# Patient Record
Sex: Female | Born: 1960 | Race: White | Hispanic: No | Marital: Married | Smoking: Never smoker
Health system: Southern US, Community
[De-identification: ages and names within clinical notes are randomized; demographics above are authoritative.]

## PROBLEM LIST (undated history)

## (undated) DIAGNOSIS — G43909 Migraine, unspecified, not intractable, without status migrainosus: Secondary | ICD-10-CM

## (undated) DIAGNOSIS — H269 Unspecified cataract: Secondary | ICD-10-CM

## (undated) DIAGNOSIS — R112 Nausea with vomiting, unspecified: Secondary | ICD-10-CM

## (undated) DIAGNOSIS — Z9889 Other specified postprocedural states: Secondary | ICD-10-CM

## (undated) DIAGNOSIS — M1990 Unspecified osteoarthritis, unspecified site: Secondary | ICD-10-CM

## (undated) DIAGNOSIS — J45909 Unspecified asthma, uncomplicated: Secondary | ICD-10-CM

## (undated) DIAGNOSIS — F32A Depression, unspecified: Secondary | ICD-10-CM

## (undated) DIAGNOSIS — Z5189 Encounter for other specified aftercare: Secondary | ICD-10-CM

## (undated) DIAGNOSIS — R4586 Emotional lability: Secondary | ICD-10-CM

## (undated) DIAGNOSIS — T7840XA Allergy, unspecified, initial encounter: Secondary | ICD-10-CM

## (undated) HISTORY — PX: APPENDECTOMY: SHX54

## (undated) HISTORY — PX: ABDOMINAL HYSTERECTOMY: SHX81

## (undated) HISTORY — DX: Unspecified asthma, uncomplicated: J45.909

## (undated) HISTORY — DX: Encounter for other specified aftercare: Z51.89

## (undated) HISTORY — DX: Unspecified cataract: H26.9

## (undated) HISTORY — PX: TOTAL ABDOMINAL HYSTERECTOMY: SHX209

## (undated) HISTORY — DX: Migraine, unspecified, not intractable, without status migrainosus: G43.909

## (undated) HISTORY — PX: TONSILLECTOMY: SUR1361

## (undated) HISTORY — PX: REPAIR ANKLE LIGAMENT: SUR1187

## (undated) HISTORY — DX: Emotional lability: R45.86

## (undated) HISTORY — DX: Allergy, unspecified, initial encounter: T78.40XA

## (undated) HISTORY — PX: ROTATOR CUFF REPAIR: SHX139

## (undated) HISTORY — PX: PARTIAL HYSTERECTOMY: SHX80

## (undated) HISTORY — DX: Depression, unspecified: F32.A

---

## 2010-11-29 HISTORY — PX: COLONOSCOPY: SHX174

## 2015-09-26 ENCOUNTER — Encounter: Payer: Self-pay | Admitting: Family Medicine

## 2015-09-26 ENCOUNTER — Ambulatory Visit (INDEPENDENT_AMBULATORY_CARE_PROVIDER_SITE_OTHER): Payer: 59 | Admitting: Family Medicine

## 2015-09-26 VITALS — BP 120/72 | HR 80 | Ht 64.0 in | Wt 185.0 lb

## 2015-09-26 DIAGNOSIS — G43909 Migraine, unspecified, not intractable, without status migrainosus: Secondary | ICD-10-CM | POA: Insufficient documentation

## 2015-09-26 DIAGNOSIS — M159 Polyosteoarthritis, unspecified: Secondary | ICD-10-CM

## 2015-09-26 DIAGNOSIS — E669 Obesity, unspecified: Secondary | ICD-10-CM | POA: Diagnosis not present

## 2015-09-26 DIAGNOSIS — G43009 Migraine without aura, not intractable, without status migrainosus: Secondary | ICD-10-CM | POA: Diagnosis not present

## 2015-09-26 DIAGNOSIS — M150 Primary generalized (osteo)arthritis: Secondary | ICD-10-CM

## 2015-09-26 DIAGNOSIS — Z803 Family history of malignant neoplasm of breast: Secondary | ICD-10-CM | POA: Diagnosis not present

## 2015-09-26 DIAGNOSIS — N951 Menopausal and female climacteric states: Secondary | ICD-10-CM

## 2015-09-26 DIAGNOSIS — J309 Allergic rhinitis, unspecified: Secondary | ICD-10-CM

## 2015-09-26 DIAGNOSIS — J4520 Mild intermittent asthma, uncomplicated: Secondary | ICD-10-CM

## 2015-09-26 DIAGNOSIS — E559 Vitamin D deficiency, unspecified: Secondary | ICD-10-CM | POA: Diagnosis not present

## 2015-09-26 DIAGNOSIS — K635 Polyp of colon: Secondary | ICD-10-CM | POA: Diagnosis not present

## 2015-09-26 DIAGNOSIS — Z1239 Encounter for other screening for malignant neoplasm of breast: Secondary | ICD-10-CM | POA: Diagnosis not present

## 2015-09-26 DIAGNOSIS — IMO0001 Reserved for inherently not codable concepts without codable children: Secondary | ICD-10-CM

## 2015-09-26 DIAGNOSIS — J3089 Other allergic rhinitis: Secondary | ICD-10-CM | POA: Insufficient documentation

## 2015-09-26 DIAGNOSIS — Z833 Family history of diabetes mellitus: Secondary | ICD-10-CM | POA: Diagnosis not present

## 2015-09-26 DIAGNOSIS — M7122 Synovial cyst of popliteal space [Baker], left knee: Secondary | ICD-10-CM | POA: Diagnosis not present

## 2015-09-26 DIAGNOSIS — Z8249 Family history of ischemic heart disease and other diseases of the circulatory system: Secondary | ICD-10-CM

## 2015-09-26 MED ORDER — MONTELUKAST SODIUM 10 MG PO TABS: 10.0000 mg | ORAL_TABLET | Freq: Every day | ORAL | Status: DC

## 2015-09-26 MED ORDER — PIRBUTEROL ACETATE 200 MCG/INH IN AERB: 2.0000 | INHALATION_SPRAY | Freq: Four times a day (QID) | RESPIRATORY_TRACT | Status: DC | PRN

## 2015-09-27 LAB — COMPREHENSIVE METABOLIC PANEL
A/G RATIO: 1.8 (ref 1.1–2.5)
ALK PHOS: 86 IU/L (ref 39–117)
ALT: 13 IU/L (ref 0–32)
AST: 12 IU/L (ref 0–40)
Albumin: 4.4 g/dL (ref 3.5–5.5)
BUN/Creatinine Ratio: 16 (ref 9–23)
BUN: 14 mg/dL (ref 6–24)
CHLORIDE: 103 mmol/L (ref 97–106)
CO2: 26 mmol/L (ref 18–29)
Calcium: 9.4 mg/dL (ref 8.7–10.2)
Creatinine, Ser: 0.85 mg/dL (ref 0.57–1.00)
GFR calc non Af Amer: 77 mL/min/{1.73_m2} (ref >59)
GFR, EST AFRICAN AMERICAN: 89 mL/min/{1.73_m2} (ref >59)
Globulin, Total: 2.4 g/dL (ref 1.5–4.5)
Glucose: 93 mg/dL (ref 65–99)
POTASSIUM: 3.9 mmol/L (ref 3.5–5.2)
Sodium: 142 mmol/L (ref 136–144)
TOTAL PROTEIN: 6.8 g/dL (ref 6.0–8.5)

## 2015-09-27 LAB — CBC
Hematocrit: 40.3 % (ref 34.0–46.6)
Hemoglobin: 13.5 g/dL (ref 11.1–15.9)
MCH: 29.3 pg (ref 26.6–33.0)
MCHC: 33.5 g/dL (ref 31.5–35.7)
MCV: 87 fL (ref 79–97)
PLATELETS: 325 10*3/uL (ref 150–379)
RBC: 4.61 x10E6/uL (ref 3.77–5.28)
RDW: 15.3 % (ref 12.3–15.4)
WBC: 8 10*3/uL (ref 3.4–10.8)

## 2015-09-27 LAB — LIPID PANEL
CHOLESTEROL TOTAL: 241 mg/dL — AB (ref 100–199)
Chol/HDL Ratio: 4.6 ratio units — ABNORMAL HIGH (ref 0.0–4.4)
HDL: 52 mg/dL (ref >39)
LDL Calculated: 130 mg/dL — ABNORMAL HIGH (ref 0–99)
Triglycerides: 296 mg/dL — ABNORMAL HIGH (ref 0–149)
VLDL Cholesterol Cal: 59 mg/dL — ABNORMAL HIGH (ref 5–40)

## 2015-09-27 LAB — VITAMIN D 25 HYDROXY (VIT D DEFICIENCY, FRACTURES): VIT D 25 HYDROXY: 26.7 ng/mL — AB (ref 30.0–100.0)

## 2015-09-27 LAB — HEMOGLOBIN A1C
ESTIMATED AVERAGE GLUCOSE: 123 mg/dL
Hgb A1c MFr Bld: 5.9 % — ABNORMAL HIGH (ref 4.8–5.6)

## 2015-09-27 LAB — TSH: TSH: 0.066 u[IU]/mL — AB (ref 0.450–4.500)

## 2015-09-29 ENCOUNTER — Telehealth: Payer: Self-pay

## 2015-09-29 DIAGNOSIS — N951 Menopausal and female climacteric states: Secondary | ICD-10-CM | POA: Insufficient documentation

## 2015-09-29 DIAGNOSIS — Z833 Family history of diabetes mellitus: Secondary | ICD-10-CM | POA: Insufficient documentation

## 2015-09-29 DIAGNOSIS — Z803 Family history of malignant neoplasm of breast: Secondary | ICD-10-CM | POA: Insufficient documentation

## 2015-09-29 DIAGNOSIS — M1990 Unspecified osteoarthritis, unspecified site: Secondary | ICD-10-CM | POA: Insufficient documentation

## 2015-09-29 MED ORDER — ALBUTEROL SULFATE HFA 108 (90 BASE) MCG/ACT IN AERS: 2.0000 | INHALATION_SPRAY | Freq: Four times a day (QID) | RESPIRATORY_TRACT | Status: DC | PRN

## 2015-09-29 NOTE — Telephone Encounter (Signed)
Albuterol MDI rx sent.

## 2015-09-29 NOTE — Addendum Note (Signed)
Addended by: Schuyler AmorPLONK, Emmamae Mcnamara on: 09/29/2015 03:32 PM   Modules accepted: Orders, Medications

## 2015-09-29 NOTE — Progress Notes (Signed)
Date:  09/26/2015   Name:  [name]   DOB:  June 18, 1961   MRN:  [id number]  PCP:  Schuyler Amor, MD    Chief Complaint: Establish Care; Asthma; and Knee Pain   History of Present Illness:  This is a 54 y.o. female with intermittent asthma worse during winter on Qvar 6 months/yr, Singulair, and prn Maxair which she uses rarely. Hx AR generally well controlled on Claritin. Hx migraines one every 3-6 months well controlled on Topamax and prn Maxalt, needs neurology referral for monitoring. On Celexa for menopausal sxs, working well. On vit D supplement for deficiency. Dx'd with flat polyp on colonoscopy 5 yrs ago, needs GI referral for repeat study per GI recommendation. Has cyst L knee x 4 months after injury that swells intermittently but causes minimal pain. Last tetanus imm 5-6 yrs ago, pnuemococcal imm last year, to get flu imm next week at work. Takes Tylenol bid for OA. Father died early 65's s/p CABG on dialysis. Uncle with DM, PGM with breast cancer, needs screening mammogram. Weight stable, exercises with dogs.  Review of Systems:  Review of Systems  Constitutional: Negative for fever and chills.  HENT: Negative for ear pain, sore throat and trouble swallowing.   Eyes: Negative for pain.  Respiratory: Negative for cough and shortness of breath.   Cardiovascular: Negative for chest pain and leg swelling.  Gastrointestinal: Negative for abdominal pain, diarrhea and constipation.  Endocrine: Negative for polyuria.  Genitourinary: Negative for difficulty urinating.  Neurological: Negative for syncope and light-headedness.    Patient Active Problem List   Diagnosis Date Noted  . Osteoarthritis 09/29/2015  . FH: breast cancer 09/29/2015  . FH: diabetes mellitus 09/29/2015  . Menopausal syndrome (hot flashes) 09/29/2015  . Obesity, Class I, BMI 30-34.9 09/26/2015  . FH: heart disease 09/26/2015  . Vitamin D deficiency 09/26/2015  . Migraines 09/26/2015  . Moderate  intermittent asthma without complication 09/26/2015  . Colon polyp 09/26/2015  . Synovial cyst of left knee 09/26/2015  . Allergic rhinitis 09/26/2015    Prior to Admission medications   Medication Sig Start Date End Date Taking? Authorizing Provider  acetaminophen (TYLENOL) 650 MG CR tablet Take 650 mg by mouth 2 (two) times daily.   Yes Historical Provider, MD  beclomethasone (QVAR) 40 MCG/ACT inhaler Inhale 1 puff into the lungs 2 (two) times daily.   Yes Historical Provider, MD  citalopram (CELEXA) 20 MG tablet Take 20 mg by mouth daily.   Yes Historical Provider, MD  loratadine (CLARITIN) 10 MG tablet Take 10 mg by mouth daily.   Yes Historical Provider, MD  montelukast (SINGULAIR) 10 MG tablet Take 1 tablet (10 mg total) by mouth at bedtime. 09/26/15  Yes Schuyler Amor, MD  Multiple Vitamins-Minerals (MULTIVITAMIN ADULTS 50+ PO) Take 1 capsule by mouth daily.   Yes Historical Provider, MD  pirbuterol (MAXAIR) 200 MCG/INH inhaler Inhale 2 puffs into the lungs 4 (four) times daily as needed for wheezing. 09/26/15  Yes Schuyler Amor, MD  rizatriptan (MAXALT-MLT) 10 MG disintegrating tablet Take 10 mg by mouth as needed for migraine. May repeat in 2 hours if needed   Yes Historical Provider, MD  topiramate (TOPAMAX) 50 MG tablet Take 50 mg by mouth 2 (two) times daily.   Yes Historical Provider, MD  Vitamin D, Cholecalciferol, 1000 UNITS CAPS Take 1 capsule by mouth daily.   Yes Historical Provider, MD    No Known Allergies  Past Surgical History  Procedure Laterality Date  . Partial  hysterectomy      one ovary left  . Appendectomy    . Tonsillectomy    . Cesarean section      x 1  . Colonoscopy  2012    due in 2017 for suspicious lesion  . Rotator cuff repair Left     shoulder  . Repair ankle ligament      from fall    Social History  Substance Use Topics  . Smoking status: Never Smoker   . Smokeless tobacco: None  . Alcohol Use: 0.0 oz/week    0 Standard drinks or  equivalent per week    History reviewed. No pertinent family history.  Medication list has been reviewed and updated.  Physical Examination: BP 120/72 mmHg  Pulse 80  Ht 5\' 4"  (1.626 m)  Wt 185 lb (83.915 kg)  BMI 31.74 kg/m2  Physical Exam  Constitutional: She appears well-developed and well-nourished.  HENT:  Head: Normocephalic and atraumatic.  Right Ear: External ear normal.  Left Ear: External ear normal.  Nose: Nose normal.  Mouth/Throat: Oropharynx is clear and moist.  Eyes: Conjunctivae and EOM are normal. Pupils are equal, round, and reactive to light.  Neck: Neck supple. No thyromegaly present.  Cardiovascular: Normal rate, regular rhythm and normal heart sounds.   Pulmonary/Chest: Effort normal and breath sounds normal.  Abdominal: Soft. She exhibits no distension and no mass. There is no tenderness.  Musculoskeletal: She exhibits no edema.  Small cystic lesion L knee lateral to patella, nontender  Lymphadenopathy:    She has no cervical adenopathy.  Neurological: She is alert. Coordination normal.  Skin: Skin is warm and dry.  Psychiatric: She has a normal mood and affect. Her behavior is normal.  Nursing note and vitals reviewed.   Assessment and Plan:  1. Moderate intermittent asthma without complication Well controlled, continue current regimen, consider pulm referral if control worsens - montelukast (SINGULAIR) 10 MG tablet; Take 1 tablet (10 mg total) by mouth at bedtime.  Dispense: 90 tablet; Refill: 3 - pirbuterol (MAXAIR) 200 MCG/INH inhaler; Inhale 2 puffs into the lungs 4 (four) times daily as needed for wheezing.  Dispense: 1 Inhaler; Refill: 2  2. Synovial cyst of left knee Reassurance, consider ortho referral if develops sign pain or disability  3. Migraine without aura and without status migrainosus, not intractable Well controlled on current regimen, requests neurology f/u - Ambulatory referral to Neurology  4. Vitamin D  deficiency Unclear control, continue supplementation - Vitamin D (25 hydroxy)  5. Allergic rhinitis, unspecified allergic rhinitis type Well controlled, continue Claritin  6. Primary osteoarthritis involving multiple joints Well controlled, continue Tylenol bid  7. Obesity, Class I, BMI 30-34.9 Discussed exercise/weight loss - Comprehensive metabolic panel - CBC - Lipid Profile - TSH - HgB A1c  8. Colon polyp On colonoscopy 5 yrs ago - Ambulatory referral to Gastroenterology  9. Menopausal syndrome (hot flashes) Well controlled, continue Celexa  10. FH: heart disease  11. FH: breast cancer  12. FH: diabetes mellitus  13. Breast cancer screening - MM Digital Screening; Future  Return in about 4 weeks (around 10/24/2015).  Dionne AnoWilliam M. Kingsley SpittlePlonk, Jr. MD [facility]  09/29/2015

## 2015-09-30 ENCOUNTER — Encounter: Payer: Self-pay | Admitting: Family Medicine

## 2015-09-30 DIAGNOSIS — R7989 Other specified abnormal findings of blood chemistry: Secondary | ICD-10-CM | POA: Insufficient documentation

## 2015-09-30 LAB — T3: T3, Total: 112 ng/dL (ref 71–180)

## 2015-09-30 LAB — SPECIMEN STATUS REPORT

## 2015-09-30 LAB — T4, FREE: Free T4: 0.95 ng/dL (ref 0.82–1.77)

## 2015-10-01 ENCOUNTER — Other Ambulatory Visit: Payer: Self-pay

## 2015-10-13 ENCOUNTER — Telehealth: Payer: Self-pay

## 2015-10-13 MED ORDER — CITALOPRAM HYDROBROMIDE 20 MG PO TABS: 20.0000 mg | ORAL_TABLET | Freq: Every day | ORAL | Status: DC

## 2015-10-13 NOTE — Telephone Encounter (Signed)
Sent message to Plonk 

## 2015-10-13 NOTE — Addendum Note (Signed)
Addended by: Schuyler AmorPLONK, Emily Massar on: 10/13/2015 04:28 PM   Modules accepted: Orders

## 2015-10-16 ENCOUNTER — Telehealth: Payer: Self-pay | Admitting: Gastroenterology

## 2015-10-16 NOTE — Telephone Encounter (Signed)
Patient left voice mail that she isn't due for a colonoscopy until August 2017. She will call back to schedule.

## 2015-11-07 ENCOUNTER — Encounter: Payer: Self-pay | Admitting: Family Medicine

## 2015-11-07 ENCOUNTER — Ambulatory Visit (INDEPENDENT_AMBULATORY_CARE_PROVIDER_SITE_OTHER): Payer: 59 | Admitting: Family Medicine

## 2015-11-07 VITALS — BP 152/70 | HR 84 | Ht 64.0 in | Wt 182.7 lb

## 2015-11-07 DIAGNOSIS — E559 Vitamin D deficiency, unspecified: Secondary | ICD-10-CM | POA: Diagnosis not present

## 2015-11-07 DIAGNOSIS — E782 Mixed hyperlipidemia: Secondary | ICD-10-CM | POA: Insufficient documentation

## 2015-11-07 DIAGNOSIS — R7303 Prediabetes: Secondary | ICD-10-CM | POA: Insufficient documentation

## 2015-11-07 DIAGNOSIS — E669 Obesity, unspecified: Secondary | ICD-10-CM | POA: Diagnosis not present

## 2015-11-07 DIAGNOSIS — J069 Acute upper respiratory infection, unspecified: Secondary | ICD-10-CM | POA: Diagnosis not present

## 2015-11-07 DIAGNOSIS — E785 Hyperlipidemia, unspecified: Secondary | ICD-10-CM | POA: Diagnosis not present

## 2015-11-07 DIAGNOSIS — B9789 Other viral agents as the cause of diseases classified elsewhere: Principal | ICD-10-CM

## 2015-11-07 NOTE — Progress Notes (Signed)
Date:  11/07/2015   Name:  [name]   DOB:  08/30/1961   MRN:  [id number]  PCP:  Schuyler AmorWilliam Stefani Baik, MD    Chief Complaint: No chief complaint on file.   History of Present Illness:  This is a 54 y.o. female for f/u prediabetes, vit D deg, and obesity. Has lost 3# past month, eating better, exercising more. Has had 8d of sinus congestion and NP cough, L ear a little painful after flying. Increased vit D to 2000 units daily.  Review of Systems:  Review of Systems  Constitutional: Negative for fever and chills.  HENT: Negative for sore throat and trouble swallowing.   Respiratory: Negative for shortness of breath and wheezing.     Patient Active Problem List   Diagnosis Date Noted  . Prediabetes 11/07/2015  . Low serum thyroid stimulating hormone (TSH) 09/30/2015  . Osteoarthritis 09/29/2015  . FH: breast cancer 09/29/2015  . FH: diabetes mellitus 09/29/2015  . Menopausal syndrome (hot flashes) 09/29/2015  . Obesity, Class I, BMI 30-34.9 09/26/2015  . FH: heart disease 09/26/2015  . Vitamin D deficiency 09/26/2015  . Migraines 09/26/2015  . Moderate intermittent asthma without complication 09/26/2015  . Colon polyp 09/26/2015  . Synovial cyst of left knee 09/26/2015  . Allergic rhinitis 09/26/2015    Prior to Admission medications   Medication Sig Start Date End Date Taking? Authorizing Provider  acetaminophen (TYLENOL) 650 MG CR tablet Take 650 mg by mouth 2 (two) times daily.   Yes Historical Provider, MD  albuterol (PROVENTIL HFA;VENTOLIN HFA) 108 (90 BASE) MCG/ACT inhaler Inhale 2 puffs into the lungs every 6 (six) hours as needed for wheezing or shortness of breath. 09/29/15  Yes Schuyler AmorWilliam Rosmary Dionisio, MD  beclomethasone (QVAR) 40 MCG/ACT inhaler Inhale 1 puff into the lungs 2 (two) times daily.   Yes Historical Provider, MD  citalopram (CELEXA) 20 MG tablet Take 1 tablet (20 mg total) by mouth daily. 10/13/15  Yes Schuyler AmorWilliam Vesta Wheeland, MD  loratadine (CLARITIN) 10 MG tablet Take 10  mg by mouth daily.   Yes Historical Provider, MD  montelukast (SINGULAIR) 10 MG tablet Take 1 tablet (10 mg total) by mouth at bedtime. 09/26/15  Yes Schuyler AmorWilliam Ashlen Kiger, MD  Multiple Vitamins-Minerals (MULTIVITAMIN ADULTS 50+ PO) Take 1 capsule by mouth daily.   Yes Historical Provider, MD  rizatriptan (MAXALT-MLT) 10 MG disintegrating tablet Take 10 mg by mouth as needed for migraine. May repeat in 2 hours if needed   Yes Historical Provider, MD  topiramate (TOPAMAX) 50 MG tablet Take 50 mg by mouth 2 (two) times daily.   Yes Historical Provider, MD  Vitamin D, Cholecalciferol, 1000 UNITS CAPS Take 1 capsule by mouth daily.   Yes Historical Provider, MD    No Known Allergies  Past Surgical History  Procedure Laterality Date  . Partial hysterectomy      one ovary left  . Appendectomy    . Tonsillectomy    . Cesarean section      x 1  . Colonoscopy  2012    due in 2017 for suspicious lesion  . Rotator cuff repair Left     shoulder  . Repair ankle ligament      from fall    Social History  Substance Use Topics  . Smoking status: Never Smoker   . Smokeless tobacco: None  . Alcohol Use: 0.0 oz/week    0 Standard drinks or equivalent per week    No family history on file.  Medication list has  been reviewed and updated.  Physical Examination: BP 152/70 mmHg  Pulse 84  Ht  (1.626 m)  Wt 182 lb 11.2 oz (82.872 kg)  BMI 31.34 kg/m2  Physical Exam  Constitutional: She appears well-developed and well-nourished.  HENT:  Right Ear: External ear normal.  Left Ear: External ear normal.  Mouth/Throat: Oropharynx is clear and moist.  L TM sl retracted Mild B max sinus tenderness  Pulmonary/Chest: Effort normal and breath sounds normal.  Musculoskeletal: She exhibits no edema.  Neurological: She is alert.  Skin: Skin is warm and dry.  Psychiatric: She has a normal mood and affect. Her behavior is normal.  Nursing note and vitals reviewed.   Assessment and Plan:  1. Viral  URI with cough Sx rx discussed, call if sxs worsen/persist  2. Vitamin D deficiency On supplement, recheck level next visit  3. Prediabetes Continue NCS diet, exercise, recheck a1c next visit  4. Obesity, Class I, BMI 30-34.9 Cont weight loss   No Follow-up on file.  Dionne Ano. Kingsley Spittle MD [facility]  11/07/2015

## 2015-11-10 ENCOUNTER — Ambulatory Visit: Payer: 59 | Admitting: Family Medicine

## 2016-01-23 ENCOUNTER — Ambulatory Visit: Payer: 59 | Admitting: Family Medicine

## 2016-01-26 ENCOUNTER — Other Ambulatory Visit: Payer: Self-pay | Admitting: Family Medicine

## 2016-01-26 DIAGNOSIS — IMO0001 Reserved for inherently not codable concepts without codable children: Secondary | ICD-10-CM

## 2016-01-26 DIAGNOSIS — J4520 Mild intermittent asthma, uncomplicated: Secondary | ICD-10-CM

## 2016-01-26 MED ORDER — MONTELUKAST SODIUM 10 MG PO TABS: 10.0000 mg | ORAL_TABLET | Freq: Every day | ORAL | Status: DC

## 2016-06-17 ENCOUNTER — Other Ambulatory Visit: Payer: Self-pay | Admitting: Family Medicine

## 2016-07-13 ENCOUNTER — Ambulatory Visit (INDEPENDENT_AMBULATORY_CARE_PROVIDER_SITE_OTHER): Payer: 59 | Admitting: Internal Medicine

## 2016-07-13 ENCOUNTER — Other Ambulatory Visit: Payer: Self-pay

## 2016-07-13 ENCOUNTER — Encounter: Payer: Self-pay | Admitting: Internal Medicine

## 2016-07-13 ENCOUNTER — Telehealth: Payer: Self-pay

## 2016-07-13 VITALS — BP 124/80 | HR 78 | Resp 16 | Ht 64.0 in | Wt 179.0 lb

## 2016-07-13 DIAGNOSIS — F39 Unspecified mood [affective] disorder: Secondary | ICD-10-CM | POA: Diagnosis not present

## 2016-07-13 DIAGNOSIS — R7303 Prediabetes: Secondary | ICD-10-CM | POA: Diagnosis not present

## 2016-07-13 DIAGNOSIS — Z1239 Encounter for other screening for malignant neoplasm of breast: Secondary | ICD-10-CM

## 2016-07-13 DIAGNOSIS — M79672 Pain in left foot: Secondary | ICD-10-CM | POA: Insufficient documentation

## 2016-07-13 DIAGNOSIS — G43009 Migraine without aura, not intractable, without status migrainosus: Secondary | ICD-10-CM | POA: Diagnosis not present

## 2016-07-13 NOTE — Telephone Encounter (Signed)
Screening Colonoscopy Z12.11 [facility] 08/13/16 Please pre cert  

## 2016-07-13 NOTE — Patient Instructions (Signed)
Dr. Gwyneth RevelsJustin Fowler - podiatry.  [facility])

## 2016-07-13 NOTE — Progress Notes (Signed)
Date:  07/13/2016   Name:  [name]   DOB:  11/14/1961   MRN:  [id number]   Chief Complaint: Diabetes (A1c) Diabetes  She presents for her follow-up diabetic visit. Her disease course has been stable. Pertinent negatives for hypoglycemia include no headaches or tremors. Pertinent negatives for diabetes include no chest pain, no fatigue, no polydipsia and no polyuria. Current diabetic treatment includes diet.  Migraine   This is a recurrent (sees Dr. Malvin JohnsPotter) problem. The current episode started more than 1 year ago. The problem occurs intermittently. The problem has been unchanged. Pertinent negatives include no abdominal pain, coughing, fever, numbness or tinnitus. She has tried triptans (and topamax for prevention) for the symptoms. The treatment provided significant relief.   Mood disorder - started at menopause.  Taking Celexa and doing very well.    HM - [place] and also due for colonoscopy.  Referral done to Dr. Servando SnareWohl - patient just needs to call them to schedule.  Foot pain - nodule on side of left foot.  Painful to walk and to touch.  Lab Results  Component Value Date   HGBA1C 5.9 (H) 09/26/2015    Review of Systems  Constitutional: Negative for appetite change, fatigue, fever and unexpected weight change.  HENT: Negative for tinnitus and trouble swallowing.   Eyes: Negative for visual disturbance.  Respiratory: Negative for cough, chest tightness and shortness of breath.   Cardiovascular: Negative for chest pain, palpitations and leg swelling.  Gastrointestinal: Negative for abdominal pain.  Endocrine: Negative for polydipsia and polyuria.  Genitourinary: Negative for dysuria and hematuria.  Musculoskeletal: Positive for arthralgias (left foot pain).  Neurological: Negative for tremors, numbness and headaches.  Psychiatric/Behavioral: Negative for dysphoric mood.    Patient Active Problem List   Diagnosis Date Noted  . Prediabetes  11/07/2015  . Hyperlipidemia 11/07/2015  . Low serum thyroid stimulating hormone (TSH) 09/30/2015  . Osteoarthritis 09/29/2015  . Menopausal syndrome (hot flashes) 09/29/2015  . Obesity, Class I, BMI 30-34.9 09/26/2015  . Vitamin D deficiency 09/26/2015  . Headache, migraine 09/26/2015  . Moderate intermittent asthma without complication 09/26/2015  . Colon polyp 09/26/2015  . Synovial cyst of left knee 09/26/2015  . Allergic rhinitis 09/26/2015    Prior to Admission medications   Medication Sig Start Date End Date Taking? Authorizing Provider  rizatriptan (MAXALT-MLT) 10 MG disintegrating tablet Take 1 tablet by mouth at headache onset may repeat once in 2 hours if needed.  No more than 2 tabs in 24 hours 02/19/16  Yes Historical Provider, MD  topiramate (TOPAMAX) 50 MG tablet Take by mouth. 02/19/16  Yes Historical Provider, MD  acetaminophen (TYLENOL) 650 MG CR tablet Take 650 mg by mouth 2 (two) times daily.    Historical Provider, MD  albuterol (PROVENTIL HFA;VENTOLIN HFA) 108 (90 BASE) MCG/ACT inhaler Inhale 2 puffs into the lungs every 6 (six) hours as needed for wheezing or shortness of breath. 09/29/15   Schuyler AmorWilliam Plonk, MD  beclomethasone (QVAR) 40 MCG/ACT inhaler Inhale 1 puff into the lungs 2 (two) times daily.    Historical Provider, MD  citalopram (CELEXA) 20 MG tablet TAKE ONE TABLET BY MOUTH ONCE DAILY 06/17/16   Reubin MilanLaura H Faria Casella, MD  loratadine (CLARITIN) 10 MG tablet Take 10 mg by mouth daily.    Historical Provider, MD  montelukast (SINGULAIR) 10 MG tablet Take 1 tablet (10 mg total) by mouth at bedtime. 01/26/16   Schuyler AmorWilliam Plonk, MD  Multiple Vitamins-Minerals (  MULTIVITAMIN ADULTS 50+ PO) Take 1 capsule by mouth daily.    Historical Provider, MD  Vitamin D, Cholecalciferol, 1000 UNITS CAPS Take 2 capsules by mouth daily.    Historical Provider, MD    No Known Allergies  Past Surgical History:  Procedure Laterality Date  . APPENDECTOMY    . CESAREAN SECTION     x 1  .  COLONOSCOPY  2012   due in 2017 for suspicious lesion  . PARTIAL HYSTERECTOMY     one ovary left  . REPAIR ANKLE LIGAMENT     from fall  . ROTATOR CUFF REPAIR Left    shoulder  . TONSILLECTOMY      Social History  Substance Use Topics  . Smoking status: Never Smoker  . Smokeless tobacco: Never Used  . Alcohol use 0.0 oz/week     Medication list has been reviewed and updated.   Physical Exam  Constitutional: She is oriented to person, place, and time. She appears well-developed. No distress.  HENT:  Head: Normocephalic and atraumatic.  Neck: Normal range of motion. Neck supple.  Cardiovascular: Normal rate, regular rhythm and normal heart sounds.   Pulmonary/Chest: Effort normal and breath sounds normal. No respiratory distress. She has no wheezes. She has no rales.  Musculoskeletal: Normal range of motion. She exhibits no edema.       Feet:  Neurological: She is alert and oriented to person, place, and time. She has normal reflexes.  Skin: Skin is warm and dry. No rash noted.  Psychiatric: She has a normal mood and affect. Her behavior is normal. Thought content normal.  Nursing note and vitals reviewed.   BP 124/80 (BP Location: Right Arm, Patient Position: Sitting, Cuff Size: Normal)   Pulse 78   Resp 16   Ht 5\' 4"  (1.626 m)   Wt 179 lb (81.2 kg)   SpO2 98%   BMI 30.73 kg/m   Assessment and Plan: 1. Prediabetes Continue diet and weight loss - Hemoglobin A1c  2. Nonintractable migraine, unspecified migraine type Followed by Neurology  3. Mood disorder (HCC) Doing well on Celexa  4. [facility] MM DIGITAL SCREENING BILATERAL; Future  5. Colon Cancer Screening Referral to Dr. Servando SnareWohl has already been done - she can call to schedule  6. Foot pain,left Recommend Podiatry  Bari EdwardLaura Henrry Feil, MD [facility]  07/13/2016

## 2016-07-13 NOTE — Telephone Encounter (Signed)
Gastroenterology Pre-Procedure Review  Request Date: 08/13/2016 Requesting Physician: Dr. Hollace HaywardPlonk  PATIENT REVIEW QUESTIONS: The patient responded to the following health history questions as indicated:    1. Are you having any GI issues? no 2. Do you have a personal history of Polyps? yes (pre cancerous adhesion ) 3. Do you have a family history of Colon Cancer or Polyps? no 4. Diabetes Mellitus? no 5. Joint replacements in the past 12 months?no 6. Major health problems in the past 3 months?no 7. Any artificial heart valves, MVP, or defibrillator?no    MEDICATIONS & ALLERGIES:    Patient reports the following regarding taking any anticoagulation/antiplatelet therapy:   Plavix, Coumadin, Eliquis, Xarelto, Lovenox, Pradaxa, Brilinta, or Effient? no Aspirin? no  Patient confirms/reports the following medications:  Current Outpatient Prescriptions  Medication Sig Dispense Refill  . acetaminophen (TYLENOL) 650 MG CR tablet Take 650 mg by mouth 2 (two) times daily.    Marland Kitchen. albuterol (PROVENTIL HFA;VENTOLIN HFA) 108 (90 BASE) MCG/ACT inhaler Inhale 2 puffs into the lungs every 6 (six) hours as needed for wheezing or shortness of breath. 1 Inhaler 0  . beclomethasone (QVAR) 40 MCG/ACT inhaler Inhale 1 puff into the lungs 2 (two) times daily.    . citalopram (CELEXA) 20 MG tablet TAKE ONE TABLET BY MOUTH ONCE DAILY 90 tablet 0  . loratadine (CLARITIN) 10 MG tablet Take 10 mg by mouth daily.    . montelukast (SINGULAIR) 10 MG tablet Take 1 tablet (10 mg total) by mouth at bedtime. 90 tablet 3  . Multiple Vitamins-Minerals (MULTIVITAMIN ADULTS 50+ PO) Take 1 capsule by mouth daily.    . rizatriptan (MAXALT-MLT) 10 MG disintegrating tablet Take 1 tablet by mouth at headache onset may repeat once in 2 hours if needed.  No more than 2 tabs in 24 hours    . topiramate (TOPAMAX) 50 MG tablet Take by mouth.    . Vitamin D, Cholecalciferol, 1000 UNITS CAPS Take 2 capsules by mouth daily.     No current  facility-administered medications for this visit.     Patient confirms/reports the following allergies:  No Known Allergies  No orders of the defined types were placed in this encounter.   AUTHORIZATION INFORMATION Primary Insurance: 1D#: Group #:  Secondary Insurance: 1D#: Group #:  SCHEDULE INFORMATION: Date: 08/13/2016 Time: Location: [facility]

## 2016-07-14 LAB — HEMOGLOBIN A1C
Est. average glucose Bld gHb Est-mCnc: 120 mg/dL
HEMOGLOBIN A1C: 5.8 % — AB (ref 4.8–5.6)

## 2016-07-18 ENCOUNTER — Encounter: Payer: Self-pay | Admitting: Internal Medicine

## 2016-07-19 ENCOUNTER — Other Ambulatory Visit: Payer: Self-pay | Admitting: *Deleted

## 2016-07-19 ENCOUNTER — Inpatient Hospital Stay
Admission: RE | Admit: 2016-07-19 | Discharge: 2016-07-19 | Disposition: A | Discharge status: Home / Self Care | Payer: Self-pay | Source: Ambulatory Visit | Attending: *Deleted | Admitting: *Deleted

## 2016-07-19 DIAGNOSIS — Z9289 Personal history of other medical treatment: Secondary | ICD-10-CM

## 2016-08-06 NOTE — Discharge Instructions (Signed)

## 2016-08-16 ENCOUNTER — Encounter: Payer: Self-pay | Admitting: *Deleted

## 2016-08-18 ENCOUNTER — Other Ambulatory Visit: Payer: Self-pay | Admitting: Internal Medicine

## 2016-08-18 ENCOUNTER — Encounter: Payer: Self-pay | Admitting: Internal Medicine

## 2016-08-18 DIAGNOSIS — J4520 Mild intermittent asthma, uncomplicated: Secondary | ICD-10-CM

## 2016-08-18 DIAGNOSIS — IMO0001 Reserved for inherently not codable concepts without codable children: Secondary | ICD-10-CM

## 2016-08-18 MED ORDER — MONTELUKAST SODIUM 10 MG PO TABS: 10.0000 mg | ORAL_TABLET | Freq: Every day | ORAL | 3 refills | Status: DC

## 2016-08-20 ENCOUNTER — Encounter: Payer: Self-pay | Admitting: Anesthesiology

## 2016-08-20 ENCOUNTER — Encounter
Admission: RE | Disposition: A | Discharge status: Home / Self Care | Payer: Self-pay | Source: Ambulatory Visit | Attending: Gastroenterology

## 2016-08-20 ENCOUNTER — Ambulatory Visit: Payer: 59 | Admitting: Anesthesiology

## 2016-08-20 ENCOUNTER — Ambulatory Visit
Admission: RE | Admit: 2016-08-20 | Discharge: 2016-08-20 | Disposition: A | Discharge status: Home / Self Care | Payer: 59 | Source: Ambulatory Visit | Attending: Gastroenterology | Admitting: Gastroenterology

## 2016-08-20 DIAGNOSIS — Z79899 Other long term (current) drug therapy: Secondary | ICD-10-CM | POA: Diagnosis not present

## 2016-08-20 DIAGNOSIS — Z860100 Personal history of colon polyps, unspecified: Secondary | ICD-10-CM

## 2016-08-20 DIAGNOSIS — Z1211 Encounter for screening for malignant neoplasm of colon: Secondary | ICD-10-CM | POA: Diagnosis not present

## 2016-08-20 DIAGNOSIS — J45909 Unspecified asthma, uncomplicated: Secondary | ICD-10-CM | POA: Diagnosis not present

## 2016-08-20 DIAGNOSIS — K641 Second degree hemorrhoids: Secondary | ICD-10-CM | POA: Insufficient documentation

## 2016-08-20 DIAGNOSIS — Z7951 Long term (current) use of inhaled steroids: Secondary | ICD-10-CM | POA: Diagnosis not present

## 2016-08-20 DIAGNOSIS — G43909 Migraine, unspecified, not intractable, without status migrainosus: Secondary | ICD-10-CM | POA: Insufficient documentation

## 2016-08-20 HISTORY — DX: Other specified postprocedural states: R11.2

## 2016-08-20 HISTORY — PX: COLONOSCOPY WITH PROPOFOL: SHX5780

## 2016-08-20 HISTORY — DX: Unspecified osteoarthritis, unspecified site: M19.90

## 2016-08-20 HISTORY — DX: Other specified postprocedural states: Z98.890

## 2016-08-20 SURGERY — COLONOSCOPY WITH PROPOFOL
Anesthesia: Monitor Anesthesia Care | Wound class: Contaminated

## 2016-08-20 MED ORDER — LIDOCAINE HCL (CARDIAC) 20 MG/ML IV SOLN
INTRAVENOUS | Status: DC | PRN
  Administered 2016-08-20: 30 mg via INTRAVENOUS

## 2016-08-20 MED ORDER — LACTATED RINGERS IV SOLN
INTRAVENOUS | Status: DC
  Administered 2016-08-20: 10:00:00 via INTRAVENOUS

## 2016-08-20 MED ORDER — PROPOFOL 10 MG/ML IV BOLUS
INTRAVENOUS | Status: DC | PRN
  Administered 2016-08-20 (×4): 50 mg via INTRAVENOUS

## 2016-08-20 MED ORDER — STERILE WATER FOR IRRIGATION IR SOLN
Status: DC | PRN
  Administered 2016-08-20: 11:00:00

## 2016-08-20 SURGICAL SUPPLY — 23 items

## 2016-08-20 NOTE — Anesthesia Postprocedure Evaluation (Signed)
Anesthesia Post Note  Patient: [name]  Procedure(s) Performed: Procedure(s) (LRB): COLONOSCOPY WITH PROPOFOL (N/A)  Patient location during evaluation: PACU Anesthesia Type: MAC Level of consciousness: awake and alert Pain management: pain level controlled Vital Signs Assessment: post-procedure vital signs reviewed and stable Respiratory status: spontaneous breathing, nonlabored ventilation, respiratory function stable and patient connected to nasal cannula oxygen Cardiovascular status: stable and blood pressure returned to baseline Anesthetic complications: no    [name]

## 2016-08-20 NOTE — Transfer of Care (Signed)
Immediate Anesthesia Transfer of Care Note  Patient: [name]  Procedure(s) Performed: Procedure(s): COLONOSCOPY WITH PROPOFOL (N/A)  Patient Location: PACU  Anesthesia Type: MAC  Level of Consciousness: awake, alert  and patient cooperative  Airway and Oxygen Therapy: Patient Spontanous Breathing and Patient connected to supplemental oxygen  Post-op Assessment: Post-op Vital signs reviewed, Patient's Cardiovascular Status Stable, Respiratory Function Stable, Patent Airway and No signs of Nausea or vomiting  Post-op Vital Signs: Reviewed and stable  Complications: No apparent anesthesia complications

## 2016-08-20 NOTE — H&P (Signed)
Midge Minium, MD [facility] [address] [place], [place] [address] Phone: [phone] Fax : [phone]  Primary Care Physician:  Schuyler Amor, MD Primary Gastroenterologist:  Dr. Servando Snare  Pre-Procedure History & Physical: HPI:  [name] is a 55 y.o. female is here for an colonoscopy.   Past Medical History:  Diagnosis Date  . Arthritis    hands, feet, knees  . Asthma   . Migraine   . Mood change (HCC)    due to menopause/ hot flashes  . PONV (postoperative nausea and vomiting)     Past Surgical History:  Procedure Laterality Date  . APPENDECTOMY    . CESAREAN SECTION     x 1  . COLONOSCOPY  2012   due in 2017 for suspicious lesion  . PARTIAL HYSTERECTOMY     one ovary left  . REPAIR ANKLE LIGAMENT     from fall  . ROTATOR CUFF REPAIR Left    shoulder  . TONSILLECTOMY      Prior to Admission medications   Medication Sig Start Date End Date Taking? Authorizing Provider  acetaminophen (TYLENOL) 650 MG CR tablet Take 650 mg by mouth 2 (two) times daily.   Yes Historical Provider, MD  albuterol (PROVENTIL HFA;VENTOLIN HFA) 108 (90 BASE) MCG/ACT inhaler Inhale 2 puffs into the lungs every 6 (six) hours as needed for wheezing or shortness of breath. 09/29/15  Yes Schuyler Amor, MD  beclomethasone (QVAR) 40 MCG/ACT inhaler Inhale 1 puff into the lungs 2 (two) times daily.   Yes Historical Provider, MD  citalopram (CELEXA) 20 MG tablet TAKE ONE TABLET BY MOUTH ONCE DAILY 06/17/16  Yes Reubin Milan, MD  loratadine (CLARITIN) 10 MG tablet Take 10 mg by mouth daily.   Yes Historical Provider, MD  Multiple Vitamins-Minerals (MULTIVITAMIN ADULTS 50+ PO) Take 1 capsule by mouth daily.   Yes Historical Provider, MD  rizatriptan (MAXALT-MLT) 10 MG disintegrating tablet Take 1 tablet by mouth at headache onset may repeat once in 2 hours if needed.  No more than 2 tabs in 24 hours 02/19/16  Yes Historical Provider, MD  topiramate (TOPAMAX) 50 MG tablet Take by mouth.  02/19/16  Yes Historical Provider, MD  Vitamin D, Cholecalciferol, 1000 UNITS CAPS Take 2 capsules by mouth daily.   Yes Historical Provider, MD  montelukast (SINGULAIR) 10 MG tablet Take 1 tablet (10 mg total) by mouth at bedtime. Patient not taking: Reported on 08/20/2016 08/18/16   Reubin Milan, MD    Allergies as of 07/13/2016  . (No Known Allergies)    Family History  Problem Relation Age of Onset  . Diabetes Paternal Uncle     Social History   Social History  . Marital status: Married    Spouse name: N/A  . Number of children: N/A  . Years of education: N/A   Occupational History  . Not on file.   Social History Main Topics  . Smoking status: Never Smoker  . Smokeless tobacco: Never Used  . Alcohol use 0.0 oz/week     Comment: 2 drinks - 2x/mo  . Drug use: No  . Sexual activity: Not on file   Other Topics Concern  . Not on file   Social History Narrative  . No narrative on file    Review of Systems: See HPI, otherwise negative ROS  Physical Exam: BP 123/73   Pulse 74   Temp 98.1 F (36.7 C)   Resp 16   Ht 5\' 4"  (1.626 m)  Wt 180 lb (81.6 kg)   SpO2 99%   BMI 30.90 kg/m  General:   Alert,  pleasant and cooperative in NAD Head:  Normocephalic and atraumatic. Neck:  Supple; no masses or thyromegaly. Lungs:  Clear throughout to auscultation.    Heart:  Regular rate and rhythm. Abdomen:  Soft, nontender and nondistended. Normal bowel sounds, without guarding, and without rebound.   Neurologic:  Alert and  oriented x4;  grossly normal neurologically.  Impression/Plan: [name] is here for an colonoscopy to be performed for history of colon polyps  Risks, benefits, limitations, and alternatives regarding  colonoscopy have been reviewed with the patient.  Questions have been answered.  All parties agreeable.   Midge Miniumarren Blimi Godby, MD  08/20/2016, 10:27 AM

## 2016-08-20 NOTE — Anesthesia Preprocedure Evaluation (Signed)
Anesthesia Evaluation  Patient identified by MRN, date of birth, ID band  Reviewed: NPO status   History of Anesthesia Complications (+) PONV and history of anesthetic complications  Airway Mallampati: II  TM Distance: >3 FB Neck ROM: full    Dental no notable dental hx.    Pulmonary asthma ,    Pulmonary exam normal        Cardiovascular Exercise Tolerance: Good negative cardio ROS Normal cardiovascular exam     Neuro/Psych  Headaches, Anxiety Mood d/o   GI/Hepatic negative GI ROS, Neg liver ROS,   Endo/Other  negative endocrine ROS  Renal/GU negative Renal ROS  negative genitourinary   Musculoskeletal  (+) Arthritis ,   Abdominal   Peds  Hematology negative hematology ROS (+)   Anesthesia Other Findings   Reproductive/Obstetrics                             Anesthesia Physical Anesthesia Plan  ASA: II  Anesthesia Plan: MAC   Post-op Pain Management:    Induction:   Airway Management Planned:   Additional Equipment:   Intra-op Plan:   Post-operative Plan:   Informed Consent: I have reviewed the patients History and Physical, chart, labs and discussed the procedure including the risks, benefits and alternatives for the proposed anesthesia with the patient or authorized representative who has indicated his/her understanding and acceptance.     Plan Discussed with: CRNA  Anesthesia Plan Comments:         Anesthesia Quick Evaluation

## 2016-08-20 NOTE — Op Note (Signed)
[facility] Gastroenterology Patient Name: [name] Procedure Date: 08/20/2016 10:51 AM MRN: [id number] Account #: [id number] Date of Birth: [birth year] Admit Type: Outpatient Age: 55 Room: [facility] OR ROOM 01 Gender: Female Note Status: Finalized Procedure:            Colonoscopy Indications:          High risk colon cancer surveillance: Personal history                        of colonic polyps Providers:            Midge Minium MD, MD Referring MD:         Dionne Ano. Plonk, MD (Referring MD) Medicines:            Propofol per Anesthesia Complications:        No immediate complications. Procedure:            Pre-Anesthesia Assessment:                       - Prior to the procedure, a History and Physical was                        performed, and patient medications and allergies were                        reviewed. The patient's tolerance of previous                        anesthesia was also reviewed. The risks and benefits of                        the procedure and the sedation options and risks were                        discussed with the patient. All questions were                        answered, and informed consent was obtained. Prior                        Anticoagulants: The patient has taken no previous                        anticoagulant or antiplatelet agents. ASA Grade                        Assessment: II - A patient with mild systemic disease.                        After reviewing the risks and benefits, the patient was                        deemed in satisfactory condition to undergo the                        procedure.                       After obtaining informed consent, the colonoscope was  passed under direct vision. Throughout the procedure,                        the patient's blood pressure, pulse, and oxygen                        saturations were monitored continuously. The Olympus CF    H180AL colonoscope (S#: [id number]) was introduced through                        the anus and advanced to the the cecum, identified by                        appendiceal orifice and ileocecal valve. The                        colonoscopy was performed without difficulty. The                        patient tolerated the procedure well. The quality of                        the bowel preparation was excellent. Findings:      The perianal and digital rectal examinations were normal.      Non-bleeding internal hemorrhoids were found during retroflexion. The       hemorrhoids were Grade II (internal hemorrhoids that prolapse but reduce       spontaneously). Impression:           - Non-bleeding internal hemorrhoids.                       - No specimens collected. Recommendation:       - Discharge patient to home.                       - Resume previous diet.                       - Continue present medications. Procedure Code(s):    --- Professional ---                       [id number], Colonoscopy, flexible; diagnostic, including                        collection of specimen(s) by brushing or washing, when                        performed (separate procedure) Diagnosis Code(s):    --- Professional ---                       Z86.010, Personal history of colonic polyps CPT copyright 2016 American Medical Association. All rights reserved. The codes documented in this report are preliminary and upon coder review may  be revised to meet current compliance requirements. Midge Miniumarren Katherine Tout MD, MD 08/20/2016 11:13:08 AM This report has been signed electronically. Number of Addenda: 0 Note Initiated On: 08/20/2016 10:51 AM Scope Withdrawal Time: 0 hours 5 minutes 56 seconds  Total Procedure Duration: 0 hours 11 minutes 29 seconds       [facility]

## 2016-08-23 ENCOUNTER — Ambulatory Visit
Admission: RE | Admit: 2016-08-23 | Discharge: 2016-08-23 | Disposition: A | Discharge status: Home / Self Care | Payer: 59 | Source: Ambulatory Visit | Attending: Internal Medicine | Admitting: Internal Medicine

## 2016-08-23 ENCOUNTER — Other Ambulatory Visit: Payer: Self-pay | Admitting: Internal Medicine

## 2016-08-23 DIAGNOSIS — Z1231 Encounter for screening mammogram for malignant neoplasm of breast: Secondary | ICD-10-CM | POA: Diagnosis not present

## 2016-08-23 DIAGNOSIS — Z1239 Encounter for other screening for malignant neoplasm of breast: Secondary | ICD-10-CM

## 2016-08-26 ENCOUNTER — Encounter: Payer: Self-pay | Admitting: Internal Medicine

## 2016-09-06 ENCOUNTER — Encounter: Payer: Self-pay | Admitting: Internal Medicine

## 2016-09-13 ENCOUNTER — Encounter: Payer: Self-pay | Admitting: Emergency Medicine

## 2016-09-13 ENCOUNTER — Ambulatory Visit
Admission: EM | Admit: 2016-09-13 | Discharge: 2016-09-13 | Disposition: A | Discharge status: Home / Self Care | Payer: 59 | Attending: Family Medicine | Admitting: Family Medicine

## 2016-09-13 DIAGNOSIS — J069 Acute upper respiratory infection, unspecified: Secondary | ICD-10-CM | POA: Diagnosis not present

## 2016-09-13 MED ORDER — AZITHROMYCIN 250 MG PO TABS
250.0000 mg | ORAL_TABLET | Freq: Every day | ORAL | 0 refills | Status: DC
Start: 2016-09-13 — End: 2017-01-17

## 2016-09-13 NOTE — ED Provider Notes (Signed)
CSN: [id number]     Arrival date & time 09/13/16  1016 History   First MD Initiated Contact with Patient 09/13/16 1148     Chief Complaint  Patient presents with  . Cough  . Nasal Congestion   (Consider location/radiation/quality/duration/timing/severity/associated sxs/prior Treatment) HPI  [name] who presents with cough and chest congestion and a runny nose for the past week. States it started off with a sore throat but has settled in her sinuses and her chest. She has a productive cough and draining sinuses with yellow-green mucus production. His had low-grade fevers but no high fevers at all. She has a history of asthma is been using albuterol inhalers.       Past Medical History:  Diagnosis Date  . Arthritis    hands, feet, knees  . Asthma   . Migraine   . Mood change (HCC)    due to menopause/ hot flashes  . PONV (postoperative nausea and vomiting)    Past Surgical History:  Procedure Laterality Date  . ABDOMINAL HYSTERECTOMY    . APPENDECTOMY    . CESAREAN SECTION     x 1  . COLONOSCOPY  2012   due in 2017 for suspicious lesion  . COLONOSCOPY WITH PROPOFOL N/A 08/20/2016   Procedure: COLONOSCOPY WITH PROPOFOL;  Surgeon: Midge Miniumarren Wohl, MD;  [facility];  Service: Endoscopy;  Laterality: N/A;  . PARTIAL HYSTERECTOMY     one ovary left  . REPAIR ANKLE LIGAMENT     from fall  . ROTATOR CUFF REPAIR Left    shoulder  . TONSILLECTOMY     Family History  Problem Relation Age of Onset  . Diabetes Paternal Uncle   . Breast cancer Paternal Grandmother [id number]  . CAD Father    Social History  Substance Use Topics  . Smoking status: Never Smoker  . Smokeless tobacco: Never Used  . Alcohol use 0.0 oz/week     Comment: 2 drinks - 2x/mo   OB History    No data available     Review of Systems  Constitutional: Positive for activity change, chills and fatigue. Negative for appetite change and fever.  HENT: Positive for congestion,  postnasal drip, rhinorrhea, sinus pressure and sore throat.   Respiratory: Positive for wheezing. Negative for cough, chest tightness, shortness of breath and stridor.   All other systems reviewed and are negative.   Allergies  Tape  Home Medications   Prior to Admission medications   Medication Sig Start Date End Date Taking? Authorizing Provider  acetaminophen (TYLENOL) 650 MG CR tablet Take 650 mg by mouth 2 (two) times daily.    Historical Provider, MD  albuterol (PROVENTIL HFA;VENTOLIN HFA) 108 (90 BASE) MCG/ACT inhaler Inhale 2 puffs into the lungs every 6 (six) hours as needed for wheezing or shortness of breath. 09/29/15   Schuyler AmorWilliam Plonk, MD  azithromycin (ZITHROMAX) 250 MG tablet Take 1 tablet (250 mg total) by mouth daily. Take first 2 tablets together, then 1 every day until finished. 09/13/16   Lutricia FeilWilliam P Trinia Georgi, PA-C  beclomethasone (QVAR) 40 MCG/ACT inhaler Inhale 1 puff into the lungs 2 (two) times daily.    Historical Provider, MD  citalopram (CELEXA) 20 MG tablet TAKE ONE TABLET BY MOUTH ONCE DAILY 06/17/16   Reubin MilanLaura H Berglund, MD  loratadine (CLARITIN) 10 MG tablet Take 10 mg by mouth daily.    Historical Provider, MD  montelukast (SINGULAIR) 10 MG tablet Take 1 tablet (10 mg total) by mouth at  bedtime. Patient not taking: Reported on 08/20/2016 08/18/16   Reubin Milan, MD  Multiple Vitamins-Minerals (MULTIVITAMIN ADULTS 50+ PO) Take 1 capsule by mouth daily.    Historical Provider, MD  rizatriptan (MAXALT-MLT) 10 MG disintegrating tablet Take 1 tablet by mouth at headache onset may repeat once in 2 hours if needed.  No more than 2 tabs in 24 hours 02/19/16   Historical Provider, MD  topiramate (TOPAMAX) 50 MG tablet Take by mouth. 02/19/16   Historical Provider, MD  Vitamin D, Cholecalciferol, 1000 UNITS CAPS Take 2 capsules by mouth daily.    Historical Provider, MD   Meds Ordered and Administered this Visit  Medications - No data to display  BP 136/79 (BP Location: Left  Arm)   Pulse 79   Temp 98.4 F (36.9 C) (Tympanic)   Resp 16   Ht 5\' 4"  (1.626 m)   Wt 180 lb (81.6 kg)   SpO2 99%   BMI 30.90 kg/m  No data found.   Physical Exam  Constitutional: She is oriented to person, place, and time. She appears well-developed and well-nourished. No distress.  HENT:  Head: Normocephalic and atraumatic.  Right Ear: External ear normal.  Left Ear: External ear normal.  Nose: Nose normal.  Mouth/Throat: Oropharynx is clear and moist.  Eyes: EOM are normal. Pupils are equal, round, and reactive to light. Right eye exhibits no discharge. Left eye exhibits no discharge.  Neck: Normal range of motion. Neck supple.  Pulmonary/Chest: Effort normal and breath sounds normal. No respiratory distress. She has no wheezes. She has no rales.  Musculoskeletal: Normal range of motion.  Lymphadenopathy:    She has no cervical adenopathy.  Neurological: She is alert and oriented to person, place, and time.  Skin: Skin is warm and dry. She is not diaphoretic.  Psychiatric: She has a normal mood and affect. Her behavior is normal. Judgment and thought content normal.  Nursing note and vitals reviewed.   Urgent Care Course   Clinical Course    Procedures (including critical care time)  Labs Review Labs Reviewed - No data to display  Imaging Review No results found.   Visual Acuity Review  Right Eye Distance:   Left Eye Distance:   Bilateral Distance:    Right Eye Near:   Left Eye Near:    Bilateral Near:         MDM   1. Upper respiratory tract infection, unspecified type    Discharge Medication List as of 09/13/2016 12:11 PM    START taking these medications   Details  azithromycin (ZITHROMAX) 250 MG tablet Take 1 tablet (250 mg total) by mouth daily. Take first 2 tablets together, then 1 every day until finished., Starting Mon 09/13/2016, Normal      Plan: 1. Test/x-ray results and diagnosis reviewed with patient 2. rx as per orders;  risks, benefits, potential side effects reviewed with patient 3. Recommend supportive treatment with Use of for nasal drainage. She's had the problem for week and has a history of recurrent sinusitis I will place her on Z-Pak along with a cough syrup. She has a trip planned to [place] next week and hopefully she'll be feeling better and can make the flight at that time. 4. F/u prn if symptoms worsen or don't improve     Lutricia Feil, PA-C 09/13/16 1316

## 2016-09-13 NOTE — ED Triage Notes (Signed)
Patient c/o cough, chest congestion, and runny nose for the past 6-7 days.

## 2017-01-14 ENCOUNTER — Ambulatory Visit: Payer: 59 | Admitting: Internal Medicine

## 2017-01-17 ENCOUNTER — Encounter: Payer: Self-pay | Admitting: Internal Medicine

## 2017-01-17 ENCOUNTER — Ambulatory Visit (INDEPENDENT_AMBULATORY_CARE_PROVIDER_SITE_OTHER): Payer: Self-pay | Admitting: Internal Medicine

## 2017-01-17 ENCOUNTER — Telehealth: Payer: Self-pay

## 2017-01-17 VITALS — BP 128/70 | HR 80 | Ht 64.0 in | Wt 185.0 lb

## 2017-01-17 DIAGNOSIS — M159 Polyosteoarthritis, unspecified: Secondary | ICD-10-CM

## 2017-01-17 DIAGNOSIS — G44229 Chronic tension-type headache, not intractable: Secondary | ICD-10-CM | POA: Insufficient documentation

## 2017-01-17 DIAGNOSIS — R7303 Prediabetes: Secondary | ICD-10-CM

## 2017-01-17 DIAGNOSIS — J4520 Mild intermittent asthma, uncomplicated: Secondary | ICD-10-CM

## 2017-01-17 DIAGNOSIS — K635 Polyp of colon: Secondary | ICD-10-CM

## 2017-01-17 DIAGNOSIS — M150 Primary generalized (osteo)arthritis: Secondary | ICD-10-CM

## 2017-01-17 DIAGNOSIS — E559 Vitamin D deficiency, unspecified: Secondary | ICD-10-CM

## 2017-01-17 DIAGNOSIS — G43909 Migraine, unspecified, not intractable, without status migrainosus: Secondary | ICD-10-CM

## 2017-01-17 DIAGNOSIS — F39 Unspecified mood [affective] disorder: Secondary | ICD-10-CM

## 2017-01-17 DIAGNOSIS — IMO0001 Reserved for inherently not codable concepts without codable children: Secondary | ICD-10-CM

## 2017-01-17 MED ORDER — MONTELUKAST SODIUM 10 MG PO TABS: 10.0000 mg | ORAL_TABLET | Freq: Every day | ORAL | 3 refills | Status: DC

## 2017-01-17 MED ORDER — CITALOPRAM HYDROBROMIDE 20 MG PO TABS: 20.0000 mg | ORAL_TABLET | Freq: Every day | ORAL | 3 refills | Status: DC

## 2017-01-17 NOTE — Telephone Encounter (Signed)
Error

## 2017-01-17 NOTE — Progress Notes (Signed)
Date:  01/17/2017   Name:  [name]   DOB:  [birth year]   MRN:  [id number]   Chief Complaint: Diabetes Diabetes  She presents for her follow-up diabetic visit. Diabetes type: pre-diabetes. Her disease course has been stable. Hypoglycemia symptoms include headaches (two in the last 6 months). Pertinent negatives for hypoglycemia include no dizziness. Pertinent negatives for diabetes include no chest pain. Current diabetic treatment includes diet. She is compliant with treatment most of the time.  Migraine   This is a recurrent problem. The current episode started more than 1 year ago. The problem occurs seasonly. Progression since onset: doing great on topamax prevention. Pertinent negatives include no abdominal pain, coughing, dizziness, fever or numbness.  Asthma  She complains of wheezing. There is no cough or shortness of breath. This is a recurrent problem. The current episode started more than 1 year ago. The problem occurs rarely (has some increased wheezing while working in the cold [place].). The problem has been rapidly improving. Associated symptoms include headaches (two in the last 6 months) and myalgias. Pertinent negatives include no chest pain or fever. Her symptoms are alleviated by beta-agonist and steroid inhaler. Her past medical history is significant for asthma.  Mood Disorder - good sx control on Celexa.  Having some stress with a new job but coping well with it.  May be going back to her old job in April. Arthalgias - has stiffness and nodules in fingers of both hands.  Taking tylenol with minimal benefit.  Concerned about joint deformity developing and would like to consult Rheumatology. She says that she has been tested in the past for RA. Vitamin D def - now on daily supplement.  Lab Results  Component Value Date   HGBA1C 5.8 (H) 07/13/2016     Review of Systems  Constitutional: Negative for chills, diaphoresis, fever and unexpected weight change.  Eyes:  Negative for visual disturbance.  Respiratory: Positive for chest tightness and wheezing. Negative for cough and shortness of breath.   Cardiovascular: Negative for chest pain, palpitations and leg swelling.  Gastrointestinal: Negative for abdominal pain and blood in stool.  Musculoskeletal: Positive for arthralgias and myalgias.  Skin: Negative for rash.  Neurological: Positive for headaches (two in the last 6 months). Negative for dizziness, syncope and numbness.  Hematological: Negative for adenopathy.  Psychiatric/Behavioral: Negative for dysphoric mood and sleep disturbance.    Patient Active Problem List   Diagnosis Date Noted  . Personal history of colonic polyps   . Mood disorder (HCC) 07/13/2016  . Foot pain, left 07/13/2016  . Prediabetes 11/07/2015  . Hyperlipidemia 11/07/2015  . Low serum thyroid stimulating hormone (TSH) 09/30/2015  . Osteoarthritis 09/29/2015  . Menopausal syndrome (hot flashes) 09/29/2015  . Obesity, Class I, BMI 30-34.9 09/26/2015  . Vitamin D deficiency 09/26/2015  . Migraine without status migrainosus, not intractable 09/26/2015  . Moderate intermittent asthma without complication 09/26/2015  . Colon polyp 09/26/2015  . Synovial cyst of left knee 09/26/2015  . Allergic rhinitis 09/26/2015    Prior to Admission medications   Medication Sig Start Date End Date Taking? Authorizing Provider  acetaminophen (TYLENOL) 650 MG CR tablet Take 650 mg by mouth 2 (two) times daily.   Yes Historical Provider, MD  albuterol (PROVENTIL HFA;VENTOLIN HFA) 108 (90 BASE) MCG/ACT inhaler Inhale 2 puffs into the lungs every 6 (six) hours as needed for wheezing or shortness of breath. 09/29/15  Yes Schuyler Amor, MD  beclomethasone (QVAR) 40 MCG/ACT inhaler Inhale 1  puff into the lungs 2 (two) times daily.   Yes Historical Provider, MD  citalopram (CELEXA) 20 MG tablet TAKE ONE TABLET BY MOUTH ONCE DAILY 06/17/16  Yes Reubin MilanLaura H Annely Sliva, MD  Melatonin 10 MG CAPS Take 10  mg by mouth every other day.   Yes Historical Provider, MD  montelukast (SINGULAIR) 10 MG tablet Take 1 tablet (10 mg total) by mouth at bedtime. 08/18/16  Yes Reubin MilanLaura H Mackenzie Lia, MD  Multiple Vitamins-Minerals (MULTIVITAMIN ADULTS 50+ PO) Take 1 capsule by mouth daily.   Yes Historical Provider, MD  rizatriptan (MAXALT-MLT) 10 MG disintegrating tablet Take 1 tablet by mouth at headache onset may repeat once in 2 hours if needed.  No more than 2 tabs in 24 hours 02/19/16  Yes Historical Provider, MD  topiramate (TOPAMAX) 50 MG tablet Take by mouth. 02/19/16  Yes Historical Provider, MD  Vitamin D, Cholecalciferol, 1000 UNITS CAPS Take 2 capsules by mouth daily.   Yes Historical Provider, MD    Allergies  Allergen Reactions  . Tape Rash    Past Surgical History:  Procedure Laterality Date  . ABDOMINAL HYSTERECTOMY    . APPENDECTOMY    . CESAREAN SECTION     x 1  . COLONOSCOPY  2012   due in 2017 for suspicious lesion  . COLONOSCOPY WITH PROPOFOL N/A 08/20/2016   Procedure: COLONOSCOPY WITH PROPOFOL;  Surgeon: Midge Miniumarren Wohl, MD;  [facility];  Service: Endoscopy;  Laterality: N/A;  . PARTIAL HYSTERECTOMY     one ovary left  . REPAIR ANKLE LIGAMENT     from fall  . ROTATOR CUFF REPAIR Left    shoulder  . TONSILLECTOMY      Social History  Substance Use Topics  . Smoking status: Never Smoker  . Smokeless tobacco: Never Used  . Alcohol use 0.0 oz/week     Comment: 2 drinks - 2x/mo     Medication list has been reviewed and updated.   Physical Exam  Constitutional: She is oriented to person, place, and time. She appears well-developed. No distress.  HENT:  Head: Normocephalic and atraumatic.  Neck: Carotid bruit is not present.  Cardiovascular: Normal rate, regular rhythm, S1 normal and normal heart sounds.   No murmur heard. Pulmonary/Chest: Effort normal. No respiratory distress. She has no decreased breath sounds. She has no wheezes.  Musculoskeletal: Normal  range of motion.  OA changes of both hands, nodules noted, no joint swelling or redness  Neurological: She is alert and oriented to person, place, and time.  Skin: Skin is warm and dry. No rash noted.  Psychiatric: She has a normal mood and affect. Her speech is normal and behavior is normal. Thought content normal.  Nursing note and vitals reviewed.   BP 128/70   Pulse 80   Ht 5\' 4"  (1.626 m)   Wt 185 lb (83.9 kg)   SpO2 98%   BMI 31.76 kg/m   Assessment and Plan: 1. Prediabetes Continue diet and weight control - Comprehensive metabolic panel - Hemoglobin A1c  2. Vitamin D deficiency Continue daily supplement - VITAMIN D 25 Hydroxy (Vit-D Deficiency, Fractures)  3. Migraine without status migrainosus, not intractable, unspecified migraine type Controlled - followed by Neurology  4. Moderate intermittent asthma without complication Stable; continue Qvar and albuterol PRN - montelukast (SINGULAIR) 10 MG tablet; Take 1 tablet (10 mg total) by mouth at bedtime.  Dispense: 90 tablet; Refill: 3  5. Mood disorder (HCC) Controlled on medication - citalopram (CELEXA) 20 MG  tablet; Take 1 tablet (20 mg total) by mouth daily.  Dispense: 90 tablet; Refill: 3 - TSH  6. Primary osteoarthritis involving multiple joints Continue tylenol as needed - CBC with Differential/Platelet - Ambulatory referral to Rheumatology   Bari Edward, MD [facility]  01/17/2017

## 2017-01-18 LAB — COMPREHENSIVE METABOLIC PANEL
A/G RATIO: 1.8 (ref 1.2–2.2)
ALBUMIN: 4.4 g/dL (ref 3.5–5.5)
ALT: 14 IU/L (ref 0–32)
AST: 17 IU/L (ref 0–40)
Alkaline Phosphatase: 76 IU/L (ref 39–117)
BUN/Creatinine Ratio: 16 (ref 9–23)
BUN: 15 mg/dL (ref 6–24)
Bilirubin Total: 0.3 mg/dL (ref 0.0–1.2)
CALCIUM: 9.3 mg/dL (ref 8.7–10.2)
CO2: 25 mmol/L (ref 18–29)
Chloride: 103 mmol/L (ref 96–106)
Creatinine, Ser: 0.96 mg/dL (ref 0.57–1.00)
GFR, EST AFRICAN AMERICAN: 76 mL/min/{1.73_m2} (ref >59)
GFR, EST NON AFRICAN AMERICAN: 66 mL/min/{1.73_m2} (ref >59)
GLUCOSE: 99 mg/dL (ref 65–99)
Globulin, Total: 2.4 g/dL (ref 1.5–4.5)
Potassium: 3.6 mmol/L (ref 3.5–5.2)
Sodium: 144 mmol/L (ref 134–144)
TOTAL PROTEIN: 6.8 g/dL (ref 6.0–8.5)

## 2017-01-18 LAB — CBC WITH DIFFERENTIAL/PLATELET
BASOS ABS: 0 10*3/uL (ref 0.0–0.2)
Basos: 0 %
EOS (ABSOLUTE): 0.2 10*3/uL (ref 0.0–0.4)
Eos: 3 %
HEMOGLOBIN: 13.6 g/dL (ref 11.1–15.9)
Hematocrit: 41.3 % (ref 34.0–46.6)
IMMATURE GRANS (ABS): 0 10*3/uL (ref 0.0–0.1)
IMMATURE GRANULOCYTES: 0 %
LYMPHS: 36 %
Lymphocytes Absolute: 2.5 10*3/uL (ref 0.7–3.1)
MCH: 30.4 pg (ref 26.6–33.0)
MCHC: 32.9 g/dL (ref 31.5–35.7)
MCV: 92 fL (ref 79–97)
MONOCYTES: 4 %
Monocytes Absolute: 0.3 10*3/uL (ref 0.1–0.9)
Neutrophils Absolute: 3.9 10*3/uL (ref 1.4–7.0)
Neutrophils: 57 %
Platelets: 308 10*3/uL (ref 150–379)
RBC: 4.47 x10E6/uL (ref 3.77–5.28)
RDW: 14 % (ref 12.3–15.4)
WBC: 6.9 10*3/uL (ref 3.4–10.8)

## 2017-01-18 LAB — HEMOGLOBIN A1C
Est. average glucose Bld gHb Est-mCnc: 120 mg/dL
Hgb A1c MFr Bld: 5.8 % — ABNORMAL HIGH (ref 4.8–5.6)

## 2017-01-18 LAB — VITAMIN D 25 HYDROXY (VIT D DEFICIENCY, FRACTURES): VIT D 25 HYDROXY: 31.3 ng/mL (ref 30.0–100.0)

## 2017-01-18 LAB — TSH: TSH: 1.05 u[IU]/mL (ref 0.450–4.500)

## 2017-01-27 DIAGNOSIS — M159 Polyosteoarthritis, unspecified: Secondary | ICD-10-CM | POA: Insufficient documentation

## 2017-03-30 ENCOUNTER — Institutional Professional Consult (permissible substitution): Payer: 59 | Admitting: Neurology

## 2017-05-13 ENCOUNTER — Ambulatory Visit (INDEPENDENT_AMBULATORY_CARE_PROVIDER_SITE_OTHER): Payer: 59 | Admitting: Internal Medicine

## 2017-05-13 ENCOUNTER — Encounter: Payer: Self-pay | Admitting: Internal Medicine

## 2017-05-13 ENCOUNTER — Other Ambulatory Visit: Payer: Self-pay | Admitting: Internal Medicine

## 2017-05-13 DIAGNOSIS — F39 Unspecified mood [affective] disorder: Secondary | ICD-10-CM

## 2017-05-13 MED ORDER — CITALOPRAM HYDROBROMIDE 20 MG PO TABS: 30.0000 mg | ORAL_TABLET | Freq: Every day | ORAL | 3 refills | Status: DC

## 2017-05-13 NOTE — Progress Notes (Signed)
Date:  05/13/2017   Name:  [name]   DOB:  08-23-1961   MRN:  [id number]   Chief Complaint: Depression (Having lots of stress. A lot going on at home. Not going to be able to maintain at work if this continues. "I want to run away very badly. Scored 14 on PHQ9. Feels like needs a little more help.  ) Depression         This is a chronic problem.  The problem occurs constantly.  The problem has been gradually worsening since onset.  Associated symptoms include fatigue and headaches.  Associated symptoms include no suicidal ideas.  Past treatments include SSRIs - Selective serotonin reuptake inhibitors. Under stress at work and at home.  Husband is having depression and anxiety issues that are affecting her.  She is on Celexa and has done well for a while.  She feels that she needs something more to help her through this rough patch. She did not do well in the past on Wellbutrin, Effexor.    Review of Systems  Constitutional: Positive for fatigue. Negative for chills and fever.  Respiratory: Positive for chest tightness. Negative for cough, shortness of breath and wheezing.   Cardiovascular: Positive for palpitations. Negative for chest pain and leg swelling.  Neurological: Positive for headaches.  Psychiatric/Behavioral: Positive for agitation, depression, dysphoric mood and sleep disturbance. Negative for suicidal ideas. The patient is nervous/anxious.     Patient Active Problem List   Diagnosis Date Noted  . Chronic tension-type headache, not intractable 01/17/2017  . Mood disorder (HCC) 07/13/2016  . Foot pain, left 07/13/2016  . Prediabetes 11/07/2015  . Hyperlipidemia 11/07/2015  . Low serum thyroid stimulating hormone (TSH) 09/30/2015  . Osteoarthritis 09/29/2015  . Menopausal syndrome (hot flashes) 09/29/2015  . Obesity, Class I, BMI 30-34.9 09/26/2015  . Vitamin D deficiency 09/26/2015  . Migraine without status migrainosus, not intractable 09/26/2015  . Moderate  intermittent asthma without complication 09/26/2015  . Colon polyp 09/26/2015  . Synovial cyst of left knee 09/26/2015  . Allergic rhinitis 09/26/2015    Prior to Admission medications   Medication Sig Start Date End Date Taking? Authorizing Provider  acetaminophen (TYLENOL) 650 MG CR tablet Take 650 mg by mouth 2 (two) times daily.   Yes [provider]  albuterol (PROVENTIL HFA;VENTOLIN HFA) 108 (90 BASE) MCG/ACT inhaler Inhale 2 puffs into the lungs every 6 (six) hours as needed for wheezing or shortness of breath. 09/29/15  Yes Plonk, Chrissie Noa, MD  beclomethasone (QVAR) 40 MCG/ACT inhaler Inhale 1 puff into the lungs 2 (two) times daily.   Yes [provider]  cetirizine (ZYRTEC) 10 MG tablet Take 10 mg by mouth daily.   Yes [provider]  citalopram (CELEXA) 20 MG tablet Take 1 tablet (20 mg total) by mouth daily. 01/17/17  Yes Reubin Milan, MD  Melatonin 10 MG CAPS Take 10 mg by mouth every other day.   Yes [provider]  montelukast (SINGULAIR) 10 MG tablet Take 1 tablet (10 mg total) by mouth at bedtime. 01/17/17  Yes Reubin Milan, MD  Multiple Vitamins-Minerals (MULTIVITAMIN ADULTS 50+ PO) Take 1 capsule by mouth daily.   Yes [provider]  rizatriptan (MAXALT-MLT) 10 MG disintegrating tablet Take 1 tablet by mouth at headache onset may repeat once in 2 hours if needed.  No more than 2 tabs in 24 hours 02/19/16  Yes [provider]  topiramate (TOPAMAX) 50 MG tablet Take by mouth. 02/19/16  Yes [provider]  Vitamin D, Cholecalciferol, 1000 UNITS CAPS Take 2 capsules by mouth daily.   Yes [provider]    Allergies  Allergen Reactions  . Tape Rash    Past Surgical History:  Procedure Laterality Date  . ABDOMINAL HYSTERECTOMY    . APPENDECTOMY    . CESAREAN SECTION     x 1  . COLONOSCOPY  2012   due in 2017 for suspicious lesion  . COLONOSCOPY WITH PROPOFOL N/A 08/20/2016   Procedure:  COLONOSCOPY WITH PROPOFOL;  Surgeon: Midge Miniumarren Wohl, MD;  [facility];  Service: Endoscopy;  Laterality: N/A;  . PARTIAL HYSTERECTOMY     one ovary left  . REPAIR ANKLE LIGAMENT     from fall  . ROTATOR CUFF REPAIR Left    shoulder  . TONSILLECTOMY      Social History  Substance Use Topics  . Smoking status: Never Smoker  . Smokeless tobacco: Never Used  . Alcohol use 0.0 oz/week     Comment: 2 drinks - 2x/mo     Medication list has been reviewed and updated.   Physical Exam  Constitutional: She is oriented to person, place, and time. She appears well-developed. No distress.  HENT:  Head: Normocephalic and atraumatic.  Cardiovascular: Normal rate, regular rhythm and normal heart sounds.   Pulmonary/Chest: Effort normal and breath sounds normal. No respiratory distress.  Musculoskeletal: Normal range of motion.  Neurological: She is alert and oriented to person, place, and time.  Skin: Skin is warm and dry. No rash noted.  Psychiatric: Her speech is normal and behavior is normal. Judgment and thought content normal. Her mood appears anxious. Her affect is not inappropriate. She does not exhibit a depressed mood.  Nursing note and vitals reviewed.   BP 124/74   Ht 5\' 4"  (1.626 m)   Wt 188 lb (85.3 kg)   BMI 32.27 kg/m   Assessment and Plan: 1. Mood disorder (HCC) Increase celexa to 30 mg daily - taper down after current issues improve - citalopram (CELEXA) 20 MG tablet; Take 1.5 tablets (30 mg total) by mouth daily.  Dispense: 135 tablet; Refill: 3   Meds ordered this encounter  Medications  . citalopram (CELEXA) 20 MG tablet    Sig: Take 1.5 tablets (30 mg total) by mouth daily.    Dispense:  135 tablet    Refill:  3    Bari EdwardLaura Miran Kautzman, MD [facility]  05/13/2017

## 2017-05-21 ENCOUNTER — Encounter: Payer: Self-pay | Admitting: Internal Medicine

## 2017-05-23 ENCOUNTER — Telehealth: Payer: Self-pay | Admitting: Internal Medicine

## 2017-05-23 NOTE — Telephone Encounter (Signed)
Patient card is showing dental card not health insurance.

## 2017-05-23 NOTE — Telephone Encounter (Signed)
Pt sent insurance card to show proof of insurance.

## 2017-05-28 ENCOUNTER — Ambulatory Visit
Admission: EM | Admit: 2017-05-28 | Discharge: 2017-05-28 | Disposition: A | Discharge status: Home / Self Care | Payer: 59 | Attending: Family Medicine | Admitting: Family Medicine

## 2017-05-28 ENCOUNTER — Encounter: Payer: Self-pay | Admitting: Emergency Medicine

## 2017-05-28 DIAGNOSIS — R05 Cough: Secondary | ICD-10-CM | POA: Diagnosis not present

## 2017-05-28 DIAGNOSIS — J209 Acute bronchitis, unspecified: Secondary | ICD-10-CM

## 2017-05-28 DIAGNOSIS — R062 Wheezing: Secondary | ICD-10-CM

## 2017-05-28 DIAGNOSIS — J4521 Mild intermittent asthma with (acute) exacerbation: Secondary | ICD-10-CM

## 2017-05-28 LAB — RAPID STREP SCREEN (MED CTR MEBANE ONLY): STREPTOCOCCUS, GROUP A SCREEN (DIRECT): NEGATIVE

## 2017-05-28 MED ORDER — IPRATROPIUM-ALBUTEROL 0.5-2.5 (3) MG/3ML IN SOLN
3.0000 mL | Freq: Once | RESPIRATORY_TRACT | Status: AC
  Administered 2017-05-28: 3 mL via RESPIRATORY_TRACT

## 2017-05-28 MED ORDER — GUAIFENESIN-CODEINE 100-10 MG/5ML PO SYRP: 5.0000 mL | ORAL_SOLUTION | Freq: Three times a day (TID) | ORAL | 0 refills | Status: DC | PRN

## 2017-05-28 MED ORDER — AZITHROMYCIN 250 MG PO TABS: 250.0000 mg | ORAL_TABLET | Freq: Every day | ORAL | 0 refills | Status: DC

## 2017-05-28 NOTE — ED Provider Notes (Signed)
[facility]    CSN: [id number] Arrival date & time: 05/28/17  1407     History   Chief Complaint Chief Complaint  Patient presents with  . Cough    HPI [name] is a 56 y.o. female.   The history is provided by the patient.  Cough  Cough characteristics:  Non-productive Severity:  Mild Onset quality:  Gradual Duration:  3 days Timing:  Intermittent Progression:  Worsening Chronicity:  New Smoker: no   Context: exposure to allergens   Relieved by:  Steroid inhaler (pt not on B agonist) Worsened by:  Nothing Associated symptoms: wheezing   Associated symptoms: no chest pain, no chills, no diaphoresis, no ear fullness, no ear pain, no eye discharge, no fever, no headaches, no myalgias, no rash, no rhinorrhea, no shortness of breath and no sore throat     Past Medical History:  Diagnosis Date  . Arthritis    hands, feet, knees  . Asthma   . Migraine   . Mood change (HCC)    due to menopause/ hot flashes  . PONV (postoperative nausea and vomiting)     Patient Active Problem List   Diagnosis Date Noted  . Chronic tension-type headache, not intractable 01/17/2017  . Mood disorder (HCC) 07/13/2016  . Foot pain, left 07/13/2016  . Prediabetes 11/07/2015  . Hyperlipidemia 11/07/2015  . Low serum thyroid stimulating hormone (TSH) 09/30/2015  . Osteoarthritis 09/29/2015  . Menopausal syndrome (hot flashes) 09/29/2015  . Obesity, Class I, BMI 30-34.9 09/26/2015  . Vitamin D deficiency 09/26/2015  . Migraine without status migrainosus, not intractable 09/26/2015  . Moderate intermittent asthma without complication 09/26/2015  . Colon polyp 09/26/2015  . Synovial cyst of left knee 09/26/2015  . Allergic rhinitis 09/26/2015    Past Surgical History:  Procedure Laterality Date  . ABDOMINAL HYSTERECTOMY    . APPENDECTOMY    . CESAREAN SECTION     x 1  . COLONOSCOPY  2012   due in 2017 for suspicious lesion  . COLONOSCOPY WITH PROPOFOL N/A  08/20/2016   Procedure: COLONOSCOPY WITH PROPOFOL;  Surgeon: Midge Miniumarren Wohl, MD;  [facility];  Service: Endoscopy;  Laterality: N/A;  . PARTIAL HYSTERECTOMY     one ovary left  . REPAIR ANKLE LIGAMENT     from fall  . ROTATOR CUFF REPAIR Left    shoulder  . TONSILLECTOMY      OB History    No data available       Home Medications    Prior to Admission medications   Medication Sig Start Date End Date Taking? Authorizing Provider  acetaminophen (TYLENOL) 650 MG CR tablet Take 650 mg by mouth 2 (two) times daily.    [provider]  albuterol (PROVENTIL HFA;VENTOLIN HFA) 108 (90 BASE) MCG/ACT inhaler Inhale 2 puffs into the lungs every 6 (six) hours as needed for wheezing or shortness of breath. 09/29/15   Plonk, Chrissie NoaWilliam, MD  azithromycin (ZITHROMAX) 250 MG tablet Take 1 tablet (250 mg total) by mouth daily. Take first 2 tablets together, then 1 every day until finished. 05/28/17   Duanne LimerickJones, Porshe Fleagle C, MD  beclomethasone (QVAR) 40 MCG/ACT inhaler Inhale 1 puff into the lungs 2 (two) times daily.    [provider]  cetirizine (ZYRTEC) 10 MG tablet Take 10 mg by mouth daily.    [provider]  citalopram (CELEXA) 20 MG tablet Take 1.5 tablets (30 mg total) by mouth daily. 05/13/17   Reubin MilanBerglund, Laura H,  MD  guaiFENesin-codeine (ROBITUSSIN AC) 100-10 MG/5ML syrup Take 5 mLs by mouth 3 (three) times daily as needed for cough. 05/28/17   Duanne Limerick, MD  Melatonin 10 MG CAPS Take 10 mg by mouth every other day.    [provider]  montelukast (SINGULAIR) 10 MG tablet Take 1 tablet (10 mg total) by mouth at bedtime. 01/17/17   Reubin Milan, MD  Multiple Vitamins-Minerals (MULTIVITAMIN ADULTS 50+ PO) Take 1 capsule by mouth daily.    [provider]  rizatriptan (MAXALT-MLT) 10 MG disintegrating tablet Take 1 tablet by mouth at headache onset may repeat once in 2 hours if needed.  No more than 2 tabs in 24 hours 02/19/16   [provider]  topiramate (TOPAMAX) 50 MG tablet Take by mouth. 02/19/16   [provider]  Vitamin D, Cholecalciferol, 1000 UNITS CAPS Take 2 capsules by mouth daily.    [provider]    Family History Family History  Problem Relation Age of Onset  . Diabetes Paternal Uncle   . Breast cancer Paternal Grandmother 13  . CAD Father     Social History Social History  Substance Use Topics  . Smoking status: Never Smoker  . Smokeless tobacco: Never Used  . Alcohol use 0.0 oz/week     Comment: 2 drinks - 2x/mo     Allergies   Tape   Review of Systems Review of Systems  Constitutional: Negative.  Negative for chills, diaphoresis, fatigue, fever and unexpected weight change.  HENT: Negative for congestion, ear discharge, ear pain, rhinorrhea, sinus pressure, sneezing and sore throat.   Eyes: Negative for photophobia, pain, discharge, redness and itching.  Respiratory: Positive for cough and wheezing. Negative for shortness of breath and stridor.   Cardiovascular: Negative for chest pain.  Gastrointestinal: Negative for abdominal pain, blood in stool, constipation, diarrhea, nausea and vomiting.  Endocrine: Negative for cold intolerance, heat intolerance, polydipsia, polyphagia and polyuria.  Genitourinary: Negative for dysuria, flank pain, frequency, hematuria, menstrual problem, pelvic pain, urgency, vaginal bleeding and vaginal discharge.  Musculoskeletal: Negative for arthralgias, back pain and myalgias.  Skin: Negative for rash.  Allergic/Immunologic: Negative for environmental allergies and food allergies.  Neurological: Negative for dizziness, weakness, light-headedness, numbness and headaches.  Hematological: Negative for adenopathy. Does not bruise/bleed easily.  Psychiatric/Behavioral: Negative for dysphoric mood. The patient is not nervous/anxious.      Physical Exam Triage Vital Signs ED Triage Vitals  Enc Vitals Group     BP 05/28/17 1438  130/68     Pulse Rate 05/28/17 1438 96     Resp 05/28/17 1438 14     Temp 05/28/17 1438 100 F (37.8 C)     Temp Source 05/28/17 1438 Oral     SpO2 05/28/17 1438 98 %     Weight 05/28/17 1436 189 lb (85.7 kg)     Height 05/28/17 1436 5\' 4"  (1.626 m)     Head Circumference --      Peak Flow --      Pain Score 05/28/17 1436 6     Pain Loc --      Pain Edu? --      Excl. in GC? --    No data found.   Updated Vital Signs BP 130/68 (BP Location: Left Arm)   Pulse 96   Temp 100 F (37.8 C) (Oral)   Resp 14   Ht 5\' 4"  (1.626 m)   Wt 189 lb (85.7 kg)   SpO2  98%   BMI 32.44 kg/m   Visual Acuity Right Eye Distance:   Left Eye Distance:   Bilateral Distance:    Right Eye Near:   Left Eye Near:    Bilateral Near:     Physical Exam  Constitutional: No distress.  HENT:  Head: Normocephalic and atraumatic.  Right Ear: External ear normal.  Left Ear: External ear normal.  Nose: Nose normal.  Mouth/Throat: Oropharynx is clear and moist.  Eyes: Conjunctivae and EOM are normal. Pupils are equal, round, and reactive to light. Right eye exhibits no discharge. Left eye exhibits no discharge.  Neck: Normal range of motion. Neck supple. No JVD present. No thyromegaly present.  Cardiovascular: Normal rate, regular rhythm, normal heart sounds and intact distal pulses.  Exam reveals no gallop and no friction rub.   No murmur heard. Pulmonary/Chest: Effort normal. She has wheezes. She has no rales.  Increased E/I/improved s/p nebulization  Abdominal: Soft. Bowel sounds are normal. She exhibits no mass. There is no tenderness. There is no guarding.  Musculoskeletal: Normal range of motion. She exhibits no edema.  Lymphadenopathy:    She has no cervical adenopathy.  Neurological: She is alert. She has normal reflexes.  Skin: Skin is warm and dry. She is not diaphoretic.  Nursing note and vitals reviewed.    UC Treatments / Results  Labs (all labs ordered are listed, but only  abnormal results are displayed) Labs Reviewed  RAPID STREP SCREEN (NOT AT [facility])  CULTURE, GROUP A STREP [facility])    EKG  EKG Interpretation None       Radiology No results found.  Procedures Procedures (including critical care time)  Medications Ordered in UC Medications  ipratropium-albuterol (DUONEB) 0.5-2.5 (3) MG/3ML nebulizer solution 3 mL (3 mLs Nebulization Given 05/28/17 1452)     Initial Impression / Assessment and Plan / UC Course  I have reviewed the triage vital signs and the nursing notes.  Pertinent labs & imaging results that were available during my care of the patient were reviewed by me and considered in my medical decision making (see chart for details).       Final Clinical Impressions(s) / UC Diagnoses   Final diagnoses:  Acute bronchitis, unspecified organism  Mild intermittent asthma with exacerbation    New Prescriptions New Prescriptions   AZITHROMYCIN (ZITHROMAX) 250 MG TABLET    Take 1 tablet (250 mg total) by mouth daily. Take first 2 tablets together, then 1 every day until finished.   GUAIFENESIN-CODEINE (ROBITUSSIN AC) 100-10 MG/5ML SYRUP    Take 5 mLs by mouth 3 (three) times daily as needed for cough.     Duanne Limerick, MD 05/28/17 [id number]

## 2017-05-28 NOTE — ED Triage Notes (Signed)
Patient c/o cough and chest congestion since Tuesday.   

## 2017-05-31 ENCOUNTER — Ambulatory Visit (INDEPENDENT_AMBULATORY_CARE_PROVIDER_SITE_OTHER): Payer: 59 | Admitting: Internal Medicine

## 2017-05-31 ENCOUNTER — Encounter: Payer: Self-pay | Admitting: Internal Medicine

## 2017-05-31 VITALS — BP 122/70 | HR 107 | Temp 99.0°F | Ht 64.0 in | Wt 187.0 lb

## 2017-05-31 DIAGNOSIS — J40 Bronchitis, not specified as acute or chronic: Secondary | ICD-10-CM | POA: Diagnosis not present

## 2017-05-31 DIAGNOSIS — J209 Acute bronchitis, unspecified: Secondary | ICD-10-CM

## 2017-05-31 LAB — CULTURE, GROUP A STREP (THRC)

## 2017-05-31 MED ORDER — ALBUTEROL SULFATE (2.5 MG/3ML) 0.083% IN NEBU: 2.5000 mg | INHALATION_SOLUTION | Freq: Once | RESPIRATORY_TRACT | Status: DC

## 2017-05-31 MED ORDER — PREDNISONE 10 MG PO TABS: 40.0000 mg | ORAL_TABLET | Freq: Every day | ORAL | 0 refills | Status: AC

## 2017-05-31 NOTE — Progress Notes (Signed)
Date:  05/31/2017   Name:  [name]   DOB:  November 10, 1961   MRN:  [id number]   Chief Complaint: Bronchitis (Not getting any better- was seen in UC by Dr. Yetta Barre and was given [facility] treatment. Helped tremendously- would like another neb treatment today. )  Cough  This is a chronic problem. The current episode started in the past 7 days. The problem has been waxing and waning. The problem occurs hourly. The cough is productive of sputum. Associated symptoms include a fever and wheezing. Pertinent negatives include no chest pain, headaches or heartburn. She has tried a beta-agonist inhaler and steroid inhaler for the symptoms. The treatment provided mild relief.     Review of Systems  Constitutional: Positive for fever.  Respiratory: Positive for cough and wheezing.   Cardiovascular: Negative for chest pain.  Gastrointestinal: Negative for heartburn.  Neurological: Negative for headaches.    Patient Active Problem List   Diagnosis Date Noted  . Chronic tension-type headache, not intractable 01/17/2017  . Mood disorder (HCC) 07/13/2016  . Foot pain, left 07/13/2016  . Prediabetes 11/07/2015  . Hyperlipidemia 11/07/2015  . Low serum thyroid stimulating hormone (TSH) 09/30/2015  . Osteoarthritis 09/29/2015  . Menopausal syndrome (hot flashes) 09/29/2015  . Obesity, Class I, BMI 30-34.9 09/26/2015  . Vitamin D deficiency 09/26/2015  . Migraine without status migrainosus, not intractable 09/26/2015  . Moderate intermittent asthma without complication 09/26/2015  . Colon polyp 09/26/2015  . Synovial cyst of left knee 09/26/2015  . Allergic rhinitis 09/26/2015    Prior to Admission medications   Medication Sig Start Date End Date Taking? Authorizing Provider  acetaminophen (TYLENOL) 650 MG CR tablet Take 650 mg by mouth 2 (two) times daily.   Yes [provider]  albuterol (PROVENTIL HFA;VENTOLIN HFA) 108 (90 BASE) MCG/ACT inhaler Inhale 2 puffs into the lungs every 6 (six)  hours as needed for wheezing or shortness of breath. 09/29/15  Yes Plonk, Chrissie Noa, MD  azithromycin (ZITHROMAX) 250 MG tablet Take 1 tablet (250 mg total) by mouth daily. Take first 2 tablets together, then 1 every day until finished. 05/28/17  Yes Duanne Limerick, MD  beclomethasone (QVAR) 40 MCG/ACT inhaler Inhale 1 puff into the lungs 2 (two) times daily.   Yes [provider]  cetirizine (ZYRTEC) 10 MG tablet Take 10 mg by mouth daily.   Yes [provider]  citalopram (CELEXA) 20 MG tablet Take 1.5 tablets (30 mg total) by mouth daily. 05/13/17  Yes Reubin Milan, MD  guaiFENesin-codeine [facility]) 100-10 MG/5ML syrup Take 5 mLs by mouth 3 (three) times daily as needed for cough. 05/28/17  Yes Duanne Limerick, MD  Melatonin 10 MG CAPS Take 10 mg by mouth every other day.   Yes [provider]  montelukast (SINGULAIR) 10 MG tablet Take 1 tablet (10 mg total) by mouth at bedtime. 01/17/17  Yes Reubin Milan, MD  Multiple Vitamins-Minerals (MULTIVITAMIN ADULTS 50+ PO) Take 1 capsule by mouth daily.   Yes [provider]  rizatriptan (MAXALT-MLT) 10 MG disintegrating tablet Take 1 tablet by mouth at headache onset may repeat once in 2 hours if needed.  No more than 2 tabs in 24 hours 02/19/16  Yes [provider]  topiramate (TOPAMAX) 50 MG tablet Take by mouth. 02/19/16  Yes [provider]  Vitamin D, Cholecalciferol, 1000 UNITS CAPS Take 2 capsules by mouth daily.   Yes [provider]    Allergies  Allergen Reactions  .  Tape Rash    Past Surgical History:  Procedure Laterality Date  . ABDOMINAL HYSTERECTOMY    . APPENDECTOMY    . CESAREAN SECTION     x 1  . COLONOSCOPY  2012   due in 2017 for suspicious lesion  . COLONOSCOPY WITH PROPOFOL N/A 08/20/2016   Procedure: COLONOSCOPY WITH PROPOFOL;  Surgeon: Midge Miniumarren Wohl, MD;  [facility];  Service: Endoscopy;  Laterality: N/A;  . PARTIAL HYSTERECTOMY       one ovary left  . REPAIR ANKLE LIGAMENT     from fall  . ROTATOR CUFF REPAIR Left    shoulder  . TONSILLECTOMY      Social History  Substance Use Topics  . Smoking status: Never Smoker  . Smokeless tobacco: Never Used  . Alcohol use 0.0 oz/week     Comment: 2 drinks - 2x/mo     Medication list has been reviewed and updated.   Physical Exam  Constitutional: She is oriented to person, place, and time. She appears well-developed. No distress.  HENT:  Head: Normocephalic and atraumatic.  Cardiovascular: Normal rate, regular rhythm and normal heart sounds.   Pulmonary/Chest: Effort normal. No accessory muscle usage. No respiratory distress. She has decreased breath sounds. She has no wheezes.  Musculoskeletal: Normal range of motion.  Neurological: She is alert and oriented to person, place, and time.  Skin: Skin is warm and dry. No rash noted.  Psychiatric: She has a normal mood and affect. Her behavior is normal. Thought content normal.  Nursing note and vitals reviewed. Addendum:  Improved air movement and subjective increase in ease of breathing after nebulizer tx.  BP 122/70   Pulse (!) 107   Temp 99 F (37.2 C)   Ht 5\' 4"  (1.626 m)   Wt 187 lb (84.8 kg)   SpO2 93%   BMI 32.10 kg/m   Assessment and Plan: 1. Bronchitis Finish zpak Continue inhalers  2. Bronchospasm with bronchitis, acute - albuterol (PROVENTIL) (2.5 MG/3ML) 0.083% nebulizer solution 2.5 mg; Take 3 mLs (2.5 mg total) by nebulization once. - predniSONE (DELTASONE) 10 MG tablet; Take 4 tablets (40 mg total) by mouth daily with breakfast.  Dispense: 12 tablet; Refill: 0   Meds ordered this encounter  Medications  . albuterol (PROVENTIL) (2.5 MG/3ML) 0.083% nebulizer solution 2.5 mg  . predniSONE (DELTASONE) 10 MG tablet    Sig: Take 4 tablets (40 mg total) by mouth daily with breakfast.    Dispense:  12 tablet    Refill:  0    Bari EdwardLaura Ryana Montecalvo, MD [facility]  05/31/2017

## 2017-06-01 ENCOUNTER — Other Ambulatory Visit: Payer: Self-pay | Admitting: Family Medicine

## 2017-06-01 DIAGNOSIS — J4520 Mild intermittent asthma, uncomplicated: Secondary | ICD-10-CM

## 2017-06-01 DIAGNOSIS — IMO0001 Reserved for inherently not codable concepts without codable children: Secondary | ICD-10-CM

## 2017-06-02 NOTE — Telephone Encounter (Signed)
Your patient 

## 2017-07-22 ENCOUNTER — Other Ambulatory Visit: Payer: Self-pay | Admitting: Internal Medicine

## 2017-07-22 ENCOUNTER — Ambulatory Visit (INDEPENDENT_AMBULATORY_CARE_PROVIDER_SITE_OTHER): Payer: 59 | Admitting: Internal Medicine

## 2017-07-22 ENCOUNTER — Encounter: Payer: Self-pay | Admitting: Internal Medicine

## 2017-07-22 VITALS — BP 132/82 | HR 86 | Resp 16 | Ht 64.0 in | Wt 190.2 lb

## 2017-07-22 DIAGNOSIS — J4520 Mild intermittent asthma, uncomplicated: Secondary | ICD-10-CM | POA: Diagnosis not present

## 2017-07-22 DIAGNOSIS — Z23 Encounter for immunization: Secondary | ICD-10-CM

## 2017-07-22 DIAGNOSIS — IMO0001 Reserved for inherently not codable concepts without codable children: Secondary | ICD-10-CM

## 2017-07-22 DIAGNOSIS — Z1231 Encounter for screening mammogram for malignant neoplasm of breast: Secondary | ICD-10-CM

## 2017-07-22 DIAGNOSIS — F39 Unspecified mood [affective] disorder: Secondary | ICD-10-CM

## 2017-07-22 MED ORDER — BECLOMETHASONE DIPROP HFA 40 MCG/ACT IN AERB: 1.0000 | INHALATION_SPRAY | Freq: Two times a day (BID) | RESPIRATORY_TRACT | 5 refills | Status: DC

## 2017-07-22 MED ORDER — BECLOMETHASONE DIPROPIONATE 40 MCG/ACT IN AERS: 1.0000 | INHALATION_SPRAY | Freq: Two times a day (BID) | RESPIRATORY_TRACT | 12 refills | Status: DC

## 2017-07-22 NOTE — Progress Notes (Signed)
Date:  07/22/2017   Name:  [name]   DOB:  Mar 05, 1961   MRN:  [id number]   Chief Complaint: Asthma Asthma  She complains of cough and wheezing. There is no shortness of breath. Pertinent negatives include no chest pain or fever. Her past medical history is significant for asthma.  Mood disorder - in 6 weeks ago with significant change in mood. Celexa was increased to 30 mg. She reports doing well mood is even and she's happy and stable. No side effects to the increased dose noted.   Review of Systems  Constitutional: Negative for chills, fatigue and fever.  Respiratory: Positive for cough and wheezing. Negative for chest tightness and shortness of breath.   Cardiovascular: Negative for chest pain and palpitations.  Psychiatric/Behavioral: Negative for dysphoric mood and sleep disturbance.    Patient Active Problem List   Diagnosis Date Noted  . Chronic tension-type headache, not intractable 01/17/2017  . Mood disorder (HCC) 07/13/2016  . Foot pain, left 07/13/2016  . Prediabetes 11/07/2015  . Hyperlipidemia 11/07/2015  . Low serum thyroid stimulating hormone (TSH) 09/30/2015  . Osteoarthritis 09/29/2015  . Menopausal syndrome (hot flashes) 09/29/2015  . Obesity, Class I, BMI 30-34.9 09/26/2015  . Vitamin D deficiency 09/26/2015  . Migraine without status migrainosus, not intractable 09/26/2015  . Moderate intermittent asthma without complication 09/26/2015  . Colon polyp 09/26/2015  . Synovial cyst of left knee 09/26/2015  . Allergic rhinitis 09/26/2015    Prior to Admission medications   Medication Sig Start Date End Date Taking? Authorizing Provider  acetaminophen (TYLENOL) 650 MG CR tablet Take 650 mg by mouth 2 (two) times daily.   Yes [provider]  albuterol (PROVENTIL HFA;VENTOLIN HFA) 108 (90 BASE) MCG/ACT inhaler Inhale 2 puffs into the lungs every 6 (six) hours as needed for wheezing or shortness of breath. 09/29/15  Yes Plonk, Chrissie Noa, MD    beclomethasone (QVAR) 40 MCG/ACT inhaler Inhale 1 puff into the lungs 2 (two) times daily.   Yes [provider]  citalopram (CELEXA) 20 MG tablet Take 1.5 tablets (30 mg total) by mouth daily. 05/13/17  Yes Reubin Milan, MD  Melatonin 10 MG CAPS Take 10 mg by mouth every other day.   Yes [provider]  montelukast (SINGULAIR) 10 MG tablet Take 1 tablet (10 mg total) by mouth at bedtime. 01/17/17  Yes Reubin Milan, MD  rizatriptan (MAXALT-MLT) 10 MG disintegrating tablet Take 1 tablet by mouth at headache onset may repeat once in 2 hours if needed.  No more than 2 tabs in 24 hours 02/19/16  Yes [provider]  topiramate (TOPAMAX) 50 MG tablet Take by mouth. 02/19/16  Yes [provider]  Vitamin D, Cholecalciferol, 1000 UNITS CAPS Take 2 capsules by mouth daily.   Yes [provider]  cetirizine (ZYRTEC) 10 MG tablet Take 10 mg by mouth daily.    [provider]  Multiple Vitamins-Minerals (MULTIVITAMIN ADULTS 50+ PO) Take 1 capsule by mouth daily.    [provider]    Allergies  Allergen Reactions  . Tape Rash    Past Surgical History:  Procedure Laterality Date  . ABDOMINAL HYSTERECTOMY    . APPENDECTOMY    . CESAREAN SECTION     x 1  . COLONOSCOPY  2012   due in 2017 for suspicious lesion  . COLONOSCOPY WITH PROPOFOL N/A 08/20/2016   Procedure: COLONOSCOPY WITH PROPOFOL;  Surgeon: Midge Minium, MD;  Location: [facility];  Service: Endoscopy;  Laterality: N/A;  . PARTIAL HYSTERECTOMY     one ovary left  . REPAIR ANKLE LIGAMENT     from fall  . ROTATOR CUFF REPAIR Left    shoulder  . TONSILLECTOMY      Social History  Substance Use Topics  . Smoking status: Never Smoker  . Smokeless tobacco: Never Used  . Alcohol use 0.0 oz/week     Comment: 2 drinks - 2x/mo     Medication list has been reviewed and updated.   Physical Exam  Constitutional: She is oriented to person, place, and time.  She appears well-developed. No distress.  HENT:  Head: Normocephalic and atraumatic.  Neck: Normal range of motion. Neck supple. No thyromegaly present.  Cardiovascular: Normal rate, regular rhythm and normal heart sounds.   Pulmonary/Chest: Effort normal and breath sounds normal. No respiratory distress. She has no wheezes.  Musculoskeletal: Normal range of motion.  Neurological: She is alert and oriented to person, place, and time.  Skin: Skin is warm and dry. No rash noted.  Psychiatric: She has a normal mood and affect. Her behavior is normal. Thought content normal.  Nursing note and vitals reviewed.   BP 132/82   Pulse 86   Resp 16   Ht 5\' 4"  (1.626 m)   Wt 190 lb 3.2 oz (86.3 kg)   SpO2 98%   BMI 32.65 kg/m   Assessment and Plan: 1. Mood disorder (HCC) Improved with increased dose of Celexa  2. Moderate intermittent asthma without complication Resume Qvar - beclomethasone (QVAR) 40 MCG/ACT inhaler; Inhale 1 puff into the lungs 2 (two) times daily.  Dispense: 1 Inhaler; Refill: 12  3. Need for influenza vaccination - Flu Vaccine QUAD 36+ mos IM   Meds ordered this encounter  Medications  . beclomethasone (QVAR) 40 MCG/ACT inhaler    Sig: Inhale 1 puff into the lungs 2 (two) times daily.    Dispense:  1 Inhaler    Refill:  12    Bari Edward, MD [facility]  07/22/2017

## 2017-09-23 ENCOUNTER — Ambulatory Visit
Admission: RE | Admit: 2017-09-23 | Discharge: 2017-09-23 | Disposition: A | Discharge status: Home / Self Care | Payer: 59 | Source: Ambulatory Visit | Attending: Internal Medicine | Admitting: Internal Medicine

## 2017-09-23 DIAGNOSIS — Z1231 Encounter for screening mammogram for malignant neoplasm of breast: Secondary | ICD-10-CM | POA: Insufficient documentation

## 2017-09-26 ENCOUNTER — Other Ambulatory Visit: Payer: Self-pay | Admitting: Internal Medicine

## 2017-09-26 DIAGNOSIS — N6320 Unspecified lump in the left breast, unspecified quadrant: Secondary | ICD-10-CM

## 2017-09-26 DIAGNOSIS — R928 Other abnormal and inconclusive findings on diagnostic imaging of breast: Secondary | ICD-10-CM

## 2017-10-19 ENCOUNTER — Ambulatory Visit
Admission: RE | Admit: 2017-10-19 | Discharge: 2017-10-19 | Disposition: A | Discharge status: Home / Self Care | Payer: 59 | Source: Ambulatory Visit | Attending: Internal Medicine | Admitting: Internal Medicine

## 2017-10-19 DIAGNOSIS — N6323 Unspecified lump in the left breast, lower outer quadrant: Secondary | ICD-10-CM | POA: Diagnosis not present

## 2017-10-19 DIAGNOSIS — N6320 Unspecified lump in the left breast, unspecified quadrant: Secondary | ICD-10-CM

## 2017-10-19 DIAGNOSIS — R928 Other abnormal and inconclusive findings on diagnostic imaging of breast: Secondary | ICD-10-CM

## 2017-11-12 ENCOUNTER — Other Ambulatory Visit: Payer: Self-pay | Admitting: Internal Medicine

## 2017-11-12 DIAGNOSIS — F39 Unspecified mood [affective] disorder: Secondary | ICD-10-CM

## 2017-12-05 ENCOUNTER — Encounter: Payer: Self-pay | Admitting: Internal Medicine

## 2017-12-05 ENCOUNTER — Ambulatory Visit (INDEPENDENT_AMBULATORY_CARE_PROVIDER_SITE_OTHER): Payer: 59 | Admitting: Internal Medicine

## 2017-12-05 VITALS — BP 122/78 | HR 72 | Temp 98.2°F | Ht 64.0 in | Wt 195.0 lb

## 2017-12-05 DIAGNOSIS — N3000 Acute cystitis without hematuria: Secondary | ICD-10-CM | POA: Diagnosis not present

## 2017-12-05 LAB — POC URINALYSIS WITH MICROSCOPIC (NON AUTO)MANUAL RESULT
BILIRUBIN UA: NEGATIVE
Epithelial cells, urine per micros: 1
Glucose, UA: NEGATIVE
KETONES UA: NEGATIVE
MUCUS UA: 0
NITRITE UA: NEGATIVE
PROTEIN UA: NEGATIVE
RBC: 2 M/uL — AB (ref 4.04–5.48)
SPEC GRAV UA: 1.01 (ref 1.010–1.025)
UROBILINOGEN UA: 0.2 U/dL
WBC CASTS UA: 2
pH, UA: 7.5 (ref 5.0–8.0)

## 2017-12-05 MED ORDER — CIPROFLOXACIN HCL 250 MG PO TABS: 250.0000 mg | ORAL_TABLET | Freq: Two times a day (BID) | ORAL | 0 refills | Status: AC

## 2017-12-05 NOTE — Progress Notes (Signed)
Date:  12/05/2017   Name:  [name]   DOB:  03/09/1961   MRN:  [id number]   Chief Complaint: Nausea (Hot and cold, nausea with no vomit or diarrhea. No energy. Headache. Post nasal drip, )  HPI Pt had onset of fatigue, nausea without vomiting, chills and possible low grade fever (did not measure) since yesterday.  Slept all day yesterday and did not eat or drink.  Today ate some toast and kept it down.  Still has nausea.  No cough or sore throat.  Stools are normal. She has taken nothing for the sx. No exposures to sick people.  No new medications.     Review of Systems  Constitutional: Positive for chills and fatigue. Negative for fever.  HENT: Negative for congestion, sinus pressure and sore throat.   Eyes: Negative for visual disturbance.  Respiratory: Negative for chest tightness, shortness of breath and wheezing.   Cardiovascular: Negative for chest pain.  Gastrointestinal: Positive for nausea. Negative for abdominal pain, constipation and diarrhea.  Genitourinary: Negative for dysuria, frequency and urgency.  Musculoskeletal: Negative for arthralgias.  Hematological: Negative for adenopathy.    Patient Active Problem List   Diagnosis Date Noted  . Chronic tension-type headache, not intractable 01/17/2017  . Mood disorder (HCC) 07/13/2016  . Foot pain, left 07/13/2016  . Prediabetes 11/07/2015  . Hyperlipidemia 11/07/2015  . Low serum thyroid stimulating hormone (TSH) 09/30/2015  . Osteoarthritis 09/29/2015  . Menopausal syndrome (hot flashes) 09/29/2015  . Obesity, Class I, BMI 30-34.9 09/26/2015  . Vitamin D deficiency 09/26/2015  . Migraine without status migrainosus, not intractable 09/26/2015  . Moderate intermittent asthma without complication 09/26/2015  . Colon polyp 09/26/2015  . Synovial cyst of left knee 09/26/2015  . Allergic rhinitis 09/26/2015    Prior to Admission medications   Medication Sig Start Date End Date Taking? Authorizing Provider    acetaminophen (TYLENOL) 650 MG CR tablet Take 650 mg by mouth 2 (two) times daily.   Yes [provider]  albuterol (PROVENTIL HFA;VENTOLIN HFA) 108 (90 BASE) MCG/ACT inhaler Inhale 2 puffs into the lungs every 6 (six) hours as needed for wheezing or shortness of breath. 09/29/15  Yes Plonk, Chrissie NoaWilliam, MD  beclomethasone (QVAR REDIHALER) 40 MCG/ACT inhaler Inhale 1 puff into the lungs 2 (two) times daily. 07/22/17  Yes Reubin MilanBerglund, Niti Leisure H, MD  citalopram (CELEXA) 20 MG tablet TAKE 1 & 1/2 (ONE & ONE-HALF) TABLETS BY MOUTH ONCE DAILY 11/12/17  Yes Reubin MilanBerglund, Laityn Bensen H, MD  loratadine (CLARITIN) 10 MG tablet Take 10 mg by mouth daily.   Yes [provider]  montelukast (SINGULAIR) 10 MG tablet Take 1 tablet (10 mg total) by mouth at bedtime. 01/17/17  Yes Reubin MilanBerglund, Lucendia Leard H, MD  Multiple Vitamins-Minerals (MULTIVITAMIN ADULTS 50+ PO) Take 1 capsule by mouth daily.   Yes [provider]  rizatriptan (MAXALT-MLT) 10 MG disintegrating tablet Take 1 tablet by mouth at headache onset may repeat once in 2 hours if needed.  No more than 2 tabs in 24 hours 02/19/16  Yes [provider]  topiramate (TOPAMAX) 50 MG tablet Take by mouth. 02/19/16  Yes [provider]  Vitamin D, Cholecalciferol, 1000 UNITS CAPS Take 2 capsules by mouth daily.   Yes [provider]  Melatonin 10 MG CAPS Take 10 mg by mouth every other day.    [provider]    Allergies  Allergen Reactions  . Tape Rash    Past Surgical History:  Procedure Laterality  Date  . ABDOMINAL HYSTERECTOMY    . APPENDECTOMY    . CESAREAN SECTION     x 1  . COLONOSCOPY  2012   due in 2017 for suspicious lesion  . COLONOSCOPY WITH PROPOFOL N/A 08/20/2016   Procedure: COLONOSCOPY WITH PROPOFOL;  Surgeon: Midge Minium, MD;  Location: [facility];  Service: Endoscopy;  Laterality: N/A;  . PARTIAL HYSTERECTOMY     one ovary left  . REPAIR ANKLE LIGAMENT     from fall  . ROTATOR CUFF  REPAIR Left    shoulder  . TONSILLECTOMY      Social History   Tobacco Use  . Smoking status: Never Smoker  . Smokeless tobacco: Never Used  Substance Use Topics  . Alcohol use: Yes    Alcohol/week: 0.0 oz    Comment: 2 drinks - 2x/mo  . Drug use: No     Medication list has been reviewed and updated.  PHQ 2/9 Scores 05/13/2017  PHQ - 2 Score 3  PHQ- 9 Score 14    Physical Exam  Constitutional: She is oriented to person, place, and time. She appears well-developed. She has a sickly appearance. No distress.  HENT:  Head: Normocephalic and atraumatic.  Right Ear: Tympanic membrane and ear canal normal.  Left Ear: Tympanic membrane and ear canal normal.  Nose: Right sinus exhibits no maxillary sinus tenderness and no frontal sinus tenderness. Left sinus exhibits no maxillary sinus tenderness and no frontal sinus tenderness.  Mouth/Throat: No posterior oropharyngeal edema or posterior oropharyngeal erythema.  Cardiovascular: Normal rate, regular rhythm, S1 normal and normal heart sounds.  Pulmonary/Chest: Effort normal and breath sounds normal. No respiratory distress. She has no wheezes. She has no rhonchi.  Abdominal: Soft. Normal appearance and bowel sounds are normal. There is tenderness in the suprapubic area. There is no CVA tenderness.  Musculoskeletal: Normal range of motion.  Neurological: She is alert and oriented to person, place, and time.  Skin: Skin is warm and dry. No rash noted.  Psychiatric: She has a normal mood and affect. Her speech is normal and behavior is normal. Thought content normal.  Nursing note and vitals reviewed.   BP 122/78   Pulse 72   Temp 98.2 F (36.8 C) (Oral)   Ht 5\' 4"  (1.626 m)   Wt 195 lb (88.5 kg)   SpO2 97%   BMI 33.47 kg/m   Assessment and Plan: 1. Acute cystitis without hematuria Increase fluids intake Cipro x 5 days - POC urinalysis w microscopic (non auto)   No orders of the defined types were placed in this  encounter.   Partially dictated using Animal nutritionist. Any errors are unintentional.  Bari Edward, MD [facility]  12/05/2017

## 2017-12-30 ENCOUNTER — Ambulatory Visit (INDEPENDENT_AMBULATORY_CARE_PROVIDER_SITE_OTHER): Payer: 59 | Admitting: Internal Medicine

## 2017-12-30 ENCOUNTER — Encounter: Payer: Self-pay | Admitting: Internal Medicine

## 2017-12-30 VITALS — BP 128/88 | HR 86 | Ht 64.0 in | Wt 199.0 lb

## 2017-12-30 DIAGNOSIS — Z6834 Body mass index (BMI) 34.0-34.9, adult: Secondary | ICD-10-CM | POA: Diagnosis not present

## 2017-12-30 DIAGNOSIS — E669 Obesity, unspecified: Secondary | ICD-10-CM

## 2017-12-30 DIAGNOSIS — F39 Unspecified mood [affective] disorder: Secondary | ICD-10-CM | POA: Diagnosis not present

## 2017-12-30 NOTE — Progress Notes (Signed)
Date:  12/30/2017   Name:  [name]   DOB:  12/26/1960   MRN:  [id number]   Chief Complaint: Weight Gain (Wants to discuss how she can lose weight. )  Weight gain - she has been slowing gaining weight and does not know which direction to go.  She has never dieted but generally follows a healthy diet. She exercises on the weekends but not during the week when she is traveling.  Review of Systems  Constitutional: Positive for unexpected weight change. Negative for chills, fatigue and fever.  Respiratory: Negative for chest tightness, shortness of breath and wheezing.   Cardiovascular: Negative for chest pain.    Patient Active Problem List   Diagnosis Date Noted  . Chronic tension-type headache, not intractable 01/17/2017  . Mood disorder (HCC) 07/13/2016  . Foot pain, left 07/13/2016  . Prediabetes 11/07/2015  . Hyperlipidemia 11/07/2015  . Low serum thyroid stimulating hormone (TSH) 09/30/2015  . Osteoarthritis 09/29/2015  . Menopausal syndrome (hot flashes) 09/29/2015  . Obesity, Class I, BMI 30-34.9 09/26/2015  . Vitamin D deficiency 09/26/2015  . Migraine without status migrainosus, not intractable 09/26/2015  . Moderate intermittent asthma without complication 09/26/2015  . Colon polyp 09/26/2015  . Synovial cyst of left knee 09/26/2015  . Allergic rhinitis 09/26/2015    Prior to Admission medications   Medication Sig Start Date End Date Taking? Authorizing Provider  acetaminophen (TYLENOL) 650 MG CR tablet Take 650 mg by mouth 2 (two) times daily.   Yes [provider]  albuterol (PROVENTIL HFA;VENTOLIN HFA) 108 (90 BASE) MCG/ACT inhaler Inhale 2 puffs into the lungs every 6 (six) hours as needed for wheezing or shortness of breath. 09/29/15  Yes Plonk, Chrissie NoaWilliam, MD  beclomethasone (QVAR REDIHALER) 40 MCG/ACT inhaler Inhale 1 puff into the lungs 2 (two) times daily. 07/22/17  Yes Reubin MilanBerglund, Savanah Bayles H, MD  citalopram (CELEXA) 20 MG tablet TAKE 1 & 1/2 (ONE &  ONE-HALF) TABLETS BY MOUTH ONCE DAILY 11/12/17  Yes Reubin MilanBerglund, Jancarlo Biermann H, MD  cyanocobalamin 500 MCG tablet Take 500 mcg by mouth daily.   Yes [provider]  loratadine (CLARITIN) 10 MG tablet Take 10 mg by mouth daily.   Yes [provider]  Melatonin 10 MG CAPS Take 10 mg by mouth every other day.   Yes [provider]  Multiple Vitamins-Minerals (MULTIVITAMIN ADULTS 50+ PO) Take 1 capsule by mouth daily.   Yes [provider]  rizatriptan (MAXALT-MLT) 10 MG disintegrating tablet Take 1 tablet by mouth at headache onset may repeat once in 2 hours if needed.  No more than 2 tabs in 24 hours 02/19/16  Yes [provider]  topiramate (TOPAMAX) 50 MG tablet Take by mouth. 02/19/16  Yes [provider]  Vitamin D, Cholecalciferol, 1000 UNITS CAPS Take 2 capsules by mouth daily.   Yes [provider]  montelukast (SINGULAIR) 10 MG tablet Take 1 tablet (10 mg total) by mouth at bedtime. Patient not taking: Reported on 12/30/2017 01/17/17   Reubin MilanBerglund, Starlett Pehrson H, MD    Allergies  Allergen Reactions  . Clavulanic Acid Diarrhea  . Tape Rash    Past Surgical History:  Procedure Laterality Date  . ABDOMINAL HYSTERECTOMY    . APPENDECTOMY    . CESAREAN SECTION     x 1  . COLONOSCOPY  2012   due in 2017 for suspicious lesion  . COLONOSCOPY WITH PROPOFOL N/A 08/20/2016   Procedure: COLONOSCOPY WITH PROPOFOL;  Surgeon: Midge Miniumarren Wohl, MD;  Location: [facility];  Service: Endoscopy;  Laterality: N/A;  . PARTIAL HYSTERECTOMY     one ovary left  . REPAIR ANKLE LIGAMENT     from fall  . ROTATOR CUFF REPAIR Left    shoulder  . TONSILLECTOMY      Social History   Tobacco Use  . Smoking status: Never Smoker  . Smokeless tobacco: Never Used  Substance Use Topics  . Alcohol use: Yes    Alcohol/week: 0.0 oz    Comment: 2 drinks - 2x/mo  . Drug use: No     Medication list has been reviewed and updated.  PHQ 2/9 Scores 05/13/2017    PHQ - 2 Score 3  PHQ- 9 Score 14    Physical Exam  Constitutional: She is oriented to person, place, and time. She appears well-developed. No distress.  HENT:  Head: Normocephalic and atraumatic.  Neck: Normal range of motion. Neck supple.  Cardiovascular: Normal rate, regular rhythm and normal heart sounds.  Pulmonary/Chest: Effort normal and breath sounds normal. No respiratory distress. She has no wheezes.  Musculoskeletal: Normal range of motion. She exhibits no edema.  Neurological: She is alert and oriented to person, place, and time.  Skin: Skin is warm and dry. No rash noted.  Psychiatric: She has a normal mood and affect. Her behavior is normal. Thought content normal.  Nursing note and vitals reviewed.   BP 128/88   Pulse 86   Ht 5\' 4"  (1.626 m)   Wt 199 lb (90.3 kg)   SpO2 97%   BMI 34.16 kg/m   Assessment and Plan: 1. BMI 34.0-34.9,adult Recommend [employer] and possibly intermittent fasting - Comprehensive metabolic panel - Hemoglobin A1c - Lipid panel  2. Mood disorder (HCC) Doing well on Celexa - TSH   No orders of the defined types were placed in this encounter.   Partially dictated using Animal nutritionist. Any errors are unintentional.  Bari Edward, MD [facility]  12/30/2017

## 2017-12-30 NOTE — Patient Instructions (Signed)
Dr Ollen GrossPaul's  Intermittent fasting  Weight watchers Online

## 2017-12-31 LAB — LIPID PANEL
CHOLESTEROL TOTAL: 230 mg/dL — AB (ref 100–199)
Chol/HDL Ratio: 3.7 ratio (ref 0.0–4.4)
HDL: 62 mg/dL (ref >39)
LDL Calculated: 132 mg/dL — ABNORMAL HIGH (ref 0–99)
Triglycerides: 180 mg/dL — ABNORMAL HIGH (ref 0–149)
VLDL CHOLESTEROL CAL: 36 mg/dL (ref 5–40)

## 2017-12-31 LAB — COMPREHENSIVE METABOLIC PANEL
ALT: 16 IU/L (ref 0–32)
AST: 18 IU/L (ref 0–40)
Albumin/Globulin Ratio: 1.7 (ref 1.2–2.2)
Albumin: 4.3 g/dL (ref 3.5–5.5)
Alkaline Phosphatase: 79 IU/L (ref 39–117)
BUN/Creatinine Ratio: 13 (ref 9–23)
BUN: 11 mg/dL (ref 6–24)
Bilirubin Total: 0.3 mg/dL (ref 0.0–1.2)
CO2: 22 mmol/L (ref 20–29)
CREATININE: 0.83 mg/dL (ref 0.57–1.00)
Calcium: 9.5 mg/dL (ref 8.7–10.2)
Chloride: 106 mmol/L (ref 96–106)
GFR calc Af Amer: 91 mL/min/{1.73_m2} (ref >59)
GFR calc non Af Amer: 79 mL/min/{1.73_m2} (ref >59)
Globulin, Total: 2.5 g/dL (ref 1.5–4.5)
Glucose: 90 mg/dL (ref 65–99)
Potassium: 3.7 mmol/L (ref 3.5–5.2)
Sodium: 142 mmol/L (ref 134–144)
Total Protein: 6.8 g/dL (ref 6.0–8.5)

## 2017-12-31 LAB — TSH: TSH: 1.11 u[IU]/mL (ref 0.450–4.500)

## 2017-12-31 LAB — HEMOGLOBIN A1C
ESTIMATED AVERAGE GLUCOSE: 123 mg/dL
Hgb A1c MFr Bld: 5.9 % — ABNORMAL HIGH (ref 4.8–5.6)

## 2018-02-03 ENCOUNTER — Encounter: Payer: Self-pay | Admitting: Internal Medicine

## 2018-02-03 ENCOUNTER — Ambulatory Visit (INDEPENDENT_AMBULATORY_CARE_PROVIDER_SITE_OTHER): Payer: 59 | Admitting: Internal Medicine

## 2018-02-03 VITALS — BP 142/80 | HR 74 | Ht 64.0 in | Wt 186.0 lb

## 2018-02-03 DIAGNOSIS — F39 Unspecified mood [affective] disorder: Secondary | ICD-10-CM

## 2018-02-03 DIAGNOSIS — J4520 Mild intermittent asthma, uncomplicated: Secondary | ICD-10-CM | POA: Diagnosis not present

## 2018-02-03 DIAGNOSIS — Z0000 Encounter for general adult medical examination without abnormal findings: Secondary | ICD-10-CM

## 2018-02-03 DIAGNOSIS — R7303 Prediabetes: Secondary | ICD-10-CM | POA: Diagnosis not present

## 2018-02-03 DIAGNOSIS — IMO0001 Reserved for inherently not codable concepts without codable children: Secondary | ICD-10-CM

## 2018-02-03 DIAGNOSIS — Z23 Encounter for immunization: Secondary | ICD-10-CM

## 2018-02-03 DIAGNOSIS — Z0001 Encounter for general adult medical examination with abnormal findings: Secondary | ICD-10-CM

## 2018-02-03 DIAGNOSIS — E782 Mixed hyperlipidemia: Secondary | ICD-10-CM | POA: Diagnosis not present

## 2018-02-03 LAB — POCT URINALYSIS DIPSTICK
Bilirubin, UA: NEGATIVE
Blood, UA: NEGATIVE
Glucose, UA: NEGATIVE
KETONES UA: NEGATIVE
LEUKOCYTES UA: NEGATIVE
NITRITE UA: NEGATIVE
PH UA: 7.5 (ref 5.0–8.0)
UROBILINOGEN UA: 0.2 U/dL

## 2018-02-03 NOTE — Progress Notes (Signed)
Date:  02/03/2018   Name:  [name]   DOB:  11/12/1961   MRN:  [id number]   Chief Complaint: Annual Exam (Breast Exam. ) [name] is a 57 y.o. female who presents today for her Complete Annual Exam. She feels well. She reports exercising some of the time. She reports she is sleeping well.   Asthma  She complains of wheezing. There is no cough or shortness of breath. This is a recurrent problem. The problem occurs intermittently. Pertinent negatives include no chest pain, fever, headaches or trouble swallowing. Her past medical history is significant for asthma.  Hyperlipidemia  This is a chronic problem. Pertinent negatives include no chest pain or shortness of breath. Current antihyperlipidemic treatment includes diet change and exercise.  Pre-diabetes. - has been working hard on diet and weight loss and has lost 13 lbs using the [employer] phone app.  Mood disorder - taking Lexapro with good results.  Review of Systems  Constitutional: Negative for chills, fatigue and fever.  HENT: Negative for congestion, hearing loss, tinnitus, trouble swallowing and voice change.   Eyes: Negative for visual disturbance.  Respiratory: Positive for wheezing. Negative for cough, chest tightness and shortness of breath.   Cardiovascular: Negative for chest pain, palpitations and leg swelling.  Gastrointestinal: Negative for abdominal pain, constipation, diarrhea and vomiting.  Endocrine: Negative for polydipsia and polyuria.  Genitourinary: Negative for dysuria, frequency, genital sores, vaginal bleeding and vaginal discharge.  Musculoskeletal: Negative for arthralgias, gait problem and joint swelling.  Skin: Negative for color change and rash.  Neurological: Negative for dizziness, tremors, light-headedness and headaches.  Hematological: Negative for adenopathy. Does not bruise/bleed easily.  Psychiatric/Behavioral: Negative for dysphoric mood and sleep disturbance. The patient is not  nervous/anxious.     Patient Active Problem List   Diagnosis Date Noted  . Chronic tension-type headache, not intractable 01/17/2017  . Mood disorder (HCC) 07/13/2016  . Foot pain, left 07/13/2016  . Prediabetes 11/07/2015  . Hyperlipidemia 11/07/2015  . Low serum thyroid stimulating hormone (TSH) 09/30/2015  . Osteoarthritis 09/29/2015  . Menopausal syndrome (hot flashes) 09/29/2015  . Obesity, Class I, BMI 30-34.9 09/26/2015  . Vitamin D deficiency 09/26/2015  . Migraine without status migrainosus, not intractable 09/26/2015  . Moderate intermittent asthma without complication 09/26/2015  . Colon polyp 09/26/2015  . Synovial cyst of left knee 09/26/2015  . Allergic rhinitis 09/26/2015    Prior to Admission medications   Medication Sig Start Date End Date Taking? Authorizing Provider  acetaminophen (TYLENOL) 650 MG CR tablet Take 650 mg by mouth 2 (two) times daily.   Yes [provider]  albuterol (PROVENTIL HFA;VENTOLIN HFA) 108 (90 BASE) MCG/ACT inhaler Inhale 2 puffs into the lungs every 6 (six) hours as needed for wheezing or shortness of breath. 09/29/15  Yes Plonk, Chrissie NoaWilliam, MD  beclomethasone (QVAR REDIHALER) 40 MCG/ACT inhaler Inhale 1 puff into the lungs 2 (two) times daily. 07/22/17  Yes Reubin MilanBerglund, Sarvesh Meddaugh H, MD  citalopram (CELEXA) 20 MG tablet TAKE 1 & 1/2 (ONE & ONE-HALF) TABLETS BY MOUTH ONCE DAILY 11/12/17  Yes Reubin MilanBerglund, Sonali Wivell H, MD  cyanocobalamin 500 MCG tablet Take 500 mcg by mouth daily.   Yes [provider]  loratadine (CLARITIN) 10 MG tablet Take 10 mg by mouth daily.   Yes [provider]  Melatonin 10 MG CAPS Take 10 mg by mouth every other day.   Yes [provider]  montelukast (SINGULAIR) 10 MG tablet Take 1 tablet (10 mg total) by  mouth at bedtime. 01/17/17  Yes Reubin Milan, MD  Multiple Vitamins-Minerals (MULTIVITAMIN ADULTS 50+ PO) Take 1 capsule by mouth daily.   Yes [provider]  rizatriptan  (MAXALT-MLT) 10 MG disintegrating tablet Take 1 tablet by mouth at headache onset may repeat once in 2 hours if needed.  No more than 2 tabs in 24 hours 02/19/16  Yes [provider]  topiramate (TOPAMAX) 50 MG tablet Take by mouth. 02/19/16  Yes [provider]  Vitamin D, Cholecalciferol, 1000 UNITS CAPS Take 2 capsules by mouth daily.   Yes [provider]    Allergies  Allergen Reactions  . Clavulanic Acid Diarrhea  . Tape Rash    Past Surgical History:  Procedure Laterality Date  . ABDOMINAL HYSTERECTOMY    . APPENDECTOMY    . CESAREAN SECTION     x 1  . COLONOSCOPY  2012   due in 2017 for suspicious lesion  . COLONOSCOPY WITH PROPOFOL N/A 08/20/2016   Procedure: COLONOSCOPY WITH PROPOFOL;  Surgeon: Midge Minium, MD;  [facility];  Service: Endoscopy;  Laterality: N/A;  . PARTIAL HYSTERECTOMY     one ovary left  . REPAIR ANKLE LIGAMENT     from fall  . ROTATOR CUFF REPAIR Left    shoulder  . TONSILLECTOMY      Social History   Tobacco Use  . Smoking status: Never Smoker  . Smokeless tobacco: Never Used  Substance Use Topics  . Alcohol use: Yes    Alcohol/week: 0.0 oz    Comment: 2 drinks - 2x/mo  . Drug use: No     Medication list has been reviewed and updated.  PHQ 2/9 Scores 05/13/2017  PHQ - 2 Score 3  PHQ- 9 Score 14    Physical Exam  Constitutional: She is oriented to person, place, and time. She appears well-developed and well-nourished. No distress.  HENT:  Head: Normocephalic and atraumatic.  Right Ear: Tympanic membrane and ear canal normal.  Left Ear: Tympanic membrane and ear canal normal.  Nose: Right sinus exhibits no maxillary sinus tenderness. Left sinus exhibits no maxillary sinus tenderness.  Mouth/Throat: Uvula is midline and oropharynx is clear and moist.  Eyes: Conjunctivae and EOM are normal. Right eye exhibits no discharge. Left eye exhibits no discharge. No scleral icterus.  Neck: Normal  range of motion. Carotid bruit is not present. No erythema present. No thyromegaly present.  Cardiovascular: Normal rate, regular rhythm, normal heart sounds and normal pulses.  Pulmonary/Chest: Effort normal. No respiratory distress. She has no wheezes. Right breast exhibits no mass, no nipple discharge, no skin change and no tenderness. Left breast exhibits no mass, no nipple discharge, no skin change and no tenderness.  Abdominal: Soft. Bowel sounds are normal. There is no hepatosplenomegaly. There is no tenderness. There is no CVA tenderness.  Musculoskeletal: Normal range of motion. She exhibits no edema.  Lymphadenopathy:    She has no cervical adenopathy.    She has no axillary adenopathy.  Neurological: She is alert and oriented to person, place, and time. She has normal reflexes. No cranial nerve deficit or sensory deficit.  Skin: Skin is warm, dry and intact. No rash noted.  Psychiatric: She has a normal mood and affect. Her speech is normal and behavior is normal. Thought content normal.  Nursing note and vitals reviewed.   BP (!) 142/80   Pulse 74   Ht 5\' 4"  (1.626 m)   Wt 186 lb (84.4 kg)  SpO2 98%   BMI 31.93 kg/m   Assessment and Plan: 1. Annual physical exam Continue diet and exercise - CBC with Differential/Platelet - Comprehensive metabolic panel - TSH - POCT urinalysis dipstick  2. Mood disorder (HCC) Continue Lexapro  3. Prediabetes - Hemoglobin A1c  4. Mixed hyperlipidemia - Lipid panel  5. Moderate intermittent asthma without complication Refilled Singulair and QVar  6. Need for diphtheria-tetanus-pertussis (Tdap) vaccine Vaccine was not given - patient can return at any time for this   No orders of the defined types were placed in this encounter.   Partially dictated using Animal nutritionist. Any errors are unintentional.  Bari Edward, MD [facility]  02/03/2018

## 2018-02-04 LAB — CBC WITH DIFFERENTIAL/PLATELET
BASOS ABS: 0 10*3/uL (ref 0.0–0.2)
Basos: 0 %
EOS (ABSOLUTE): 0.1 10*3/uL (ref 0.0–0.4)
Eos: 2 %
Hematocrit: 42.6 % (ref 34.0–46.6)
Hemoglobin: 14.2 g/dL (ref 11.1–15.9)
Immature Grans (Abs): 0 10*3/uL (ref 0.0–0.1)
Immature Granulocytes: 0 %
LYMPHS ABS: 2.3 10*3/uL (ref 0.7–3.1)
Lymphs: 35 %
MCH: 30 pg (ref 26.6–33.0)
MCHC: 33.3 g/dL (ref 31.5–35.7)
MCV: 90 fL (ref 79–97)
Monocytes Absolute: 0.3 10*3/uL (ref 0.1–0.9)
Monocytes: 5 %
NEUTROS ABS: 3.8 10*3/uL (ref 1.4–7.0)
Neutrophils: 58 %
Platelets: 320 10*3/uL (ref 150–379)
RBC: 4.74 x10E6/uL (ref 3.77–5.28)
RDW: 14.4 % (ref 12.3–15.4)
WBC: 6.6 10*3/uL (ref 3.4–10.8)

## 2018-02-04 LAB — LIPID PANEL
CHOL/HDL RATIO: 3.4 ratio (ref 0.0–4.4)
CHOLESTEROL TOTAL: 211 mg/dL — AB (ref 100–199)
HDL: 62 mg/dL (ref >39)
LDL CALC: 128 mg/dL — AB (ref 0–99)
TRIGLYCERIDES: 103 mg/dL (ref 0–149)
VLDL Cholesterol Cal: 21 mg/dL (ref 5–40)

## 2018-02-04 LAB — COMPREHENSIVE METABOLIC PANEL
ALBUMIN: 4.6 g/dL (ref 3.5–5.5)
ALK PHOS: 69 IU/L (ref 39–117)
ALT: 13 IU/L (ref 0–32)
AST: 12 IU/L (ref 0–40)
Albumin/Globulin Ratio: 2 (ref 1.2–2.2)
BILIRUBIN TOTAL: 0.3 mg/dL (ref 0.0–1.2)
BUN / CREAT RATIO: 13 (ref 9–23)
BUN: 13 mg/dL (ref 6–24)
CHLORIDE: 106 mmol/L (ref 96–106)
CO2: 25 mmol/L (ref 20–29)
CREATININE: 1 mg/dL (ref 0.57–1.00)
Calcium: 9.5 mg/dL (ref 8.7–10.2)
GFR calc Af Amer: 72 mL/min/{1.73_m2} (ref >59)
GFR calc non Af Amer: 62 mL/min/{1.73_m2} (ref >59)
GLUCOSE: 104 mg/dL — AB (ref 65–99)
Globulin, Total: 2.3 g/dL (ref 1.5–4.5)
Potassium: 4.1 mmol/L (ref 3.5–5.2)
SODIUM: 144 mmol/L (ref 134–144)
Total Protein: 6.9 g/dL (ref 6.0–8.5)

## 2018-02-04 LAB — HEMOGLOBIN A1C
Est. average glucose Bld gHb Est-mCnc: 117 mg/dL
Hgb A1c MFr Bld: 5.7 % — ABNORMAL HIGH (ref 4.8–5.6)

## 2018-02-04 LAB — TSH: TSH: 0.776 u[IU]/mL (ref 0.450–4.500)

## 2018-05-23 ENCOUNTER — Other Ambulatory Visit: Payer: Self-pay | Admitting: Internal Medicine

## 2018-05-23 DIAGNOSIS — F39 Unspecified mood [affective] disorder: Secondary | ICD-10-CM

## 2018-05-27 ENCOUNTER — Encounter: Payer: Self-pay | Admitting: Internal Medicine

## 2018-05-30 ENCOUNTER — Other Ambulatory Visit: Payer: Self-pay | Admitting: Internal Medicine

## 2018-05-30 DIAGNOSIS — N6002 Solitary cyst of left breast: Secondary | ICD-10-CM

## 2018-07-31 ENCOUNTER — Other Ambulatory Visit: Payer: Self-pay | Admitting: Internal Medicine

## 2018-07-31 ENCOUNTER — Encounter: Payer: Self-pay | Admitting: Internal Medicine

## 2018-07-31 MED ORDER — RIZATRIPTAN BENZOATE 10 MG PO TBDP: ORAL_TABLET | ORAL | 5 refills | Status: DC

## 2018-08-01 ENCOUNTER — Other Ambulatory Visit: Payer: Self-pay

## 2018-08-01 MED ORDER — RIZATRIPTAN BENZOATE 10 MG PO TBDP: ORAL_TABLET | ORAL | 5 refills | Status: DC

## 2018-11-26 ENCOUNTER — Encounter: Payer: Self-pay | Admitting: Internal Medicine

## 2018-11-26 ENCOUNTER — Other Ambulatory Visit: Payer: Self-pay | Admitting: Internal Medicine

## 2018-11-26 DIAGNOSIS — F39 Unspecified mood [affective] disorder: Secondary | ICD-10-CM

## 2018-12-25 ENCOUNTER — Encounter: Payer: Self-pay | Admitting: Internal Medicine

## 2018-12-25 NOTE — Telephone Encounter (Signed)
Please Advise patient response about nebulizer .Marland KitchenMarland Kitchen

## 2018-12-29 LAB — LIPID PANEL
Cholesterol: 218 — AB (ref 0–200)
HDL: 61 (ref 35–70)
LDL Cholesterol: 136
Triglycerides: 105 (ref 40–160)

## 2018-12-29 LAB — BASIC METABOLIC PANEL: GLUCOSE: 101

## 2019-01-05 ENCOUNTER — Other Ambulatory Visit: Payer: Self-pay

## 2019-01-05 ENCOUNTER — Ambulatory Visit (INDEPENDENT_AMBULATORY_CARE_PROVIDER_SITE_OTHER): Payer: Managed Care, Other (non HMO) | Admitting: Internal Medicine

## 2019-01-05 ENCOUNTER — Encounter: Payer: Self-pay | Admitting: Internal Medicine

## 2019-01-05 VITALS — BP 128/70 | HR 57 | Ht 64.0 in | Wt 174.0 lb

## 2019-01-05 DIAGNOSIS — IMO0001 Reserved for inherently not codable concepts without codable children: Secondary | ICD-10-CM

## 2019-01-05 DIAGNOSIS — J4520 Mild intermittent asthma, uncomplicated: Secondary | ICD-10-CM

## 2019-01-05 MED ORDER — ALBUTEROL SULFATE (2.5 MG/3ML) 0.083% IN NEBU
2.5000 mg | INHALATION_SOLUTION | Freq: Four times a day (QID) | RESPIRATORY_TRACT | 1 refills | Status: DC | PRN
Start: 2019-01-05 — End: 2024-02-14

## 2019-01-05 MED ORDER — BUDESONIDE 0.5 MG/2ML IN SUSP: 0.5000 mg | Freq: Two times a day (BID) | RESPIRATORY_TRACT | 12 refills | Status: DC

## 2019-01-05 NOTE — Progress Notes (Signed)
Date:  01/05/2019   Name:  [name]   DOB:  06/18/1961   MRN:  [id number]   Chief Complaint: Asthma (Patient would like nebulizer machine, and medication for it. )  Asthma  She complains of chest tightness, shortness of breath and wheezing. This is a recurrent problem. Pertinent negatives include no chest pain, fever or headaches. Her symptoms are alleviated by steroid inhaler and beta-agonist. Her past medical history is significant for asthma.    Review of Systems  Constitutional: Negative for chills, fatigue, fever and unexpected weight change.  Respiratory: Positive for shortness of breath and wheezing. Negative for choking.   Cardiovascular: Negative for chest pain and palpitations.  Musculoskeletal: Negative for arthralgias.  Allergic/Immunologic: Positive for environmental allergies.  Neurological: Negative for dizziness, light-headedness and headaches.  Psychiatric/Behavioral: Negative for sleep disturbance.    Patient Active Problem List   Diagnosis Date Noted  . Chronic tension-type headache, not intractable 01/17/2017  . Mood disorder (HCC) 07/13/2016  . Foot pain, left 07/13/2016  . Prediabetes 11/07/2015  . Hyperlipidemia 11/07/2015  . Low serum thyroid stimulating hormone (TSH) 09/30/2015  . Osteoarthritis 09/29/2015  . Menopausal syndrome (hot flashes) 09/29/2015  . Obesity, Class I, BMI 30-34.9 09/26/2015  . Vitamin D deficiency 09/26/2015  . Migraine without status migrainosus, not intractable 09/26/2015  . Moderate intermittent asthma without complication 09/26/2015  . Colon polyp 09/26/2015  . Synovial cyst of left knee 09/26/2015  . Allergic rhinitis 09/26/2015    Allergies  Allergen Reactions  . Clavulanic Acid Diarrhea  . Tape Rash    Past Surgical History:  Procedure Laterality Date  . ABDOMINAL HYSTERECTOMY    . APPENDECTOMY    . CESAREAN SECTION     x 1  . COLONOSCOPY  2012   due in 2017 for suspicious lesion  . COLONOSCOPY WITH  PROPOFOL N/A 08/20/2016   Procedure: COLONOSCOPY WITH PROPOFOL;  Surgeon: Midge Miniumarren Wohl, MD;  [facility];  Service: Endoscopy;  Laterality: N/A;  . PARTIAL HYSTERECTOMY     one ovary left  . REPAIR ANKLE LIGAMENT     from fall  . ROTATOR CUFF REPAIR Left    shoulder  . TONSILLECTOMY      Social History   Tobacco Use  . Smoking status: Never Smoker  . Smokeless tobacco: Never Used  Substance Use Topics  . Alcohol use: Yes    Alcohol/week: 0.0 standard drinks    Comment: 2 drinks - 2x/mo  . Drug use: No     Medication list has been reviewed and updated.  Current Meds  Medication Sig  . acetaminophen (TYLENOL) 650 MG CR tablet Take 650 mg by mouth 2 (two) times daily.  Marland Kitchen. albuterol (PROVENTIL HFA;VENTOLIN HFA) 108 (90 BASE) MCG/ACT inhaler Inhale 2 puffs into the lungs every 6 (six) hours as needed for wheezing or shortness of breath.  . citalopram (CELEXA) 20 MG tablet TAKE 1 & 1/2 (ONE & ONE-HALF) TABLETS BY MOUTH ONCE DAILY  . cyanocobalamin 500 MCG tablet Take 500 mcg by mouth daily.  Marland Kitchen. loratadine (CLARITIN) 10 MG tablet Take 10 mg by mouth daily.  . Melatonin 10 MG CAPS Take 10 mg by mouth every other day.  . montelukast (SINGULAIR) 10 MG tablet Take 1 tablet (10 mg total) by mouth at bedtime.  . Multiple Vitamins-Minerals (MULTIVITAMIN ADULTS 50+ PO) Take 1 capsule by mouth daily.  . rizatriptan (MAXALT-MLT) 10 MG disintegrating tablet Take 1 tablet by mouth at headache onset may repeat once  in 2 hours if needed.  No more than 2 tabs in 24 hours  . topiramate (TOPAMAX) 50 MG tablet Take 50 mg by mouth daily.   . Vitamin D, Cholecalciferol, 1000 UNITS CAPS Take 2 capsules by mouth daily.    PHQ 2/9 Scores 01/05/2019 05/13/2017  PHQ - 2 Score 0 3  PHQ- 9 Score - 14   Wt Readings from Last 3 Encounters:  01/05/19 174 lb (78.9 kg)  02/03/18 186 lb (84.4 kg)  12/30/17 199 lb (90.3 kg)    Physical Exam Vitals signs and nursing note reviewed.    Constitutional:      General: She is not in acute distress.    Appearance: She is well-developed.  HENT:     Head: Normocephalic and atraumatic.  Neck:     Musculoskeletal: Normal range of motion and neck supple.  Cardiovascular:     Rate and Rhythm: Normal rate and regular rhythm.  Pulmonary:     Effort: Pulmonary effort is normal. No respiratory distress.     Breath sounds: Normal breath sounds. No wheezing or rhonchi.  Musculoskeletal: Normal range of motion.  Lymphadenopathy:     Cervical: No cervical adenopathy.  Skin:    General: Skin is warm and dry.     Findings: No rash.  Neurological:     Mental Status: She is alert and oriented to person, place, and time.  Psychiatric:        Behavior: Behavior normal.        Thought Content: Thought content normal.     BP 128/70   Pulse (!) 57   Ht 5\' 4"  (1.626 m)   Wt 174 lb (78.9 kg)   SpO2 98%   BMI 29.87 kg/m   Assessment and Plan: 1. Moderate intermittent asthma without complication Will change to nebulizer meds due to cost Continue singulair - budesonide (PULMICORT) 0.5 MG/2ML nebulizer solution; Take 2 mLs (0.5 mg total) by nebulization 2 (two) times daily.  Dispense: 120 mL; Refill: 12 - albuterol (PROVENTIL) (2.5 MG/3ML) 0.083% nebulizer solution; Take 3 mLs (2.5 mg total) by nebulization every 6 (six) hours as needed for wheezing or shortness of breath.  Dispense: 150 mL; Refill: 1  Pt is also due for mammogram but is working in [place] now and needs to wait until her schedule changes to third shift.  She will call when she is ready to schedule.  Partially dictated using Animal nutritionist. Any errors are unintentional.  Bari Edward, MD [facility]  01/05/2019

## 2019-01-21 ENCOUNTER — Encounter: Payer: Self-pay | Admitting: Internal Medicine

## 2019-01-22 ENCOUNTER — Other Ambulatory Visit: Payer: Self-pay

## 2019-01-22 DIAGNOSIS — J4520 Mild intermittent asthma, uncomplicated: Secondary | ICD-10-CM

## 2019-01-22 DIAGNOSIS — IMO0001 Reserved for inherently not codable concepts without codable children: Secondary | ICD-10-CM

## 2019-01-22 MED ORDER — MONTELUKAST SODIUM 10 MG PO TABS: 10.0000 mg | ORAL_TABLET | Freq: Every day | ORAL | 3 refills | Status: DC

## 2019-02-09 ENCOUNTER — Encounter: Payer: 59 | Admitting: Internal Medicine

## 2019-06-15 ENCOUNTER — Encounter: Payer: Self-pay | Admitting: Internal Medicine

## 2019-06-18 NOTE — Telephone Encounter (Signed)
Please advise 

## 2019-06-19 ENCOUNTER — Other Ambulatory Visit: Payer: Self-pay | Admitting: Internal Medicine

## 2019-06-19 DIAGNOSIS — N6310 Unspecified lump in the right breast, unspecified quadrant: Secondary | ICD-10-CM

## 2019-06-19 DIAGNOSIS — N6320 Unspecified lump in the left breast, unspecified quadrant: Secondary | ICD-10-CM

## 2019-06-19 DIAGNOSIS — Z1231 Encounter for screening mammogram for malignant neoplasm of breast: Secondary | ICD-10-CM

## 2019-06-19 NOTE — Telephone Encounter (Signed)
Sent patient separate response in message .

## 2019-06-20 ENCOUNTER — Other Ambulatory Visit: Payer: Self-pay | Admitting: Internal Medicine

## 2019-06-20 DIAGNOSIS — Z1231 Encounter for screening mammogram for malignant neoplasm of breast: Secondary | ICD-10-CM

## 2019-06-27 ENCOUNTER — Ambulatory Visit
Admission: RE | Admit: 2019-06-27 | Discharge: 2019-06-27 | Disposition: A | Discharge status: Home / Self Care | Payer: Managed Care, Other (non HMO) | Source: Ambulatory Visit | Attending: Internal Medicine | Admitting: Internal Medicine

## 2019-06-27 ENCOUNTER — Other Ambulatory Visit: Payer: Self-pay

## 2019-06-27 DIAGNOSIS — Z1231 Encounter for screening mammogram for malignant neoplasm of breast: Secondary | ICD-10-CM

## 2019-06-27 DIAGNOSIS — N6320 Unspecified lump in the left breast, unspecified quadrant: Secondary | ICD-10-CM | POA: Diagnosis present

## 2019-07-26 ENCOUNTER — Encounter: Payer: Self-pay | Admitting: Internal Medicine

## 2019-07-27 ENCOUNTER — Other Ambulatory Visit: Payer: Self-pay

## 2019-07-27 DIAGNOSIS — F39 Unspecified mood [affective] disorder: Secondary | ICD-10-CM

## 2019-07-27 MED ORDER — TOPIRAMATE 50 MG PO TABS: 50.0000 mg | ORAL_TABLET | Freq: Every day | ORAL | 1 refills | Status: DC

## 2019-07-27 MED ORDER — CITALOPRAM HYDROBROMIDE 20 MG PO TABS: ORAL_TABLET | ORAL | 5 refills | Status: DC

## 2019-07-27 MED ORDER — RIZATRIPTAN BENZOATE 10 MG PO TBDP: ORAL_TABLET | ORAL | 5 refills | Status: DC

## 2019-08-07 ENCOUNTER — Ambulatory Visit: Payer: Managed Care, Other (non HMO) | Admitting: Internal Medicine

## 2019-08-08 ENCOUNTER — Other Ambulatory Visit: Payer: Self-pay

## 2019-08-08 ENCOUNTER — Ambulatory Visit
Admission: RE | Admit: 2019-08-08 | Discharge: 2019-08-08 | Disposition: A | Discharge status: Home / Self Care | Payer: BC Managed Care – PPO | Source: Ambulatory Visit | Attending: Internal Medicine | Admitting: Internal Medicine

## 2019-08-08 ENCOUNTER — Encounter: Payer: Self-pay | Admitting: Internal Medicine

## 2019-08-08 ENCOUNTER — Ambulatory Visit
Admission: RE | Admit: 2019-08-08 | Discharge: 2019-08-08 | Disposition: A | Discharge status: Home / Self Care | Payer: BC Managed Care – PPO | Attending: Internal Medicine | Admitting: Internal Medicine

## 2019-08-08 ENCOUNTER — Ambulatory Visit: Payer: BC Managed Care – PPO | Admitting: Internal Medicine

## 2019-08-08 VITALS — BP 140/78 | HR 70 | Ht 64.0 in | Wt 183.0 lb

## 2019-08-08 DIAGNOSIS — M25562 Pain in left knee: Secondary | ICD-10-CM

## 2019-08-08 DIAGNOSIS — M7062 Trochanteric bursitis, left hip: Secondary | ICD-10-CM | POA: Diagnosis not present

## 2019-08-08 DIAGNOSIS — Z23 Encounter for immunization: Secondary | ICD-10-CM | POA: Diagnosis not present

## 2019-08-08 MED ORDER — NABUMETONE 500 MG PO TABS: 500.0000 mg | ORAL_TABLET | Freq: Every day | ORAL | 1 refills | Status: DC

## 2019-08-08 NOTE — Progress Notes (Signed)
Date:  08/08/2019   Name:  [name]   DOB:  03/30/1961   MRN:  [id number]   Chief Complaint: Knee Pain (Lft knee pain. Difficult to walk on it. Started 2 weeks ago. Limping. Left hip pain too. Fell in October and broke elbow. Unsure of how this happened but when she fell she did fall all on her left side. Stiff pain.)  Knee Pain  The pain is present in the right knee. The quality of the pain is described as aching and stabbing. The pain is moderate. The pain has been worsening since onset.  Hip Pain  There was no injury mechanism. The pain is present in the left hip. The quality of the pain is described as aching and burning. The pain is moderate. The pain has been worsening since onset.    Review of Systems  Constitutional: Negative for chills, fatigue and fever.  Respiratory: Negative for shortness of breath.   Cardiovascular: Negative for chest pain, palpitations and leg swelling.  Gastrointestinal: Negative for abdominal pain.  Musculoskeletal: Positive for arthralgias and gait problem. Negative for back pain, joint swelling and myalgias.  Neurological: Negative for dizziness, light-headedness and headaches.    Patient Active Problem List   Diagnosis Date Noted  . Chronic tension-type headache, not intractable 01/17/2017  . Mood disorder ([place]) 07/13/2016  . Foot pain, left 07/13/2016  . Prediabetes 11/07/2015  . Hyperlipidemia 11/07/2015  . Low serum thyroid stimulating hormone (TSH) 09/30/2015  . Osteoarthritis 09/29/2015  . Menopausal syndrome (hot flashes) 09/29/2015  . Obesity, Class I, BMI 30-34.9 09/26/2015  . Vitamin D deficiency 09/26/2015  . Migraine without status migrainosus, not intractable 09/26/2015  . Moderate intermittent asthma without complication 51/12/5850  . Colon polyp 09/26/2015  . Synovial cyst of left knee 09/26/2015  . Allergic rhinitis 09/26/2015    Allergies  Allergen Reactions  . Clavulanic Acid Diarrhea  . Tape Rash    Past  Surgical History:  Procedure Laterality Date  . ABDOMINAL HYSTERECTOMY    . APPENDECTOMY    . CESAREAN SECTION     x 1  . COLONOSCOPY  2012   due in 2017 for suspicious lesion  . COLONOSCOPY WITH PROPOFOL N/A 08/20/2016   Procedure: COLONOSCOPY WITH PROPOFOL;  Surgeon: Lucilla Lame, MD;  Location: [place];  Service: Endoscopy;  Laterality: N/A;  . PARTIAL HYSTERECTOMY     one ovary left  . REPAIR ANKLE LIGAMENT     from fall  . ROTATOR CUFF REPAIR Left    shoulder  . TONSILLECTOMY      Social History   Tobacco Use  . Smoking status: Never Smoker  . Smokeless tobacco: Never Used  Substance Use Topics  . Alcohol use: Yes    Alcohol/week: 0.0 standard drinks    Comment: 2 drinks - 2x/mo  . Drug use: No     Medication list has been reviewed and updated.  Current Meds  Medication Sig  . acetaminophen (TYLENOL) 650 MG CR tablet Take 650 mg by mouth 2 (two) times daily.  Marland Kitchen albuterol (PROVENTIL HFA;VENTOLIN HFA) 108 (90 BASE) MCG/ACT inhaler Inhale 2 puffs into the lungs every 6 (six) hours as needed for wheezing or shortness of breath.  Marland Kitchen albuterol (PROVENTIL) (2.5 MG/3ML) 0.083% nebulizer solution Take 3 mLs (2.5 mg total) by nebulization every 6 (six) hours as needed for wheezing or shortness of breath.  . citalopram (CELEXA) 20 MG tablet TAKE 1 & 1/2 (ONE & ONE-HALF) TABLETS BY MOUTH ONCE  DAILY (Patient taking differently: Take 20 mg by mouth daily. TAKE 1 & 1/2 (ONE & ONE-HALF) TABLETS BY MOUTH ONCE DAILY)  . cyanocobalamin 500 MCG tablet Take 500 mcg by mouth daily.  Marland Kitchen. loratadine (CLARITIN) 10 MG tablet Take 10 mg by mouth daily.  . Melatonin 10 MG CAPS Take 10 mg by mouth every other day.  . montelukast (SINGULAIR) 10 MG tablet Take 1 tablet (10 mg total) by mouth at bedtime.  . Multiple Vitamins-Minerals (MULTIVITAMIN ADULTS 50+ PO) Take 1 capsule by mouth daily.  . rizatriptan (MAXALT-MLT) 10 MG disintegrating tablet Take 1 tablet by mouth at headache onset may  repeat once in 2 hours if needed.  No more than 2 tabs in 24 hours  . topiramate (TOPAMAX) 50 MG tablet Take 1 tablet (50 mg total) by mouth daily.  . Vitamin D, Cholecalciferol, 1000 UNITS CAPS Take 2 capsules by mouth daily.    PHQ 2/9 Scores 08/08/2019 01/05/2019 05/13/2017  PHQ - 2 Score 1 0 3  PHQ- 9 Score - - 14    BP Readings from Last 3 Encounters:  08/08/19 140/78  01/05/19 128/70  02/03/18 (!) 142/80    Physical Exam Vitals signs and nursing note reviewed.  Constitutional:      General: She is not in acute distress.    Appearance: Normal appearance. She is well-developed.  HENT:     Head: Normocephalic and atraumatic.  Neck:     Musculoskeletal: Normal range of motion.  Cardiovascular:     Rate and Rhythm: Normal rate and regular rhythm.     Pulses: Normal pulses.  Pulmonary:     Effort: Pulmonary effort is normal. No respiratory distress.     Breath sounds: No wheezing or rhonchi.  Musculoskeletal:     Left hip: She exhibits tenderness (lateral trochanter).     Left knee: She exhibits decreased range of motion. She exhibits no swelling, no effusion and no ecchymosis. Tenderness found. Lateral joint line tenderness noted.     Comments: SLR is negative bilaterally  Lymphadenopathy:     Cervical: No cervical adenopathy.  Skin:    General: Skin is warm and dry.     Findings: No rash.  Neurological:     Mental Status: She is alert and oriented to person, place, and time.  Psychiatric:        Behavior: Behavior normal.        Thought Content: Thought content normal.     Wt Readings from Last 3 Encounters:  08/08/19 183 lb (83 kg)  01/05/19 174 lb (78.9 kg)  02/03/18 186 lb (84.4 kg)    BP 140/78   Pulse 70   Ht 5\' 4"  (1.626 m)   Wt 183 lb (83 kg)   SpO2 96%   BMI 31.41 kg/m   Assessment and Plan: 1. Acute pain of left knee Stop Advil and begin Relafen bid - may also augment with 2 tylenol Will xrays to further evaluate - nabumetone (RELAFEN) 500 MG  tablet; Take 1 tablet (500 mg total) by mouth daily.  Dispense: 60 tablet; Refill: 1 - DG Knee Complete 4 Views Left; Future  2. Trochanteric bursitis of left hip Pt declines steroid taper due to intolerance in the past Recommend heat or ice and Relafen as above  3. Need for diphtheria-tetanus-pertussis (Tdap) vaccine - Tdap vaccine greater than or equal to 7yo IM   Partially dictated using Animal nutritionistDragon software. Any errors are unintentional.  Bari EdwardLaura Ailany Koren, MD [facility]  08/08/2019

## 2019-08-15 ENCOUNTER — Encounter: Payer: Managed Care, Other (non HMO) | Admitting: Internal Medicine

## 2019-08-18 ENCOUNTER — Other Ambulatory Visit: Payer: Self-pay | Admitting: Internal Medicine

## 2019-08-18 DIAGNOSIS — F39 Unspecified mood [affective] disorder: Secondary | ICD-10-CM

## 2019-09-05 ENCOUNTER — Encounter: Payer: Self-pay | Admitting: Internal Medicine

## 2019-09-05 NOTE — Telephone Encounter (Signed)
Please advise 

## 2019-09-17 ENCOUNTER — Ambulatory Visit (INDEPENDENT_AMBULATORY_CARE_PROVIDER_SITE_OTHER): Payer: BC Managed Care – PPO | Admitting: Internal Medicine

## 2019-09-17 ENCOUNTER — Other Ambulatory Visit: Payer: Self-pay

## 2019-09-17 ENCOUNTER — Encounter: Payer: Self-pay | Admitting: Internal Medicine

## 2019-09-17 VITALS — BP 120/70 | HR 73 | Ht 64.0 in | Wt 181.0 lb

## 2019-09-17 DIAGNOSIS — F39 Unspecified mood [affective] disorder: Secondary | ICD-10-CM | POA: Diagnosis not present

## 2019-09-17 DIAGNOSIS — Z0000 Encounter for general adult medical examination without abnormal findings: Secondary | ICD-10-CM | POA: Diagnosis not present

## 2019-09-17 DIAGNOSIS — G43909 Migraine, unspecified, not intractable, without status migrainosus: Secondary | ICD-10-CM | POA: Diagnosis not present

## 2019-09-17 DIAGNOSIS — R7303 Prediabetes: Secondary | ICD-10-CM | POA: Diagnosis not present

## 2019-09-17 DIAGNOSIS — J4520 Mild intermittent asthma, uncomplicated: Secondary | ICD-10-CM

## 2019-09-17 DIAGNOSIS — Z1231 Encounter for screening mammogram for malignant neoplasm of breast: Secondary | ICD-10-CM

## 2019-09-17 DIAGNOSIS — IMO0001 Reserved for inherently not codable concepts without codable children: Secondary | ICD-10-CM

## 2019-09-17 DIAGNOSIS — E785 Hyperlipidemia, unspecified: Secondary | ICD-10-CM | POA: Diagnosis not present

## 2019-09-17 LAB — POCT URINALYSIS DIPSTICK
Bilirubin, UA: NEGATIVE
Blood, UA: NEGATIVE
Glucose, UA: NEGATIVE
Ketones, UA: NEGATIVE
Leukocytes, UA: NEGATIVE
Nitrite, UA: NEGATIVE
Protein, UA: NEGATIVE
Spec Grav, UA: 1.01 (ref 1.010–1.025)
Urobilinogen, UA: 0.2 E.U./dL
pH, UA: 6 (ref 5.0–8.0)

## 2019-09-17 MED ORDER — MONTELUKAST SODIUM 10 MG PO TABS: 10.0000 mg | ORAL_TABLET | Freq: Every day | ORAL | 3 refills | Status: DC

## 2019-09-17 NOTE — Progress Notes (Signed)
Date:  09/17/2019   Name:  [name]   DOB:  [birth year]   MRN:  [id number]   Chief Complaint: Annual Exam (Breast Exam.) [name] is a 58 y.o. female who presents today for her Complete Annual Exam. She feels fairly well. She reports exercising regularly. She reports she is sleeping fairly well.   Mammogram  05/2019 Pap smear - discontinued Colonoscopy  07/2016  Asthma There is no cough, shortness of breath or wheezing. This is a recurrent problem. Pertinent negatives include no chest pain, fever, headaches or trouble swallowing. Her symptoms are alleviated by beta-agonist and leukotriene antagonist. Her past medical history is significant for asthma.  Depression        This is a chronic problem.The problem is unchanged.  Associated symptoms include no fatigue and no headaches.     The symptoms are aggravated by social issues and work stress.  Past treatments include SSRIs - Selective serotonin reuptake inhibitors (cut celexa back to 20 mg).  Compliance with treatment is good. Migraine  This is a recurrent problem. The pain quality is similar to prior headaches. Pertinent negatives include no abdominal pain, coughing, dizziness, fever, hearing loss, tinnitus or vomiting. She has tried triptans (Topamax) for the symptoms. The treatment provided significant relief.    Review of Systems  Constitutional: Negative for chills, fatigue and fever.  HENT: Negative for congestion, hearing loss, tinnitus, trouble swallowing and voice change.   Eyes: Negative for visual disturbance.  Respiratory: Negative for cough, chest tightness, shortness of breath and wheezing.   Cardiovascular: Negative for chest pain, palpitations and leg swelling.  Gastrointestinal: Negative for abdominal pain, constipation, diarrhea and vomiting.  Endocrine: Negative for polydipsia and polyuria.  Genitourinary: Negative for dysuria, frequency, genital sores, vaginal bleeding and vaginal discharge.   Musculoskeletal: Negative for arthralgias, gait problem and joint swelling.  Skin: Negative for color change and rash.  Neurological: Negative for dizziness, tremors, light-headedness and headaches.  Hematological: Negative for adenopathy. Does not bruise/bleed easily.  Psychiatric/Behavioral: Positive for depression. Negative for dysphoric mood and sleep disturbance. The patient is not nervous/anxious.     Patient Active Problem List   Diagnosis Date Noted  . Chronic tension-type headache, not intractable 01/17/2017  . Mood disorder ([place]) 07/13/2016  . Foot pain, left 07/13/2016  . Prediabetes 11/07/2015  . Hyperlipidemia 11/07/2015  . Low serum thyroid stimulating hormone (TSH) 09/30/2015  . Osteoarthritis 09/29/2015  . Menopausal syndrome (hot flashes) 09/29/2015  . Obesity, Class I, BMI 30-34.9 09/26/2015  . Vitamin D deficiency 09/26/2015  . Migraine without status migrainosus, not intractable 09/26/2015  . Moderate intermittent asthma without complication 61/95/0932  . Colon polyp 09/26/2015  . Synovial cyst of left knee 09/26/2015  . Allergic rhinitis 09/26/2015    Allergies  Allergen Reactions  . Clavulanic Acid Diarrhea  . Oxycodone Itching  . Tape Rash    Past Surgical History:  Procedure Laterality Date  . ABDOMINAL HYSTERECTOMY    . APPENDECTOMY    . CESAREAN SECTION     x 1  . COLONOSCOPY  2012   due in 2017 for suspicious lesion  . COLONOSCOPY WITH PROPOFOL N/A 08/20/2016   Procedure: COLONOSCOPY WITH PROPOFOL;  Surgeon: Lucilla Lame, MD;  Location: [facility];  Service: Endoscopy;  Laterality: N/A;  . PARTIAL HYSTERECTOMY     one ovary left  . REPAIR ANKLE LIGAMENT     from fall  . ROTATOR CUFF REPAIR Left    shoulder  . TONSILLECTOMY  Social History   Tobacco Use  . Smoking status: Never Smoker  . Smokeless tobacco: Never Used  Substance Use Topics  . Alcohol use: Yes    Alcohol/week: 0.0 standard drinks    Comment: 2 drinks -  2x/mo  . Drug use: No     Medication list has been reviewed and updated.  Current Meds  Medication Sig  . acetaminophen (TYLENOL) 650 MG CR tablet Take 650 mg by mouth 2 (two) times daily.  Marland Kitchen. albuterol (PROVENTIL HFA;VENTOLIN HFA) 108 (90 BASE) MCG/ACT inhaler Inhale 2 puffs into the lungs every 6 (six) hours as needed for wheezing or shortness of breath.  Marland Kitchen. albuterol (PROVENTIL) (2.5 MG/3ML) 0.083% nebulizer solution Take 3 mLs (2.5 mg total) by nebulization every 6 (six) hours as needed for wheezing or shortness of breath.  Marland Kitchen. amoxicillin (AMOXIL) 500 MG capsule   . citalopram (CELEXA) 20 MG tablet TAKE 1 & 1/2 (ONE & ONE-HALF) TABLETS BY MOUTH ONCE DAILY (Patient taking differently: Take 20 mg by mouth daily. )  . cyanocobalamin 500 MCG tablet Take 500 mcg by mouth daily.  Marland Kitchen. loratadine (CLARITIN) 10 MG tablet Take 10 mg by mouth daily.  . Melatonin 10 MG CAPS Take 5 mg by mouth every other day.   . Multiple Vitamins-Minerals (MULTIVITAMIN ADULTS 50+ PO) Take 1 capsule by mouth daily.  . rizatriptan (MAXALT-MLT) 10 MG disintegrating tablet Take 1 tablet by mouth at headache onset may repeat once in 2 hours if needed.  No more than 2 tabs in 24 hours  . topiramate (TOPAMAX) 50 MG tablet Take 1 tablet (50 mg total) by mouth daily.  . Vitamin D, Cholecalciferol, 1000 UNITS CAPS Take 1 capsule by mouth daily.     PHQ 2/9 Scores 09/17/2019 08/08/2019 01/05/2019 05/13/2017  PHQ - 2 Score 0 1 0 3  PHQ- 9 Score 0 - - 14    BP Readings from Last 3 Encounters:  09/17/19 120/70  08/08/19 140/78  01/05/19 128/70    Physical Exam Vitals signs and nursing note reviewed.  Constitutional:      General: She is not in acute distress.    Appearance: She is well-developed.  HENT:     Head: Normocephalic and atraumatic.     Right Ear: Tympanic membrane and ear canal normal.     Left Ear: Tympanic membrane and ear canal normal.     Nose:     Right Sinus: No maxillary sinus tenderness.     Left  Sinus: No maxillary sinus tenderness.  Eyes:     General: No scleral icterus.       Right eye: No discharge.        Left eye: No discharge.     Conjunctiva/sclera: Conjunctivae normal.  Neck:     Musculoskeletal: Normal range of motion. No erythema.     Thyroid: No thyromegaly.     Vascular: No carotid bruit.  Cardiovascular:     Rate and Rhythm: Normal rate and regular rhythm.     Pulses: Normal pulses.     Heart sounds: Normal heart sounds.  Pulmonary:     Effort: Pulmonary effort is normal. No respiratory distress.     Breath sounds: No wheezing.  Abdominal:     General: Bowel sounds are normal.     Palpations: Abdomen is soft.     Tenderness: There is no abdominal tenderness.  Musculoskeletal: Normal range of motion.  Lymphadenopathy:     Cervical: No cervical adenopathy.  Skin:  General: Skin is warm and dry.     Findings: No rash.  Neurological:     Mental Status: She is alert and oriented to person, place, and time.     Cranial Nerves: No cranial nerve deficit.     Sensory: No sensory deficit.     Deep Tendon Reflexes: Reflexes are normal and symmetric.  Psychiatric:        Speech: Speech normal.        Behavior: Behavior normal.        Thought Content: Thought content normal.     Wt Readings from Last 3 Encounters:  09/17/19 181 lb (82.1 kg)  08/08/19 183 lb (83 kg)  01/05/19 174 lb (78.9 kg)    BP 120/70   Pulse 73   Ht 5\' 4"  (1.626 m)   Wt 181 lb (82.1 kg)   SpO2 98%   BMI 31.07 kg/m   Assessment and Plan: 1. Annual physical exam Normal exam except for weight - continue diet and exercise - CBC with Differential/Platelet - Comprehensive metabolic panel - Lipid panel - TSH - POCT urinalysis dipstick - Hemoglobin A1c  2. Encounter for screening mammogram for breast cancer Recently normal Breast exam deferred  3. Mood disorder (HCC) Clinically stable on Celexa.  No SI or HI. Will continue current dose.  4. Migraine without status  migrainosus, not intractable, unspecified migraine type Intermittent migraine continues to be responsive to triptans as needed Frequency decreased on Topamax prevention  5. Moderate intermittent asthma without complication Moderate asthma without recently exacerbations Pt is not on ICS due to cost at this time - will continue singulair - montelukast (SINGULAIR) 10 MG tablet; Take 1 tablet (10 mg total) by mouth at bedtime.  Dispense: 90 tablet; Refill: 3   Partially dictated using . Any errors are unintentional.  Animal nutritionist, MD [facility]  09/17/2019

## 2019-09-18 LAB — COMPREHENSIVE METABOLIC PANEL
ALT: 16 IU/L (ref 0–32)
AST: 19 IU/L (ref 0–40)
Albumin/Globulin Ratio: 1.9 (ref 1.2–2.2)
Albumin: 4.3 g/dL (ref 3.8–4.9)
Alkaline Phosphatase: 67 IU/L (ref 39–117)
BUN/Creatinine Ratio: 15 (ref 9–23)
BUN: 14 mg/dL (ref 6–24)
Bilirubin Total: 0.2 mg/dL (ref 0.0–1.2)
CO2: 25 mmol/L (ref 20–29)
Calcium: 8.9 mg/dL (ref 8.7–10.2)
Chloride: 105 mmol/L (ref 96–106)
Creatinine, Ser: 0.93 mg/dL (ref 0.57–1.00)
GFR calc Af Amer: 78 mL/min/{1.73_m2} (ref >59)
GFR calc non Af Amer: 67 mL/min/{1.73_m2} (ref >59)
Globulin, Total: 2.3 g/dL (ref 1.5–4.5)
Glucose: 99 mg/dL (ref 65–99)
Potassium: 3.9 mmol/L (ref 3.5–5.2)
Sodium: 143 mmol/L (ref 134–144)
Total Protein: 6.6 g/dL (ref 6.0–8.5)

## 2019-09-18 LAB — CBC WITH DIFFERENTIAL/PLATELET
Basophils Absolute: 0 10*3/uL (ref 0.0–0.2)
Basos: 1 %
EOS (ABSOLUTE): 0.1 10*3/uL (ref 0.0–0.4)
Eos: 2 %
Hematocrit: 41.2 % (ref 34.0–46.6)
Hemoglobin: 13.6 g/dL (ref 11.1–15.9)
Immature Grans (Abs): 0 10*3/uL (ref 0.0–0.1)
Immature Granulocytes: 0 %
Lymphocytes Absolute: 2.6 10*3/uL (ref 0.7–3.1)
Lymphs: 41 %
MCH: 29.4 pg (ref 26.6–33.0)
MCHC: 33 g/dL (ref 31.5–35.7)
MCV: 89 fL (ref 79–97)
Monocytes Absolute: 0.4 10*3/uL (ref 0.1–0.9)
Monocytes: 6 %
Neutrophils Absolute: 3.2 10*3/uL (ref 1.4–7.0)
Neutrophils: 50 %
Platelets: 318 10*3/uL (ref 150–450)
RBC: 4.63 x10E6/uL (ref 3.77–5.28)
RDW: 13.9 % (ref 11.7–15.4)
WBC: 6.3 10*3/uL (ref 3.4–10.8)

## 2019-09-18 LAB — LIPID PANEL
Chol/HDL Ratio: 4 ratio (ref 0.0–4.4)
Cholesterol, Total: 227 mg/dL — ABNORMAL HIGH (ref 100–199)
HDL: 57 mg/dL (ref >39)
LDL Chol Calc (NIH): 137 mg/dL — ABNORMAL HIGH (ref 0–99)
Triglycerides: 187 mg/dL — ABNORMAL HIGH (ref 0–149)
VLDL Cholesterol Cal: 33 mg/dL (ref 5–40)

## 2019-09-18 LAB — HEMOGLOBIN A1C
Est. average glucose Bld gHb Est-mCnc: 114 mg/dL
Hgb A1c MFr Bld: 5.6 % (ref 4.8–5.6)

## 2019-09-18 LAB — TSH: TSH: 2.15 u[IU]/mL (ref 0.450–4.500)

## 2019-11-29 DIAGNOSIS — Z20828 Contact with and (suspected) exposure to other viral communicable diseases: Secondary | ICD-10-CM | POA: Diagnosis not present

## 2019-12-06 ENCOUNTER — Encounter: Payer: Self-pay | Admitting: Internal Medicine

## 2019-12-07 ENCOUNTER — Other Ambulatory Visit: Payer: Self-pay | Admitting: Internal Medicine

## 2019-12-07 ENCOUNTER — Encounter: Payer: Self-pay | Admitting: Internal Medicine

## 2019-12-07 DIAGNOSIS — G43909 Migraine, unspecified, not intractable, without status migrainosus: Secondary | ICD-10-CM

## 2019-12-07 MED ORDER — TOPIRAMATE 50 MG PO TABS: 50.0000 mg | ORAL_TABLET | Freq: Every day | ORAL | 1 refills | Status: DC

## 2019-12-07 MED ORDER — RIZATRIPTAN BENZOATE 10 MG PO TBDP: ORAL_TABLET | ORAL | 5 refills | Status: DC

## 2019-12-15 ENCOUNTER — Other Ambulatory Visit: Payer: Self-pay | Admitting: Internal Medicine

## 2020-01-13 ENCOUNTER — Other Ambulatory Visit: Payer: Self-pay | Admitting: Internal Medicine

## 2020-01-13 ENCOUNTER — Encounter: Payer: Self-pay | Admitting: Internal Medicine

## 2020-01-13 DIAGNOSIS — G43909 Migraine, unspecified, not intractable, without status migrainosus: Secondary | ICD-10-CM

## 2020-01-13 MED ORDER — TOPIRAMATE 25 MG PO TABS: 25.0000 mg | ORAL_TABLET | Freq: Every day | ORAL | 3 refills | Status: DC

## 2020-02-08 ENCOUNTER — Encounter: Payer: Self-pay | Admitting: Internal Medicine

## 2020-02-08 ENCOUNTER — Encounter (INDEPENDENT_AMBULATORY_CARE_PROVIDER_SITE_OTHER): Payer: Self-pay

## 2020-03-04 ENCOUNTER — Encounter: Payer: Self-pay | Admitting: Internal Medicine

## 2020-03-04 ENCOUNTER — Ambulatory Visit: Payer: BC Managed Care – PPO | Admitting: Internal Medicine

## 2020-03-04 ENCOUNTER — Other Ambulatory Visit: Payer: Self-pay

## 2020-03-04 VITALS — BP 132/78 | HR 67 | Temp 98.3°F | Ht 64.0 in | Wt 182.0 lb

## 2020-03-04 DIAGNOSIS — J4520 Mild intermittent asthma, uncomplicated: Secondary | ICD-10-CM | POA: Diagnosis not present

## 2020-03-04 DIAGNOSIS — J3089 Other allergic rhinitis: Secondary | ICD-10-CM | POA: Diagnosis not present

## 2020-03-04 DIAGNOSIS — IMO0001 Reserved for inherently not codable concepts without codable children: Secondary | ICD-10-CM

## 2020-03-04 DIAGNOSIS — F39 Unspecified mood [affective] disorder: Secondary | ICD-10-CM | POA: Diagnosis not present

## 2020-03-04 MED ORDER — MONTELUKAST SODIUM 10 MG PO TABS: 10.0000 mg | ORAL_TABLET | Freq: Every day | ORAL | 3 refills | Status: DC

## 2020-03-04 NOTE — Progress Notes (Signed)
Date:  03/04/2020   Name:  [name]   DOB:  May 01, 1961   MRN:  [id number]   Chief Complaint: Asthma  Asthma She complains of wheezing. There is no cough or shortness of breath. This is a recurrent problem. The problem occurs rarely (uses albuterol neb 1-2 times per month). The problem has been unchanged. Pertinent negatives include no chest pain, fever or headaches. Her symptoms are aggravated by change in weather. Her symptoms are alleviated by beta-agonist and leukotriene antagonist. She reports complete improvement on treatment. Her past medical history is significant for asthma. Past medical history comments: And allergies.   Immunization History  Administered Date(s) Administered  . Influenza,inj,Quad PF,6+ Mos 07/22/2017, 08/08/2019  . Influenza-Unspecified 09/03/2015, 09/05/2016, 08/29/2018  . PFIZER SARS-COV-2 Vaccination 02/01/2020, 02/27/2020  . Tdap 08/08/2019    Lab Results  Component Value Date   CREATININE 0.93 09/17/2019   BUN 14 09/17/2019   NA 143 09/17/2019   K 3.9 09/17/2019   CL 105 09/17/2019   CO2 25 09/17/2019   Lab Results  Component Value Date   CHOL 227 (H) 09/17/2019   HDL 57 09/17/2019   LDLCALC 137 (H) 09/17/2019   TRIG 187 (H) 09/17/2019   CHOLHDL 4.0 09/17/2019   Lab Results  Component Value Date   TSH 2.150 09/17/2019   Lab Results  Component Value Date   HGBA1C 5.6 09/17/2019   Lab Results  Component Value Date   WBC 6.3 09/17/2019   HGB 13.6 09/17/2019   HCT 41.2 09/17/2019   MCV 89 09/17/2019   PLT 318 09/17/2019   Lab Results  Component Value Date   ALT 16 09/17/2019   AST 19 09/17/2019   ALKPHOS 67 09/17/2019   BILITOT 0.2 09/17/2019     Review of Systems  Constitutional: Negative for chills, fatigue and fever.  Respiratory: Positive for wheezing. Negative for cough, chest tightness and shortness of breath.   Cardiovascular: Negative for chest pain, palpitations and leg swelling.  Neurological: Negative for  dizziness and headaches.  Psychiatric/Behavioral: Negative for sleep disturbance. The patient is nervous/anxious.     Patient Active Problem List   Diagnosis Date Noted  . Chronic tension-type headache, not intractable 01/17/2017  . Mood disorder (HCC) 07/13/2016  . Foot pain, left 07/13/2016  . Prediabetes 11/07/2015  . Hyperlipidemia 11/07/2015  . Low serum thyroid stimulating hormone (TSH) 09/30/2015  . Osteoarthritis 09/29/2015  . Menopausal syndrome (hot flashes) 09/29/2015  . Obesity, Class I, BMI 30-34.9 09/26/2015  . Vitamin D deficiency 09/26/2015  . Migraine without status migrainosus, not intractable 09/26/2015  . Moderate intermittent asthma without complication 09/26/2015  . Colon polyp 09/26/2015  . Synovial cyst of left knee 09/26/2015  . Environmental and seasonal allergies 09/26/2015    Allergies  Allergen Reactions  . Clavulanic Acid Diarrhea  . Oxycodone Itching  . Tape Rash    Past Surgical History:  Procedure Laterality Date  . ABDOMINAL HYSTERECTOMY    . APPENDECTOMY    . CESAREAN SECTION     x 1  . COLONOSCOPY  2012   due in 2017 for suspicious lesion  . COLONOSCOPY WITH PROPOFOL N/A 08/20/2016   Procedure: COLONOSCOPY WITH PROPOFOL;  Surgeon: Midge Minium, MD;  Location: [facility];  Service: Endoscopy;  Laterality: N/A;  . PARTIAL HYSTERECTOMY     one ovary left  . REPAIR ANKLE LIGAMENT     from fall  . ROTATOR CUFF REPAIR Left    shoulder  . TONSILLECTOMY  Social History   Tobacco Use  . Smoking status: Never Smoker  . Smokeless tobacco: Never Used  Substance Use Topics  . Alcohol use: Yes    Alcohol/week: 0.0 standard drinks    Comment: 2 drinks - 2x/mo  . Drug use: No     Medication list has been reviewed and updated.  Current Meds  Medication Sig  . acetaminophen (TYLENOL) 650 MG CR tablet Take 650 mg by mouth 2 (two) times daily.  Marland Kitchen albuterol (PROVENTIL HFA;VENTOLIN HFA) 108 (90 BASE) MCG/ACT inhaler Inhale 2  puffs into the lungs every 6 (six) hours as needed for wheezing or shortness of breath.  Marland Kitchen albuterol (PROVENTIL) (2.5 MG/3ML) 0.083% nebulizer solution Take 3 mLs (2.5 mg total) by nebulization every 6 (six) hours as needed for wheezing or shortness of breath.  . citalopram (CELEXA) 20 MG tablet TAKE 1 & 1/2 (ONE & ONE-HALF) TABLETS BY MOUTH ONCE DAILY (Patient taking differently: Take 20 mg by mouth daily. )  . cyanocobalamin 500 MCG tablet Take 500 mcg by mouth daily.  Marland Kitchen loratadine (CLARITIN) 10 MG tablet Take 10 mg by mouth daily.  . Melatonin 10 MG CAPS Take 5 mg by mouth every other day.   . montelukast (SINGULAIR) 10 MG tablet Take 1 tablet (10 mg total) by mouth at bedtime.  . Multiple Vitamins-Minerals (MULTIVITAMIN ADULTS 50+ PO) Take 1 capsule by mouth daily.  . nabumetone (RELAFEN) 500 MG tablet TAKE 1 TABLET BY MOUTH EVERY DAY  . rizatriptan (MAXALT-MLT) 10 MG disintegrating tablet Take 1 tablet by mouth at headache onset may repeat once in 2 hours if needed.  No more than 2 tabs in 24 hours  . topiramate (TOPAMAX) 25 MG tablet Take 1 tablet (25 mg total) by mouth daily.  . Vitamin D, Cholecalciferol, 1000 UNITS CAPS Take 1 capsule by mouth daily.     PHQ 2/9 Scores 03/04/2020 09/17/2019 08/08/2019 01/05/2019  PHQ - 2 Score 1 0 1 0  PHQ- 9 Score 1 0 - -    BP Readings from Last 3 Encounters:  03/04/20 132/78  09/17/19 120/70  08/08/19 140/78    Physical Exam Vitals and nursing note reviewed.  Constitutional:      General: She is not in acute distress.    Appearance: Normal appearance. She is well-developed.  HENT:     Head: Normocephalic and atraumatic.  Cardiovascular:     Rate and Rhythm: Normal rate and regular rhythm.     Heart sounds: No murmur.  Pulmonary:     Effort: Pulmonary effort is normal. No respiratory distress.     Breath sounds: No wheezing or rhonchi.  Musculoskeletal:     Cervical back: Normal range of motion.     Right lower leg: No edema.     Left  lower leg: No edema.  Lymphadenopathy:     Cervical: No cervical adenopathy.  Skin:    General: Skin is warm and dry.     Findings: No rash.  Neurological:     Mental Status: She is alert and oriented to person, place, and time.  Psychiatric:        Attention and Perception: Attention normal.        Mood and Affect: Mood normal.     Wt Readings from Last 3 Encounters:  03/04/20 182 lb (82.6 kg)  09/17/19 181 lb (82.1 kg)  08/08/19 183 lb (83 kg)    BP 132/78   Pulse 67   Temp 98.3 F (36.8 C) (Oral)  Ht 5\' 4"  (1.626 m)   Wt 182 lb (82.6 kg)   SpO2 96%   BMI 31.24 kg/m   Assessment and Plan: 1. Moderate intermittent asthma without complication Doing well with good control, using albuterol neb only rarely Continue singulair - montelukast (SINGULAIR) 10 MG tablet; Take 1 tablet (10 mg total) by mouth at bedtime.  Dispense: 90 tablet; Refill: 3  2. Environmental and seasonal allergies Continue nasacort and zyrtec  3. Mood disorder ([place]) Clinically stable on current regimen with good control of symptoms. Will continue current therapy.    Partially dictated using Editor, commissioning. Any errors are unintentional.  Halina Maidens, MD [facility]  03/04/2020

## 2020-03-12 ENCOUNTER — Ambulatory Visit: Payer: BC Managed Care – PPO | Admitting: Internal Medicine

## 2020-04-14 DIAGNOSIS — H43811 Vitreous degeneration, right eye: Secondary | ICD-10-CM | POA: Diagnosis not present

## 2020-05-14 ENCOUNTER — Other Ambulatory Visit: Payer: Self-pay

## 2020-05-14 DIAGNOSIS — F39 Unspecified mood [affective] disorder: Secondary | ICD-10-CM

## 2020-05-14 MED ORDER — CITALOPRAM HYDROBROMIDE 20 MG PO TABS: 20.0000 mg | ORAL_TABLET | Freq: Every day | ORAL | 1 refills | Status: DC

## 2020-05-27 ENCOUNTER — Encounter: Payer: Self-pay | Admitting: Internal Medicine

## 2020-05-28 ENCOUNTER — Other Ambulatory Visit: Payer: Self-pay | Admitting: Internal Medicine

## 2020-05-28 DIAGNOSIS — Z1231 Encounter for screening mammogram for malignant neoplasm of breast: Secondary | ICD-10-CM

## 2020-06-18 DIAGNOSIS — H43811 Vitreous degeneration, right eye: Secondary | ICD-10-CM | POA: Diagnosis not present

## 2020-06-27 ENCOUNTER — Ambulatory Visit
Admission: RE | Admit: 2020-06-27 | Discharge: 2020-06-27 | Disposition: A | Discharge status: Home / Self Care | Payer: BC Managed Care – PPO | Source: Ambulatory Visit | Attending: Internal Medicine | Admitting: Internal Medicine

## 2020-06-27 DIAGNOSIS — Z1231 Encounter for screening mammogram for malignant neoplasm of breast: Secondary | ICD-10-CM | POA: Diagnosis not present

## 2020-07-11 ENCOUNTER — Encounter: Payer: Self-pay | Admitting: Internal Medicine

## 2020-07-16 DIAGNOSIS — Z111 Encounter for screening for respiratory tuberculosis: Secondary | ICD-10-CM | POA: Diagnosis not present

## 2020-07-16 DIAGNOSIS — Z23 Encounter for immunization: Secondary | ICD-10-CM | POA: Diagnosis not present

## 2020-07-18 DIAGNOSIS — Z111 Encounter for screening for respiratory tuberculosis: Secondary | ICD-10-CM | POA: Diagnosis not present

## 2020-07-18 DIAGNOSIS — Z0184 Encounter for antibody response examination: Secondary | ICD-10-CM | POA: Diagnosis not present

## 2020-07-29 ENCOUNTER — Other Ambulatory Visit: Payer: Self-pay

## 2020-07-29 ENCOUNTER — Ambulatory Visit: Payer: BC Managed Care – PPO | Admitting: Internal Medicine

## 2020-07-29 ENCOUNTER — Encounter: Payer: Self-pay | Admitting: Internal Medicine

## 2020-07-29 VITALS — BP 136/66 | HR 85 | Ht 64.0 in | Wt 181.0 lb

## 2020-07-29 DIAGNOSIS — M62830 Muscle spasm of back: Secondary | ICD-10-CM | POA: Diagnosis not present

## 2020-07-29 NOTE — Patient Instructions (Signed)
Take Advil 400 mg three times a day  Heat as needed  Continue Lidoderm patches

## 2020-07-29 NOTE — Progress Notes (Signed)
Date:  07/29/2020   Name:  [name]   DOB:  [birth year]   MRN:  [id number]   Chief Complaint: Back Pain (Right sided lower back pain. Started Friday night. Sitting in the middle of dinner and her pain became severe. Tried advil and woke up in tears from the pain. Was given tizanidine to use TID from a online teledoc. )  Back Pain This is a new problem. The current episode started in the past 7 days. The problem has been gradually improving since onset. The pain is present in the lumbar spine and thoracic spine (right side of back). The pain does not radiate. The pain is moderate. The symptoms are aggravated by sitting. Pertinent negatives include no bladder incontinence, bowel incontinence, dysuria, fever, headaches, leg pain, pelvic pain, perianal numbness or weakness. She has tried muscle relaxant (and lidoderm patches) for the symptoms. The treatment provided moderate (60% improved) relief.  The tele-doc was concerned about possible Paget's disease since she has had 2 fractures and healed so quickly.  Also felt that she needed a calcium and vitamin D supplement which she is starting today.  Lab Results  Component Value Date   CREATININE 0.93 09/17/2019   BUN 14 09/17/2019   NA 143 09/17/2019   K 3.9 09/17/2019   CL 105 09/17/2019   CO2 25 09/17/2019   Lab Results  Component Value Date   CHOL 227 (H) 09/17/2019   HDL 57 09/17/2019   LDLCALC 137 (H) 09/17/2019   TRIG 187 (H) 09/17/2019   CHOLHDL 4.0 09/17/2019   Lab Results  Component Value Date   TSH 2.150 09/17/2019   Lab Results  Component Value Date   HGBA1C 5.6 09/17/2019   Lab Results  Component Value Date   WBC 6.3 09/17/2019   HGB 13.6 09/17/2019   HCT 41.2 09/17/2019   MCV 89 09/17/2019   PLT 318 09/17/2019   Lab Results  Component Value Date   ALT 16 09/17/2019   AST 19 09/17/2019   ALKPHOS 67 09/17/2019   BILITOT 0.2 09/17/2019     Review of Systems  Constitutional: Negative for chills,  fatigue and fever.  Respiratory: Negative for chest tightness and shortness of breath.   Gastrointestinal: Negative for bowel incontinence.  Genitourinary: Negative for bladder incontinence, difficulty urinating, dysuria and pelvic pain.  Musculoskeletal: Positive for back pain. Negative for arthralgias and joint swelling.  Neurological: Negative for dizziness, weakness and headaches.    Patient Active Problem List   Diagnosis Date Noted  . Generalized osteoarthritis of hand 01/27/2017  . Chronic tension-type headache, not intractable 01/17/2017  . Mood disorder (HCC) 07/13/2016  . Foot pain, left 07/13/2016  . Prediabetes 11/07/2015  . Hyperlipidemia 11/07/2015  . Low serum thyroid stimulating hormone (TSH) 09/30/2015  . Osteoarthritis 09/29/2015  . Menopausal syndrome (hot flashes) 09/29/2015  . Obesity, Class I, BMI 30-34.9 09/26/2015  . Vitamin D deficiency 09/26/2015  . Migraine without status migrainosus, not intractable 09/26/2015  . Moderate intermittent asthma without complication 09/26/2015  . Colon polyp 09/26/2015  . Synovial cyst of left knee 09/26/2015  . Environmental and seasonal allergies 09/26/2015    Allergies  Allergen Reactions  . Clavulanic Acid Diarrhea  . Oxycodone Itching  . Tape Rash    Past Surgical History:  Procedure Laterality Date  . ABDOMINAL HYSTERECTOMY    . APPENDECTOMY    . CESAREAN SECTION     x 1  . COLONOSCOPY  2012   due in 2017 for  suspicious lesion  . COLONOSCOPY WITH PROPOFOL N/A 08/20/2016   Procedure: COLONOSCOPY WITH PROPOFOL;  Surgeon: Midge Minium, MD;  Location: [facility];  Service: Endoscopy;  Laterality: N/A;  . PARTIAL HYSTERECTOMY     one ovary left  . REPAIR ANKLE LIGAMENT     from fall  . ROTATOR CUFF REPAIR Left    shoulder  . TONSILLECTOMY      Social History   Tobacco Use  . Smoking status: Never Smoker  . Smokeless tobacco: Never Used  Vaping Use  . Vaping Use: Never used  Substance Use  Topics  . Alcohol use: Yes    Alcohol/week: 0.0 standard drinks    Comment: 2 drinks - 2x/mo  . Drug use: No     Medication list has been reviewed and updated.  Current Meds  Medication Sig  . acetaminophen (TYLENOL) 650 MG CR tablet Take 650 mg by mouth 2 (two) times daily.  Marland Kitchen albuterol (PROVENTIL HFA;VENTOLIN HFA) 108 (90 BASE) MCG/ACT inhaler Inhale 2 puffs into the lungs every 6 (six) hours as needed for wheezing or shortness of breath.  Marland Kitchen albuterol (PROVENTIL) (2.5 MG/3ML) 0.083% nebulizer solution Take 3 mLs (2.5 mg total) by nebulization every 6 (six) hours as needed for wheezing or shortness of breath.  . citalopram (CELEXA) 20 MG tablet Take 1 tablet (20 mg total) by mouth daily.  . cyanocobalamin 500 MCG tablet Take 500 mcg by mouth daily.  Marland Kitchen loratadine (CLARITIN) 10 MG tablet Take 10 mg by mouth daily.  . Melatonin 10 MG CAPS Take 5 mg by mouth every other day.   . montelukast (SINGULAIR) 10 MG tablet Take 1 tablet (10 mg total) by mouth at bedtime.  . Multiple Vitamins-Minerals (MULTIVITAMIN ADULTS 50+ PO) Take 1 capsule by mouth daily.  . rizatriptan (MAXALT-MLT) 10 MG disintegrating tablet Take 1 tablet by mouth at headache onset may repeat once in 2 hours if needed.  No more than 2 tabs in 24 hours  . tiZANidine (ZANAFLEX) 4 MG tablet Take 4 mg by mouth 3 (three) times daily.  Marland Kitchen topiramate (TOPAMAX) 25 MG tablet Take 1 tablet (25 mg total) by mouth daily.  . Triamcinolone Acetonide (NASACORT ALLERGY 24HR NA) Place into the nose.  . Vitamin D, Cholecalciferol, 1000 UNITS CAPS Take 1 capsule by mouth daily.     PHQ 2/9 Scores 03/04/2020 09/17/2019 08/08/2019 01/05/2019  PHQ - 2 Score 1 0 1 0  PHQ- 9 Score 1 0 - -    No flowsheet data found.  BP Readings from Last 3 Encounters:  07/29/20 136/66  03/04/20 132/78  09/17/19 120/70    Physical Exam Vitals and nursing note reviewed.  Constitutional:      General: She is not in acute distress.    Appearance: She is  well-developed.  HENT:     Head: Normocephalic and atraumatic.  Cardiovascular:     Rate and Rhythm: Normal rate and regular rhythm.  Pulmonary:     Effort: Pulmonary effort is normal. No respiratory distress.     Breath sounds: No wheezing or rhonchi.  Musculoskeletal:        General: Normal range of motion.     Cervical back: Normal range of motion.     Thoracic back: Spasms present. No bony tenderness.     Lumbar back: Spasms present. No bony tenderness. Negative right straight leg raise test and negative left straight leg raise test.       Back:     Comments:  Muscle spasm where indicated Also discomfort over right SI region  Skin:    General: Skin is warm and dry.     Findings: No rash.  Neurological:     Mental Status: She is alert and oriented to person, place, and time.  Psychiatric:        Behavior: Behavior normal.        Thought Content: Thought content normal.     Wt Readings from Last 3 Encounters:  07/29/20 181 lb (82.1 kg)  03/04/20 182 lb (82.6 kg)  09/17/19 181 lb (82.1 kg)    BP 136/66   Pulse 85   Ht 5\' 4"  (1.626 m)   Wt 181 lb (82.1 kg)   SpO2 99%   BMI 31.07 kg/m   Assessment and Plan: 1. Muscle spasm of back Continue Tizanidine q 6 hours - taper as able Resume Advil Use heat in the evening Use Lidoderm patches during the day but remove before using heat   Partially dictated using . Any errors are unintentional.  Animal nutritionist, MD [facility]  07/29/2020

## 2020-09-17 ENCOUNTER — Encounter: Payer: Self-pay | Admitting: Internal Medicine

## 2020-09-17 ENCOUNTER — Ambulatory Visit (INDEPENDENT_AMBULATORY_CARE_PROVIDER_SITE_OTHER): Payer: BC Managed Care – PPO | Admitting: Internal Medicine

## 2020-09-17 ENCOUNTER — Other Ambulatory Visit: Payer: Self-pay

## 2020-09-17 VITALS — BP 128/64 | HR 78 | Temp 97.9°F | Ht 64.0 in | Wt 179.4 lb

## 2020-09-17 DIAGNOSIS — Z0000 Encounter for general adult medical examination without abnormal findings: Secondary | ICD-10-CM

## 2020-09-17 DIAGNOSIS — J4520 Mild intermittent asthma, uncomplicated: Secondary | ICD-10-CM

## 2020-09-17 DIAGNOSIS — R7303 Prediabetes: Secondary | ICD-10-CM | POA: Diagnosis not present

## 2020-09-17 DIAGNOSIS — E782 Mixed hyperlipidemia: Secondary | ICD-10-CM

## 2020-09-17 DIAGNOSIS — R7989 Other specified abnormal findings of blood chemistry: Secondary | ICD-10-CM | POA: Diagnosis not present

## 2020-09-17 DIAGNOSIS — G43909 Migraine, unspecified, not intractable, without status migrainosus: Secondary | ICD-10-CM

## 2020-09-17 DIAGNOSIS — F39 Unspecified mood [affective] disorder: Secondary | ICD-10-CM

## 2020-09-17 LAB — POCT URINALYSIS DIPSTICK
Bilirubin, UA: NEGATIVE
Blood, UA: NEGATIVE
Glucose, UA: NEGATIVE
Ketones, UA: NEGATIVE
Leukocytes, UA: NEGATIVE
Nitrite, UA: NEGATIVE
Protein, UA: NEGATIVE
Spec Grav, UA: 1.02 (ref 1.010–1.025)
Urobilinogen, UA: 0.2 E.U./dL
pH, UA: 6 (ref 5.0–8.0)

## 2020-09-17 MED ORDER — CITALOPRAM HYDROBROMIDE 20 MG PO TABS: 20.0000 mg | ORAL_TABLET | Freq: Every day | ORAL | 1 refills | Status: DC

## 2020-09-17 MED ORDER — ALBUTEROL SULFATE HFA 108 (90 BASE) MCG/ACT IN AERS: 2.0000 | INHALATION_SPRAY | Freq: Four times a day (QID) | RESPIRATORY_TRACT | 1 refills | Status: DC | PRN

## 2020-09-17 NOTE — Progress Notes (Signed)
Date:  09/17/2020   Name:  [name]   DOB:  Nov 06, 1961   MRN:  [id number]   Chief Complaint: Annual Exam (Breast Exam - no pap.)  [name] is a 59 y.o. female who presents today for her Complete Annual Exam. She feels well. She reports exercising - walking daily. She reports she is sleeping fairly well. Breast complaints - none.  Mammogram: 05/2020 DEXA: none Pap smear: discontinued Colonoscopy: 07/2016  Immunization History  Administered Date(s) Administered  . Influenza,inj,Quad PF,6+ Mos 07/22/2017, 08/08/2019  . Influenza-Unspecified 09/03/2015, 09/05/2016, 07/23/2020  . PFIZER SARS-COV-2 Vaccination 02/01/2020, 02/27/2020  . Tdap 08/08/2019    Depression        This is a chronic problem.The problem is unchanged.  Associated symptoms include headaches.  Associated symptoms include no fatigue.  Past treatments include SSRIs - Selective serotonin reuptake inhibitors.  Compliance with treatment is good.  Previous treatment provided significant relief. Migraine  This is a recurrent problem. Pertinent negatives include no abdominal pain, coughing, dizziness, fever, hearing loss, tinnitus or vomiting. Treatments tried: tramadol.  Asthma She complains of chest tightness and wheezing. There is no cough or shortness of breath. This is a recurrent problem. The problem occurs rarely. Associated symptoms include headaches. Pertinent negatives include no chest pain, fever or trouble swallowing. Her symptoms are alleviated by leukotriene antagonist and beta-agonist. She reports significant improvement on treatment. Her past medical history is significant for asthma.    Lab Results  Component Value Date   CREATININE 0.93 09/17/2019   BUN 14 09/17/2019   NA 143 09/17/2019   K 3.9 09/17/2019   CL 105 09/17/2019   CO2 25 09/17/2019   Lab Results  Component Value Date   CHOL 227 (H) 09/17/2019   HDL 57 09/17/2019   LDLCALC 137 (H) 09/17/2019   TRIG 187 (H) 09/17/2019    CHOLHDL 4.0 09/17/2019   Lab Results  Component Value Date   TSH 2.150 09/17/2019   Lab Results  Component Value Date   HGBA1C 5.6 09/17/2019   Lab Results  Component Value Date   WBC 6.3 09/17/2019   HGB 13.6 09/17/2019   HCT 41.2 09/17/2019   MCV 89 09/17/2019   PLT 318 09/17/2019   Lab Results  Component Value Date   ALT 16 09/17/2019   AST 19 09/17/2019   ALKPHOS 67 09/17/2019   BILITOT 0.2 09/17/2019     Review of Systems  Constitutional: Negative for chills, fatigue and fever.  HENT: Negative for congestion, hearing loss, tinnitus, trouble swallowing and voice change.   Eyes: Negative for visual disturbance.  Respiratory: Positive for wheezing. Negative for cough, chest tightness and shortness of breath.   Cardiovascular: Negative for chest pain, palpitations and leg swelling.  Gastrointestinal: Negative for abdominal pain, constipation, diarrhea and vomiting.  Endocrine: Negative for polydipsia and polyuria.  Genitourinary: Negative for dysuria, frequency, genital sores, vaginal bleeding and vaginal discharge.  Musculoskeletal: Negative for arthralgias, gait problem and joint swelling.  Skin: Negative for color change and rash.  Neurological: Positive for headaches. Negative for dizziness, tremors and light-headedness.  Hematological: Negative for adenopathy. Does not bruise/bleed easily.  Psychiatric/Behavioral: Positive for depression. Negative for dysphoric mood and sleep disturbance. The patient is nervous/anxious.     Patient Active Problem List   Diagnosis Date Noted  . Generalized osteoarthritis of hand 01/27/2017  . Chronic tension-type headache, not intractable 01/17/2017  . Mood disorder (HCC) 07/13/2016  . Foot pain, left 07/13/2016  . Prediabetes 11/07/2015  .  Mixed hyperlipidemia 11/07/2015  . Low serum thyroid stimulating hormone (TSH) 09/30/2015  . Osteoarthritis 09/29/2015  . Menopausal syndrome (hot flashes) 09/29/2015  . Obesity, Class I,  BMI 30-34.9 09/26/2015  . Vitamin D deficiency 09/26/2015  . Migraine without status migrainosus, not intractable 09/26/2015  . Allergic asthma, mild intermittent, uncomplicated 09/26/2015  . Colon polyp 09/26/2015  . Synovial cyst of left knee 09/26/2015  . Environmental and seasonal allergies 09/26/2015    Allergies  Allergen Reactions  . Clavulanic Acid Diarrhea  . Oxycodone Itching  . Tape Rash    Past Surgical History:  Procedure Laterality Date  . ABDOMINAL HYSTERECTOMY    . APPENDECTOMY    . CESAREAN SECTION     x 1  . COLONOSCOPY  2012   due in 2017 for suspicious lesion  . COLONOSCOPY WITH PROPOFOL N/A 08/20/2016   Procedure: COLONOSCOPY WITH PROPOFOL;  Surgeon: Midge Minium, MD;  Location: [facility];  Service: Endoscopy;  Laterality: N/A;  . PARTIAL HYSTERECTOMY     one ovary left  . REPAIR ANKLE LIGAMENT     from fall  . ROTATOR CUFF REPAIR Left    shoulder  . TONSILLECTOMY      Social History   Tobacco Use  . Smoking status: Never Smoker  . Smokeless tobacco: Never Used  Vaping Use  . Vaping Use: Never used  Substance Use Topics  . Alcohol use: Yes    Alcohol/week: 0.0 standard drinks    Comment: 2 drinks - 2x/mo  . Drug use: No     Medication list has been reviewed and updated.  Current Meds  Medication Sig  . acetaminophen (TYLENOL) 650 MG CR tablet Take 650 mg by mouth 2 (two) times daily.  Marland Kitchen albuterol (PROVENTIL HFA;VENTOLIN HFA) 108 (90 BASE) MCG/ACT inhaler Inhale 2 puffs into the lungs every 6 (six) hours as needed for wheezing or shortness of breath.  Marland Kitchen albuterol (PROVENTIL) (2.5 MG/3ML) 0.083% nebulizer solution Take 3 mLs (2.5 mg total) by nebulization every 6 (six) hours as needed for wheezing or shortness of breath.  . citalopram (CELEXA) 20 MG tablet Take 1 tablet (20 mg total) by mouth daily.  . cyanocobalamin 500 MCG tablet Take 500 mcg by mouth daily.  Marland Kitchen loratadine (CLARITIN) 10 MG tablet Take 10 mg by mouth daily.  .  Melatonin 10 MG CAPS Take 5 mg by mouth every other day.   . montelukast (SINGULAIR) 10 MG tablet Take 1 tablet (10 mg total) by mouth at bedtime.  . Multiple Vitamins-Minerals (MULTIVITAMIN ADULTS 50+ PO) Take 1 capsule by mouth daily.  . rizatriptan (MAXALT-MLT) 10 MG disintegrating tablet Take 1 tablet by mouth at headache onset may repeat once in 2 hours if needed.  No more than 2 tabs in 24 hours  . topiramate (TOPAMAX) 25 MG tablet Take 1 tablet (25 mg total) by mouth daily.  . Triamcinolone Acetonide (NASACORT ALLERGY 24HR NA) Place into the nose.  . Vitamin D, Cholecalciferol, 1000 UNITS CAPS Take 1 capsule by mouth daily.     PHQ 2/9 Scores 09/17/2020 03/04/2020 09/17/2019 08/08/2019  PHQ - 2 Score 2 1 0 1  PHQ- 9 Score 6 1 0 -    GAD 7 : Generalized Anxiety Score 09/17/2020  Nervous, Anxious, on Edge 1  Control/stop worrying 1  Worry too much - different things 1  Trouble relaxing 1  Restless 1  Easily annoyed or irritable 1  Afraid - awful might happen 1  Total GAD 7 Score  7  Anxiety Difficulty Not difficult at all    BP Readings from Last 3 Encounters:  09/17/20 128/64  07/29/20 136/66  03/04/20 132/78    Physical Exam Vitals and nursing note reviewed.  Constitutional:      General: She is not in acute distress.    Appearance: She is well-developed.  HENT:     Head: Normocephalic and atraumatic.     Right Ear: Tympanic membrane and ear canal normal.     Left Ear: Tympanic membrane and ear canal normal.     Nose:     Right Sinus: No maxillary sinus tenderness.     Left Sinus: No maxillary sinus tenderness.  Eyes:     General: No scleral icterus.       Right eye: No discharge.        Left eye: No discharge.     Conjunctiva/sclera: Conjunctivae normal.  Neck:     Thyroid: No thyromegaly.     Vascular: No carotid bruit.  Cardiovascular:     Rate and Rhythm: Normal rate and regular rhythm.     Pulses: Normal pulses.     Heart sounds: Normal heart sounds.    Pulmonary:     Effort: Pulmonary effort is normal. No respiratory distress.     Breath sounds: No wheezing.  Chest:     Breasts:        Right: No mass, nipple discharge, skin change or tenderness.        Left: No mass, nipple discharge, skin change or tenderness.  Abdominal:     General: Bowel sounds are normal.     Palpations: Abdomen is soft.     Tenderness: There is no abdominal tenderness.  Musculoskeletal:     Cervical back: Normal range of motion. No erythema.     Right lower leg: No edema.     Left lower leg: No edema.  Lymphadenopathy:     Cervical: No cervical adenopathy.  Skin:    General: Skin is warm and dry.     Findings: No rash.  Neurological:     Mental Status: She is alert and oriented to person, place, and time.     Cranial Nerves: No cranial nerve deficit.     Sensory: No sensory deficit.     Deep Tendon Reflexes: Reflexes are normal and symmetric.  Psychiatric:        Attention and Perception: Attention normal.        Mood and Affect: Mood normal.     Wt Readings from Last 3 Encounters:  09/17/20 179 lb 6.4 oz (81.4 kg)  07/29/20 181 lb (82.1 kg)  03/04/20 182 lb (82.6 kg)    BP 128/64   Pulse 78   Temp 97.9 F (36.6 C) (Oral)   Ht 5\' 4"  (1.626 m)   Wt 179 lb 6.4 oz (81.4 kg)   SpO2 98%   BMI 30.79 kg/m   Assessment and Plan: 1. Annual physical exam Normal exam Continue healthy diet and exercise - POCT urinalysis dipstick  3. Migraine without status migrainosus, not intractable, unspecified migraine type Intermittent, unchanged but continue to respond to Imitrex Continue Topamax preventative - Comprehensive metabolic panel  4. Allergic asthma, mild intermittent, uncomplicated Stable without worsening - CBC with Differential/Platelet - albuterol (VENTOLIN HFA) 108 (90 Base) MCG/ACT inhaler; Inhale 2 puffs into the lungs every 6 (six) hours as needed for wheezing or shortness of breath.  Dispense: 18 g; Refill: 1  5. Mixed  hyperlipidemia Check labs and  advise - Lipid panel  6. Prediabetes Continue healthy diet - Hemoglobin A1c  7. Low serum thyroid stimulating hormone (TSH) Check labs and advise - TSH + free T4  8. Mood disorder (HCC) Clinically stable on current regimen with good control of symptoms, No SI or HI. Will continue current therapy. - citalopram (CELEXA) 20 MG tablet; Take 1 tablet (20 mg total) by mouth daily.  Dispense: 90 tablet; Refill: 1   Partially dictated using Animal nutritionistDragon software. Any errors are unintentional.  Bari EdwardLaura Daniil Labarge, MD [facility]  09/17/2020

## 2020-09-18 LAB — COMPREHENSIVE METABOLIC PANEL
ALT: 12 IU/L (ref 0–32)
AST: 12 IU/L (ref 0–40)
Albumin/Globulin Ratio: 2 (ref 1.2–2.2)
Albumin: 4.5 g/dL (ref 3.8–4.9)
Alkaline Phosphatase: 76 IU/L (ref 44–121)
BUN/Creatinine Ratio: 21 (ref 12–28)
BUN: 20 mg/dL (ref 8–27)
Bilirubin Total: 0.3 mg/dL (ref 0.0–1.2)
CO2: 25 mmol/L (ref 20–29)
Calcium: 9.2 mg/dL (ref 8.7–10.3)
Chloride: 102 mmol/L (ref 96–106)
Creatinine, Ser: 0.97 mg/dL (ref 0.57–1.00)
GFR calc Af Amer: 73 mL/min/{1.73_m2} (ref >59)
GFR calc non Af Amer: 64 mL/min/{1.73_m2} (ref >59)
Globulin, Total: 2.2 g/dL (ref 1.5–4.5)
Glucose: 94 mg/dL (ref 65–99)
Potassium: 4.1 mmol/L (ref 3.5–5.2)
Sodium: 140 mmol/L (ref 134–144)
Total Protein: 6.7 g/dL (ref 6.0–8.5)

## 2020-09-18 LAB — CBC WITH DIFFERENTIAL/PLATELET
Basophils Absolute: 0 10*3/uL (ref 0.0–0.2)
Basos: 0 %
EOS (ABSOLUTE): 0.1 10*3/uL (ref 0.0–0.4)
Eos: 2 %
Hematocrit: 41.4 % (ref 34.0–46.6)
Hemoglobin: 14.2 g/dL (ref 11.1–15.9)
Immature Grans (Abs): 0 10*3/uL (ref 0.0–0.1)
Immature Granulocytes: 0 %
Lymphocytes Absolute: 2.5 10*3/uL (ref 0.7–3.1)
Lymphs: 37 %
MCH: 31.1 pg (ref 26.6–33.0)
MCHC: 34.3 g/dL (ref 31.5–35.7)
MCV: 91 fL (ref 79–97)
Monocytes Absolute: 0.3 10*3/uL (ref 0.1–0.9)
Monocytes: 5 %
Neutrophils Absolute: 3.8 10*3/uL (ref 1.4–7.0)
Neutrophils: 56 %
Platelets: 300 10*3/uL (ref 150–450)
RBC: 4.57 x10E6/uL (ref 3.77–5.28)
RDW: 13.4 % (ref 11.7–15.4)
WBC: 6.7 10*3/uL (ref 3.4–10.8)

## 2020-09-18 LAB — LIPID PANEL
Chol/HDL Ratio: 3.5 ratio (ref 0.0–4.4)
Cholesterol, Total: 245 mg/dL — ABNORMAL HIGH (ref 100–199)
HDL: 70 mg/dL (ref >39)
LDL Chol Calc (NIH): 161 mg/dL — ABNORMAL HIGH (ref 0–99)
Triglycerides: 80 mg/dL (ref 0–149)
VLDL Cholesterol Cal: 14 mg/dL (ref 5–40)

## 2020-09-18 LAB — TSH+FREE T4
Free T4: 1.03 ng/dL (ref 0.82–1.77)
TSH: 1.33 u[IU]/mL (ref 0.450–4.500)

## 2020-09-18 LAB — HEMOGLOBIN A1C
Est. average glucose Bld gHb Est-mCnc: 117 mg/dL
Hgb A1c MFr Bld: 5.7 % — ABNORMAL HIGH (ref 4.8–5.6)

## 2020-09-23 ENCOUNTER — Encounter: Payer: Self-pay | Admitting: Internal Medicine

## 2020-09-23 DIAGNOSIS — Z90710 Acquired absence of both cervix and uterus: Secondary | ICD-10-CM | POA: Insufficient documentation

## 2020-11-17 ENCOUNTER — Other Ambulatory Visit: Payer: Self-pay

## 2020-11-17 ENCOUNTER — Encounter: Payer: Self-pay | Admitting: Internal Medicine

## 2020-11-17 ENCOUNTER — Ambulatory Visit (INDEPENDENT_AMBULATORY_CARE_PROVIDER_SITE_OTHER): Payer: Self-pay | Admitting: Internal Medicine

## 2020-11-17 VITALS — Ht 64.0 in | Wt 180.0 lb

## 2020-11-17 DIAGNOSIS — J0100 Acute maxillary sinusitis, unspecified: Secondary | ICD-10-CM

## 2020-11-17 MED ORDER — AZITHROMYCIN 250 MG PO TABS: ORAL_TABLET | ORAL | 0 refills | Status: AC

## 2020-11-17 NOTE — Progress Notes (Signed)
Date:  11/17/2020   Name:  [name]   DOB:  [birth year]   MRN:  [id number]  I connected with this patient, [name], by telephone at the patient's car. Telephone was used due to connectivity issues with caregility.  I verified that I am speaking with the correct person using two identifiers. This visit was conducted via telephone due to the Covid-19 outbreak from my office at [facility] in [place], [place]. I discussed the limitations, risks, security and privacy concerns of performing an evaluation and management service by telephone. I also discussed with the patient that there may be a patient responsible charge related to this service. The patient expressed understanding and agreed to proceed.   Chief Complaint: Cough (Started Thursday night with sore throat and cough. Sob with activity but not at rest. Sleeping a lot. No fevers. Can still taste and smell. No production - clear if any.)  Cough This is a new problem. The current episode started in the past 7 days. The problem has been unchanged. The cough is productive of sputum. Associated symptoms include chills, ear congestion, shortness of breath (with activity) and wheezing. Pertinent negatives include no chest pain, fever, postnasal drip or sore throat. She has tried a beta-agonist inhaler for the symptoms. The treatment provided moderate relief. had Covid test three days ago - not reported out yet  Sinus Problem This is a new problem. The current episode started in the past 7 days. The problem is unchanged. There has been no fever. The pain is mild. Associated symptoms include chills, congestion, coughing, shortness of breath (with activity) and sinus pressure. Pertinent negatives include no sore throat. Past treatments include oral decongestants. The treatment provided mild relief.   Immunization History  Administered Date(s) Administered  . Influenza, Quadrivalent, Recombinant, Inj, Pf 07/16/2020  .  Influenza,inj,Quad PF,6+ Mos 07/22/2017, 08/08/2019  . Influenza-Unspecified 09/03/2015, 09/05/2016, 07/23/2020  . Moderna Sars-Covid-2 Vaccination 11/02/2020  . PFIZER SARS-COV-2 Vaccination 02/01/2020, 02/27/2020  . PPD Test 07/16/2020  . Tdap 08/08/2019    Lab Results  Component Value Date   CREATININE 0.97 09/17/2020   BUN 20 09/17/2020   NA 140 09/17/2020   K 4.1 09/17/2020   CL 102 09/17/2020   CO2 25 09/17/2020   Lab Results  Component Value Date   CHOL 245 (H) 09/17/2020   HDL 70 09/17/2020   LDLCALC 161 (H) 09/17/2020   TRIG 80 09/17/2020   CHOLHDL 3.5 09/17/2020   Lab Results  Component Value Date   TSH 1.330 09/17/2020   Lab Results  Component Value Date   HGBA1C 5.7 (H) 09/17/2020   Lab Results  Component Value Date   WBC 6.7 09/17/2020   HGB 14.2 09/17/2020   HCT 41.4 09/17/2020   MCV 91 09/17/2020   PLT 300 09/17/2020   Lab Results  Component Value Date   ALT 12 09/17/2020   AST 12 09/17/2020   ALKPHOS 76 09/17/2020   BILITOT 0.3 09/17/2020     Review of Systems  Constitutional: Positive for chills. Negative for fever.  HENT: Positive for congestion, sinus pressure and sinus pain. Negative for postnasal drip, sore throat and trouble swallowing.   Respiratory: Positive for cough, shortness of breath (with activity) and wheezing.   Cardiovascular: Negative for chest pain.    Patient Active Problem List   Diagnosis Date Noted  . Status post total hysterectomy and bilateral salpingo-oophorectomy 09/23/2020  . Generalized osteoarthritis of hand 01/27/2017  . Chronic tension-type headache, not intractable  01/17/2017  . Mood disorder (HCC) 07/13/2016  . Foot pain, left 07/13/2016  . Prediabetes 11/07/2015  . Mixed hyperlipidemia 11/07/2015  . Low serum thyroid stimulating hormone (TSH) 09/30/2015  . Osteoarthritis 09/29/2015  . Menopausal syndrome (hot flashes) 09/29/2015  . Obesity, Class I, BMI 30-34.9 09/26/2015  . Vitamin D deficiency  09/26/2015  . Migraine without status migrainosus, not intractable 09/26/2015  . Allergic asthma, mild intermittent, uncomplicated 09/26/2015  . Colon polyp 09/26/2015  . Synovial cyst of left knee 09/26/2015  . Environmental and seasonal allergies 09/26/2015    Allergies  Allergen Reactions  . Clavulanic Acid Diarrhea  . Oxycodone Itching  . Tape Rash    Past Surgical History:  Procedure Laterality Date  . ABDOMINAL HYSTERECTOMY    . APPENDECTOMY    . CESAREAN SECTION     x 1  . COLONOSCOPY  2012   due in 2017 for suspicious lesion  . COLONOSCOPY WITH PROPOFOL N/A 08/20/2016   Procedure: COLONOSCOPY WITH PROPOFOL;  Surgeon: Midge Minium, MD;  Location: [facility];  Service: Endoscopy;  Laterality: N/A;  . PARTIAL HYSTERECTOMY     one ovary left  . REPAIR ANKLE LIGAMENT     from fall  . ROTATOR CUFF REPAIR Left    shoulder  . TONSILLECTOMY      Social History   Tobacco Use  . Smoking status: Never Smoker  . Smokeless tobacco: Never Used  Vaping Use  . Vaping Use: Never used  Substance Use Topics  . Alcohol use: Yes    Alcohol/week: 0.0 standard drinks    Comment: 2 drinks - 2x/mo  . Drug use: No     Medication list has been reviewed and updated.  Current Meds  Medication Sig  . acetaminophen (TYLENOL) 650 MG CR tablet Take 650 mg by mouth 2 (two) times daily.  Marland Kitchen albuterol (PROVENTIL) (2.5 MG/3ML) 0.083% nebulizer solution Take 3 mLs (2.5 mg total) by nebulization every 6 (six) hours as needed for wheezing or shortness of breath.  Marland Kitchen albuterol (VENTOLIN HFA) 108 (90 Base) MCG/ACT inhaler Inhale 2 puffs into the lungs every 6 (six) hours as needed for wheezing or shortness of breath.  . cetirizine (ZYRTEC) 10 MG chewable tablet Chew 10 mg by mouth daily.  . citalopram (CELEXA) 20 MG tablet Take 1 tablet (20 mg total) by mouth daily.  . cyanocobalamin 500 MCG tablet Take 500 mcg by mouth daily.  . Melatonin 10 MG CAPS Take 5 mg by mouth every other day.    . montelukast (SINGULAIR) 10 MG tablet Take 1 tablet (10 mg total) by mouth at bedtime.  . Multiple Vitamins-Minerals (MULTIVITAMIN ADULTS 50+ PO) Take 1 capsule by mouth daily.  . rizatriptan (MAXALT-MLT) 10 MG disintegrating tablet Take 1 tablet by mouth at headache onset may repeat once in 2 hours if needed.  No more than 2 tabs in 24 hours  . topiramate (TOPAMAX) 25 MG tablet Take 1 tablet (25 mg total) by mouth daily.  . Triamcinolone Acetonide (NASACORT ALLERGY 24HR NA) Place into the nose.  . Vitamin D, Cholecalciferol, 1000 UNITS CAPS Take 1 capsule by mouth daily.     PHQ 2/9 Scores 11/17/2020 09/17/2020 03/04/2020 09/17/2019  PHQ - 2 Score 0 2 1 0  PHQ- 9 Score 0 6 1 0    GAD 7 : Generalized Anxiety Score 11/17/2020 09/17/2020  Nervous, Anxious, on Edge 0 1  Control/stop worrying 0 1  Worry too much - different things 0 1  Trouble  relaxing 0 1  Restless 0 1  Easily annoyed or irritable 0 1  Afraid - awful might happen 0 1  Total GAD 7 Score 0 7  Anxiety Difficulty Not difficult at all Not difficult at all    BP Readings from Last 3 Encounters:  09/17/20 128/64  07/29/20 136/66  03/04/20 132/78    Physical Exam HENT:     Nose:     Comments: Voice with mild hoarseness and nasal quality Pulmonary:     Effort: Pulmonary effort is normal.     Comments: No cough or dyspnea noted during the call Psychiatric:        Attention and Perception: Attention normal.        Mood and Affect: Mood normal.     Wt Readings from Last 3 Encounters:  11/17/20 180 lb (81.6 kg)  09/17/20 179 lb 6.4 oz (81.4 kg)  07/29/20 181 lb (82.1 kg)    Ht 5\' 4"  (1.626 m)   Wt 180 lb (81.6 kg)   BMI 30.90 kg/m   Assessment and Plan: 1. Acute non-recurrent maxillary sinusitis Suspect sinusitis with PND causing cough Pt will continue albuterol nebs or MDI Continue Delsym cough syrup Follow up with [facility] for the Covid test results.  Remain in quarantine as appropriate. Can call for a  return to work note in a few days - azithromycin (ZITHROMAX Z-PAK) 250 MG tablet; UAD  Dispense: 6 each; Refill: 0  I spent 10 minutes on this encounter. Partially dictated using . Any errors are unintentional.  Animal nutritionist, MD [facility]  11/17/2020

## 2020-11-18 ENCOUNTER — Encounter: Payer: Self-pay | Admitting: Internal Medicine

## 2020-12-14 ENCOUNTER — Encounter: Payer: Self-pay | Admitting: Internal Medicine

## 2020-12-15 ENCOUNTER — Other Ambulatory Visit: Payer: Self-pay | Admitting: Internal Medicine

## 2020-12-15 MED ORDER — TIZANIDINE HCL 4 MG PO TABS: 4.0000 mg | ORAL_TABLET | Freq: Three times a day (TID) | ORAL | 0 refills | Status: DC

## 2020-12-19 ENCOUNTER — Emergency Department: Payer: Self-pay

## 2020-12-19 ENCOUNTER — Observation Stay: Payer: Self-pay

## 2020-12-19 ENCOUNTER — Other Ambulatory Visit: Payer: Self-pay

## 2020-12-19 ENCOUNTER — Observation Stay
Admission: EM | Admit: 2020-12-19 | Discharge: 2020-12-20 | Disposition: A | Discharge status: Home / Self Care | Payer: Self-pay | Attending: Internal Medicine | Admitting: Internal Medicine

## 2020-12-19 ENCOUNTER — Encounter: Payer: Self-pay | Admitting: Emergency Medicine

## 2020-12-19 ENCOUNTER — Ambulatory Visit: Payer: Self-pay | Admitting: *Deleted

## 2020-12-19 DIAGNOSIS — R29898 Other symptoms and signs involving the musculoskeletal system: Secondary | ICD-10-CM

## 2020-12-19 DIAGNOSIS — I639 Cerebral infarction, unspecified: Principal | ICD-10-CM

## 2020-12-19 DIAGNOSIS — E669 Obesity, unspecified: Secondary | ICD-10-CM

## 2020-12-19 DIAGNOSIS — Z79899 Other long term (current) drug therapy: Secondary | ICD-10-CM | POA: Insufficient documentation

## 2020-12-19 DIAGNOSIS — Z20822 Contact with and (suspected) exposure to covid-19: Secondary | ICD-10-CM | POA: Insufficient documentation

## 2020-12-19 DIAGNOSIS — E782 Mixed hyperlipidemia: Secondary | ICD-10-CM | POA: Diagnosis present

## 2020-12-19 DIAGNOSIS — R479 Unspecified speech disturbances: Secondary | ICD-10-CM

## 2020-12-19 DIAGNOSIS — J45909 Unspecified asthma, uncomplicated: Secondary | ICD-10-CM | POA: Insufficient documentation

## 2020-12-19 DIAGNOSIS — G43009 Migraine without aura, not intractable, without status migrainosus: Secondary | ICD-10-CM

## 2020-12-19 DIAGNOSIS — R748 Abnormal levels of other serum enzymes: Secondary | ICD-10-CM

## 2020-12-19 DIAGNOSIS — I6529 Occlusion and stenosis of unspecified carotid artery: Secondary | ICD-10-CM

## 2020-12-19 HISTORY — DX: Cerebral infarction, unspecified: I63.9

## 2020-12-19 LAB — URINALYSIS, ROUTINE W REFLEX MICROSCOPIC
Bilirubin Urine: NEGATIVE
Glucose, UA: NEGATIVE mg/dL
Hgb urine dipstick: NEGATIVE
Ketones, ur: NEGATIVE mg/dL
Leukocytes,Ua: NEGATIVE
Nitrite: NEGATIVE
Protein, ur: NEGATIVE mg/dL
Specific Gravity, Urine: 1.012 (ref 1.005–1.030)
pH: 7 (ref 5.0–8.0)

## 2020-12-19 LAB — URINE DRUG SCREEN, QUALITATIVE (ARMC ONLY)
Amphetamines, Ur Screen: NOT DETECTED
Barbiturates, Ur Screen: NOT DETECTED
Benzodiazepine, Ur Scrn: NOT DETECTED
Cannabinoid 50 Ng, Ur ~~LOC~~: NOT DETECTED
Cocaine Metabolite,Ur ~~LOC~~: NOT DETECTED
MDMA (Ecstasy)Ur Screen: NOT DETECTED
Methadone Scn, Ur: NOT DETECTED
Opiate, Ur Screen: NOT DETECTED
Phencyclidine (PCP) Ur S: NOT DETECTED
Tricyclic, Ur Screen: NOT DETECTED

## 2020-12-19 LAB — COMPREHENSIVE METABOLIC PANEL
ALT: 15 U/L (ref 0–44)
AST: 20 U/L (ref 15–41)
Albumin: 4.2 g/dL (ref 3.5–5.0)
Alkaline Phosphatase: 51 U/L (ref 38–126)
Anion gap: 10 (ref 5–15)
BUN: 22 mg/dL — ABNORMAL HIGH (ref 6–20)
CO2: 26 mmol/L (ref 22–32)
Calcium: 9 mg/dL (ref 8.9–10.3)
Chloride: 104 mmol/L (ref 98–111)
Creatinine, Ser: 0.97 mg/dL (ref 0.44–1.00)
GFR, Estimated: >60 mL/min (ref >60)
Glucose, Bld: 114 mg/dL — ABNORMAL HIGH (ref 70–99)
Potassium: 3.6 mmol/L (ref 3.5–5.1)
Sodium: 140 mmol/L (ref 135–145)
Total Bilirubin: 0.9 mg/dL (ref 0.3–1.2)
Total Protein: 7.2 g/dL (ref 6.5–8.1)

## 2020-12-19 LAB — CBC
HCT: 42.5 % (ref 36.0–46.0)
Hemoglobin: 14.1 g/dL (ref 12.0–15.0)
MCH: 30.2 pg (ref 26.0–34.0)
MCHC: 33.2 g/dL (ref 30.0–36.0)
MCV: 91 fL (ref 80.0–100.0)
Platelets: 267 10*3/uL (ref 150–400)
RBC: 4.67 MIL/uL (ref 3.87–5.11)
RDW: 13.3 % (ref 11.5–15.5)
WBC: 6.2 10*3/uL (ref 4.0–10.5)
nRBC: 0 % (ref 0.0–0.2)

## 2020-12-19 LAB — DIFFERENTIAL
Abs Immature Granulocytes: 0.02 10*3/uL (ref 0.00–0.07)
Basophils Absolute: 0 10*3/uL (ref 0.0–0.1)
Basophils Relative: 0 %
Eosinophils Absolute: 0.2 10*3/uL (ref 0.0–0.5)
Eosinophils Relative: 3 %
Immature Granulocytes: 0 %
Lymphocytes Relative: 36 %
Lymphs Abs: 2.2 10*3/uL (ref 0.7–4.0)
Monocytes Absolute: 0.3 10*3/uL (ref 0.1–1.0)
Monocytes Relative: 5 %
Neutro Abs: 3.4 10*3/uL (ref 1.7–7.7)
Neutrophils Relative %: 56 %

## 2020-12-19 LAB — PROTIME-INR
INR: 1 (ref 0.8–1.2)
Prothrombin Time: 12.7 seconds (ref 11.4–15.2)

## 2020-12-19 LAB — SARS CORONAVIRUS 2 BY RT PCR (HOSPITAL ORDER, PERFORMED IN ~~LOC~~ HOSPITAL LAB): SARS Coronavirus 2: NEGATIVE

## 2020-12-19 LAB — ETHANOL: Alcohol, Ethyl (B): <10 mg/dL (ref <10)

## 2020-12-19 LAB — APTT: aPTT: 28 seconds (ref 24–36)

## 2020-12-19 MED ORDER — ACETAMINOPHEN 650 MG RE SUPP: 650.0000 mg | RECTAL | Status: DC | PRN

## 2020-12-19 MED ORDER — SODIUM CHLORIDE 0.9 % IV BOLUS
1000.0000 mL | Freq: Once | INTRAVENOUS | Status: AC
  Administered 2020-12-19: 1000 mL via INTRAVENOUS

## 2020-12-19 MED ORDER — ADULT MULTIVITAMIN W/MINERALS CH
ORAL_TABLET | Freq: Every day | ORAL | Status: DC
  Administered 2020-12-19 – 2020-12-20 (×2): 1 via ORAL
  Filled 2020-12-19 (×2): qty 1

## 2020-12-19 MED ORDER — DIPHENHYDRAMINE HCL 50 MG/ML IJ SOLN
25.0000 mg | Freq: Once | INTRAMUSCULAR | Status: AC
  Administered 2020-12-19: 25 mg via INTRAMUSCULAR
  Filled 2020-12-19: qty 1

## 2020-12-19 MED ORDER — MAGNESIUM SULFATE 2 GM/50ML IV SOLN
2.0000 g | Freq: Once | INTRAVENOUS | Status: AC
  Administered 2020-12-19: 2 g via INTRAVENOUS
  Filled 2020-12-19: qty 50

## 2020-12-19 MED ORDER — CLOPIDOGREL BISULFATE 75 MG PO TABS
75.0000 mg | ORAL_TABLET | Freq: Every day | ORAL | Status: DC
  Administered 2020-12-19 – 2020-12-20 (×2): 75 mg via ORAL
  Filled 2020-12-19 (×2): qty 1

## 2020-12-19 MED ORDER — LORATADINE 10 MG PO TABS: 10.0000 mg | ORAL_TABLET | Freq: Every day | ORAL | Status: DC | PRN

## 2020-12-19 MED ORDER — MELATONIN 5 MG PO TABS: 5.0000 mg | ORAL_TABLET | ORAL | Status: DC

## 2020-12-19 MED ORDER — KETOROLAC TROMETHAMINE 30 MG/ML IJ SOLN
30.0000 mg | Freq: Once | INTRAMUSCULAR | Status: AC
  Administered 2020-12-19: 30 mg via INTRAVENOUS
  Filled 2020-12-19: qty 1

## 2020-12-19 MED ORDER — TRIAMCINOLONE ACETONIDE 55 MCG/ACT NA AERO
1.0000 | INHALATION_SPRAY | Freq: Every day | NASAL | Status: DC | PRN
  Filled 2020-12-19: qty 10.8

## 2020-12-19 MED ORDER — ACETAMINOPHEN 325 MG PO TABS: 650.0000 mg | ORAL_TABLET | ORAL | Status: DC | PRN

## 2020-12-19 MED ORDER — CITALOPRAM HYDROBROMIDE 20 MG PO TABS
20.0000 mg | ORAL_TABLET | Freq: Every day | ORAL | Status: DC
  Administered 2020-12-20: 20 mg via ORAL
  Filled 2020-12-19 (×2): qty 1

## 2020-12-19 MED ORDER — ATORVASTATIN CALCIUM 80 MG PO TABS
80.0000 mg | ORAL_TABLET | Freq: Every day | ORAL | Status: DC
  Administered 2020-12-19 – 2020-12-20 (×2): 80 mg via ORAL
  Filled 2020-12-19 (×2): qty 4

## 2020-12-19 MED ORDER — TOPIRAMATE 25 MG PO TABS
25.0000 mg | ORAL_TABLET | Freq: Every day | ORAL | Status: DC
  Administered 2020-12-19: 25 mg via ORAL
  Filled 2020-12-19 (×3): qty 1

## 2020-12-19 MED ORDER — VITAMIN D 25 MCG (1000 UNIT) PO TABS
1000.0000 [IU] | ORAL_TABLET | Freq: Every day | ORAL | Status: DC
  Administered 2020-12-20: 1000 [IU] via ORAL
  Filled 2020-12-19: qty 1

## 2020-12-19 MED ORDER — ASPIRIN 300 MG RE SUPP: 300.0000 mg | Freq: Every day | RECTAL | Status: DC

## 2020-12-19 MED ORDER — ALBUTEROL SULFATE (2.5 MG/3ML) 0.083% IN NEBU: 2.5000 mg | INHALATION_SOLUTION | Freq: Four times a day (QID) | RESPIRATORY_TRACT | Status: DC | PRN

## 2020-12-19 MED ORDER — MONTELUKAST SODIUM 10 MG PO TABS
10.0000 mg | ORAL_TABLET | Freq: Every day | ORAL | Status: DC
  Administered 2020-12-19: 10 mg via ORAL
  Filled 2020-12-19: qty 1

## 2020-12-19 MED ORDER — ACETAMINOPHEN 160 MG/5ML PO SOLN
650.0000 mg | ORAL | Status: DC | PRN
  Filled 2020-12-19: qty 20.3

## 2020-12-19 MED ORDER — METOCLOPRAMIDE HCL 5 MG/ML IJ SOLN
10.0000 mg | Freq: Once | INTRAMUSCULAR | Status: AC
  Administered 2020-12-19: 10 mg via INTRAVENOUS
  Filled 2020-12-19: qty 2

## 2020-12-19 MED ORDER — ASPIRIN EC 81 MG PO TBEC
81.0000 mg | DELAYED_RELEASE_TABLET | Freq: Every day | ORAL | Status: DC
  Administered 2020-12-20: 81 mg via ORAL
  Filled 2020-12-19: qty 1

## 2020-12-19 MED ORDER — RIZATRIPTAN BENZOATE 10 MG PO TBDP: 10.0000 mg | ORAL_TABLET | Freq: Once | ORAL | Status: DC | PRN

## 2020-12-19 MED ORDER — STROKE: EARLY STAGES OF RECOVERY BOOK: Freq: Once | Status: DC

## 2020-12-19 MED ORDER — ASPIRIN EC 325 MG PO TBEC
325.0000 mg | DELAYED_RELEASE_TABLET | Freq: Every day | ORAL | Status: DC
  Administered 2020-12-19: 325 mg via ORAL
  Filled 2020-12-19: qty 1

## 2020-12-19 MED ORDER — CYANOCOBALAMIN 500 MCG PO TABS
500.0000 ug | ORAL_TABLET | Freq: Every day | ORAL | Status: DC
  Administered 2020-12-20: 500 ug via ORAL
  Filled 2020-12-19: qty 1

## 2020-12-19 MED ORDER — TIZANIDINE HCL 4 MG PO TABS
4.0000 mg | ORAL_TABLET | Freq: Three times a day (TID) | ORAL | Status: DC | PRN
  Filled 2020-12-19: qty 1

## 2020-12-19 MED ORDER — ACETAMINOPHEN ER 650 MG PO TBCR: 650.0000 mg | EXTENDED_RELEASE_TABLET | Freq: Two times a day (BID) | ORAL | Status: DC

## 2020-12-19 NOTE — ED Notes (Signed)
Pt to CT at this time.

## 2020-12-19 NOTE — Evaluation (Signed)
Occupational Therapy Evaluation Patient Details Name: [name] MRN: [id number] DOB: 1961/07/26 Today's Date: 12/19/2020    History of Present Illness [name] is a 59yoF who [facility] c Rt sided weakness and intermittent aphasia for 12 hours. MRI brain report says 'Small acute infarct left lower pons. Small chronic infarct left cerebellum.' PMH: OA, asthma, migraine, PONV. Covid test negative.   Clinical Impression   [name] was seen for OT evaluation this date. Prior to hospital admission, pt was Independent in all aspects of ADL/IADL, including working full time. Pt lives with her spouse in a 1 story home with 3 steps to enter and B rails. Currently pt reporting symptoms have resolved. Pt demonstrates baseline independence to perform ADL and mobility tasks and no strength, sensory, coordination, cognitive, or visual deficits appreciated with assessment. All family questions answered and pt verbalize acceptance of stroke education provided. No skilled OT needs identified. Will sign off. Please re-consult if additional OT needs arise.     Follow Up Recommendations  No OT follow up    Equipment Recommendations  None recommended by OT    Recommendations for Other Services       Precautions / Restrictions Precautions Precautions: Fall Restrictions Weight Bearing Restrictions: No      Mobility Bed Mobility Overal bed mobility: Independent                  Transfers Overall transfer level: Independent Equipment used: None                  Balance Overall balance assessment: Independent                                         ADL either performed or assessed with clinical judgement   ADL Overall ADL's : Independent                                                          Pertinent Vitals/Pain Pain Assessment: No/denies pain     Hand Dominance Right   Extremity/Trunk Assessment Upper  Extremity Assessment Upper Extremity Assessment: Overall WFL for tasks assessed RUE Deficits / Details: 5/5 strength, 5 finger opposition and RAM intact   Lower Extremity Assessment Lower Extremity Assessment: Defer to PT evaluation        Communication Communication Communication: No difficulties   Cognition Arousal/Alertness: Awake/alert Behavior During Therapy: WFL for tasks assessed/performed Overall Cognitive Status: Within Functional Limits for tasks assessed                                     General Comments       Exercises Exercises: Other exercises Other Exercises Other Exercises: Pt and family educated re: OT role, d/c recs, stroke education, neuromuscular re-ed, importance of stress mgmt for stroke prevention Other Exercises: Tooth brushing, walking ~20 ft, sitting/standing balance/tolerance   Shoulder Instructions      Home Living Family/patient expects to be discharged to:: Private residence Living Arrangements: Spouse/significant other Available Help at Discharge: Family;Available 24 hours/day Type of Home: House Home Access: Stairs to enter [employer] of Steps: 3 Entrance Stairs-Rails:  Left;Right;Can reach both Home Layout: One level               Home Equipment: None          Prior Functioning/Environment Level of Independence: Independent        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        OT Problem List: Decreased strength      OT Treatment/Interventions:      OT Goals(Current goals can be found in the care plan section) Acute Rehab OT Goals Patient Stated Goal: avoid having another stroke, be able to function at baseline OT Goal Formulation: With patient/family Time For Goal Achievement: 01/02/21 Potential to Achieve Goals: Good   AM-PAC OT "6 Clicks" Daily Activity     Outcome Measure Help from another person eating meals?: None Help from another person taking care of personal grooming?:  None Help from another person toileting, which includes using toliet, bedpan, or urinal?: None Help from another person bathing (including washing, rinsing, drying)?: None Help from another person to put on and taking off regular upper body clothing?: None Help from another person to put on and taking off regular lower body clothing?: None 6 Click Score: 24   End of Session    Activity Tolerance: Patient tolerated treatment well Patient left: in bed;with call bell/phone within reach;with nursing/sitter in room;with family/visitor present  OT Visit Diagnosis: Muscle weakness (generalized) (M62.81)                Time: 3254-9826 OT Time Calculation (min): 11 min Charges:  OT General Charges $OT Visit: 1 Visit OT Evaluation $OT Eval Low Complexity: 1 Low  Kathie Dike, M.S. OTR/L  12/19/20, 3:54 PM  ascom [phone]

## 2020-12-19 NOTE — Telephone Encounter (Signed)
Pt was advised to go to ED.  KP

## 2020-12-19 NOTE — Telephone Encounter (Signed)
Patient states she has severe headache yesterday- late yesterday she started having periods of weakness on R side- has been coming and going all night and this morning- advised ED now for evaluation.  Reason for Disposition . [1] Weakness (i.e., paralysis, loss of muscle strength) of the face, arm / hand, or leg / foot on one side of the body AND [2] sudden onset AND [3] brief (now gone)  Answer Assessment - Initial Assessment Questions 1. SYMPTOM: "What is the main symptom you are concerned about?" (e.g., weakness, numbness)     Weakness, numbness with migraine- R side 2. ONSET: "When did this start?" (minutes, hours, days; while sleeping)     Started yesterday- throughout the night 3. LAST NORMAL: "When was the last time you were normal (no symptoms)?"     Day before yesterday 4. PATTERN "Does this come and go, or has it been constant since it started?"  "Is it present now?"    Has been coming and going- all night and this morning 5. CARDIAC SYMPTOMS: "Have you had any of the following symptoms: chest pain, difficulty breathing, palpitations?"     no 6. NEUROLOGIC SYMPTOMS: "Have you had any of the following symptoms: headache, dizziness, vision loss, double vision, changes in speech, unsteady on your feet?"     Headache, weakness, numbness, speech changes 7. OTHER SYMPTOMS: "Do you have any other symptoms?"     no 8. PREGNANCY: "Is there any chance you are pregnant?" "When was your last menstrual period?"     n/a  Protocols used: NEUROLOGIC DEFICIT-A-AH

## 2020-12-19 NOTE — Evaluation (Addendum)
Physical Therapy Evaluation Patient Details Name: [name] MRN: [id number] DOB: 1961-04-29 Today's Date: 12/19/2020   History of Present Illness  [name] is a 59yoF who comes to [facility] on 12/19/20 c Rt sided weakness and intermittent aphasia for 12 hours. MRI brain report says 'Small acute infarct left lower pons. Small chronic infarct left cerebellum.' PMH: OA, asthma, migraine, PONV. Covid test negative.  Clinical Impression  Pt admitted with above diagnosis. Pt currently with functional limitations due to the deficits listed below (see "PT Problem List"). Upon entry, pt in bed, awake and agreeable to participate. The pt is alert, pleasant, interactive, and able to provide info regarding prior level of function, both in tolerance and independence. Examination revealing of veyr mild RLE weakness (~5%) and very, very mild RUE weakness (no perceived by patient), neither of which has any functional ramification in remainder of exam. Patient's performance this date reveals baseline level of independence in performing all basic mobility required for performance of activities of daily living. Pt requires no additional DME, physical assistance, or cues for safe participate in mobility. All education completed, and time is given to address all questions/concerns. No additional skilled PT services needed at this time, PT signing off. PT recommends daily ambulation ad lib or with nursing staff as needed to prevent deconditioning.      Follow Up Recommendations No PT follow up    Equipment Recommendations  None recommended by PT    Recommendations for Other Services       Precautions / Restrictions Precautions Precautions: Fall Restrictions Weight Bearing Restrictions: No      Mobility  Bed Mobility Overal bed mobility: Independent                  Transfers Overall transfer level: Independent Equipment used: None                Ambulation/Gait Ambulation/Gait  assistance: Modified independent (Device/Increase time) Gait Distance (Feet): 575 Feet Assistive device: None Gait Pattern/deviations: WFL(Within Functional Limits)     General Gait Details: lots of abnormal tele alarms, most of which are artifact. leads reset; AMB at baseline, no acute deficits.  Stairs            Wheelchair Mobility    Modified Rankin (Stroke Patients Only)       Balance Overall balance assessment: Independent                                           Pertinent Vitals/Pain Pain Assessment: No/denies pain    Home Living Family/patient expects to be discharged to:: Private residence Living Arrangements: Spouse/significant other Available Help at Discharge: Family;Available 24 hours/day Type of Home: House   Entrance Stairs-Rails: Left;Right;Can reach both Entrance Stairs-Number of Steps: 3 Home Layout: One level Home Equipment: None      Prior Function Level of Independence: Independent         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       Communication      Cognition                                              General Comments      Exercises     Assessment/Plan    PT Assessment Patent does not need any further PT services  PT Problem List Decreased strength;Decreased activity tolerance;Decreased knowledge of use of DME       PT Treatment Interventions DME instruction;Gait  training;Stair training;Functional mobility training;Therapeutic activities;Therapeutic exercise;Balance training;Neuromuscular re-education;Cognitive remediation;Patient/family education    PT Goals (Current goals can be found in the Care Plan section)  Acute Rehab PT Goals Patient Stated Goal: avoid having another stroke, be able to function at baseline PT Goal Formulation: All assessment and education complete, DC therapy Time For Goal Achievement: 01/02/21 Potential to Achieve Goals: Good    Frequency     Barriers to discharge        Co-evaluation               AM-PAC PT "6 Clicks" Mobility  Outcome Measure Help needed turning from your back to your side while in a flat bed without using bedrails?: None Help needed moving from lying on your back to sitting on the side of a flat bed without using bedrails?: None Help needed moving to and from a bed to a chair (including a wheelchair)?: None Help needed standing up from a chair using your arms (e.g., wheelchair or bedside chair)?: None Help needed to walk in hospital room?: A Little Help needed climbing 3-5 steps with a railing? : A Little 6 Click Score: 22    End of Session   Activity Tolerance: Patient tolerated treatment well;No increased pain Patient left: in bed;with family/visitor present;with call bell/phone within reach Nurse Communication: Mobility status PT Visit Diagnosis: Other symptoms and signs involving the nervous system (R29.898)    Time: 1696-7893 PT Time Calculation (min) (ACUTE ONLY): 33 min   Charges:   PT Evaluation $PT Eval Low Complexity: 1 Low     3:10 PM, 12/19/20 Rosamaria Lints, PT, DPT Physical Therapist - [facility]  [phone] (ASCOM)    Teena Mangus C 12/19/2020, 3:09 PM

## 2020-12-19 NOTE — Consult Note (Signed)
NEUROLOGY CONSULTATION NOTE   Date of service: December 19, 2020 Patient Name: [name] MRN:  [id number] DOB:  Jul 12, 1961 Reason for consult: "Stroke, pontine" _ _ _   _ __   _ __ _ _  __ __   _ __   __ _  History of Present Illness  [name] is a 60 y.o. female with PMH significant for arthritis, asthma, migraine who presents to the emergency department with headache, intermittent right-sided weakness and slurred speech.  Patient reports that she woke up on 12/18/2020 with a migraine and she took some naproxen.  She worked with a migraine but her symptoms kept worsening behind her eyes.  She eventually took some rizatriptan and then noted that about an hour later she started having episodes of right-sided weakness and facial droop and slurred speech.  Her symptoms were only lasting 3 to 5 minutes and they spontaneously resolved so she did not come to the ED.  She denies any symptoms at this time. Does not take aspirin at home, no prior personal hx of stroke. + Family hx of strokes. Does not smoke.   ROS   Constitutional Denies weight loss, fever and chills.   HEENT Denies changes in vision and hearing.   Respiratory Denies SOB and cough.   CV Denies palpitations and CP   GI Denies abdominal pain, nausea, vomiting and diarrhea.   GU Denies dysuria and urinary frequency.   MSK Denies myalgia and joint pain.   Skin Denies rash and pruritus.   Neurological Denies headache and syncope.   Psychiatric Denies recent changes in mood. Denies anxiety and depression.    Past History   Past Medical History:  Diagnosis Date   Arthritis    hands, feet, knees   Asthma    Migraine    Mood change    due to menopause/ hot flashes   PONV (postoperative nausea and vomiting)    Past Surgical History:  Procedure Laterality Date   ABDOMINAL HYSTERECTOMY     APPENDECTOMY     CESAREAN SECTION     x 1   COLONOSCOPY  2012   due in 2017 for suspicious lesion   COLONOSCOPY WITH  PROPOFOL N/A 08/20/2016   Procedure: COLONOSCOPY WITH PROPOFOL;  Surgeon: Midge Minium, MD;  Location: [facility];  Service: Endoscopy;  Laterality: N/A;   PARTIAL HYSTERECTOMY     one ovary left   REPAIR ANKLE LIGAMENT     from fall   ROTATOR CUFF REPAIR Left    shoulder   TONSILLECTOMY     Family History  Problem Relation Age of Onset   Diabetes Paternal Uncle    Breast cancer Paternal Grandmother 84   CAD Father    Social History   Socioeconomic History   Marital status: Married    Spouse name: Not on file   Number of children: Not on file   Years of education: Not on file   Highest education level: Not on file  Occupational History   Not on file  Tobacco Use   Smoking status: Never Smoker   Smokeless tobacco: Never Used  Vaping Use   Vaping Use: Never used  Substance and Sexual Activity   Alcohol use: Yes    Alcohol/week: 0.0 standard drinks    Comment: 2 drinks - 2x/mo   Drug use: No   Sexual activity: Not on file  Other Topics Concern   Not on file  Social History Narrative   Not on file  Social Determinants of Health   Financial Resource Strain: Not on file  Food Insecurity: Not on file  Transportation Needs: Not on file  Physical Activity: Not on file  Stress: Not on file  Social Connections: Not on file   Allergies  Allergen Reactions   Clavulanic Acid Diarrhea   Oxycodone Itching   Tape Rash    Medications  (Not in a hospital admission)    Vitals   Vitals:   12/19/20 1300 12/19/20 1315 12/19/20 1330 12/19/20 1345  BP: 124/63  132/86   Pulse: 62 66 62 62  Resp: 14 19 15 14   Temp:      TempSrc:      SpO2: 96% 99% 98% 98%  Weight:      Height:         Body mass index is 30.88 kg/m.  Physical Exam   General: Laying comfortably in bed; in no acute distress. HENT: Normal oropharynx and mucosa. Normal external appearance of ears and nose.  Neck: Supple, no pain or tenderness  CV: No JVD. No  peripheral edema.  Pulmonary: Symmetric Chest rise. Normal respiratory effort.  Abdomen: Soft to touch, non-tender.  Ext: No cyanosis, edema, or deformity  Skin: No rash. Normal palpation of skin.   Musculoskeletal: Normal digits and nails by inspection. No clubbing.   Neurologic Examination  Mental status/Cognition: Alert, oriented to self, place, month and year, good attention.  Speech/language: Fluent, comprehension intact, object naming intact, repetition intact.  Cranial nerves:   CN II Pupils equal and reactive to light, no VF deficits    CN III,IV,VI EOM intact, no gaze preference or deviation, no nystagmus    CN V normal sensation in V1, V2, and V3 segments bilaterally    CN VII no asymmetry, no nasolabial fold flattening    CN VIII normal hearing to speech    CN IX & X normal palatal elevation, no uvular deviation    CN XI 5/5 head turn and 5/5 shoulder shrug bilaterally    CN XII midline tongue protrusion    Motor:  Muscle bulk: normal, tone normal, pronator drift none tremor none Mvmt Root Nerve  Muscle Right Left Comments  SA C5/6 Ax Deltoid 5 5   EF C5/6 Mc Biceps 5 5   EE C6/7/8 Rad Triceps 5 5   WF C6/7 Med FCR 5 5   WE C7/8 PIN ECU 5 5   F Ab C8/T1 U ADM/FDI 5 5   HF L1/2/3 Fem Illopsoas 5 5   KE L2/3/4 Fem Quad 5 5   DF L4/5 D Peron Tib Ant 5 5   PF S1/2 Tibial Grc/Sol 5 5    Reflexes:  Right Left Comments  Pectoralis      Biceps (C5/6) 2 2   Brachioradialis (C5/6) 2 2    Triceps (C6/7) 2 2    Patellar (L3/4) 2 2    Achilles (S1) 2 2    Hoffman      Plantar     Jaw jerk    Sensation:  Light touch Intact throughout   Pin prick    Temperature    Vibration   Proprioception    Coordination/Complex Motor:  - Finger to Nose intact BL - Heel to shin intact BL - Rapid alternating movement normal - Gait: deferred.  Labs   CBC:  Recent Labs  Lab 12/19/20 0934  WBC 6.2  NEUTROABS 3.4  HGB 14.1  HCT 42.5  MCV 91.0  PLT 267  Basic  Metabolic Panel:  Lab Results  Component Value Date   NA 140 12/19/2020   K 3.6 12/19/2020   CO2 26 12/19/2020   GLUCOSE 114 (H) 12/19/2020   BUN 22 (H) 12/19/2020   CREATININE 0.97 12/19/2020   CALCIUM 9.0 12/19/2020   GFRNONAA >60 12/19/2020   GFRAA 73 09/17/2020   Lipid Panel:  Lab Results  Component Value Date   LDLCALC 161 (H) 09/17/2020   HgbA1c:  Lab Results  Component Value Date   HGBA1C 5.7 (H) 09/17/2020   Urine Drug Screen: No results found for: LABOPIA, COCAINSCRNUR, LABBENZ, AMPHETMU, THCU, LABBARB  Alcohol Level     Component Value Date/Time   ETH <10 12/19/2020 0934    CT Head without contrast: CTH was negative for a large hypodensity concerning for a large territory infarct or hyperdensity concerning for an ICH   MRI Brain: Punctate L pontine infarct.  MR Angio head without contrast and Carotid duplex BL: Pending  TTE: pending  Impression   [name] is a 60 y.o. female with hx of migraines, prediabetes, p/w intermittent R sided weakness and slurred speech. 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  Recommendations    Left pontine Stroke: - Etiology is likely small vessel disease - Frequent Neuro checks per stroke unit protocol - Vascular imaging with MRA Angio Head without contrast and US Carotid doppler is pending. - TTE is pending. - LDL pending. - Will start statin if LDL > 70 - Recommend HbA1c - Antithrombotic - Aspirin 81mg  and plavix 75mg  daily x 21 days, then aspirin 81mg  daily alone. - Recommend DVT ppx - SBP goal - permissive hypertension first 24 h < 220/110. Held home meds.  - Recommend Telemetry monitoring for arrythmia - Recommend bedside swallow screen prior to PO intake. - Stroke education booklet - Recommend PT/OT/SLP consult - Triptans and ergotamines are contraindicated in the setting of stroke and should not be reordered this admission and discontinued at discharge. - Follow up with outpatient  neurologist for further migraine control and to see if preventative agents are needed. ______________________________________________________________________   Thank you for the opportunity to take part in the care of this patient. If you have any further questions, please contact the neurology consultation attending.  Signed,  [facility] Pager Number _ _ _   _ __   _ __ _ _  __ __   _ __   __ _

## 2020-12-19 NOTE — ED Notes (Signed)
Pt taken to MRI at this time

## 2020-12-19 NOTE — ED Notes (Signed)
This RN notified by MRI that they will be coming to get patient at this time, this RN spoke with EDP and will hold medications until patient returns from MRI.

## 2020-12-19 NOTE — H&P (Addendum)
History and Physical    [name] RDE:[id number] DOB: [birth year] DOA: 12/19/2020  PCP: Reubin Milan, MD   Patient coming from: Home  I have personally briefly reviewed patient's old medical records in [facility]  Chief Complaint: Headache                               Right sided weakness  HPI: [name] is a 60 y.o. female with medical history significant for migraine headaches who presents to the ER for evaluation of multiple episodes of intermittent right sided weakness associated with slurred speech.  Patient states that she had a severe headache 1 day prior to her admission at about 5 PM similar to her migraines and took a dose of her rizatriptan.  1 hour after taking her rizatriptan she started having episodes of right-sided weakness associated with slurred speech and facial droop lasting less than 10 minutes each time.  Patient states that the episodes were worrisome but because they resolved she decided not to come into the ER for evaluation. She denies having any chest pain, no nausea, no vomiting, no abdominal pain, no dizziness, no lightheadedness, no diaphoresis, no palpitations, no fever, no chills, no cough, no diarrhea, no constipation, no urinary frequency, no dysuria, no nocturia. Labs show sodium 140, potassium 3.6, chloride 104, bicarb 26, glucose 114, BUN 22, creatinine 0.97, calcium 9.0, alkaline phosphatase 51, albumin 4.2, AST 20, ALT 15, total protein 7.2, white count 6.2, hemoglobin 14.1, hematocrit 42.5, MCV 91, RDW 13.3, platelet count 267, PT 12.7, INR 1.0 Her SARS coronavirus two PCR test is negative CT scan of the head without contrast shows no acute findings MRI of the brain without contrast shows a small acute infarct in the left lower pons.  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 Carotid Doppler shows mild plaque at the level of the right carotid bulb extending to the right ICA origin. Estimated right ICA stenosis is less than 50%. No  evidence of left ICA stenosis. Twelve-lead EKG reviewed by me shows normal sinus rhythm  ED Course: Patient is a 60 year old Caucasian female with a history of migraine headaches who presented to the ER for evaluation of an episode of severe headache associated with dysarthria and right-sided weakness.  MRI of the brain shows a small acute infarct in the left lower pons.  Patient will be referred to observation status for further evaluation.  Review of Systems: As per HPI otherwise all systems reviewed and negative.    Past Medical History:  Diagnosis Date  . Arthritis    hands, feet, knees  . Asthma   . Migraine   . Mood change    due to menopause/ hot flashes  . PONV (postoperative nausea and vomiting)     Past Surgical History:  Procedure Laterality Date  . ABDOMINAL HYSTERECTOMY    . APPENDECTOMY    . CESAREAN SECTION     x 1  . COLONOSCOPY  2012   due in 2017 for suspicious lesion  . COLONOSCOPY WITH PROPOFOL N/A 08/20/2016   Procedure: COLONOSCOPY WITH PROPOFOL;  Surgeon: Midge Minium, MD;  [facility];  Service: Endoscopy;  Laterality: N/A;  . PARTIAL HYSTERECTOMY     one ovary left  . REPAIR ANKLE LIGAMENT     from fall  . ROTATOR CUFF REPAIR Left    shoulder  . TONSILLECTOMY       reports that she has  never smoked. She has never used smokeless tobacco. She reports current alcohol use. She reports that she does not use drugs.  Allergies  Allergen Reactions  . Clavulanic Acid Diarrhea  . Oxycodone Itching  . Tape Rash    Family History  Problem Relation Age of Onset  . Diabetes Paternal Uncle   . Breast cancer Paternal Grandmother 44  . CAD Father      Prior to Admission medications   Medication Sig Start Date End Date Taking? Authorizing Provider  acetaminophen (TYLENOL) 650 MG CR tablet Take 650 mg by mouth 2 (two) times daily.    [provider]  albuterol (PROVENTIL) (2.5 MG/3ML) 0.083% nebulizer solution Take 3 mLs (2.5 mg  total) by nebulization every 6 (six) hours as needed for wheezing or shortness of breath. 01/05/19   Reubin Milan, MD  albuterol (VENTOLIN HFA) 108 (90 Base) MCG/ACT inhaler Inhale 2 puffs into the lungs every 6 (six) hours as needed for wheezing or shortness of breath. 09/17/20   Reubin Milan, MD  cetirizine (ZYRTEC) 10 MG chewable tablet Chew 10 mg by mouth daily.    [provider]  citalopram (CELEXA) 20 MG tablet Take 1 tablet (20 mg total) by mouth daily. 09/17/20   Reubin Milan, MD  cyanocobalamin 500 MCG tablet Take 500 mcg by mouth daily.    [provider]  Melatonin 10 MG CAPS Take 5 mg by mouth every other day.     [provider]  montelukast (SINGULAIR) 10 MG tablet Take 1 tablet (10 mg total) by mouth at bedtime. 03/04/20   Reubin Milan, MD  Multiple Vitamins-Minerals (MULTIVITAMIN ADULTS 50+ PO) Take 1 capsule by mouth daily.    [provider]  rizatriptan (MAXALT-MLT) 10 MG disintegrating tablet Take 1 tablet by mouth at headache onset may repeat once in 2 hours if needed.  No more than 2 tabs in 24 hours 12/07/19   Reubin Milan, MD  tiZANidine (ZANAFLEX) 4 MG tablet Take 1 tablet (4 mg total) by mouth 3 (three) times daily. 12/15/20   Reubin Milan, MD  topiramate (TOPAMAX) 25 MG tablet Take 1 tablet (25 mg total) by mouth daily. 01/13/20   Reubin Milan, MD  Triamcinolone Acetonide (NASACORT ALLERGY 24HR NA) Place into the nose.    [provider]  Vitamin D, Cholecalciferol, 1000 UNITS CAPS Take 1 capsule by mouth daily.     [provider]    Physical Exam: Vitals:   12/19/20 1300 12/19/20 1315 12/19/20 1330 12/19/20 1345  BP: 124/63  132/86   Pulse: 62 66 62 62  Resp: 14 19 15 14   Temp:      TempSrc:      SpO2: 96% 99% 98% 98%  Weight:      Height:         Vitals:   12/19/20 1300 12/19/20 1315 12/19/20 1330 12/19/20 1345  BP: 124/63  132/86   Pulse: 62 66 62 62  Resp: 14 19 15 14    Temp:      TempSrc:      SpO2: 96% 99% 98% 98%  Weight:      Height:        Constitutional: NAD, alert and oriented x 3 Eyes: PERRL, lids and conjunctivae normal ENMT: Mucous membranes are moist.  Neck: normal, supple, no masses, no thyromegaly Respiratory: clear to auscultation bilaterally, no wheezing, no crackles. Normal respiratory effort. No accessory muscle use.  Cardiovascular: Regular rate and  rhythm, no murmurs / rubs / gallops. No extremity edema. 2+ pedal pulses. No carotid bruits.  Abdomen: no tenderness, no masses palpated. No hepatosplenomegaly. Bowel sounds positive.  Musculoskeletal: no clubbing / cyanosis. No joint deformity upper and lower extremities.  Skin: no rashes, lesions, ulcers.  Neurologic: No gross focal neurologic deficit.  Able to move all extremities Psychiatric: Normal mood and affect.   Labs on Admission: I have personally reviewed following labs and imaging studies  CBC: Recent Labs  Lab 12/19/20 0934  WBC 6.2  NEUTROABS 3.4  HGB 14.1  HCT 42.5  MCV 91.0  PLT 267   Basic Metabolic Panel: Recent Labs  Lab 12/19/20 0934  NA 140  K 3.6  CL 104  CO2 26  GLUCOSE 114*  BUN 22*  CREATININE 0.97  CALCIUM 9.0   GFR: Estimated Creatinine Clearance: 63.8 mL/min (by C-G formula based on SCr of 0.97 mg/dL). Liver Function Tests: Recent Labs  Lab 12/19/20 0934  AST 20  ALT 15  ALKPHOS 51  BILITOT 0.9  PROT 7.2  ALBUMIN 4.2   No results for input(s): LIPASE, AMYLASE in the last 168 hours. No results for input(s): AMMONIA in the last 168 hours. Coagulation Profile: Recent Labs  Lab 12/19/20 0934  INR 1.0   Cardiac Enzymes: No results for input(s): CKTOTAL, CKMB, CKMBINDEX, TROPONINI in the last 168 hours. BNP (last 3 results) No results for input(s): PROBNP in the last 8760 hours. HbA1C: No results for input(s): HGBA1C in the last 72 hours. CBG: No results for input(s): GLUCAP in the last 168 hours. Lipid Profile: No  results for input(s): CHOL, HDL, LDLCALC, TRIG, CHOLHDL, LDLDIRECT in the last 72 hours. Thyroid Function Tests: No results for input(s): TSH, T4TOTAL, FREET4, T3FREE, THYROIDAB in the last 72 hours. Anemia Panel: No results for input(s): VITAMINB12, FOLATE, FERRITIN, TIBC, IRON, RETICCTPCT in the last 72 hours. Urine analysis:    Component Value Date/Time   BILIRUBINUR neg 09/17/2020 0852   PROTEINUR Negative 09/17/2020 0852   UROBILINOGEN 0.2 09/17/2020 0852   NITRITE neg 09/17/2020 0852   LEUKOCYTESUR Negative 09/17/2020 0852    Radiological Exams on Admission: CT HEAD WO CONTRAST  Result Date: 12/19/2020 CLINICAL DATA:  Right-sided weakness.  Transient ischemic attack. EXAM: CT HEAD WITHOUT CONTRAST TECHNIQUE: Contiguous axial images were obtained from the base of the skull through the vertex without intravenous contrast. COMPARISON:  None. FINDINGS: Brain: No evidence of acute infarction, hemorrhage, hydrocephalus, extra-axial collection or mass lesion/mass effect. Vascular: No hyperdense vessel or unexpected calcification. Skull: Normal. Negative for fracture or focal lesion. Sinuses/Orbits: No acute finding. Other: None. IMPRESSION: Normal head CT. Electronically Signed   By: Lupita Raider M.D.   On: 12/19/2020 10:01   MR BRAIN WO CONTRAST  Result Date: 12/19/2020 CLINICAL DATA:  TIA.  Right-sided weakness.  Speech abnormality. EXAM: MRI HEAD WITHOUT CONTRAST TECHNIQUE: Multiplanar, multiecho pulse sequences of the brain and surrounding structures were obtained without intravenous contrast. COMPARISON:  CT head 12/19/2020 FINDINGS: Brain: Small diffusion hyperintensity in the left lower pons. This appears restricted and compatible with a small acute infarct. No other acute infarct. Ventricle size and cerebral volume normal. Small chronic infarct left cerebellum. Normal cerebral white matter. Negative for hemorrhage or mass. Vascular: Normal arterial flow voids Skull and upper cervical  spine: Negative Sinuses/Orbits: Negative Other: None IMPRESSION: Small acute infarct left lower pons. Small chronic infarct left cerebellum. Electronically Signed   By: Marlan Palau M.D.   On: 12/19/2020 11:48  [facility] Carotid [facility]ilateral [facility] and [facility])  Result Date: 12/19/2020 CLINICAL DATA:  Cerebral infarction, syncope, hyperlipidemia. EXAM: BILATERAL CAROTID DUPLEX ULTRASOUND TECHNIQUE: Wallace CullensGray scale imaging, color Doppler and duplex ultrasound were performed of bilateral carotid and vertebral arteries in the neck. COMPARISON:  None. FINDINGS: Criteria: Quantification of carotid stenosis is based on velocity parameters that correlate the residual internal carotid diameter with NASCET-based stenosis levels, using the diameter of the distal internal carotid lumen as the denominator for stenosis measurement. The following velocity measurements were obtained: RIGHT ICA:  77/27 cm/sec CCA:  68/23 cm/sec SYSTOLIC ICA/CCA RATIO:  1.1 ECA:  104 cm/sec LEFT ICA:  89/31 cm/sec CCA:  84/18 cm/sec SYSTOLIC ICA/CCA RATIO:  1.1 ECA:  89 cm/sec RIGHT CAROTID ARTERY: There is a mild amount of partially calcified plaque at the level of the carotid bulb likely just extending into the proximal right ICA. Estimated right ICA stenosis is less than 50%. RIGHT VERTEBRAL ARTERY: Antegrade flow with normal waveform and velocity. LEFT CAROTID ARTERY: No plaque at the level of the left internal carotid artery. Mild plaque at the origin of the external carotid artery. LEFT VERTEBRAL ARTERY: Antegrade flow with normal waveform and velocity. IMPRESSION: Mild plaque at the level of the right carotid bulb extending to the right ICA origin. Estimated right ICA stenosis is less than 50%. No evidence of left ICA stenosis. Electronically Signed   By: Irish LackGlenn  Yamagata M.D.   On: 12/19/2020 13:54    EKG: Independently reviewed.  Normal sinus rhythm Assessment/Plan Principal Problem:   Acute CVA (cerebrovascular accident) (HCC) Active  Problems:   Obesity, Class I, BMI 30-34.9   Migraine headache without aura    Acute CVA Patient presents for evaluation of an episode of severe migraine headache associated with right-sided weakness and dysarthria which has resolved MRI of the brain shows an acute infarct involving the left lower pons Place patient on aspirin and high intensity statin Obtain 2D echocardiogram to assess LVEF and rule out cardiac thrombus We will consult neurology We will request PT/OT/speech therapy consult    Obesity (BMI 30.8) Complicates overall prognosis and care   Migraine headache Continue Topamax  Rizatriptan has been discontinued since patient has an acute ischemic stroke   Depression Continue Celexa  DVT prophylaxis: SCD Code Status: Full code Family Communication: Greater than 50% of time was spent discussing plan of care with patient at the bedside.  MRI findings were discussed with him in detail as well.  All questions and concerns have been addressed.  She verbalizes understanding and agrees to the plan. Disposition Plan: Back to previous home environment Consults called: Neurology    Lucile Shuttersochukwu Boyde Grieco MD [facility]     12/19/2020, 2:07 PM

## 2020-12-19 NOTE — ED Provider Notes (Signed)
[facility] Emergency Department Provider Note   ____________________________________________   Event Date/Time   First MD Initiated Contact with Patient 12/19/20 [phone]     (approximate)  I have reviewed the triage vital signs and the nursing notes.   HISTORY  Chief Complaint Weakness    HPI [name] is a 60 y.o. female with a stated past medical history of migraines who presents for right-sided weakness and aphasia that is been intermittent over the last 12 hours.  Patient states that she woke up yesterday morning feeling her normal migraine symptoms and took some naproxen.  Patient worked to the day with worsening migraine symptoms including headache that is worse behind the eyes and is described as 9/10.  At approximately 5 PM yesterday patient took her prescribed her rizatriptan and an hour after that began having episodes that lasted approximately 3-5 minutes of right-sided upper and lower extremity weakness with associated facial droop and         Past Medical History:  Diagnosis Date  . Arthritis    hands, feet, knees  . Asthma   . Migraine   . Mood change    due to menopause/ hot flashes  . PONV (postoperative nausea and vomiting)     Patient Active Problem List   Diagnosis Date Noted  . Acute CVA (cerebrovascular accident) (HCC) 12/19/2020  . Status post total hysterectomy and bilateral salpingo-oophorectomy 09/23/2020  . Generalized osteoarthritis of hand 01/27/2017  . Chronic tension-type headache, not intractable 01/17/2017  . Mood disorder (HCC) 07/13/2016  . Foot pain, left 07/13/2016  . Prediabetes 11/07/2015  . Mixed hyperlipidemia 11/07/2015  . Low serum thyroid stimulating hormone (TSH) 09/30/2015  . Osteoarthritis 09/29/2015  . Menopausal syndrome (hot flashes) 09/29/2015  . Obesity, Class I, BMI 30-34.9 09/26/2015  . Vitamin D deficiency 09/26/2015  . Migraine without status migrainosus, not intractable 09/26/2015   . Allergic asthma, mild intermittent, uncomplicated 09/26/2015  . Colon polyp 09/26/2015  . Synovial cyst of left knee 09/26/2015  . Environmental and seasonal allergies 09/26/2015    Past Surgical History:  Procedure Laterality Date  . ABDOMINAL HYSTERECTOMY    . APPENDECTOMY    . CESAREAN SECTION     x 1  . COLONOSCOPY  2012   due in 2017 for suspicious lesion  . COLONOSCOPY WITH PROPOFOL N/A 08/20/2016   Procedure: COLONOSCOPY WITH PROPOFOL;  Surgeon: Midge Minium, MD;  Location: [facility];  Service: Endoscopy;  Laterality: N/A;  . PARTIAL HYSTERECTOMY     one ovary left  . REPAIR ANKLE LIGAMENT     from fall  . ROTATOR CUFF REPAIR Left    shoulder  . TONSILLECTOMY      Prior to Admission medications   Medication Sig Start Date End Date Taking? Authorizing Provider  acetaminophen (TYLENOL) 650 MG CR tablet Take 650 mg by mouth 2 (two) times daily.    [provider]  albuterol (PROVENTIL) (2.5 MG/3ML) 0.083% nebulizer solution Take 3 mLs (2.5 mg total) by nebulization every 6 (six) hours as needed for wheezing or shortness of breath. 01/05/19   Reubin Milan, MD  albuterol (VENTOLIN HFA) 108 (90 Base) MCG/ACT inhaler Inhale 2 puffs into the lungs every 6 (six) hours as needed for wheezing or shortness of breath. 09/17/20   Reubin Milan, MD  cetirizine (ZYRTEC) 10 MG chewable tablet Chew 10 mg by mouth daily.    [provider]  citalopram (CELEXA) 20 MG tablet Take 1 tablet (20  mg total) by mouth daily. 09/17/20   Reubin Milan, MD  cyanocobalamin 500 MCG tablet Take 500 mcg by mouth daily.    [provider]  Melatonin 10 MG CAPS Take 5 mg by mouth every other day.     [provider]  montelukast (SINGULAIR) 10 MG tablet Take 1 tablet (10 mg total) by mouth at bedtime. 03/04/20   Reubin Milan, MD  Multiple Vitamins-Minerals (MULTIVITAMIN ADULTS 50+ PO) Take 1 capsule by mouth daily.    [provider]   rizatriptan (MAXALT-MLT) 10 MG disintegrating tablet Take 1 tablet by mouth at headache onset may repeat once in 2 hours if needed.  No more than 2 tabs in 24 hours 12/07/19   Reubin Milan, MD  tiZANidine (ZANAFLEX) 4 MG tablet Take 1 tablet (4 mg total) by mouth 3 (three) times daily. 12/15/20   Reubin Milan, MD  topiramate (TOPAMAX) 25 MG tablet Take 1 tablet (25 mg total) by mouth daily. 01/13/20   Reubin Milan, MD  Triamcinolone Acetonide (NASACORT ALLERGY 24HR NA) Place into the nose.    [provider]  Vitamin D, Cholecalciferol, 1000 UNITS CAPS Take 1 capsule by mouth daily.     [provider]    Allergies Clavulanic acid, Oxycodone, and Tape  Family History  Problem Relation Age of Onset  . Diabetes Paternal Uncle   . Breast cancer Paternal Grandmother 13  . CAD Father     Social History Social History   Tobacco Use  . Smoking status: Never Smoker  . Smokeless tobacco: Never Used  Vaping Use  . Vaping Use: Never used  Substance Use Topics  . Alcohol use: Yes    Alcohol/week: 0.0 standard drinks    Comment: 2 drinks - 2x/mo  . Drug use: No    Review of Systems Constitutional: No fever/chills Eyes: No visual changes. ENT: No sore throat. Cardiovascular: Denies chest pain. Respiratory: Denies shortness of breath. Gastrointestinal: No abdominal pain.  No nausea, no vomiting.  No diarrhea. Genitourinary: Negative for dysuria. Musculoskeletal: Negative for acute arthralgias Skin: Negative for rash. Neurological: Endorses headache, right upper and lower extremity weakness, and difficulty speaking, denies numbness/paresthesias in any extremity Psychiatric: Negative for suicidal ideation/homicidal ideation   ____________________________________________   PHYSICAL EXAM:  VITAL SIGNS: ED Triage Vitals  Enc Vitals Group     BP 12/19/20 0921 111/60     Pulse Rate 12/19/20 0920 68     Resp 12/19/20 0919 18     Temp 12/19/20 0920 98  F (36.7 C)     Temp Source 12/19/20 0920 Oral     SpO2 12/19/20 0920 98 %     Weight 12/19/20 0912 179 lb 14.3 oz (81.6 kg)     Height 12/19/20 0912 5\' 4"  (1.626 m)     Head Circumference --      Peak Flow --      Pain Score 12/19/20 0920 3     Pain Loc --      Pain Edu? --      Excl. in GC? --    Constitutional: Alert and oriented. Well appearing and in no acute distress. Eyes: Conjunctivae are normal. PERRL. Head: Atraumatic. Nose: No congestion/rhinnorhea. Mouth/Throat: Mucous membranes are moist. Neck: No stridor Cardiovascular: Grossly normal heart sounds.  Good peripheral circulation. Respiratory: Normal respiratory effort.  No retractions. Gastrointestinal: Soft and nontender. No distention. Musculoskeletal: No obvious deformities Neurologic:  Normal speech and language. No gross focal neurologic deficits  are appreciated. Skin:  Skin is warm and dry. No rash noted. Psychiatric: Mood and affect are normal. Speech and behavior are normal.  ____________________________________________   LABS (all labs ordered are listed, but only abnormal results are displayed)  Labs Reviewed  COMPREHENSIVE METABOLIC PANEL - Abnormal; Notable for the following components:      Result Value   Glucose, Bld 114 (*)    BUN 22 (*)    All other components within normal limits  SARS CORONAVIRUS 2 BY RT PCR (HOSPITAL ORDER, PERFORMED IN [facility])  ETHANOL  PROTIME-INR  APTT  CBC  DIFFERENTIAL  URINE DRUG SCREEN, QUALITATIVE ([facility] ONLY)  URINALYSIS, ROUTINE W REFLEX MICROSCOPIC  HIV ANTIBODY (ROUTINE TESTING W REFLEX)  POC URINE PREG, ED   ____________________________________________  EKG  ED ECG REPORT I, Merwyn KatosEvan K Shrihaan Porzio, the attending physician, personally viewed and interpreted this ECG.  Date: 12/19/2020 EKG Time: 0931 Rate: 63 Rhythm: normal sinus rhythm QRS Axis: normal Intervals: normal ST/T Wave abnormalities: normal Narrative Interpretation: no  evidence of acute ischemia  ____________________________________________  RADIOLOGY  ED MD interpretation: CT of the head without contrast shows no evidence of acute abnormalities including no active bleeding, edema, or obvious masses  MRI of the brain without contrast shows a small acute infarct in the left lower pons as well as a chronic infarct in left cerebellum  Official radiology report(s): CT HEAD WO CONTRAST  Result Date: 12/19/2020 CLINICAL DATA:  Right-sided weakness.  Transient ischemic attack. EXAM: CT HEAD WITHOUT CONTRAST TECHNIQUE: Contiguous axial images were obtained from the base of the skull through the vertex without intravenous contrast. COMPARISON:  None. FINDINGS: Brain: No evidence of acute infarction, hemorrhage, hydrocephalus, extra-axial collection or mass lesion/mass effect. Vascular: No hyperdense vessel or unexpected calcification. Skull: Normal. Negative for fracture or focal lesion. Sinuses/Orbits: No acute finding. Other: None. IMPRESSION: Normal head CT. Electronically Signed   By: Lupita RaiderJames  Green Jr M.D.   On: 12/19/2020 10:01   MR BRAIN WO CONTRAST  Result Date: 12/19/2020 CLINICAL DATA:  TIA.  Right-sided weakness.  Speech abnormality. EXAM: MRI HEAD WITHOUT CONTRAST TECHNIQUE: Multiplanar, multiecho pulse sequences of the brain and surrounding structures were obtained without intravenous contrast. COMPARISON:  CT head 12/19/2020 FINDINGS: Brain: Small diffusion hyperintensity in the left lower pons. This appears restricted and compatible with a small acute infarct. No other acute infarct. Ventricle size and cerebral volume normal. Small chronic infarct left cerebellum. Normal cerebral white matter. Negative for hemorrhage or mass. Vascular: Normal arterial flow voids Skull and upper cervical spine: Negative Sinuses/Orbits: Negative Other: None IMPRESSION: Small acute infarct left lower pons. Small chronic infarct left cerebellum. Electronically Signed   By: Marlan Palauharles   Clark M.D.   On: 12/19/2020 11:48    ____________________________________________   PROCEDURES  Procedure(s) performed (including Critical Care):  .1-3 Lead EKG Interpretation Performed by: Merwyn KatosBradler, Kagan Mutchler K, MD Authorized by: Merwyn KatosBradler, Olando Willems K, MD     Interpretation: normal     ECG rate:  59   ECG rate assessment: normal     Rhythm: sinus rhythm     Ectopy: none     Conduction: normal   .Critical Care Performed by: Merwyn KatosBradler, Hosanna Betley K, MD Authorized by: Merwyn KatosBradler, Kiam Bransfield K, MD   Critical care provider statement:    Critical care time (minutes):  35   Critical care time was exclusive of:  Separately billable procedures and treating other patients   Critical care was necessary to treat or prevent imminent or life-threatening deterioration  of the following conditions:  CNS failure or compromise   Critical care was time spent personally by me on the following activities:  Discussions with consultants, evaluation of patient's response to treatment, examination of patient, ordering and performing treatments and interventions, ordering and review of laboratory studies, ordering and review of radiographic studies, pulse oximetry, re-evaluation of patient's condition, obtaining history from patient or surrogate and review of old charts   I assumed direction of critical care for this patient from another provider in my specialty: no     Care discussed with: admitting provider       ____________________________________________   INITIAL IMPRESSION / ASSESSMENT AND PLAN / ED COURSE  As part of my medical decision making, I reviewed the following data within the electronic MEDICAL RECORD NUMBER Nursing notes reviewed and incorporated, Labs reviewed, EKG interpreted, Old chart reviewed, Radiograph reviewed and Notes from prior ED visits reviewed and incorporated        Patient is a 60 year old female who presents with symptoms concerning for TIA PMH risk factors: Migraines, hypertension Neurologic  Deficits: Right upper and lower extremity weakness and aphasia that has resolved Last known Well Time: 12/18/20 @ 1800 NIH Stroke Score: 0 Given History and Exam I have lower suspicion for infectious etiology, neurologic changes secondary to toxicologic ingestion, seizure, complex migraine. Presentation concerning for possible stroke requiring workup.  Workup: Labs: POC glucose, CBC, BMP, LFTs, Troponin, PT/INR, PTT, Type and Screen Other Diagnostics: ECG, CXR, non-contrast head CT followed by MR without contrast of the brain Interventions: Patient's not eligible for TPA due to resolution of symptoms prior to evaluation  Consult: hospitalist Disposition: Admit      ____________________________________________   FINAL CLINICAL IMPRESSION(S) / ED DIAGNOSES  Final diagnoses:  Weakness of right upper extremity  Weakness of right lower extremity  Difficulty speaking  Cerebrovascular accident (CVA), unspecified mechanism [facility])     ED Discharge Orders    None       Note:  This document was prepared using Dragon voice recognition software and may include unintentional dictation errors.   Merwyn Katos, MD 12/19/20 [phone]

## 2020-12-19 NOTE — ED Triage Notes (Signed)
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

## 2020-12-19 NOTE — Progress Notes (Signed)
SLP Cancellation Note  Patient Details Name: [name] MRN: [id number] DOB: 1961-03-20   Cancelled treatment:       Reason Eval/Treat Not Completed: SLP screened, no needs identified, will sign off (chart reviewed; met w/ pt, husband).  Pt denied any difficulty swallowing and is currently on a regular diet; tolerates swallowing pills w/ water per NSG. Pt conversed at conversational level w/out deficits noted; pt and Husband denied any speech-language deficits stating s/s had resolved. Pt endorsed having migraines that she suffers from and had experienced at the time of the s/s.  No further skilled ST services indicated as pt appears at her baseline. Pt agreed. NSG to reconsult if any change in status.     Orinda Kenner, MS, CCC-SLP Speech Language Pathologist Rehab Services [phone] [facility] 12/19/2020, 5:41 PM

## 2020-12-20 ENCOUNTER — Observation Stay
Admit: 2020-12-20 | Discharge: 2020-12-20 | Disposition: A | Discharge status: Home / Self Care | Payer: Self-pay | Attending: Internal Medicine | Admitting: Internal Medicine

## 2020-12-20 DIAGNOSIS — E782 Mixed hyperlipidemia: Secondary | ICD-10-CM

## 2020-12-20 LAB — LIPID PANEL
Cholesterol: 209 mg/dL — ABNORMAL HIGH (ref 0–200)
HDL: 64 mg/dL (ref >40)
LDL Cholesterol: 132 mg/dL — ABNORMAL HIGH (ref 0–99)
Total CHOL/HDL Ratio: 3.3 RATIO
Triglycerides: 65 mg/dL (ref <150)
VLDL: 13 mg/dL (ref 0–40)

## 2020-12-20 LAB — HEMOGLOBIN A1C
Hgb A1c MFr Bld: 5.4 % (ref 4.8–5.6)
Mean Plasma Glucose: 108.28 mg/dL

## 2020-12-20 MED ORDER — ATORVASTATIN CALCIUM 80 MG PO TABS: 80.0000 mg | ORAL_TABLET | Freq: Every day | ORAL | 3 refills | Status: DC

## 2020-12-20 MED ORDER — CLOPIDOGREL BISULFATE 75 MG PO TABS
75.0000 mg | ORAL_TABLET | Freq: Every day | ORAL | 0 refills | Status: DC
Start: 2020-12-20 — End: 2021-01-05

## 2020-12-20 MED ORDER — ASPIRIN 81 MG PO TBEC: 81.0000 mg | DELAYED_RELEASE_TABLET | Freq: Every day | ORAL | 11 refills | Status: AC

## 2020-12-20 NOTE — ED Notes (Signed)
This RN verified with Dr Frederick Peers that pt is ok for discharge

## 2020-12-20 NOTE — ED Notes (Signed)
Pt provided lunch tray.

## 2020-12-20 NOTE — ED Notes (Signed)
Pt repeatedly coming to nurses desk asking about neurologist discharging pt and echocardiogram being finished to be discharged/  This RN explained that echocardiogram has to be "read and interpreted" by somebody who is qualified then admitting MD will decide if ok for discharge

## 2020-12-20 NOTE — Discharge Instructions (Signed)
Ischemic Stroke An ischemic stroke (cerebrovascular accident, or CVA) is the sudden death of brain tissue that occurs when an area of the brain does not get enough blood flow. This condition is a medical emergency that must be treated right away. An ischemic stroke can cause permanent loss of brain function. Losing brain function can cause problems with how different parts of the body work. What are the causes? This condition is caused by a decrease of blood flow to an area of the brain, which may be the result of: A small blood clot (embolus) or a buildup of plaque in the blood vessels, called atherosclerosis, that blocks blood flow in the brain. An abnormal heart rhythm called atrial fibrillation, which sends a small blood clot to the brain. A blocked or damaged artery in the head or neck. Certain infections. Inflammation of the arteries in the brain (vasculitis). Sometimes, the cause of ischemic stroke is not known. What increases the risk? The following factors may make you more likely to develop this condition: Factors that you can change High blood pressure (hypertension) or certain other medical conditions, such as: Heart disease. Diabetes mellitus. High cholesterol. Obesity. Sleep apnea. Migraine headache. Smoking cigarettes or using other tobacco products. Physical inactivity. Heavy alcohol use. Use of illegal drugs, especially cocaine and methamphetamine. Taking birth control pills, especially if you also use tobacco. Factors that you cannot change Being older than age 60. History of blood clots, stroke, or mini-stroke (transient ischemic attack, TIA). History of high blood pressure when pregnant (preeclampsia), in women. Family history of stroke. Sickle cell disease. Blood clotting disorders (hypercoagulable state). What are the signs or symptoms? Symptoms of this condition usually develop suddenly, or you may notice them after waking from sleep. These sudden symptoms may  include: Weakness or numbness of your face, arm, or leg, especially on one side of your body. Loss of balance or coordination. Slurred speech, or aphasia. Aphasia is trouble speaking or trouble understanding speech or both. Vision changes in one or both eyes. You may have double vision, blurred vision, or loss of vision. Dizziness or confusion. Nausea and vomiting. Severe headache with no known cause. If possible, write down the exact time that you last felt like your normal self and what time your symptoms started. Tell your health care provider. If symptoms come and go, they could be signs of a TIA (transient ischemic attack). Get help right away, even if you feel better. How is this diagnosed? This condition may be diagnosed based on: Your symptoms, your medical history, and a physical exam. CT scan of the brain. MRI. Imaging tests that scan blood flow (circulation) in the brain. These may be CT angiogram, MRI angiogram, or cerebral angiogram. You may need to see a health care provider who specializes in stroke care. A stroke specialist can be seen in person or through communication using telephone or television technology (telemedicine). You may also have other tests, including: Electrocardiogram (ECG). Continuous heart monitoring. Transthoracic echocardiogram (TTE). Transesophageal echocardiogram (TEE). Carotid ultrasound. Blood tests. Sleep study to check for sleep apnea. How is this treated? Treatment for this condition depends on the duration, severity, and cause of your symptoms and on the area of the brain affected. It is very important to get treatment at the first sign of stroke symptoms. Some treatments work better if they are done within 3-6 hours of the start of stroke symptoms. These initial treatments may include: Thrombolytic medicine that is injected to dissolve the blood clot. Treatments given directly   to the affected artery to remove or dissolve the blood  clot. Medicines to control blood pressure. Anticoagulant or antiplatelet medicines to thin the blood. Other treatments may include: Oxygen. IV fluids. Procedures to increase blood flow. Medicines and diet changes may be used to help manage risk factors for stroke, such as diabetes, high cholesterol, and high blood pressure. After a stroke, you may work with physical, speech, mental health, or occupational therapists to help you recover. Follow these instructions at home: Medicines Take over-the-counter and prescription medicines only as told by your health care provider. If you were told to take a medicine to thin your blood, take your medicine exactly as told, at the same time every day. This includes aspirin or an anticoagulant. Taking too much blood-thinning medicine can cause bleeding. If you do not take enough blood-thinning medicine, you will not be protected enough against another stroke and other problems. Understand the side effects of taking anticoagulant medicine. When taking this type of medicine, make sure you: Hold pressure over any cuts for longer than usual. Tell your dentist and other health care providers that you are taking anticoagulants before you have any procedures that may cause bleeding. Avoid activities that could cause injury or bruising. Wear a medical alert bracelet or carry a card that lists what medicines you take. Eating and drinking Follow instructions from your health care provider about diet. Eat healthy foods. If your stroke affected your ability to swallow, you may need to take steps to avoid choking. These may include taking small bites when eating and eating foods that are soft or pureed. Safety Follow instructions from your health care team about physical activity. Use a walker or cane as told by your health care provider. Take steps to create a safe home environment to lower your risk of falls. These steps may include: Having your home looked at by  specialists. Installing grab bars in the bedroom and bathroom. Using safety equipment, such as raised toilets and a seat in the shower. General instructions Do not use any products that contain nicotine or tobacco, such as cigarettes, e-cigarettes, and chewing tobacco. If you need help quitting, ask your health care provider. If you drink alcohol: Limit how much you use to: 0-1 drink a day for women. 0-2 drinks a day for men. Be aware of how much alcohol is in your drink. In the [place] one drink equals one 12 oz bottle of beer (355 mL), one 5 oz glass of wine (148 mL), or one 1 oz glass of hard liquor (44 mL). If you need help to stop using drugs or alcohol, ask your health care provider about a referral to a program or specialist. Maintain an active and healthy lifestyle. Get regular exercise as told. Wear a medical bracelet as told by your health care provider. Keep all follow-up visits as told by your health care provider, including visits with all specialists on your health care team. This is important. How is this prevented? You can lower your risk of another stroke by managing high blood pressure, high cholesterol, diabetes, heart disease, sleep apnea, and obesity. Your risk can also be lowered by quitting smoking, limiting alcohol, and staying physically active. Your health care provider will continue to help you with ways to prevent short-term and long-term problems caused by stroke. Get help right away if: You have any symptoms of a stroke. "BE FAST" is an easy way to remember the main warning signs of a stroke: B - Balance. Signs are dizziness,   sudden trouble walking, or loss of balance. E - Eyes. Signs are trouble seeing or a sudden change in vision. F - Face. Signs are sudden weakness or numbness of the face, or the face or eyelid drooping on one side. A - Arms. Signs are weakness or numbness in an arm. This happens suddenly and usually on one side of the body. S - Speech. Signs  are sudden trouble speaking, slurred speech, or trouble understanding what people say. T - Time. Time to call emergency services. Write down what time symptoms started. You have other signs of a stroke, such as: A sudden, severe headache with no known cause. Nausea or vomiting. Seizure. These symptoms may represent a serious problem that is an emergency. Do not wait to see if the symptoms will go away. Get medical help right away. Call your local emergency services (911 in the [place] Do not drive yourself to the hospital. Summary An ischemic stroke (cerebrovascular accident, or CVA) is the sudden death of brain tissue that occurs when an area of the brain does not get enough blood flow. Symptoms of this condition usually develop suddenly, or you may notice them after waking from sleep. It is very important to get treatment at the first sign of stroke symptoms. Stroke is a medical emergency that must be treated right away. This information is not intended to replace advice given to you by your health care provider. Make sure you discuss any questions you have with your health care provider. Document Revised: 11/12/2019 Document Reviewed: 11/12/2019 Elsevier Patient Education  2021 [employer]  

## 2020-12-20 NOTE — ED Notes (Signed)
Pt oob to hallway bathroom - ambulates independently with steady gait- no neuro deficits noted.  RR even and unlabored on RA.  Pt now back in bed on continuous cardiac and pulse ox monitoring; denies any immediate needs, questions, concerns.  Will monitor for acute changes and maintain plan of care.

## 2020-12-20 NOTE — ED Notes (Signed)
Pt ambulatory to restroom, steady gait, NAD noted 

## 2020-12-20 NOTE — ED Notes (Signed)
Pt a&ox4 - GCS 15.  Speech clear.  No facial asymmetry noted.  Pt denies weakness and paraesthesia in extremities.  Pt reports she has ambulated without difficulty since arrival to ED.  RR even and unlabored on RA with symmetrical rise and fall of chest.  Continuous pulse ox and cardiac monitoring maintained; VSS.  Abdomen soft, nontender.  Skin warm dry and intact.  Pt provided water per request; denies any additional questions concerns needs.  Will monitor for acute changes and maintain plan of care.

## 2020-12-20 NOTE — ED Notes (Signed)
Pt eating breakfast at this time, alert and oriented, clear speech, NAD noted

## 2020-12-20 NOTE — ED Notes (Signed)
Dr Frederick Peers notified via secure chat of pt asking about discharge today, states will be to bedside to discuss with pt when able

## 2020-12-21 LAB — ECHOCARDIOGRAM COMPLETE
Area-P 1/2: 3.58 cm2
Height: 64 in
S' Lateral: 2.85 cm
Weight: 2878.33 oz

## 2020-12-21 NOTE — Discharge Summary (Signed)
Physician Discharge Summary   [name] TIW:[id number] DOB: [birth year] DOA: 12/19/2020  PCP: Reubin Milan, MD  Admit date: 12/19/2020 Discharge date: 12/21/2020  Admitted From: Home Disposition: Home Discharging physician: Lewie Chamber, MD  Recommendations for Outpatient Follow-up:  1. Follow-up with neurology especially to discuss ongoing migraine management 2.   MRA head showed "1-2 mm inferiorly projecting vascular protrusion arising from the paraclinoid right ICA which may reflect an infundibulum or small aneurysm"   Patient discharged to home in Discharge Condition: stable CODE STATUS: Full Diet recommendation:  Diet Orders (From admission, onward)    Start     Ordered   12/20/20 0000  Diet - low sodium heart healthy        12/20/20 1542          Hospital Course:  [name] is a 60 year old female with PMH hyperlipidemia, migraines who presented to the ER with nonresolving right-sided weakness and dysarthria.  Her symptoms had started the day prior to admission and she went to sleep with her symptoms; due to still being present upon awakening, she had presented to the ER.  She had thought it was related to her migraine which she had been recently having and had taken some of her rizatriptan at home. She was admitted for stroke work-up.  MRI brain revealed small acute infarct in the left lower pons; also showed small chronic infarct involving left cerebellum. Carotid ultrasound was negative for significant stenosis. Echo was unremarkable, EF 60 to 65%.  No obvious thrombi appreciated. MRA head negative for LVO. Did show "1-2 mm inferiorly projecting vascular protrusion arising from the paraclinoid right ICA which may reflect an infundibulum or small aneurysm"  She was evaluated by neurology as well.  Recommendation was for aspirin and Plavix for 21 days and then monotherapy aspirin thereafter.  Recommendations were instructed to patient prior to discharge and she  also voiced understanding. LDL was also elevated and she was started on Lipitor prior to discharge.   The patient's chronic medical conditions were treated accordingly per the patient's home medication regimen except as noted.  On day of discharge, patient was felt deemed stable for discharge. Patient/family member advised to call PCP or come back to ER if needed.   Principal Diagnosis: Acute CVA (cerebrovascular accident) [facility])  Discharge Diagnoses: Active Hospital Problems   Diagnosis Date Noted  . Acute CVA (cerebrovascular accident) (HCC) 12/19/2020    Priority: High  . Mixed hyperlipidemia 11/07/2015  . Migraine headache without aura 09/26/2015  . Obesity, Class I, BMI 30-34.9 09/26/2015    Resolved Hospital Problems  No resolved problems to display.    Discharge Instructions    Diet - low sodium heart healthy   Complete by: As directed    Increase activity slowly   Complete by: As directed      Allergies as of 12/20/2020      Reactions   Clavulanic Acid Diarrhea   Oxycodone Itching   Tape Rash      Medication List    STOP taking these medications   rizatriptan 10 MG disintegrating tablet Commonly known as: MAXALT-MLT     TAKE these medications   acetaminophen 650 MG CR tablet Commonly known as: TYLENOL Take 650 mg by mouth 2 (two) times daily as needed for pain.   albuterol (2.5 MG/3ML) 0.083% nebulizer solution Commonly known as: PROVENTIL Take 3 mLs (2.5 mg total) by nebulization every 6 (six) hours as needed for wheezing or shortness of breath.   albuterol  108 (90 Base) MCG/ACT inhaler Commonly known as: VENTOLIN HFA Inhale 2 puffs into the lungs every 6 (six) hours as needed for wheezing or shortness of breath.   aspirin 81 MG EC tablet Take 1 tablet (81 mg total) by mouth daily. Swallow whole.   atorvastatin 80 MG tablet Commonly known as: LIPITOR Take 1 tablet (80 mg total) by mouth daily.   cetirizine 10 MG chewable tablet Commonly known as:  ZYRTEC Chew 10 mg by mouth daily as needed for allergies.   citalopram 20 MG tablet Commonly known as: CELEXA Take 1 tablet (20 mg total) by mouth daily.   clopidogrel 75 MG tablet Commonly known as: PLAVIX Take 1 tablet (75 mg total) by mouth daily for 21 days.   melatonin 5 MG Tabs Take 5-10 mg by mouth at bedtime as needed (sleep).   montelukast 10 MG tablet Commonly known as: SINGULAIR Take 1 tablet (10 mg total) by mouth at bedtime.   MULTIVITAMIN ADULTS 50+ PO Take 1 capsule by mouth daily.   NASACORT ALLERGY 24HR NA Place 1-2 sprays into both nostrils daily as needed (allergies).   tiZANidine 4 MG tablet Commonly known as: ZANAFLEX Take 1 tablet (4 mg total) by mouth 3 (three) times daily.   topiramate 25 MG tablet Commonly known as: TOPAMAX Take 1 tablet (25 mg total) by mouth daily.   vitamin B-12 500 MCG tablet Commonly known as: CYANOCOBALAMIN Take 500 mcg by mouth daily.   Vitamin D (Cholecalciferol) 25 MCG (1000 UT) Caps Take 1,000 Units by mouth daily.       Allergies  Allergen Reactions  . Clavulanic Acid Diarrhea  . Oxycodone Itching  . Tape Rash    Consultations: Neuro  Discharge Exam: BP (!) 153/84 (BP Location: Right Arm)   Pulse 77   Temp 98.1 F (36.7 C) (Oral)   Resp 18   Ht 5\' 4"  (1.626 m)   Wt 81.6 kg   SpO2 96%   BMI 30.88 kg/m  General appearance: alert, cooperative and no distress Head: Normocephalic, without obvious abnormality, atraumatic Eyes: EOMI Lungs: clear to auscultation bilaterally Heart: regular rate and rhythm and S1, S2 normal Abdomen: normal findings: bowel sounds normal and soft, non-tender Extremities: no edema Skin: mobility and turgor normal Neurologic: Grossly normal  The results of significant diagnostics from this hospitalization (including imaging, microbiology, ancillary and laboratory) are listed below for reference.   Microbiology: Recent Results (from the past 240 hour(s))  SARS  Coronavirus 2 by RT PCR (hospital order, performed in [facility] hospital lab) Nasopharyngeal Nasopharyngeal Swab     Status: None   Collection Time: 12/19/20  9:35 AM   Specimen: Nasopharyngeal Swab  Result Value Ref Range Status   SARS Coronavirus 2 NEGATIVE NEGATIVE Final    Comment: (NOTE) SARS-CoV-2 target nucleic acids are NOT DETECTED.  The SARS-CoV-2 RNA is generally detectable in upper and lower respiratory specimens during the acute phase of infection. The lowest concentration of SARS-CoV-2 viral copies this assay can detect is 250 copies / mL. A negative result does not preclude SARS-CoV-2 infection and should not be used as the sole basis for treatment or other patient management decisions.  A negative result may occur with improper specimen collection / handling, submission of specimen other than nasopharyngeal swab, presence of viral mutation(s) within the areas targeted by this assay, and inadequate number of viral copies (<250 copies / mL). A negative result must be combined with clinical observations, patient history, and epidemiological information.  Fact  Sheet for Patients:   BoilerBrush.com.cy  Fact Sheet for Healthcare Providers: https://pope.com/  This test is not yet approved or  cleared by the [place] FDA and has been authorized for detection and/or diagnosis of SARS-CoV-2 by FDA under an Emergency Use Authorization (EUA).  This EUA will remain in effect (meaning this test can be used) for the duration of the COVID-19 declaration under Section 564(b)(1) of the Act, 21 U.S.C. section 360bbb-3(b)(1), unless the authorization is terminated or revoked sooner.  Performed at [facility], [address] [place], [place] [address]      Labs: BNP (last 3 results) No results for input(s): BNP in the last 8760 hours. Basic Metabolic Panel: Recent Labs  Lab 12/19/20 0934  NA 140  K 3.6  CL  104  CO2 26  GLUCOSE 114*  BUN 22*  CREATININE 0.97  CALCIUM 9.0   Liver Function Tests: Recent Labs  Lab 12/19/20 0934  AST 20  ALT 15  ALKPHOS 51  BILITOT 0.9  PROT 7.2  ALBUMIN 4.2   No results for input(s): LIPASE, AMYLASE in the last 168 hours. No results for input(s): AMMONIA in the last 168 hours. CBC: Recent Labs  Lab 12/19/20 0934  WBC 6.2  NEUTROABS 3.4  HGB 14.1  HCT 42.5  MCV 91.0  PLT 267   Cardiac Enzymes: No results for input(s): CKTOTAL, CKMB, CKMBINDEX, TROPONINI in the last 168 hours. BNP: Invalid input(s): POCBNP CBG: No results for input(s): GLUCAP in the last 168 hours. D-Dimer No results for input(s): DDIMER in the last 72 hours. Hgb A1c Recent Labs    12/20/20 0432  HGBA1C 5.4   Lipid Profile Recent Labs    12/20/20 0432  CHOL 209*  HDL 64  LDLCALC 132*  TRIG 65  CHOLHDL 3.3   Thyroid function studies No results for input(s): TSH, T4TOTAL, T3FREE, THYROIDAB in the last 72 hours.  Invalid input(s): FREET3 Anemia work up No results for input(s): VITAMINB12, FOLATE, FERRITIN, TIBC, IRON, RETICCTPCT in the last 72 hours. Urinalysis    Component Value Date/Time   COLORURINE YELLOW (A) 12/19/2020 0926   APPEARANCEUR HAZY (A) 12/19/2020 0926   LABSPEC 1.012 12/19/2020 0926   PHURINE 7.0 12/19/2020 0926   GLUCOSEU NEGATIVE 12/19/2020 0926   HGBUR NEGATIVE 12/19/2020 0926   BILIRUBINUR NEGATIVE 12/19/2020 0926   BILIRUBINUR neg 09/17/2020 0852   KETONESUR NEGATIVE 12/19/2020 0926   PROTEINUR NEGATIVE 12/19/2020 0926   UROBILINOGEN 0.2 09/17/2020 0852   NITRITE NEGATIVE 12/19/2020 0926   LEUKOCYTESUR NEGATIVE 12/19/2020 0926   Sepsis Labs Invalid input(s): PROCALCITONIN,  WBC,  LACTICIDVEN Microbiology Recent Results (from the past 240 hour(s))  SARS Coronavirus 2 by RT PCR (hospital order, performed in [facility] hospital lab) Nasopharyngeal Nasopharyngeal Swab     Status: None   Collection Time: 12/19/20  9:35 AM    Specimen: Nasopharyngeal Swab  Result Value Ref Range Status   SARS Coronavirus 2 NEGATIVE NEGATIVE Final    Comment: (NOTE) SARS-CoV-2 target nucleic acids are NOT DETECTED.  The SARS-CoV-2 RNA is generally detectable in upper and lower respiratory specimens during the acute phase of infection. The lowest concentration of SARS-CoV-2 viral copies this assay can detect is 250 copies / mL. A negative result does not preclude SARS-CoV-2 infection and should not be used as the sole basis for treatment or other patient management decisions.  A negative result may occur with improper specimen collection / handling, submission of specimen other than nasopharyngeal swab, presence of viral  mutation(s) within the areas targeted by this assay, and inadequate number of viral copies (<250 copies / mL). A negative result must be combined with clinical observations, patient history, and epidemiological information.  Fact Sheet for Patients:   BoilerBrush.com.cyhttps://www.fda.gov/media/136312/download  Fact Sheet for Healthcare Providers: https://pope.com/https://www.fda.gov/media/136313/download  This test is not yet approved or  cleared by the [place] FDA and has been authorized for detection and/or diagnosis of SARS-CoV-2 by FDA under an Emergency Use Authorization (EUA).  This EUA will remain in effect (meaning this test can be used) for the duration of the COVID-19 declaration under Section 564(b)(1) of the Act, 21 U.S.C. section 360bbb-3(b)(1), unless the authorization is terminated or revoked sooner.  Performed at [facility], [address] [place], [place] [id number]     Procedures/Studies: CT HEAD WO CONTRAST  Result Date: 12/19/2020 CLINICAL DATA:  Right-sided weakness.  Transient ischemic attack. EXAM: CT HEAD WITHOUT CONTRAST TECHNIQUE: Contiguous axial images were obtained from the base of the skull through the vertex without intravenous contrast. COMPARISON:  None. FINDINGS: Brain: No evidence  of acute infarction, hemorrhage, hydrocephalus, extra-axial collection or mass lesion/mass effect. Vascular: No hyperdense vessel or unexpected calcification. Skull: Normal. Negative for fracture or focal lesion. Sinuses/Orbits: No acute finding. Other: None. IMPRESSION: Normal head CT. Electronically Signed   By: Lupita RaiderJames  Green Jr M.D.   On: 12/19/2020 10:01   MR ANGIO HEAD WO CONTRAST  Result Date: 12/19/2020 CLINICAL DATA:  Neuro deficit, acute, stroke suspected. EXAM: MRA HEAD WITHOUT CONTRAST TECHNIQUE: Angiographic images of the Circle of Willis were obtained using MRA technique without intravenous contrast. COMPARISON:  Brain MRI 12/19/2020.  Head CT 12/19/2020. FINDINGS: The intracranial internal carotid arteries are patent. The M1 middle cerebral arteries are patent. No M2 proximal branch occlusion or high-grade proximal stenosis is identified. The anterior cerebral arteries are patent. 1-2 mm inferiorly projecting vascular protrusion arising from the paraclinoid right ICA which may reflect an infundibulum or small aneurysm (series 1056, image 15). Non dominant intracranial right vertebral artery which terminates predominantly as the right PICA. The dominant intracranial left vertebral artery is patent without stenosis, as is the basilar artery. The posterior cerebral arteries are patent bilaterally. A left posterior communicating artery is present. The right posterior communicating artery is hypoplastic or absent. There is an apparent 1-2 mm vascular protrusion projecting rightward from the proximal basilar artery on the MIP images. However, a diminutive vertebral artery can be seen extending to this point on the source images and this does not reflect an aneurysm. IMPRESSION: No intracranial large vessel occlusion or proximal high-grade arterial stenosis. 1-2 mm inferiorly projecting vascular protrusion arising from the paraclinoid right ICA which may reflect an infundibulum or small aneurysm.  Electronically Signed   By: Jackey LogeKyle  Golden DO   On: 12/19/2020 15:55   MR BRAIN WO CONTRAST  Result Date: 12/19/2020 CLINICAL DATA:  TIA.  Right-sided weakness.  Speech abnormality. EXAM: MRI HEAD WITHOUT CONTRAST TECHNIQUE: Multiplanar, multiecho pulse sequences of the brain and surrounding structures were obtained without intravenous contrast. COMPARISON:  CT head 12/19/2020 FINDINGS: Brain: Small diffusion hyperintensity in the left lower pons. This appears restricted and compatible with a small acute infarct. No other acute infarct. Ventricle size and cerebral volume normal. Small chronic infarct left cerebellum. Normal cerebral white matter. Negative for hemorrhage or mass. Vascular: Normal arterial flow voids Skull and upper cervical spine: Negative Sinuses/Orbits: Negative Other: None IMPRESSION: Small acute infarct left lower pons. Small chronic infarct left cerebellum. Electronically Signed  By: Marlan Palau M.D.   On: 12/19/2020 11:48   US Carotid Bilateral (at [facility] and [facility] only)  Result Date: 12/19/2020 CLINICAL DATA:  Cerebral infarction, syncope, hyperlipidemia. EXAM: BILATERAL CAROTID DUPLEX ULTRASOUND TECHNIQUE: Wallace Cullens scale imaging, color Doppler and duplex ultrasound were performed of bilateral carotid and vertebral arteries in the neck. COMPARISON:  None. FINDINGS: Criteria: Quantification of carotid stenosis is based on velocity parameters that correlate the residual internal carotid diameter with NASCET-based stenosis levels, using the diameter of the distal internal carotid lumen as the denominator for stenosis measurement. The following velocity measurements were obtained: RIGHT ICA:  77/27 cm/sec CCA:  68/23 cm/sec SYSTOLIC ICA/CCA RATIO:  1.1 ECA:  104 cm/sec LEFT ICA:  89/31 cm/sec CCA:  84/18 cm/sec SYSTOLIC ICA/CCA RATIO:  1.1 ECA:  89 cm/sec RIGHT CAROTID ARTERY: There is a mild amount of partially calcified plaque at the level of the carotid bulb likely just extending into the  proximal right ICA. Estimated right ICA stenosis is less than 50%. RIGHT VERTEBRAL ARTERY: Antegrade flow with normal waveform and velocity. LEFT CAROTID ARTERY: No plaque at the level of the left internal carotid artery. Mild plaque at the origin of the external carotid artery. LEFT VERTEBRAL ARTERY: Antegrade flow with normal waveform and velocity. IMPRESSION: Mild plaque at the level of the right carotid bulb extending to the right ICA origin. Estimated right ICA stenosis is less than 50%. No evidence of left ICA stenosis. Electronically Signed   By: Irish Lack M.D.   On: 12/19/2020 13:54   ECHOCARDIOGRAM COMPLETE  Result Date: 12/21/2020    ECHOCARDIOGRAM REPORT   Patient Name:   [name] Date of Exam: 12/20/2020 Medical Rec #:  [id number]       Height:       64.0 in Accession #:    [id number]      Weight:       179.9 lb Date of Birth:  1961/05/03        BSA:          1.870 m Patient Age:    59 years        BP:           163/72 mmHg Patient Gender: F               HR:           71 bpm. Exam Location:  [facility] Procedure: 2D Echo Indications:     Stroke 434.91/I163.9  History:         Patient has no prior history of Echocardiogram examinations.                  Risk Factors:Dyslipidemia.  Sonographer:     Johnathan Hausen Referring Phys:  [id number] AGBATA Diagnosing Phys: Arnoldo Hooker MD IMPRESSIONS  1. Left ventricular ejection fraction, by estimation, is 60 to 65%. The left ventricle has normal function. The left ventricle has no regional wall motion abnormalities. Left ventricular diastolic parameters were normal.  2. Right ventricular systolic function is normal. The right ventricular size is normal.  3. The mitral valve is normal in structure. Trivial mitral valve regurgitation.  4. The aortic valve is normal in structure. Aortic valve regurgitation is trivial. FINDINGS  Left Ventricle: Left ventricular ejection fraction, by estimation, is 60 to 65%. The left ventricle has normal function. The  left ventricle has no regional wall motion abnormalities. The left ventricular internal cavity size was normal in size. There is  no left ventricular  hypertrophy. Left ventricular diastolic parameters were normal. Right Ventricle: The right ventricular size is normal. No increase in right ventricular wall thickness. Right ventricular systolic function is normal. Left Atrium: Left atrial size was normal in size. Right Atrium: Right atrial size was normal in size. Pericardium: There is no evidence of pericardial effusion. Mitral Valve: The mitral valve is normal in structure. Trivial mitral valve regurgitation. Tricuspid Valve: The tricuspid valve is normal in structure. Tricuspid valve regurgitation is trivial. Aortic Valve: The aortic valve is normal in structure. Aortic valve regurgitation is trivial. Pulmonic Valve: The pulmonic valve was normal in structure. Pulmonic valve regurgitation is not visualized. Aorta: The aortic root and ascending aorta are structurally normal, with no evidence of dilitation. IAS/Shunts: No atrial level shunt detected by color flow Doppler.  LEFT VENTRICLE PLAX 2D LVIDd:         4.26 cm  Diastology LVIDs:         2.85 cm  LV e' medial:    11.00 cm/s LV PW:         1.20 cm  LV E/e' medial:  7.0 LV IVS:        0.92 cm  LV e' lateral:   8.81 cm/s LVOT diam:     2.10 cm  LV E/e' lateral: 8.8 LVOT Area:     3.46 cm  RIGHT VENTRICLE             IVC RV S prime:     17.80 cm/s  IVC diam: 1.43 cm TAPSE (M-mode): 2.8 cm LEFT ATRIUM             Index       RIGHT ATRIUM           Index LA diam:        3.90 cm 2.09 cm/m  RA Area:     13.10 cm LA Vol (A2C):   50.0 ml 26.73 ml/m RA Volume:   28.50 ml  15.24 ml/m LA Vol (A4C):   32.0 ml 17.11 ml/m LA Biplane Vol: 42.5 ml 22.72 ml/m   AORTA Ao Root diam: 3.60 cm MITRAL VALVE MV Area (PHT): 3.58 cm    SHUNTS MV Decel Time: 212 msec    Systemic Diam: 2.10 cm MV E velocity: 77.10 cm/s MV A velocity: 93.50 cm/s MV E/A ratio:  0.82 Arnoldo HookerBruce Kowalski MD  Electronically signed by Arnoldo HookerBruce Kowalski MD Signature Date/Time: 12/21/2020/7:24:12 AM    Final      Time coordinating discharge: Over 30 minutes    Lewie Chamberavid Braun Rocca, MD  [facility] 12/21/2020, 3:37 PM

## 2020-12-22 ENCOUNTER — Encounter (INDEPENDENT_AMBULATORY_CARE_PROVIDER_SITE_OTHER): Payer: Self-pay

## 2020-12-23 ENCOUNTER — Emergency Department: Payer: Self-pay

## 2020-12-23 ENCOUNTER — Inpatient Hospital Stay: Payer: Self-pay

## 2020-12-23 ENCOUNTER — Other Ambulatory Visit: Payer: Self-pay

## 2020-12-23 ENCOUNTER — Inpatient Hospital Stay
Admission: EM | Admit: 2020-12-23 | Discharge: 2020-12-26 | DRG: 065 | Disposition: A | Discharge status: Rehabilitation Facility | Payer: Self-pay | Attending: Internal Medicine | Admitting: Internal Medicine

## 2020-12-23 ENCOUNTER — Telehealth: Payer: Self-pay | Admitting: Internal Medicine

## 2020-12-23 DIAGNOSIS — R29810 Facial weakness: Secondary | ICD-10-CM | POA: Diagnosis present

## 2020-12-23 DIAGNOSIS — Z7982 Long term (current) use of aspirin: Secondary | ICD-10-CM

## 2020-12-23 DIAGNOSIS — Z79899 Other long term (current) drug therapy: Secondary | ICD-10-CM

## 2020-12-23 DIAGNOSIS — J45909 Unspecified asthma, uncomplicated: Secondary | ICD-10-CM | POA: Diagnosis present

## 2020-12-23 DIAGNOSIS — Z8669 Personal history of other diseases of the nervous system and sense organs: Secondary | ICD-10-CM

## 2020-12-23 DIAGNOSIS — R471 Dysarthria and anarthria: Secondary | ICD-10-CM | POA: Diagnosis present

## 2020-12-23 DIAGNOSIS — G8191 Hemiplegia, unspecified affecting right dominant side: Secondary | ICD-10-CM | POA: Diagnosis present

## 2020-12-23 DIAGNOSIS — R531 Weakness: Secondary | ICD-10-CM

## 2020-12-23 DIAGNOSIS — F32A Depression, unspecified: Secondary | ICD-10-CM | POA: Diagnosis present

## 2020-12-23 DIAGNOSIS — Z888 Allergy status to other drugs, medicaments and biological substances status: Secondary | ICD-10-CM

## 2020-12-23 DIAGNOSIS — G43909 Migraine, unspecified, not intractable, without status migrainosus: Secondary | ICD-10-CM | POA: Diagnosis present

## 2020-12-23 DIAGNOSIS — I6329 Cerebral infarction due to unspecified occlusion or stenosis of other precerebral arteries: Principal | ICD-10-CM | POA: Diagnosis present

## 2020-12-23 DIAGNOSIS — Z91048 Other nonmedicinal substance allergy status: Secondary | ICD-10-CM

## 2020-12-23 DIAGNOSIS — Z885 Allergy status to narcotic agent status: Secondary | ICD-10-CM

## 2020-12-23 DIAGNOSIS — E66811 Obesity, class 1: Secondary | ICD-10-CM | POA: Diagnosis present

## 2020-12-23 DIAGNOSIS — Z6830 Body mass index (BMI) 30.0-30.9, adult: Secondary | ICD-10-CM

## 2020-12-23 DIAGNOSIS — Z8673 Personal history of transient ischemic attack (TIA), and cerebral infarction without residual deficits: Secondary | ICD-10-CM

## 2020-12-23 DIAGNOSIS — R29705 NIHSS score 5: Secondary | ICD-10-CM | POA: Diagnosis present

## 2020-12-23 DIAGNOSIS — I639 Cerebral infarction, unspecified: Secondary | ICD-10-CM | POA: Diagnosis present

## 2020-12-23 DIAGNOSIS — Z7902 Long term (current) use of antithrombotics/antiplatelets: Secondary | ICD-10-CM

## 2020-12-23 DIAGNOSIS — E669 Obesity, unspecified: Secondary | ICD-10-CM | POA: Diagnosis present

## 2020-12-23 DIAGNOSIS — E785 Hyperlipidemia, unspecified: Secondary | ICD-10-CM | POA: Diagnosis present

## 2020-12-23 DIAGNOSIS — Z20822 Contact with and (suspected) exposure to covid-19: Secondary | ICD-10-CM | POA: Diagnosis present

## 2020-12-23 LAB — DIFFERENTIAL
Abs Immature Granulocytes: 0.02 10*3/uL (ref 0.00–0.07)
Basophils Absolute: 0 10*3/uL (ref 0.0–0.1)
Basophils Relative: 0 %
Eosinophils Absolute: 0.2 10*3/uL (ref 0.0–0.5)
Eosinophils Relative: 2 %
Immature Granulocytes: 0 %
Lymphocytes Relative: 37 %
Lymphs Abs: 2.7 10*3/uL (ref 0.7–4.0)
Monocytes Absolute: 0.4 10*3/uL (ref 0.1–1.0)
Monocytes Relative: 5 %
Neutro Abs: 4 10*3/uL (ref 1.7–7.7)
Neutrophils Relative %: 56 %

## 2020-12-23 LAB — CBC
HCT: 40.7 % (ref 36.0–46.0)
Hemoglobin: 13.7 g/dL (ref 12.0–15.0)
MCH: 30 pg (ref 26.0–34.0)
MCHC: 33.7 g/dL (ref 30.0–36.0)
MCV: 89.1 fL (ref 80.0–100.0)
Platelets: 275 10*3/uL (ref 150–400)
RBC: 4.57 MIL/uL (ref 3.87–5.11)
RDW: 13 % (ref 11.5–15.5)
WBC: 7.2 10*3/uL (ref 4.0–10.5)
nRBC: 0 % (ref 0.0–0.2)

## 2020-12-23 LAB — PROTIME-INR
INR: 1 (ref 0.8–1.2)
Prothrombin Time: 12.4 seconds (ref 11.4–15.2)

## 2020-12-23 LAB — COMPREHENSIVE METABOLIC PANEL
ALT: 16 U/L (ref 0–44)
AST: 21 U/L (ref 15–41)
Albumin: 4.2 g/dL (ref 3.5–5.0)
Alkaline Phosphatase: 56 U/L (ref 38–126)
Anion gap: 11 (ref 5–15)
BUN: 20 mg/dL (ref 6–20)
CO2: 26 mmol/L (ref 22–32)
Calcium: 9.3 mg/dL (ref 8.9–10.3)
Chloride: 103 mmol/L (ref 98–111)
Creatinine, Ser: 0.78 mg/dL (ref 0.44–1.00)
GFR, Estimated: >60 mL/min (ref >60)
Glucose, Bld: 134 mg/dL — ABNORMAL HIGH (ref 70–99)
Potassium: 3.7 mmol/L (ref 3.5–5.1)
Sodium: 140 mmol/L (ref 135–145)
Total Bilirubin: 0.8 mg/dL (ref 0.3–1.2)
Total Protein: 7.1 g/dL (ref 6.5–8.1)

## 2020-12-23 LAB — APTT: aPTT: 26 seconds (ref 24–36)

## 2020-12-23 LAB — CBG MONITORING, ED: Glucose-Capillary: 129 mg/dL — ABNORMAL HIGH (ref 70–99)

## 2020-12-23 MED ORDER — SODIUM CHLORIDE 0.9% FLUSH: 3.0000 mL | Freq: Once | INTRAVENOUS | Status: DC

## 2020-12-23 MED ORDER — MONTELUKAST SODIUM 10 MG PO TABS
10.0000 mg | ORAL_TABLET | Freq: Every day | ORAL | Status: DC
  Filled 2020-12-23 (×5): qty 1

## 2020-12-23 MED ORDER — MELATONIN 5 MG PO TABS
5.0000 mg | ORAL_TABLET | Freq: Every evening | ORAL | Status: DC | PRN
  Administered 2020-12-23 – 2020-12-24 (×2): 10 mg via ORAL
  Filled 2020-12-23 (×2): qty 2

## 2020-12-23 MED ORDER — STROKE: EARLY STAGES OF RECOVERY BOOK: Freq: Once | Status: DC

## 2020-12-23 MED ORDER — TOPIRAMATE 25 MG PO TABS
25.0000 mg | ORAL_TABLET | Freq: Every day | ORAL | Status: DC
Start: 2020-12-24 — End: 2020-12-26
  Administered 2020-12-24 – 2020-12-26 (×3): 25 mg via ORAL
  Filled 2020-12-23 (×3): qty 1

## 2020-12-23 MED ORDER — ATORVASTATIN CALCIUM 80 MG PO TABS
80.0000 mg | ORAL_TABLET | Freq: Every day | ORAL | Status: DC
  Administered 2020-12-23 – 2020-12-25 (×3): 80 mg via ORAL
  Filled 2020-12-23: qty 1
  Filled 2020-12-23: qty 4
  Filled 2020-12-23: qty 1

## 2020-12-23 MED ORDER — LORATADINE 10 MG PO TABS
10.0000 mg | ORAL_TABLET | Freq: Every day | ORAL | Status: DC
  Administered 2020-12-24 – 2020-12-26 (×3): 10 mg via ORAL
  Filled 2020-12-23 (×3): qty 1

## 2020-12-23 MED ORDER — VITAMIN D3 25 MCG (1000 UNIT) PO TABS
1000.0000 [IU] | ORAL_TABLET | Freq: Every day | ORAL | Status: DC
  Administered 2020-12-24 – 2020-12-26 (×3): 1000 [IU] via ORAL
  Filled 2020-12-23 (×7): qty 1

## 2020-12-23 MED ORDER — VITAMIN B-12 1000 MCG PO TABS
500.0000 ug | ORAL_TABLET | Freq: Every day | ORAL | Status: DC
  Administered 2020-12-24 – 2020-12-26 (×3): 500 ug via ORAL
  Filled 2020-12-23 (×3): qty 1

## 2020-12-23 MED ORDER — ONDANSETRON 4 MG PO TBDP: 4.0000 mg | ORAL_TABLET | Freq: Once | ORAL | Status: AC

## 2020-12-23 MED ORDER — ACETAMINOPHEN ER 650 MG PO TBCR: 650.0000 mg | EXTENDED_RELEASE_TABLET | Freq: Two times a day (BID) | ORAL | Status: DC | PRN

## 2020-12-23 MED ORDER — FLUTICASONE PROPIONATE 50 MCG/ACT NA SUSP
1.0000 | Freq: Every day | NASAL | Status: DC | PRN
  Filled 2020-12-23: qty 16

## 2020-12-23 MED ORDER — CITALOPRAM HYDROBROMIDE 20 MG PO TABS
20.0000 mg | ORAL_TABLET | Freq: Every day | ORAL | Status: DC
  Administered 2020-12-24 – 2020-12-26 (×3): 20 mg via ORAL
  Filled 2020-12-23 (×3): qty 1

## 2020-12-23 MED ORDER — ADULT MULTIVITAMIN W/MINERALS CH
1.0000 | ORAL_TABLET | Freq: Every day | ORAL | Status: DC
  Administered 2020-12-24 – 2020-12-26 (×3): 1 via ORAL
  Filled 2020-12-23 (×3): qty 1

## 2020-12-23 MED ORDER — ASPIRIN EC 81 MG PO TBEC
81.0000 mg | DELAYED_RELEASE_TABLET | Freq: Every day | ORAL | Status: DC
  Administered 2020-12-24 – 2020-12-26 (×3): 81 mg via ORAL
  Filled 2020-12-23 (×3): qty 1

## 2020-12-23 MED ORDER — ACETAMINOPHEN 325 MG PO TABS
650.0000 mg | ORAL_TABLET | Freq: Four times a day (QID) | ORAL | Status: DC | PRN
  Administered 2020-12-23 – 2020-12-24 (×4): 650 mg via ORAL
  Filled 2020-12-23 (×4): qty 2

## 2020-12-23 MED ORDER — CLOPIDOGREL BISULFATE 75 MG PO TABS
75.0000 mg | ORAL_TABLET | Freq: Every day | ORAL | Status: DC
  Administered 2020-12-24 – 2020-12-26 (×3): 75 mg via ORAL
  Filled 2020-12-23 (×3): qty 1

## 2020-12-23 MED ORDER — MULTIVITAMIN ADULTS 50+ PO TABS: ORAL_TABLET | Freq: Every day | ORAL | Status: DC

## 2020-12-23 MED ORDER — ALBUTEROL SULFATE HFA 108 (90 BASE) MCG/ACT IN AERS
2.0000 | INHALATION_SPRAY | Freq: Four times a day (QID) | RESPIRATORY_TRACT | Status: DC | PRN
  Filled 2020-12-23: qty 6.7

## 2020-12-23 MED ORDER — ONDANSETRON 4 MG PO TBDP
ORAL_TABLET | ORAL | Status: AC
  Administered 2020-12-23: 4 mg via ORAL
  Filled 2020-12-23: qty 1

## 2020-12-23 MED ORDER — TRIAMCINOLONE ACETONIDE 55 MCG/ACT NA AERO: 1.0000 | INHALATION_SPRAY | Freq: Every day | NASAL | Status: DC | PRN

## 2020-12-23 NOTE — ED Triage Notes (Addendum)
Pt comes with c/o weakness that started this am. Pt states she woke up with right sided weakness and slurred speech. Pt states this was around 0430am. Pt states it has since subsided. Pt was recently dx  With TIA few days ago. Pt states similar symptoms.  Pt speaking in complete sentences at this time. Right sided drift noted.

## 2020-12-23 NOTE — ED Notes (Signed)
Patient returned to room. 

## 2020-12-23 NOTE — ED Notes (Signed)
Sent rainbow to lab. 

## 2020-12-23 NOTE — ED Notes (Signed)
Patient to MRI.

## 2020-12-23 NOTE — ED Notes (Signed)
Patient transported to MRI 

## 2020-12-23 NOTE — H&P (Signed)
History and Physical    [name] GGY:[id number] DOB: 04-01-1961 DOA: 12/23/2020  PCP: Reubin Milan, MD   Patient coming from: Home  I have personally briefly reviewed patient's old medical records in [facility]  Chief Complaint: Right-sided weakness   HPI: [name] is a 60 y.o. female with medical history significant for recent pontine stroke, history of migraine headaches who presents to the emergency room via private vehicle for evaluation of worsening right-sided weakness involving her right upper and lower extremity as well as slurred speech.  Symptoms started around 4:30 AM and have persisted since then.  Patient presented to the ER around 9:30 AM. Patient was discharged from the hospital on 12/20/20 following hospitalization for an acute left pontine infarct.  She had right-sided weakness and slurred speech which resolved prior to her discharge. She denies having any chest pain, no shortness of breath, no nausea, no vomiting, no dizziness, no lightheadedness, no diaphoresis, no urinary symptoms, no constipation, no diarrhea, no abdominal pain, no fever, no chills. Labs show sodium 140, potassium 3.7, chloride 103, bicarb 26, glucose 134, BUN 20, creatinine 0.78, calcium 9.3, alkaline phosphatase 56, albumin 4.2, AST 21, ALT 16, total protein 7.1, white count 7.2, hemoglobin 13.7, hematocrit 40.7, MCV 89.1, RDW 13, platelet count 275 SARS coronavirus 2 PCR test is pending CT scan of the head without contrast shows No acute intracranial hemorrhage or evidence of acute infarction. Small chronic left cerebellar infarct. Twelve-lead EKG shows normal sinus rhythm    ED Course: Patient is a 60 year old female with a history of a recent left pontine infarct and migraine headaches who presents to the ER for evaluation of worsening right-sided weakness and slurred speech.  Patient was not a candidate for TPA due to her recent pontine infarct.  Initial CT scan of the head  without contrast is negative for bleed.  She will be admitted to the hospital for further evaluation.  Review of Systems: As per HPI otherwise all systems reviewed and negative.    Past Medical History:  Diagnosis Date   Arthritis    hands, feet, knees   Asthma    Migraine    Mood change    due to menopause/ hot flashes   PONV (postoperative nausea and vomiting)     Past Surgical History:  Procedure Laterality Date   ABDOMINAL HYSTERECTOMY     APPENDECTOMY     CESAREAN SECTION     x 1   COLONOSCOPY  2012   due in 2017 for suspicious lesion   COLONOSCOPY WITH PROPOFOL N/A 08/20/2016   Procedure: COLONOSCOPY WITH PROPOFOL;  Surgeon: Midge Minium, MD;  Location: [facility];  Service: Endoscopy;  Laterality: N/A;   PARTIAL HYSTERECTOMY     one ovary left   REPAIR ANKLE LIGAMENT     from fall   ROTATOR CUFF REPAIR Left    shoulder   TONSILLECTOMY       reports that she has never smoked. She has never used smokeless tobacco. She reports current alcohol use. She reports that she does not use drugs.  Allergies  Allergen Reactions   Clavulanic Acid Diarrhea   Oxycodone Itching   Tape Rash    Family History  Problem Relation Age of Onset   Diabetes Paternal Uncle    Breast cancer Paternal Grandmother 49   CAD Father      Prior to Admission medications   Medication Sig Start Date End Date Taking? Authorizing Provider  acetaminophen (TYLENOL) 650 MG  CR tablet Take 650 mg by mouth 2 (two) times daily as needed for pain.    [provider]  albuterol (PROVENTIL) (2.5 MG/3ML) 0.083% nebulizer solution Take 3 mLs (2.5 mg total) by nebulization every 6 (six) hours as needed for wheezing or shortness of breath. 01/05/19   Reubin MilanBerglund, Laura H, MD  albuterol (VENTOLIN HFA) 108 (90 Base) MCG/ACT inhaler Inhale 2 puffs into the lungs every 6 (six) hours as needed for wheezing or shortness of breath. 09/17/20   Reubin MilanBerglund, Laura H, MD  aspirin EC 81 MG  EC tablet Take 1 tablet (81 mg total) by mouth daily. Swallow whole. 12/20/20   Lewie ChamberGirguis, David, MD  atorvastatin (LIPITOR) 80 MG tablet Take 1 tablet (80 mg total) by mouth daily. 12/20/20   Lewie ChamberGirguis, David, MD  cetirizine (ZYRTEC) 10 MG chewable tablet Chew 10 mg by mouth daily as needed for allergies.    [provider]  citalopram (CELEXA) 20 MG tablet Take 1 tablet (20 mg total) by mouth daily. 09/17/20   Reubin MilanBerglund, Laura H, MD  clopidogrel (PLAVIX) 75 MG tablet Take 1 tablet (75 mg total) by mouth daily for 21 days. 12/20/20 01/10/21  Lewie ChamberGirguis, David, MD  cyanocobalamin 500 MCG tablet Take 500 mcg by mouth daily.    [provider]  melatonin 5 MG TABS Take 5-10 mg by mouth at bedtime as needed (sleep).    [provider]  montelukast (SINGULAIR) 10 MG tablet Take 1 tablet (10 mg total) by mouth at bedtime. 03/04/20   Reubin MilanBerglund, Laura H, MD  Multiple Vitamins-Minerals (MULTIVITAMIN ADULTS 50+ PO) Take 1 capsule by mouth daily.    [provider]  tiZANidine (ZANAFLEX) 4 MG tablet Take 1 tablet (4 mg total) by mouth 3 (three) times daily. 12/15/20   Reubin MilanBerglund, Laura H, MD  topiramate (TOPAMAX) 25 MG tablet Take 1 tablet (25 mg total) by mouth daily. 01/13/20   Reubin MilanBerglund, Laura H, MD  Triamcinolone Acetonide (NASACORT ALLERGY 24HR NA) Place 1-2 sprays into both nostrils daily as needed (allergies).    [provider]  Vitamin D, Cholecalciferol, 1000 UNITS CAPS Take 1,000 Units by mouth daily.    [provider]    Physical Exam: Vitals:   12/23/20 1026 12/23/20 1149 12/23/20 1343 12/23/20 1435  BP: 128/80 132/60 115/62 135/84  Pulse: 66 64 63 71  Resp: (!) 23 (!) 24 20 19   Temp: 98.8 F (37.1 C) 98.8 F (37.1 C) 98.4 F (36.9 C)   TempSrc: Oral Oral Oral   SpO2: 98% 100% 98% 98%     Vitals:   12/23/20 1026 12/23/20 1149 12/23/20 1343 12/23/20 1435  BP: 128/80 132/60 115/62 135/84  Pulse: 66 64 63 71  Resp: (!) 23 (!) 24 20 19   Temp: 98.8  F (37.1 C) 98.8 F (37.1 C) 98.4 F (36.9 C)   TempSrc: Oral Oral Oral   SpO2: 98% 100% 98% 98%    Constitutional: NAD, alert and oriented x 3 Eyes: PERRL, lids and conjunctivae normal ENMT: Mucous membranes are moist.  Neck: normal, supple, no masses, no thyromegaly Respiratory: clear to auscultation bilaterally, no wheezing, no crackles. Normal respiratory effort. No accessory muscle use.  Cardiovascular: Regular rate and rhythm, no murmurs / rubs / gallops. No extremity edema. 2+ pedal pulses. No carotid bruits.  Abdomen: no tenderness, no masses palpated. No hepatosplenomegaly. Bowel sounds positive.  Musculoskeletal: no clubbing / cyanosis. No joint deformity upper and lower extremities.  Skin: no rashes, lesions, ulcers.  Neurologic: Strength  3/5 in both right upper and lower extremity. Rt sided facial droop Psychiatric: Normal mood and affect.   Labs on Admission: I have personally reviewed following labs and imaging studies  CBC: Recent Labs  Lab 12/19/20 0934 12/23/20 0909  WBC 6.2 7.2  NEUTROABS 3.4 4.0  HGB 14.1 13.7  HCT 42.5 40.7  MCV 91.0 89.1  PLT 267 275   Basic Metabolic Panel: Recent Labs  Lab 12/19/20 0934 12/23/20 0909  NA 140 140  K 3.6 3.7  CL 104 103  CO2 26 26  GLUCOSE 114* 134*  BUN 22* 20  CREATININE 0.97 0.78  CALCIUM 9.0 9.3   GFR: Estimated Creatinine Clearance: 77.3 mL/min (by C-G formula based on SCr of 0.78 mg/dL). Liver Function Tests: Recent Labs  Lab 12/19/20 0934 12/23/20 0909  AST 20 21  ALT 15 16  ALKPHOS 51 56  BILITOT 0.9 0.8  PROT 7.2 7.1  ALBUMIN 4.2 4.2   No results for input(s): LIPASE, AMYLASE in the last 168 hours. No results for input(s): AMMONIA in the last 168 hours. Coagulation Profile: Recent Labs  Lab 12/19/20 0934 12/23/20 0909  INR 1.0 1.0   Cardiac Enzymes: No results for input(s): CKTOTAL, CKMB, CKMBINDEX, TROPONINI in the last 168 hours. BNP (last 3 results) No results for input(s):  PROBNP in the last 8760 hours. HbA1C: No results for input(s): HGBA1C in the last 72 hours. CBG: Recent Labs  Lab 12/23/20 0905  GLUCAP 129*   Lipid Profile: No results for input(s): CHOL, HDL, LDLCALC, TRIG, CHOLHDL, LDLDIRECT in the last 72 hours. Thyroid Function Tests: No results for input(s): TSH, T4TOTAL, FREET4, T3FREE, THYROIDAB in the last 72 hours. Anemia Panel: No results for input(s): VITAMINB12, FOLATE, FERRITIN, TIBC, IRON, RETICCTPCT in the last 72 hours. Urine analysis:    Component Value Date/Time   COLORURINE YELLOW (A) 12/19/2020 0926   APPEARANCEUR HAZY (A) 12/19/2020 0926   LABSPEC 1.012 12/19/2020 0926   PHURINE 7.0 12/19/2020 0926   GLUCOSEU NEGATIVE 12/19/2020 0926   HGBUR NEGATIVE 12/19/2020 0926   BILIRUBINUR NEGATIVE 12/19/2020 0926   BILIRUBINUR neg 09/17/2020 0852   KETONESUR NEGATIVE 12/19/2020 0926   PROTEINUR NEGATIVE 12/19/2020 0926   UROBILINOGEN 0.2 09/17/2020 0852   NITRITE NEGATIVE 12/19/2020 0926   LEUKOCYTESUR NEGATIVE 12/19/2020 0926    Radiological Exams on Admission: CT HEAD WO CONTRAST  Result Date: 12/23/2020 CLINICAL DATA:  Right-sided weakness and slurred speech EXAM: CT HEAD WITHOUT CONTRAST TECHNIQUE: Contiguous axial images were obtained from the base of the skull through the vertex without intravenous contrast. COMPARISON:  None. FINDINGS: Brain: There is no acute intracranial hemorrhage, mass effect, or edema. Gray-white differentiation is preserved. There is no extra-axial fluid collection. Ventricles and sulci are within normal limits in size and configuration. Small chronic infarct of the left cerebellum. Vascular: No hyperdense vessel. Skull: Calvarium is unremarkable. Sinuses/Orbits: No acute finding. Other: None. IMPRESSION: No acute intracranial hemorrhage or evidence of acute infarction. Small chronic left cerebellar infarct. Electronically Signed   By: Guadlupe Spanish M.D.   On: 12/23/2020 09:56    EKG: Independently  reviewed.  Normal sinus rhythm  Assessment/Plan Principal Problem:   Acute CVA (cerebrovascular accident) (HCC) Active Problems:   Obesity, Class I, BMI 30-34.9   History of migraine headaches    Acute CVA Patient who was recently discharged from the hospital after she was diagnosed with an acute left pontine infarct with right-sided weakness and dysarthria which resolved. She returns to the ER 3 days post  discharge for evaluation of worsening right-sided weakness and dysarthria. CT scan of the head without contrast is negative for an acute bleed Will obtain MRI of the brain to rule out an acute stroke Patient had a recent 2D echocardiogram which showed an LVEF of 60 to 65% with no evidence of a cardiac thrombus Recent carotid Doppler shows no evidence of any hemodynamically significant plaque Continue DAPT and high intensity statins We will consult neurology We will request PT/OT/speech therapy consult Allow for permissive hypertension     Obesity (BMI 30.8) Complicates overall prognosis and care   Migraine headache Continue Topamax  Patient was on rizatriptan which has been discontinued since patient had an acute ischemic stroke   Depression Continue Celexa   DVT prophylaxis: SCD Code Status: Full code Family Communication: Greater than 50% of time was spent discussing plan of care with patient at the bedside. All questions and concerns have been addressed. Disposition Plan: Back to previous home environment Consults called: Neurology    Lucile Shutters MD [facility]     12/23/2020, 4:04 PM

## 2020-12-23 NOTE — ED Notes (Signed)
Patient had sudden onset of nausea and vomiting prior to taking lipitor/melatonin. Patient given zofran odt, feeling better, denies any further nausea. Patient able to take medication without nausea, provided with ginger ale.

## 2020-12-23 NOTE — ED Provider Notes (Signed)
[facility] Emergency Department Provider Note   ____________________________________________   Event Date/Time   First MD Initiated Contact with Patient 12/23/20 1500     (approximate)  I have reviewed the triage vital signs and the nursing notes.   HISTORY  Chief Complaint Weakness    HPI [name] is a 60 y.o. female with past medical history of migraines, hyperlipidemia, and stroke who presents to the ED for weakness.  Patient was recently admitted for weakness to the right arm along with speech difficulties, discharged from the hospital 3 days ago after MRI showed small left pontine stroke.  At the time of her discharge, all of her neurologic symptoms had resolved and she was doing well.  She felt fine when she went to bed last night, but when she woke up around 430 this morning she noticed difficulty speaking along with right arm and leg weakness.  She describes the weakness affecting her right arm is worse than anything she felt during recent admission.  She states she has been compliant with the aspirin and Plavix she was prescribed.  She denies any fevers, cough, chest pain, shortness of breath, dysuria, or hematuria.  She did not initially seek care because she thought the symptoms could be a reaction to Plavix.        Past Medical History:  Diagnosis Date  . Arthritis    hands, feet, knees  . Asthma   . Migraine   . Mood change    due to menopause/ hot flashes  . PONV (postoperative nausea and vomiting)     Patient Active Problem List   Diagnosis Date Noted  . Acute CVA (cerebrovascular accident) (HCC) 12/19/2020  . Status post total hysterectomy and bilateral salpingo-oophorectomy 09/23/2020  . Generalized osteoarthritis of hand 01/27/2017  . Chronic tension-type headache, not intractable 01/17/2017  . Mood disorder (HCC) 07/13/2016  . Foot pain, left 07/13/2016  . Prediabetes 11/07/2015  . Mixed hyperlipidemia 11/07/2015  .  Low serum thyroid stimulating hormone (TSH) 09/30/2015  . Osteoarthritis 09/29/2015  . Menopausal syndrome (hot flashes) 09/29/2015  . Obesity, Class I, BMI 30-34.9 09/26/2015  . Vitamin D deficiency 09/26/2015  . Migraine headache without aura 09/26/2015  . Allergic asthma, mild intermittent, uncomplicated 09/26/2015  . Colon polyp 09/26/2015  . Synovial cyst of left knee 09/26/2015  . Environmental and seasonal allergies 09/26/2015    Past Surgical History:  Procedure Laterality Date  . ABDOMINAL HYSTERECTOMY    . APPENDECTOMY    . CESAREAN SECTION     x 1  . COLONOSCOPY  2012   due in 2017 for suspicious lesion  . COLONOSCOPY WITH PROPOFOL N/A 08/20/2016   Procedure: COLONOSCOPY WITH PROPOFOL;  Surgeon: Midge Minium, MD;  Location: [facility];  Service: Endoscopy;  Laterality: N/A;  . PARTIAL HYSTERECTOMY     one ovary left  . REPAIR ANKLE LIGAMENT     from fall  . ROTATOR CUFF REPAIR Left    shoulder  . TONSILLECTOMY      Prior to Admission medications   Medication Sig Start Date End Date Taking? Authorizing Provider  acetaminophen (TYLENOL) 650 MG CR tablet Take 650 mg by mouth 2 (two) times daily as needed for pain.    [provider]  albuterol (PROVENTIL) (2.5 MG/3ML) 0.083% nebulizer solution Take 3 mLs (2.5 mg total) by nebulization every 6 (six) hours as needed for wheezing or shortness of breath. 01/05/19   Reubin Milan, MD  albuterol (VENTOLIN HFA)  108 (90 Base) MCG/ACT inhaler Inhale 2 puffs into the lungs every 6 (six) hours as needed for wheezing or shortness of breath. 09/17/20   Reubin Milan, MD  aspirin EC 81 MG EC tablet Take 1 tablet (81 mg total) by mouth daily. Swallow whole. 12/20/20   Lewie Chamber, MD  atorvastatin (LIPITOR) 80 MG tablet Take 1 tablet (80 mg total) by mouth daily. 12/20/20   Lewie Chamber, MD  cetirizine (ZYRTEC) 10 MG chewable tablet Chew 10 mg by mouth daily as needed for allergies.    [provider]   citalopram (CELEXA) 20 MG tablet Take 1 tablet (20 mg total) by mouth daily. 09/17/20   Reubin Milan, MD  clopidogrel (PLAVIX) 75 MG tablet Take 1 tablet (75 mg total) by mouth daily for 21 days. 12/20/20 01/10/21  Lewie Chamber, MD  cyanocobalamin 500 MCG tablet Take 500 mcg by mouth daily.    [provider]  melatonin 5 MG TABS Take 5-10 mg by mouth at bedtime as needed (sleep).    [provider]  montelukast (SINGULAIR) 10 MG tablet Take 1 tablet (10 mg total) by mouth at bedtime. 03/04/20   Reubin Milan, MD  Multiple Vitamins-Minerals (MULTIVITAMIN ADULTS 50+ PO) Take 1 capsule by mouth daily.    [provider]  tiZANidine (ZANAFLEX) 4 MG tablet Take 1 tablet (4 mg total) by mouth 3 (three) times daily. 12/15/20   Reubin Milan, MD  topiramate (TOPAMAX) 25 MG tablet Take 1 tablet (25 mg total) by mouth daily. 01/13/20   Reubin Milan, MD  Triamcinolone Acetonide (NASACORT ALLERGY 24HR NA) Place 1-2 sprays into both nostrils daily as needed (allergies).    [provider]  Vitamin D, Cholecalciferol, 1000 UNITS CAPS Take 1,000 Units by mouth daily.    [provider]    Allergies Clavulanic acid, Oxycodone, and Tape  Family History  Problem Relation Age of Onset  . Diabetes Paternal Uncle   . Breast cancer Paternal Grandmother 10  . CAD Father     Social History Social History   Tobacco Use  . Smoking status: Never Smoker  . Smokeless tobacco: Never Used  Vaping Use  . Vaping Use: Never used  Substance Use Topics  . Alcohol use: Yes    Alcohol/week: 0.0 standard drinks    Comment: 2 drinks - 2x/mo  . Drug use: No    Review of Systems  Constitutional: No fever/chills Eyes: No visual changes. ENT: No sore throat. Cardiovascular: Denies chest pain. Respiratory: Denies shortness of breath. Gastrointestinal: No abdominal pain.  No nausea, no vomiting.  No diarrhea.  No constipation. Genitourinary: Negative for  dysuria. Musculoskeletal: Negative for back pain. Skin: Negative for rash. Neurological: Negative for headaches, positive for right sided numbness and weakness.  Positive for facial droop and speech difficulties. ____________________________________________   PHYSICAL EXAM:  VITAL SIGNS: ED Triage Vitals  Enc Vitals Group     BP 12/23/20 0908 128/71     Pulse Rate 12/23/20 0908 75     Resp 12/23/20 0908 19     Temp 12/23/20 0908 98.3 F (36.8 C)     Temp Source 12/23/20 1026 Oral     SpO2 12/23/20 0908 97 %     Weight --      Height --      Head Circumference --      Peak Flow --      Pain Score 12/23/20 0904 0     Pain Loc --  Pain Edu? --      Excl. in GC? --     Constitutional: Alert and oriented. Eyes: Conjunctivae are normal. Head: Atraumatic. Nose: No congestion/rhinnorhea. Mouth/Throat: Mucous membranes are moist. Neck: Normal ROM Cardiovascular: Normal rate, regular rhythm. Grossly normal heart sounds. Respiratory: Normal respiratory effort.  No retractions. Lungs CTAB. Gastrointestinal: Soft and nontender. No distention. Genitourinary: deferred Musculoskeletal: No lower extremity tenderness nor edema. Neurologic:  Normal speech and language.  Right-sided facial droop noted.  3-5 strength in right upper extremity, 4 out of 5 strength in right lower extremity.  5-5 strength in left upper and lower extremities. Skin:  Skin is warm, dry and intact. No rash noted. Psychiatric: Mood and affect are normal. Speech and behavior are normal.  ____________________________________________   LABS (all labs ordered are listed, but only abnormal results are displayed)  Labs Reviewed  COMPREHENSIVE METABOLIC PANEL - Abnormal; Notable for the following components:      Result Value   Glucose, Bld 134 (*)    All other components within normal limits  CBG MONITORING, ED - Abnormal; Notable for the following components:   Glucose-Capillary 129 (*)    All other  components within normal limits  SARS CORONAVIRUS 2 (TAT 6-24 HRS)  PROTIME-INR  APTT  CBC  DIFFERENTIAL  URINALYSIS, COMPLETE (UACMP) WITH MICROSCOPIC  HIV ANTIBODY (ROUTINE TESTING W REFLEX)  CBG MONITORING, ED   ____________________________________________  EKG  ED ECG REPORT I, Chesley Noon, the attending physician, personally viewed and interpreted this ECG.   Date: 12/23/2020  EKG Time: 9:06  Rate: 76  Rhythm: normal sinus rhythm  Axis: Normal  Intervals:none  ST&T Change: None   PROCEDURES  Procedure(s) performed (including Critical Care):  Procedures   ____________________________________________   INITIAL IMPRESSION / ASSESSMENT AND PLAN / ED COURSE       60 year old female with past medical history of migraines, hyperlipidemia, and stroke who presents to the ED with right-sided numbness and weakness along with facial droop noticed upon waking this morning at 4:30 AM.  She does have right-sided deficits on my assessment, but no apparent speech difficulties.  She is outside the window for TPA and TPA is also contraindicated given her recent confirmed stroke on MRI.  It is difficult to determine whether this is worsening of the same stroke or new acute stroke.  Case discussed with neurology, who recommends repeat MRI brain and admission.  Labs thus far are unremarkable and we do not need to repeat angiography as she had MRA during admission showing no LVO.  Dr. Wilford Corner of neurology recommends continuing aspirin and Plavix for now.  Case discussed with hospitalist for admission.      ____________________________________________   FINAL CLINICAL IMPRESSION(S) / ED DIAGNOSES  Final diagnoses:  Cerebrovascular accident (CVA), unspecified mechanism (HCC)  Right sided weakness     ED Discharge Orders    None       Note:  This document was prepared using Dragon voice recognition software and may include unintentional dictation errors.   Chesley Noon,  MD 12/23/20 1600

## 2020-12-23 NOTE — Telephone Encounter (Signed)
Called pt to give her advice. Pt is on the way to the hospital [facility]) she is having difficulty with speak. Pts husband is taking her.   KP

## 2020-12-23 NOTE — ED Notes (Signed)
Admitting provider at bedside.

## 2020-12-23 NOTE — Telephone Encounter (Signed)
Pt got home from hospital on Sat. and has some new meds on her list. Husband states pt is having some muscle soreness side effects from the Lipitor.  Pt also reports not being able to sleep. He is requesting Dr Asencion Partridge speak to [facility] asap.  Would like dr to call her on the phone.

## 2020-12-23 NOTE — ED Notes (Signed)
Resumed care from robin rn.  Pt awake and alert.  Denies pain.  nsr on monitor.

## 2020-12-23 NOTE — Progress Notes (Signed)
Called by Dr. Larinda Buttery from the ER regarding this patient's worsening right-sided symptoms. Recent stroke on 12/19/2020-left pontine, and on discharge not much in terms of residual weakness. Given the recent stroke, not a candidate for TPA-last known well today also outside the TPA window. Symptoms described do not point to large vessel occlusion. Vessel imaging on 12/19/2020-MRA head and carotid Dopplers also unremarkable for LVO.   At this time: Working differentials are expansion of the brainstem stroke from 12/19/2020 versus hemorrhagic transformation of the stroke versus recrudescence of symptoms in the setting of an acute toxic/metabolic insult.   Recommendations over the phone: Recommended obtaining repeat MRI If any evidence of bleed-stop antiplatelets otherwise continue dual antiplatelet therapy. The stroke size is increased and patient needs PT/OT etc. therapies-admit to hospital and neurology will follow tomorrow as a nonurgent consultation. Also check UA, chest x-ray   For any questions, till 8 PM, I am on call, feel free to reach me via secure chat or page me on my pager.  After 8 PM, TeleStroke services are only available for emergent code stroke evaluations. I will be available back starting 8 AM tomorrow morning.   Plan discussed with Dr. Larinda Buttery.  -- Milon Dikes, MD Neurologist [facility] Pager: [phone]

## 2020-12-24 ENCOUNTER — Inpatient Hospital Stay: Payer: Self-pay

## 2020-12-24 ENCOUNTER — Encounter: Payer: Self-pay | Admitting: Internal Medicine

## 2020-12-24 DIAGNOSIS — Z8669 Personal history of other diseases of the nervous system and sense organs: Secondary | ICD-10-CM

## 2020-12-24 DIAGNOSIS — I639 Cerebral infarction, unspecified: Secondary | ICD-10-CM

## 2020-12-24 LAB — LIPID PANEL
Cholesterol: 168 mg/dL (ref 0–200)
HDL: 68 mg/dL (ref >40)
LDL Cholesterol: 86 mg/dL (ref 0–99)
Total CHOL/HDL Ratio: 2.5 RATIO
Triglycerides: 70 mg/dL (ref <150)
VLDL: 14 mg/dL (ref 0–40)

## 2020-12-24 LAB — URINALYSIS, COMPLETE (UACMP) WITH MICROSCOPIC
Bacteria, UA: NONE SEEN
Bilirubin Urine: NEGATIVE
Glucose, UA: NEGATIVE mg/dL
Hgb urine dipstick: NEGATIVE
Ketones, ur: NEGATIVE mg/dL
Nitrite: NEGATIVE
Protein, ur: NEGATIVE mg/dL
Specific Gravity, Urine: 1.018 (ref 1.005–1.030)
pH: 5 (ref 5.0–8.0)

## 2020-12-24 LAB — HEMOGLOBIN A1C
Hgb A1c MFr Bld: 5.6 % (ref 4.8–5.6)
Mean Plasma Glucose: 114.02 mg/dL

## 2020-12-24 LAB — HIV ANTIBODY (ROUTINE TESTING W REFLEX): HIV Screen 4th Generation wRfx: NONREACTIVE

## 2020-12-24 LAB — SARS CORONAVIRUS 2 (TAT 6-24 HRS): SARS Coronavirus 2: NEGATIVE

## 2020-12-24 MED ORDER — IOHEXOL 350 MG/ML SOLN
75.0000 mL | Freq: Once | INTRAVENOUS | Status: AC | PRN
  Administered 2020-12-24: 75 mL via INTRAVENOUS
  Filled 2020-12-24: qty 75

## 2020-12-24 MED ORDER — ONDANSETRON HCL 4 MG/2ML IJ SOLN
4.0000 mg | Freq: Four times a day (QID) | INTRAMUSCULAR | Status: DC | PRN
  Administered 2020-12-24 – 2020-12-26 (×6): 4 mg via INTRAVENOUS
  Filled 2020-12-24 (×6): qty 2

## 2020-12-24 MED ORDER — SODIUM CHLORIDE 0.9 % IV SOLN: INTRAVENOUS | Status: DC

## 2020-12-24 MED ORDER — SODIUM CHLORIDE 0.9 % IV BOLUS
1000.0000 mL | Freq: Once | INTRAVENOUS | Status: AC
  Administered 2020-12-24: 1000 mL via INTRAVENOUS

## 2020-12-24 NOTE — Evaluation (Signed)
Occupational Therapy Evaluation Patient Details Name: [name] MRN: [id number] DOB: 1961-08-26 Today's Date: 12/24/2020    History of Present Illness 60 y.o. female with medical history significant for recent pontine stroke, history of migraine headaches who presents to the emergency room via private vehicle for evaluation of worsening right-sided weakness involving her right upper and lower extremity as well as slurred speech.  Symptoms started around 4:30 AM and have persisted since then.   Patient presented to the ER around 9:30 AM.  Patient was discharged from the hospital on 12/20/20 following hospitalization for an acute left pontine infarct.  She had right-sided weakness and slurred speech which resolved prior to her discharge.   Clinical Impression   Patient presenting with decreased I in self care, balance, functional mobility/transfer, endurance,R UE/LE strength, and safety awareness. Patient recently discharged from the hospital from [facility]. Pt reports being independent and living with husband PTA. She works full time with [employer].  Patient currently with 2-/5 strength in shoulder,elbow and wrist while in gravity eliminated position. Pt able to touch 1st and second digit to thumb in opposition but very weak grasp and unable to hold onto RW. Pt standing and ambulating 30' with mod assist and R knee buckling. Pt needing assistance with clothing management and balance during hygiene with toileting tasks. OT recommends CIR secondary to pt's great rehab potential, motivation level, and prior independence.  Patient will benefit from acute OT to increase overall independence in the areas of ADLs, functional mobility, and safety awareness in order to safely discharge to next venue of care.    Follow Up Recommendations  CIR;Supervision/Assistance - 24 hour    Equipment Recommendations  Other (comment) (defer to next venue of care)       Precautions / Restrictions  Precautions Precautions: Fall      Mobility Bed Mobility Overal bed mobility: Needs Assistance Bed Mobility: Supine to Sit;Sit to Supine     Supine to sit: Min assist Sit to supine: Min assist   General bed mobility comments: min A for trunk support and min cuing for technique    Transfers Overall transfer level: Needs assistance Equipment used: 1 person hand held assist Transfers: Sit to/from [employer] Sit to Stand: Mod assist Stand pivot transfers: Mod assist       General transfer comment: Pt needing assistance for balance. R knee buckles with ambulation to toilet requiring mod A for safety    Balance Overall balance assessment: Needs assistance Sitting-balance support: Feet supported Sitting balance-Leahy Scale: Good     Standing balance support: During functional activity Standing balance-Leahy Scale: Poor                             ADL either performed or assessed with clinical judgement   ADL Overall ADL's : Needs assistance/impaired Eating/Feeding: Set up;Supervision/ safety;Sitting   Grooming: Wash/dry hands;Wash/dry face;Set up;Sitting;Supervision/safety                                 General ADL Comments: Pt needs assistance to open containers, cut food, and is feeding self with L UE. Pt needing hand over hand assistance or pt utilizing L UE to move R UE for self care tasks.     Vision Patient Visual Report: Diplopia Vision Assessment?: No apparent visual deficits Additional Comments: Pt reports diplopia has resolved  Pertinent Vitals/Pain Pain Assessment: No/denies pain     Hand Dominance Right   Extremity/Trunk Assessment Upper Extremity Assessment Upper Extremity Assessment: RUE deficits/detail RUE Deficits / Details: 2-/5 in gravity eliminated position RUE Sensation: WNL RUE Coordination: decreased fine motor;decreased gross motor   Lower Extremity Assessment Lower Extremity  Assessment: Defer to PT evaluation   Cervical / Trunk Assessment Cervical / Trunk Assessment: Normal   Communication Communication Communication: No difficulties   Cognition Arousal/Alertness: Awake/alert Behavior During Therapy: WFL for tasks assessed/performed Overall Cognitive Status: Within Functional Limits for tasks assessed                                                Home Living Family/patient expects to be discharged to:: Private residence Living Arrangements: Spouse/significant other Available Help at Discharge: Family;Available 24 hours/day Type of Home: House Home Access: Stairs to enter [employer] of Steps: 3 Entrance Stairs-Rails: Left;Right;Can reach both Home Layout: One level     Bathroom Shower/Tub: Tub/shower unit         Home Equipment: None          Prior Functioning/Environment Level of Independence: Independent        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        OT Problem List: Decreased strength;Impaired balance (sitting and/or standing);Decreased safety awareness;Decreased activity tolerance;Cardiopulmonary status limiting activity;Decreased range of motion;Impaired UE functional use;Decreased knowledge of use of DME or AE;Decreased knowledge of precautions;Decreased coordination      OT Treatment/Interventions: Self-care/ADL training;Therapeutic exercise;Neuromuscular education;Energy conservation;DME and/or AE instruction;Cognitive remediation/compensation;Therapeutic activities;Balance training;Patient/family education;Manual therapy    OT Goals(Current goals can be found in the care plan section) Acute Rehab OT Goals Patient Stated Goal: to be able to move my right side again so I can work OT Goal Formulation: With patient Time For Goal Achievement: 01/07/21 Potential to Achieve Goals: Good ADL Goals Pt Will Perform Grooming: with supervision;sitting Pt Will Perform Lower Body Dressing: with min  assist;sit to/from stand Pt Will Transfer to Toilet: with min assist;ambulating Pt Will Perform Toileting - Clothing Manipulation and hygiene: with min assist;sit to/from stand Pt/caregiver will Perform Home Exercise Program: Increased strength;Right Upper extremity;With written HEP provided;With Supervision  OT Frequency: Min 3X/week   Barriers to D/C:    none at this time          AM-PAC OT "6 Clicks" Daily Activity     Outcome Measure Help from another person eating meals?: A Little Help from another person taking care of personal grooming?: A Little Help from another person toileting, which includes using toliet, bedpan, or urinal?: A Lot Help from another person bathing (including washing, rinsing, drying)?: A Lot Help from another person to put on and taking off regular upper body clothing?: A Little Help from another person to put on and taking off regular lower body clothing?: A Lot 6 Click Score: 15   End of Session Nurse Communication: Mobility status;Precautions  Activity Tolerance: Patient tolerated treatment well Patient left: in bed;with call bell/phone within reach  OT Visit Diagnosis: Muscle weakness (generalized) (M62.81);Unsteadiness on feet (R26.81);Hemiplegia and hemiparesis Hemiplegia - Right/Left: Right Hemiplegia - dominant/non-dominant: Dominant                Time: 2878-6767 OT Time Calculation (min): 30 min Charges:  OT General Charges $OT Visit: 1 Visit OT Evaluation $OT Eval Moderate  Complexity: 1 Mod OT Treatments $Self Care/Home Management : 8-22 mins $Neuromuscular Re-education: 8-22 mins  Jackquline Denmark, MS, OTR/L , CBIS ascom [phone]  12/24/20, 1:14 PM

## 2020-12-24 NOTE — ED Notes (Signed)
Pt given meal tray- containers opened and french toast cut up for pt

## 2020-12-24 NOTE — Consult Note (Signed)
Neurology Consultation  Reason for Consult: Stroke Referring Physician: Dr. Blake Divine  CC: Worsening right-sided weakness History is obtained from: Chart  HPI: [name] is a 60 y.o. female medical history of arthritis, asthma, migraine, recent lacunar infarct in the left pons with very mild residual right hemiparesis, discharged from the hospital on December 20, 2020 on dual antiplatelets, presented back to the emergency room yesterday on December 23, 2020 with worsening right-sided weakness. She says she went to bed normally on Monday, December 22, 2020 Woke up the morning of Tuesday, December 23, 2020 with much worse right upper and lower extremity weakness to the point that she could not move her right upper or lower extremity. Lower extremity weakness is improved some and she is able to lift leg antigravity with right upper extremity still remains extremely weak. Seen and evaluated in the ER, CT head negative for acute bleed.  Was discussed with me over the phone. Recommended further imaging to evaluate for possible expansion of the stroke versus hemorrhagic transformation.  Also recommended checking for any evidence of underlying infection that could cause recrudescence of symptoms. MRI revealed increased size of the previously seen pontine stroke  Patient was on triptans before the stroke and was recommended to discontinue them. She is not on hormonal contraception   LKW: Sometime Monday night-best estimate 10 PM 12/22/2020 tpa given?: no, recent stroke 3 days ago Premorbid modified Rankin scale (mRS): 1  ROS: Obtained and negative except as noted in HPI.  Past Medical History:  Diagnosis Date  . Arthritis    hands, feet, knees  . Asthma   . Migraine   . Mood change    due to menopause/ hot flashes  . PONV (postoperative nausea and vomiting)         Family History  Problem Relation Age of Onset  . Diabetes Paternal Uncle   . Breast cancer Paternal Grandmother 61  . CAD  Father      Social History:   reports that she has never smoked. She has never used smokeless tobacco. She reports current alcohol use. She reports that she does not use drugs.  Medications  Current Facility-Administered Medications:  .   stroke: mapping our early stages of recovery book, , Does not apply, Once, Agbata, Tochukwu, MD .  acetaminophen (TYLENOL) tablet 650 mg, 650 mg, Oral, Q6H PRN, Agbata, Tochukwu, MD, 650 mg at 12/24/20 1048 .  albuterol (VENTOLIN HFA) 108 (90 Base) MCG/ACT inhaler 2 puff, 2 puff, Inhalation, Q6H PRN, Agbata, Tochukwu, MD .  aspirin EC tablet 81 mg, 81 mg, Oral, Daily, Agbata, Tochukwu, MD, 81 mg at 12/24/20 1050 .  atorvastatin (LIPITOR) tablet 80 mg, 80 mg, Oral, QHS, Agbata, Tochukwu, MD, 80 mg at 12/23/20 2213 .  cholecalciferol (VITAMIN D) tablet 1,000 Units, 1,000 Units, Oral, Daily, Agbata, Tochukwu, MD, 1,000 Units at 12/24/20 1049 .  citalopram (CELEXA) tablet 20 mg, 20 mg, Oral, Daily, Agbata, Tochukwu, MD, 20 mg at 12/24/20 1051 .  clopidogrel (PLAVIX) tablet 75 mg, 75 mg, Oral, Daily, Agbata, Tochukwu, MD, 75 mg at 12/24/20 1051 .  fluticasone (FLONASE) 50 MCG/ACT nasal spray 1 spray, 1 spray, Each Nare, Daily PRN, Agbata, Tochukwu, MD .  loratadine (CLARITIN) tablet 10 mg, 10 mg, Oral, Daily, Agbata, Tochukwu, MD, 10 mg at 12/24/20 1048 .  melatonin tablet 5-10 mg, 5-10 mg, Oral, QHS PRN, Agbata, Tochukwu, MD, 10 mg at 12/23/20 2213 .  montelukast (SINGULAIR) tablet 10 mg, 10 mg, Oral, QHS, Agbata, Tochukwu, MD .  multivitamin with minerals tablet 1 tablet, 1 tablet, Oral, Daily, Agbata, Tochukwu, MD, 1 tablet at 12/24/20 1048 .  ondansetron (ZOFRAN) injection 4 mg, 4 mg, Intravenous, Q6H PRN, Kathlen Mody, MD, 4 mg at 12/24/20 1110 .  topiramate (TOPAMAX) tablet 25 mg, 25 mg, Oral, Daily, Agbata, Tochukwu, MD, 25 mg at 12/24/20 1050 .  vitamin B-12 (CYANOCOBALAMIN) tablet 500 mcg, 500 mcg, Oral, Daily, Agbata, Tochukwu, MD, 500 mcg at 12/24/20  1048  Current Outpatient Medications:  .  acetaminophen (TYLENOL) 650 MG CR tablet, Take 650 mg by mouth 2 (two) times daily as needed for pain., Disp: , Rfl:  .  albuterol (VENTOLIN HFA) 108 (90 Base) MCG/ACT inhaler, Inhale 2 puffs into the lungs every 6 (six) hours as needed for wheezing or shortness of breath., Disp: 18 g, Rfl: 1 .  aspirin EC 81 MG EC tablet, Take 1 tablet (81 mg total) by mouth daily. Swallow whole., Disp: 30 tablet, Rfl: 11 .  atorvastatin (LIPITOR) 80 MG tablet, Take 1 tablet (80 mg total) by mouth daily., Disp: 90 tablet, Rfl: 3 .  citalopram (CELEXA) 20 MG tablet, Take 1 tablet (20 mg total) by mouth daily., Disp: 90 tablet, Rfl: 1 .  clopidogrel (PLAVIX) 75 MG tablet, Take 1 tablet (75 mg total) by mouth daily for 21 days., Disp: 21 tablet, Rfl: 0 .  cyanocobalamin 500 MCG tablet, Take 500 mcg by mouth daily., Disp: , Rfl:  .  melatonin 5 MG TABS, Take 5-10 mg by mouth at bedtime as needed (sleep)., Disp: , Rfl:  .  Multiple Vitamins-Minerals (MULTIVITAMIN ADULTS 50+ PO), Take 1 capsule by mouth daily., Disp: , Rfl:  .  tiZANidine (ZANAFLEX) 4 MG tablet, Take 1 tablet (4 mg total) by mouth 3 (three) times daily., Disp: 30 tablet, Rfl: 0 .  topiramate (TOPAMAX) 25 MG tablet, Take 1 tablet (25 mg total) by mouth daily., Disp: 90 tablet, Rfl: 3 .  Triamcinolone Acetonide (NASACORT ALLERGY 24HR NA), Place 1-2 sprays into both nostrils daily as needed (allergies)., Disp: , Rfl:  .  Vitamin D, Cholecalciferol, 1000 UNITS CAPS, Take 1,000 Units by mouth daily., Disp: , Rfl:  .  albuterol (PROVENTIL) (2.5 MG/3ML) 0.083% nebulizer solution, Take 3 mLs (2.5 mg total) by nebulization every 6 (six) hours as needed for wheezing or shortness of breath., Disp: 150 mL, Rfl: 1 .  cetirizine (ZYRTEC) 10 MG chewable tablet, Chew 10 mg by mouth daily as needed for allergies., Disp: , Rfl:  .  montelukast (SINGULAIR) 10 MG tablet, Take 1 tablet (10 mg total) by mouth at bedtime. (Patient not  taking: Reported on 12/23/2020), Disp: 90 tablet, Rfl: 3   Exam: Current vital signs: BP (!) 105/48   Pulse 73   Temp 98.4 F (36.9 C) (Oral)   Resp (!) 22   SpO2 95%  Vital signs in last 24 hours: Temp:  [98.4 F (36.9 C)] 98.4 F (36.9 C) (01/25 1343) Pulse Rate:  [56-87] 73 (01/26 1011) Resp:  [10-22] 22 (01/26 1011) BP: (104-137)/(48-84) 105/48 (01/26 1011) SpO2:  [89 %-98 %] 95 % (01/26 1011) GENERAL: Awake, alert in NAD HEENT: - Normocephalic and atraumatic, dry mm, no LN++, no Thyromegally LUNGS - Clear to auscultation bilaterally with no wheezes CV - S1S2 RRR, no m/r/g, equal pulses bilaterally. ABDOMEN - Soft, nontender, nondistended with normoactive BS Ext: warm, well perfused, intact peripheral pulses, no edema  NEURO:  Mental Status: AA&Ox3  Language: speech is mildly dysarthric.  Naming, repetition, fluency, and comprehension  intact. Cranial Nerves: PERRL. EOMI, visual fields full, very subtle right  lower facial asymmetry at rest, facial sensation intact, hearing intact, tongue/uvula/soft palate midline, normal sternocleidomastoid and trapezius muscle strength. No evidence of tongue atrophy or fibrillations Motor: Right upper extremity at the shoulder is 0/5, at the elbow is 2/5, and again 0/5 at the wrist flexors extensors and fingers.  Right lower extremity is at least a 4 out of 5 with mild drift.  Left upper and lower extremity are full strength. Tone: is normal and bulk is normal Sensation- Intact to light touch bilaterally Coordination: FTN intact on the left.  Unable to perform on the right Gait- deferred 1a Level of Conscious.: 0 1b LOC Questions: 0 1c LOC Commands: 0 2 Best Gaze: 0 3 Visual: 0 4 Facial Palsy: 1 5a Motor Arm - left: 0 5b Motor Arm - Right: 3 6a Motor Leg - Left: 0 6b Motor Leg - Right: 1 7 Limb Ataxia: 0 8 Sensory: 0 9 Best Language: 0 10 Dysarthria: 1 11 Extinct. and Inatten.: 0 TOTAL: 6   Labs I have reviewed labs in epic  and the results pertinent to this consultation are:  CBC    Component Value Date/Time   WBC 7.2 12/23/2020 0909   RBC 4.57 12/23/2020 0909   HGB 13.7 12/23/2020 0909   HGB 14.2 09/17/2020 0915   HCT 40.7 12/23/2020 0909   HCT 41.4 09/17/2020 0915   PLT 275 12/23/2020 0909   PLT 300 09/17/2020 0915   MCV 89.1 12/23/2020 0909   MCV 91 09/17/2020 0915   MCH 30.0 12/23/2020 0909   MCHC 33.7 12/23/2020 0909   RDW 13.0 12/23/2020 0909   RDW 13.4 09/17/2020 0915   LYMPHSABS 2.7 12/23/2020 0909   LYMPHSABS 2.5 09/17/2020 0915   MONOABS 0.4 12/23/2020 0909   EOSABS 0.2 12/23/2020 0909   EOSABS 0.1 09/17/2020 0915   BASOSABS 0.0 12/23/2020 0909   BASOSABS 0.0 09/17/2020 0915    CMP     Component Value Date/Time   NA 140 12/23/2020 0909   NA 140 09/17/2020 0915   K 3.7 12/23/2020 0909   CL 103 12/23/2020 0909   CO2 26 12/23/2020 0909   GLUCOSE 134 (H) 12/23/2020 0909   BUN 20 12/23/2020 0909   BUN 20 09/17/2020 0915   CREATININE 0.78 12/23/2020 0909   CALCIUM 9.3 12/23/2020 0909   PROT 7.1 12/23/2020 0909   PROT 6.7 09/17/2020 0915   ALBUMIN 4.2 12/23/2020 0909   ALBUMIN 4.5 09/17/2020 0915   AST 21 12/23/2020 0909   ALT 16 12/23/2020 0909   ALKPHOS 56 12/23/2020 0909   BILITOT 0.8 12/23/2020 0909   BILITOT 0.3 09/17/2020 0915   GFRNONAA >60 12/23/2020 0909   GFRAA 73 09/17/2020 0915    Lipid Panel     Component Value Date/Time   CHOL 168 12/24/2020 0412   CHOL 245 (H) 09/17/2020 0915   TRIG 70 12/24/2020 0412   HDL 68 12/24/2020 0412   HDL 70 09/17/2020 0915   CHOLHDL 2.5 12/24/2020 0412   VLDL 14 12/24/2020 0412   LDLCALC 86 12/24/2020 0412   LDLCALC 161 (H) 09/17/2020 0915   A1c 5.4  2D echocardiogram IMPRESSIONS    1. Left ventricular ejection fraction, by estimation, is 60 to 65%. The  left ventricle has normal function. The left ventricle has no regional  wall motion abnormalities. Left ventricular diastolic parameters were  normal.  2. Right  ventricular systolic function is normal. The right ventricular  size is normal.  3. The mitral valve is normal in structure. Trivial mitral valve  regurgitation.  4. The aortic valve is normal in structure. Aortic valve regurgitation is  trivial.   Imaging I have reviewed the images obtained:  CT-scan of the brain-no bleed.  No acute changes  MRI examination of the brain December 23, 2020 when compared to the one from 12/19/2020 with increase in size of the paramedian left pontine infarction.  Stable mild cerebral white matter disease.  Redemonstrated small chronic left cerebellar infarct.  MRI angio head from 12/19/2020-No intracranial large vessel occlusion or proximal high-grade arterial stenosis. 1 to 2 mm inferiorly projecting vascular protrusion from the paraclinoid right ICA which may reflect an infundibulum or a small aneurysm  Assessment: 60 year old woman with above past medical history, who had a small lacunar left pontine infarct 3 days ago presents for worsening of the right-sided weakness symptoms.  Initially symptoms resolved but recurred again. Not a candidate for TPA due to the recent stroke. Exam not indicative of LVO hence no intervention offered.  Before her discharge on 12/20/2020-already started on dual antiplatelets for 3 weeks. Also statin for LDL goal of less than 70. At this point, I do not think there is much that needs to be done differently.  Her clinical presentation consistent with a fluctuating presentation that is seen with lacunar infarct sometimes where they get worse before the patient symptoms improve and I suspect that is what happened-she was much better the day after the stroke but then a couple of days later had major worsening.  Mainstay of treatment will be rehabilitation.  Telemetry has not been revealing for atrial fibrillation and the location of the stroke does not seem to be consistent with an embolic phenomenon.  Recommendations: No need to  repeat 2D echo I will get a CTA head and neck to evaluate the posterior circulation better. Continue dual antiplatelets Continue statin PT OT speech therapy I will also recommend maintaining permissive hypertension and keeping systolic blood pressures above 140. IV fluids can be initiated.  I do not think we need to start peripheral pressors if the IV fluids do not raise her pressures higher. I would avoid hypotension at all times. Mainstay of the treatment will be rehabilitation.  She would benefit from CIR according to PT evaluations and I would start that as soon as possible.  We will follow up on the imaging findings with you.  Discussed my plan with Dr. Blake Divine. -- Milon Dikes, MD Neurologist [facility] Pager: [phone]

## 2020-12-24 NOTE — ED Notes (Signed)
Secure chat sent to Dr Blake Divine for PRN zofran per pt's request

## 2020-12-24 NOTE — Progress Notes (Signed)
SLP Cancellation Note  Patient Details Name: [name] MRN: [id number] DOB: 01/15/1961   Cancelled treatment:       Reason Eval/Treat Not Completed: SLP screened, no needs identified, will sign off  Chart review, nursing consulted and met with pt. She doesn't identify any acute deficits. Education provided on requesting Outpatient ST services from PCP should she notice any deficits when discharged.   Niklas Chretien B. Rutherford Nail M.S., CCC-SLP, [place] Office [phone]   [place] Rutherford Nail 12/24/2020, 10:40 AM

## 2020-12-24 NOTE — ED Notes (Signed)
Pt sleeping. 

## 2020-12-24 NOTE — ED Notes (Signed)
Report to michelle rn c pod nurse   Pt to room 38  Pt alert

## 2020-12-24 NOTE — ED Notes (Signed)
Assisted husband in giving pt a bed bath with wipes- pt states feeling better

## 2020-12-24 NOTE — Progress Notes (Signed)
PROGRESS NOTE    [name]  KXF:[id number] DOB: [birth year] DOA: 12/23/2020 PCP: Reubin Milan, MD    Chief Complaint  Patient presents with  . Weakness    Brief Narrative: 60 year old lady with prior history of recent pontine stroke, migraine headaches presents to ED for worsening right-sided weakness involving the right upper and lower extremity and slurred speech.  MRI of the brain shows 1.2 x 0.7 cm acute/early subacute infarct within the paramedian left pons, increased in size from the brain MRI of 12/19/2020 (previously 1.1 x 0.2 cm),.  Neurology consulted and is on board.  Patient seen this morning reports her right lower extremity weakness has improved.   Assessment & Plan:   Principal Problem:   Acute CVA (cerebrovascular accident) (HCC) Active Problems:   Obesity, Class I, BMI 30-34.9   History of migraine headaches    Acute infarct within the left pons increased in size Presents to ED with increased right-sided weakness and dysarthria which have completely resolved. Currently resume aspirin and plavix and statin Neurology on board. .  Therapy evaluations recommending CIR. Will consult CIR.  Recent 2D echocardiogram showed left ventricular ejection fraction of 60 to 65% with no evidence of cardiac thrombus.   Migraine headaches; Prn pain control.    Depression Continue with Celexa.    DVT prophylaxis: (Lovenox) Code Status: (Fullcode Family Communication: none at bedside.  Disposition:   Status is: Inpatient  Remains inpatient appropriate because:Ongoing diagnostic testing needed not appropriate for outpatient work up   Dispo: The patient is from: Home              Anticipated d/c is to: Home              Anticipated d/c date is: 1 day              Patient currently is not medically stable to d/c.   Difficult to place patient No       Consultants:   Neurology.  Procedures: MRI brain showed 1.2 x 0.7 cm acute/early subacute infarct  within the paramedian left  pons, increased in size from the brain MRI of 12/19/2020    Antimicrobials: none.    Subjective: No chest pain or sob, improving left leg strength.   Objective: Vitals:   12/24/20 0400 12/24/20 0600 12/24/20 0800 12/24/20 1011  BP: 128/70 127/62 125/77 (!) 105/48  Pulse: (!) 59 (!) 56 72 73  Resp: 12 13 14  (!) 22  Temp:      TempSrc:      SpO2: 95% 92% 94% 95%    Intake/Output Summary (Last 24 hours) at 12/24/2020 1251 Last data filed at 12/24/2020 0419 Gross per 24 hour  Intake --  Output 200 ml  Net -200 ml   There were no vitals filed for this visit.  Examination:  General exam: Appears calm and comfortable  Respiratory system: Clear to auscultation. Respiratory effort normal. Cardiovascular system: S1 & S2 heard, RRR. No JVD, . No pedal edema. Gastrointestinal system: Abdomen is nondistended, soft and nontender.  Normal bowel sounds heard. Central nervous system: right lower extremity strength 4/5, no speech abnormalities.  Extremities: Symmetric 5 x 5 power. Skin: No rashes, lesions or ulcers Psychiatry: Mood & affect appropriate.     Data Reviewed: I have personally reviewed following labs and imaging studies  CBC: Recent Labs  Lab 12/19/20 0934 12/23/20 0909  WBC 6.2 7.2  NEUTROABS 3.4 4.0  HGB 14.1 13.7  HCT 42.5 40.7  MCV 91.0  89.1  PLT 267 275    Basic Metabolic Panel: Recent Labs  Lab 12/19/20 0934 12/23/20 0909  NA 140 140  K 3.6 3.7  CL 104 103  CO2 26 26  GLUCOSE 114* 134*  BUN 22* 20  CREATININE 0.97 0.78  CALCIUM 9.0 9.3    GFR: Estimated Creatinine Clearance: 77.3 mL/min (by C-G formula based on SCr of 0.78 mg/dL).  Liver Function Tests: Recent Labs  Lab 12/19/20 0934 12/23/20 0909  AST 20 21  ALT 15 16  ALKPHOS 51 56  BILITOT 0.9 0.8  PROT 7.2 7.1  ALBUMIN 4.2 4.2    CBG: Recent Labs  Lab 12/23/20 0905  GLUCAP 129*     Recent Results (from the past 240 hour(s))  SARS  Coronavirus 2 by RT PCR (hospital order, performed in [facility] hospital lab) Nasopharyngeal Nasopharyngeal Swab     Status: None   Collection Time: 12/19/20  9:35 AM   Specimen: Nasopharyngeal Swab  Result Value Ref Range Status   SARS Coronavirus 2 NEGATIVE NEGATIVE Final    Comment: (NOTE) SARS-CoV-2 target nucleic acids are NOT DETECTED.  The SARS-CoV-2 RNA is generally detectable in upper and lower respiratory specimens during the acute phase of infection. The lowest concentration of SARS-CoV-2 viral copies this assay can detect is 250 copies / mL. A negative result does not preclude SARS-CoV-2 infection and should not be used as the sole basis for treatment or other patient management decisions.  A negative result may occur with improper specimen collection / handling, submission of specimen other than nasopharyngeal swab, presence of viral mutation(s) within the areas targeted by this assay, and inadequate number of viral copies (<250 copies / mL). A negative result must be combined with clinical observations, patient history, and epidemiological information.  Fact Sheet for Patients:   BoilerBrush.com.cy  Fact Sheet for Healthcare Providers: https://pope.com/  This test is not yet approved or  cleared by the [place] FDA and has been authorized for detection and/or diagnosis of SARS-CoV-2 by FDA under an Emergency Use Authorization (EUA).  This EUA will remain in effect (meaning this test can be used) for the duration of the COVID-19 declaration under Section 564(b)(1) of the Act, 21 U.S.C. section 360bbb-3(b)(1), unless the authorization is terminated or revoked sooner.  Performed at [facility], [address] [place], [place] [address]   SARS CORONAVIRUS 2 (TAT 6-24 HRS) Nasopharyngeal Nasopharyngeal Swab     Status: None   Collection Time: 12/23/20  3:38 PM   Specimen: Nasopharyngeal Swab  Result  Value Ref Range Status   SARS Coronavirus 2 NEGATIVE NEGATIVE Final    Comment: (NOTE) SARS-CoV-2 target nucleic acids are NOT DETECTED.  The SARS-CoV-2 RNA is generally detectable in upper and lower respiratory specimens during the acute phase of infection. Negative results do not preclude SARS-CoV-2 infection, do not rule out co-infections with other pathogens, and should not be used as the sole basis for treatment or other patient management decisions. Negative results must be combined with clinical observations, patient history, and epidemiological information. The expected result is Negative.  Fact Sheet for Patients: HairSlick.no  Fact Sheet for Healthcare Providers: quierodirigir.com  This test is not yet approved or cleared by the [place] FDA and  has been authorized for detection and/or diagnosis of SARS-CoV-2 by FDA under an Emergency Use Authorization (EUA). This EUA will remain  in effect (meaning this test can be used) for the duration of the COVID-19 declaration under Se ction 564(b)(1)  of the Act, 21 U.S.C. section 360bbb-3(b)(1), unless the authorization is terminated or revoked sooner.  Performed at [facility], [address] [place], [place] [address]          Radiology Studies: DG Chest 2 View  Result Date: 12/23/2020 CLINICAL DATA:  Right-sided weakness following previous TI EXAM: CHEST - 2 VIEW COMPARISON:  None. FINDINGS: The heart size and mediastinal contours are within normal limits. Both lungs are clear. The visualized skeletal structures are unremarkable. IMPRESSION: No active cardiopulmonary disease. Electronically Signed   By: Alcide Clever M.D.   On: 12/23/2020 16:39   CT HEAD WO CONTRAST  Result Date: 12/23/2020 CLINICAL DATA:  Right-sided weakness and slurred speech EXAM: CT HEAD WITHOUT CONTRAST TECHNIQUE: Contiguous axial images were obtained from the base of the skull  through the vertex without intravenous contrast. COMPARISON:  None. FINDINGS: Brain: There is no acute intracranial hemorrhage, mass effect, or edema. Gray-white differentiation is preserved. There is no extra-axial fluid collection. Ventricles and sulci are within normal limits in size and configuration. Small chronic infarct of the left cerebellum. Vascular: No hyperdense vessel. Skull: Calvarium is unremarkable. Sinuses/Orbits: No acute finding. Other: None. IMPRESSION: No acute intracranial hemorrhage or evidence of acute infarction. Small chronic left cerebellar infarct. Electronically Signed   By: Guadlupe Spanish M.D.   On: 12/23/2020 09:56   MR BRAIN WO CONTRAST  Result Date: 12/23/2020 CLINICAL DATA:  Neuro deficit, acute, stroke suspected. EXAM: MRI HEAD WITHOUT CONTRAST TECHNIQUE: Multiplanar, multiecho pulse sequences of the brain and surrounding structures were obtained without intravenous contrast. COMPARISON:  Head CT 12/23/2020. FINDINGS: Brain: Cerebral volume is normal. There has been interval increase in size of an acute/early subacute infarct within the paramedian left pons as compared to the brain MRI of 12/19/2020. This now measures 1.2 x 0.7 cm in transaxial dimensions (previously 1.1 x 0.2 cm in transaxial dimensions). A few small scattered foci of T2/FLAIR hyperintensity within the cerebral white matter are nonspecific, but compatible with chronic small vessel ischemic disease. Redemonstrated small chronic infarct within the left cerebellum. No evidence of intracranial mass. No chronic intracranial blood products. No extra-axial fluid collection. No midline shift. Vascular: Expected proximal arterial flow voids. Non dominant intracranial left vertebral artery. Skull and upper cervical spine: No focal marrow lesion. Sinuses/Orbits: Visualized orbits show no acute finding. Trace ethmoid sinus mucosal thickening. Small right maxillary sinus mucous retention cyst. IMPRESSION: 1.2 x 0.7 cm  acute/early subacute infarct within the paramedian left pons, increased in size from the brain MRI of 12/19/2020 (previously 1.1 x 0.2 cm). Stable mild cerebral white matter chronic small vessel ischemic disease. Redemonstrated small chronic left cerebellar infarct. Electronically Signed   By: Jackey Loge DO   On: 12/23/2020 20:31        Scheduled Meds: .  stroke: mapping our early stages of recovery book   Does not apply Once  . aspirin EC  81 mg Oral Daily  . atorvastatin  80 mg Oral QHS  . cholecalciferol  1,000 Units Oral Daily  . citalopram  20 mg Oral Daily  . clopidogrel  75 mg Oral Daily  . loratadine  10 mg Oral Daily  . montelukast  10 mg Oral QHS  . multivitamin with minerals  1 tablet Oral Daily  . topiramate  25 mg Oral Daily  . vitamin B-12  500 mcg Oral Daily   Continuous Infusions:   LOS: 1 day       Kathlen Mody, MD [facility]  To contact the attending provider between 7A-7P or the covering provider during after hours 7P-7A, please log into the web site [email or web address] and access using universal [place] password for that web site. If you do not have the password, please call the hospital operator.  12/24/2020, 12:51 PM

## 2020-12-24 NOTE — ED Notes (Signed)
Pt ambulated with one assist to restroom 

## 2020-12-24 NOTE — Evaluation (Signed)
Physical Therapy Evaluation Patient Details Name: [name] MRN: [id number] DOB: 1961-03-22 Today's Date: 12/24/2020   History of Present Illness  Pt is a 60 y.o. female presenting to hospital 1/25 with R sided weakness and difficulty with speech; recent admission with small L pontine stroke (weakness R arm and speech difficulties) but symptoms resolved by time of discharge 12/20/20.  Pt now admitted to hospital for stroke work-up.  MRI showing 1.2 x 0.7 cm acute/early subacute infarct w/in the paramedian L pons (increased in size from brain MRI 12/19/20).  PMH includes migraines, HLD, stroke, L RCR, and repair of ankle ligament.  Clinical Impression  Prior to hospital admission, pt was independent with ambulation; lives with her husband (who is retired) in 1 level home with 3 STE B railings.  R UE and R LE weakness noted; increased R LE tone also noted.  Currently pt is CGA to min assist with bed mobility and demonstrated good sitting balance (pt then became nauseas and layed back down; pt sat back up again to try mobility but became nauseas again and laid back down in bed to rest; nurse notified of pt's request for nausea medication).  With OT earlier this morning, pt was mod assist with transfers and mod assist walking short distance to toilet (per OT pt's R knee buckled).  Pt would benefit from skilled PT to address noted impairments and functional limitations (see below for any additional details).  Upon hospital discharge, pt would benefit from CIR.    Follow Up Recommendations CIR    Equipment Recommendations  3in1 (PT);Wheelchair (measurements PT);Wheelchair cushion (measurements PT);Cane    Recommendations for Other Services OT consult     Precautions / Restrictions Precautions Precautions: Fall Restrictions Weight Bearing Restrictions: No      Mobility  Bed Mobility Overal bed mobility: Needs Assistance Bed Mobility: Supine to Sit;Sit to Supine     Supine to sit: Min  guard;Min assist (assist for trunk (R sidelying to sitting) x2 trials)) Sit to supine: Min assist (assist for R LE x2 trials)   General bed mobility comments: pt able to talk through technique appropriately; increased time to perform    Transfers       General transfer comment: Deferred d/t pt became nauseas and unable to perform mobility  Ambulation/Gait                Stairs            Wheelchair Mobility    Modified Rankin (Stroke Patients Only)       Balance Overall balance assessment: Needs assistance Sitting-balance support: Feet unsupported;Single extremity supported Sitting balance-Leahy Scale: Good Sitting balance - Comments: steady sitting reaching within BOS   Standing balance support: During functional activity Standing balance-Leahy Scale: Poor                               Pertinent Vitals/Pain Pain Assessment: No/denies pain Pain Score: 3  Pain Location: R shoulder pain (has had since December moving boxes) Pain Descriptors / Indicators: Aching;Sore Pain Intervention(s): Limited activity within patient's tolerance;Monitored during session;Repositioned  Vitals (HR and O2 on room air) stable and WFL throughout treatment session.    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     Hand Dominance   Dominant Hand: Right    Extremity/Trunk Assessment   Upper Extremity Assessment Upper Extremity Assessment: Defer to OT evaluation;RUE deficits/detail RUE Deficits / Details: per  OT eval "2-/5 in gravity eliminated position"; 0/5 R grip strength/finger flexion and finger extension RUE Sensation: WNL RUE Coordination: decreased fine motor;decreased gross motor    Lower Extremity Assessment Lower Extremity Assessment: RLE deficits/detail (L LE WFL; intact B LE proprioception and light touch) RLE Deficits / Details: able to perform SLR with decreased quality; hip flexion 2+/5; knee extension 2+/5; knee flexion 3-/5; DF 0/5 (able to slightly wiggle toes); PF 2+/5; increased tone noted R LE; R DF to neutral PROM (limited d/t increased R LE tone); decreased R LE heel to shin coordination RLE Coordination: decreased gross motor;decreased fine motor    Cervical / Trunk Assessment Cervical / Trunk Assessment: Normal  Communication   Communication: No difficulties  Cognition Arousal/Alertness: Awake/alert Behavior During Therapy: WFL for tasks assessed/performed Overall Cognitive Status: Within Functional Limits for tasks assessed                                        General Comments   Nursing cleared pt for participation in physical therapy.  Pt agreeable to PT session.    Exercises General Exercises - Lower Extremity Long Arc Quad: AAROM;Strengthening;Right;10 reps;Seated   Assessment/Plan    PT Assessment Patient needs continued PT services  PT Problem List Decreased strength;Decreased activity tolerance;Decreased balance;Decreased mobility;Decreased coordination;Decreased knowledge of use of DME;Decreased knowledge of precautions;Impaired tone       PT Treatment Interventions DME instruction;Gait training;Stair training;Functional mobility training;Therapeutic activities;Therapeutic exercise;Balance training;Patient/family education;Neuromuscular re-education    PT Goals (Current goals can be found in the Care Plan section)  Acute Rehab PT Goals Patient Stated Goal: to be able to move my right side again so I can work PT Goal Formulation:  With patient Time For Goal Achievement: 01/07/21 Potential to Achieve Goals: Good    Frequency 7X/week   Barriers to discharge        Co-evaluation               AM-PAC PT "6 Clicks" Mobility  Outcome Measure Help needed turning from your back to your side while in a flat bed without using bedrails?: None Help needed moving from lying on your back to sitting on the side of a flat bed without using bedrails?: A Little Help needed moving to and from a bed to a chair (including a wheelchair)?: A Lot Help needed standing up from a chair using your arms (e.g., wheelchair or bedside chair)?: A Little Help needed to walk in hospital room?: A Lot Help needed climbing 3-5 steps with a railing? : Total 6 Click Score: 15    End of Session Equipment Utilized During Treatment: Gait belt Activity Tolerance: Other (comment) (Limited d/t nausea) Patient left: in bed;with call bell/phone within reach;Other (comment) (ED stretcher bed in lowest position; B railings up) Nurse Communication: Mobility status;Precautions;Other (comment) (pt's nausea) PT Visit Diagnosis: Other abnormalities of gait and mobility (R26.89);Other symptoms and signs involving the nervous system (R29.898);Hemiplegia and hemiparesis Hemiplegia - Right/Left: Right Hemiplegia - dominant/non-dominant: Dominant Hemiplegia - caused by: Cerebral  infarction    Time: 2330-0762 PT Time Calculation (min) (ACUTE ONLY): 35 min   Charges:   PT Evaluation $PT Eval Low Complexity: 1 Low PT Treatments $Therapeutic Activity: 8-22 mins       Hendricks Limes, PT 12/24/20, 1:58 PM

## 2020-12-25 DIAGNOSIS — R531 Weakness: Secondary | ICD-10-CM

## 2020-12-25 MED ORDER — TRAMADOL HCL 50 MG PO TABS
50.0000 mg | ORAL_TABLET | ORAL | Status: DC | PRN
  Administered 2020-12-25 – 2020-12-26 (×5): 50 mg via ORAL
  Filled 2020-12-25 (×5): qty 1

## 2020-12-25 MED ORDER — SODIUM CHLORIDE 0.9 % IV BOLUS
1000.0000 mL | Freq: Once | INTRAVENOUS | Status: AC
  Administered 2020-12-25: 1000 mL via INTRAVENOUS

## 2020-12-25 NOTE — Progress Notes (Signed)
PROGRESS NOTE    [name]  JZP:[id number] DOB: Mar 31, 1961 DOA: 12/23/2020 PCP: Reubin Milan, MD    Chief Complaint  Patient presents with  . Weakness    Brief Narrative: 60 year old lady with prior history of recent pontine stroke, migraine headaches presents to ED for worsening right-sided weakness involving the right upper and lower extremity and slurred speech.  MRI of the brain shows 1.2 x 0.7 cm acute/early subacute infarct within the paramedian left pons, increased in size from the brain MRI of 12/19/2020 (previously 1.1 x 0.2 cm),.  Neurology consulted and is on board.  Patient seen this morning reports her right lower extremity weakness has improved.   Assessment & Plan:   Principal Problem:   Acute CVA (cerebrovascular accident) (HCC) Active Problems:   Obesity, Class I, BMI 30-34.9   History of migraine headaches    Acute infarct within the left pons increased in size Presents to ED with increased right-sided weakness and dysarthria which have completely resolved. Currently resume aspirin and plavix and statin Neurology on board. .  Therapy evaluations recommending CIR. consulted CIR.  Patient request to see if she can go home with outpatient PT and OT.  TOC on board and assisting. Recent 2D echocardiogram showed left ventricular ejection fraction of 60 to 65% with no evidence of cardiac thrombus. Patient reports her lower extremity strength has improved and is close to baseline. Right upper extremity strength has improved from 0-2 out of 5.   Migraine headaches; Prn pain control.    Depression Continue with Celexa.    DVT prophylaxis: (Lovenox) Code Status: (Fullcode Family Communication: Family at bedside Disposition:   Status is: Inpatient  Remains inpatient appropriate because:Ongoing diagnostic testing needed not appropriate for outpatient work up   Dispo: The patient is from: Home              Anticipated d/c is to: CIR               Anticipated d/c date is: 1 day              Patient currently is not medically stable to d/c.   Difficult to place patient No       Consultants:   Neurology.  Procedures: MRI brain showed 1.2 x 0.7 cm acute/early subacute infarct within the paramedian left  pons, increased in size from the brain MRI of 12/19/2020    Antimicrobials: none.    Subjective: No new complaints, right upper extremity strength is improving  Objective: Vitals:   12/25/20 0324 12/25/20 1017 12/25/20 1018 12/25/20 1215  BP: 127/67 (!) 116/58 (!) 115/59 (!) 115/48  Pulse: 63 67 63 67  Resp: 19 18  18   Temp: 98.4 F (36.9 C)     TempSrc: Oral     SpO2: 98% 97%  96%  Weight:        Intake/Output Summary (Last 24 hours) at 12/25/2020 1603 Last data filed at 12/25/2020 1350 Gross per 24 hour  Intake 2534.24 ml  Output 2350 ml  Net 184.24 ml   Filed Weights   12/24/20 1727  Weight: 78 kg    Examination:  General exam: Alert and comfortable  respiratory system: Air entry fair bilaterally no wheezing or rhonchi Cardiovascular system: S1-S2 heard, regular rate rhythm, no JVD Gastrointestinal system: Abdomen is soft, nontender, bowel sounds are normal Central nervous system: right lower extremity strength 4/5, no speech abnormalities, Right upper extremity strength has improved from 0-2 out of 5. Extremities: No pedal  edema Skin: No rashes seen Psychiatry: Mood is appropriate    Data Reviewed: I have personally reviewed following labs and imaging studies  CBC: Recent Labs  Lab 12/19/20 0934 12/23/20 0909  WBC 6.2 7.2  NEUTROABS 3.4 4.0  HGB 14.1 13.7  HCT 42.5 40.7  MCV 91.0 89.1  PLT 267 275    Basic Metabolic Panel: Recent Labs  Lab 12/19/20 0934 12/23/20 0909  NA 140 140  K 3.6 3.7  CL 104 103  CO2 26 26  GLUCOSE 114* 134*  BUN 22* 20  CREATININE 0.97 0.78  CALCIUM 9.0 9.3    GFR: Estimated Creatinine Clearance: 75.6 mL/min (by C-G formula based on SCr of 0.78  mg/dL).  Liver Function Tests: Recent Labs  Lab 12/19/20 0934 12/23/20 0909  AST 20 21  ALT 15 16  ALKPHOS 51 56  BILITOT 0.9 0.8  PROT 7.2 7.1  ALBUMIN 4.2 4.2    CBG: Recent Labs  Lab 12/23/20 0905  GLUCAP 129*     Recent Results (from the past 240 hour(s))  SARS Coronavirus 2 by RT PCR (hospital order, performed in [facility] hospital lab) Nasopharyngeal Nasopharyngeal Swab     Status: None   Collection Time: 12/19/20  9:35 AM   Specimen: Nasopharyngeal Swab  Result Value Ref Range Status   SARS Coronavirus 2 NEGATIVE NEGATIVE Final    Comment: (NOTE) SARS-CoV-2 target nucleic acids are NOT DETECTED.  The SARS-CoV-2 RNA is generally detectable in upper and lower respiratory specimens during the acute phase of infection. The lowest concentration of SARS-CoV-2 viral copies this assay can detect is 250 copies / mL. A negative result does not preclude SARS-CoV-2 infection and should not be used as the sole basis for treatment or other patient management decisions.  A negative result may occur with improper specimen collection / handling, submission of specimen other than nasopharyngeal swab, presence of viral mutation(s) within the areas targeted by this assay, and inadequate number of viral copies (<250 copies / mL). A negative result must be combined with clinical observations, patient history, and epidemiological information.  Fact Sheet for Patients:   BoilerBrush.com.cy  Fact Sheet for Healthcare Providers: https://pope.com/  This test is not yet approved or  cleared by the [place] FDA and has been authorized for detection and/or diagnosis of SARS-CoV-2 by FDA under an Emergency Use Authorization (EUA).  This EUA will remain in effect (meaning this test can be used) for the duration of the COVID-19 declaration under Section 564(b)(1) of the Act, 21 U.S.C. section 360bbb-3(b)(1), unless the authorization  is terminated or revoked sooner.  Performed at [facility], [address] [place], [place] [address]   SARS CORONAVIRUS 2 (TAT 6-24 HRS) Nasopharyngeal Nasopharyngeal Swab     Status: None   Collection Time: 12/23/20  3:38 PM   Specimen: Nasopharyngeal Swab  Result Value Ref Range Status   SARS Coronavirus 2 NEGATIVE NEGATIVE Final    Comment: (NOTE) SARS-CoV-2 target nucleic acids are NOT DETECTED.  The SARS-CoV-2 RNA is generally detectable in upper and lower respiratory specimens during the acute phase of infection. Negative results do not preclude SARS-CoV-2 infection, do not rule out co-infections with other pathogens, and should not be used as the sole basis for treatment or other patient management decisions. Negative results must be combined with clinical observations, patient history, and epidemiological information. The expected result is Negative.  Fact Sheet for Patients: HairSlick.no  Fact Sheet for Healthcare Providers: quierodirigir.com  This test is not  yet approved or cleared by the [place] and  has been authorized for detection and/or diagnosis of SARS-CoV-2 by FDA under an Emergency Use Authorization (EUA). This EUA will remain  in effect (meaning this test can be used) for the duration of the COVID-19 declaration under Se ction 564(b)(1) of the Act, 21 U.S.C. section 360bbb-3(b)(1), unless the authorization is terminated or revoked sooner.  Performed at [facility], [address] [place], [place] [address]          Radiology Studies: CT ANGIO HEAD W OR WO CONTRAST  Result Date: 12/24/2020 CLINICAL DATA:  Stroke.  Right-sided weakness. EXAM: CT ANGIOGRAPHY HEAD AND NECK TECHNIQUE: Multidetector CT imaging of the head and neck was performed using the standard protocol during bolus administration of intravenous contrast. Multiplanar CT image reconstructions and MIPs  were obtained to evaluate the vascular anatomy. Carotid stenosis measurements (when applicable) are obtained utilizing NASCET criteria, using the distal internal carotid diameter as the denominator. CONTRAST:  4mL OMNIPAQUE IOHEXOL 350 MG/ML SOLN COMPARISON:  MRI head 12/23/2020 FINDINGS: CT HEAD FINDINGS Brain: Acute infarct left paracentral pons best seen on MRI. This areas obscured by streak artifact on CT. Ventricle size normal. No acute hemorrhage. No other area of infarction identified. Vascular: Negative for hyperdense vessel Skull: Negative Sinuses: Paranasal sinuses clear. Orbits: Negative Review of the MIP images confirms the above findings CTA NECK FINDINGS Aortic arch: Standard branching. Imaged portion shows no evidence of aneurysm or dissection. No significant stenosis of the major arch vessel origins. Right carotid system: Right carotid widely patent without stenosis or significant atherosclerotic disease. Left carotid system: Left carotid widely patent without stenosis or significant atherosclerotic disease Vertebral arteries: Left vertebral artery dominant and widely patent to the basilar. Small right vertebral artery ends in PICA with minimal contribution to the basilar. Skeleton: Cervical spondylosis.  No acute skeletal abnormality. Other neck: Negative for mass or adenopathy. Upper chest: Lung apices clear bilaterally. Review of the MIP images confirms the above findings CTA HEAD FINDINGS Anterior circulation: Internal carotid artery widely patent through the skull base and cavernous segments. Anterior and middle cerebral arteries widely patent. Negative for large vessel occlusion or flow limiting stenosis. Posterior circulation: PICA patent bilaterally. Basilar supplied primarily by the left vertebral artery. Left AICA patent. Basilar widely patent. Superior cerebellar and posterior cerebral arteries patent bilaterally without stenosis. No significant atherosclerotic disease in the basilar  artery. Venous sinuses: Normal venous enhancement Anatomic variants: None Review of the MIP images confirms the above findings IMPRESSION: 1. Acute infarct left paracentral pons best seen on MRI yesterday. No acute hemorrhage on CT. 2. Negative CT angio head neck. No carotid or vertebral artery stenosis in the neck. 3. Normal basilar artery without atherosclerotic disease or stenosis. Electronically Signed   By: Marlan Palau M.D.   On: 12/24/2020 15:52   DG Chest 2 View  Result Date: 12/23/2020 CLINICAL DATA:  Right-sided weakness following previous TI EXAM: CHEST - 2 VIEW COMPARISON:  None. FINDINGS: The heart size and mediastinal contours are within normal limits. Both lungs are clear. The visualized skeletal structures are unremarkable. IMPRESSION: No active cardiopulmonary disease. Electronically Signed   By: Alcide Clever M.D.   On: 12/23/2020 16:39   CT ANGIO NECK W OR WO CONTRAST  Result Date: 12/24/2020 CLINICAL DATA:  Stroke.  Right-sided weakness. EXAM: CT ANGIOGRAPHY HEAD AND NECK TECHNIQUE: Multidetector CT imaging of the head and neck was performed using the standard protocol during bolus administration of intravenous  contrast. Multiplanar CT image reconstructions and MIPs were obtained to evaluate the vascular anatomy. Carotid stenosis measurements (when applicable) are obtained utilizing NASCET criteria, using the distal internal carotid diameter as the denominator. CONTRAST:  75mL OMNIPAQUE IOHEXOL 350 MG/ML SOLN COMPARISON:  MRI head 12/23/2020 FINDINGS: CT HEAD FINDINGS Brain: Acute infarct left paracentral pons best seen on MRI. This areas obscured by streak artifact on CT. Ventricle size normal. No acute hemorrhage. No other area of infarction identified. Vascular: Negative for hyperdense vessel Skull: Negative Sinuses: Paranasal sinuses clear. Orbits: Negative Review of the MIP images confirms the above findings CTA NECK FINDINGS Aortic arch: Standard branching. Imaged portion shows no  evidence of aneurysm or dissection. No significant stenosis of the major arch vessel origins. Right carotid system: Right carotid widely patent without stenosis or significant atherosclerotic disease. Left carotid system: Left carotid widely patent without stenosis or significant atherosclerotic disease Vertebral arteries: Left vertebral artery dominant and widely patent to the basilar. Small right vertebral artery ends in PICA with minimal contribution to the basilar. Skeleton: Cervical spondylosis.  No acute skeletal abnormality. Other neck: Negative for mass or adenopathy. Upper chest: Lung apices clear bilaterally. Review of the MIP images confirms the above findings CTA HEAD FINDINGS Anterior circulation: Internal carotid artery widely patent through the skull base and cavernous segments. Anterior and middle cerebral arteries widely patent. Negative for large vessel occlusion or flow limiting stenosis. Posterior circulation: PICA patent bilaterally. Basilar supplied primarily by the left vertebral artery. Left AICA patent. Basilar widely patent. Superior cerebellar and posterior cerebral arteries patent bilaterally without stenosis. No significant atherosclerotic disease in the basilar artery. Venous sinuses: Normal venous enhancement Anatomic variants: None Review of the MIP images confirms the above findings IMPRESSION: 1. Acute infarct left paracentral pons best seen on MRI yesterday. No acute hemorrhage on CT. 2. Negative CT angio head neck. No carotid or vertebral artery stenosis in the neck. 3. Normal basilar artery without atherosclerotic disease or stenosis. Electronically Signed   By: Marlan Palauharles  Clark M.D.   On: 12/24/2020 15:52   MR BRAIN WO CONTRAST  Result Date: 12/23/2020 CLINICAL DATA:  Neuro deficit, acute, stroke suspected. EXAM: MRI HEAD WITHOUT CONTRAST TECHNIQUE: Multiplanar, multiecho pulse sequences of the brain and surrounding structures were obtained without intravenous contrast.  COMPARISON:  Head CT 12/23/2020. FINDINGS: Brain: Cerebral volume is normal. There has been interval increase in size of an acute/early subacute infarct within the paramedian left pons as compared to the brain MRI of 12/19/2020. This now measures 1.2 x 0.7 cm in transaxial dimensions (previously 1.1 x 0.2 cm in transaxial dimensions). A few small scattered foci of T2/FLAIR hyperintensity within the cerebral white matter are nonspecific, but compatible with chronic small vessel ischemic disease. Redemonstrated small chronic infarct within the left cerebellum. No evidence of intracranial mass. No chronic intracranial blood products. No extra-axial fluid collection. No midline shift. Vascular: Expected proximal arterial flow voids. Non dominant intracranial left vertebral artery. Skull and upper cervical spine: No focal marrow lesion. Sinuses/Orbits: Visualized orbits show no acute finding. Trace ethmoid sinus mucosal thickening. Small right maxillary sinus mucous retention cyst. IMPRESSION: 1.2 x 0.7 cm acute/early subacute infarct within the paramedian left pons, increased in size from the brain MRI of 12/19/2020 (previously 1.1 x 0.2 cm). Stable mild cerebral white matter chronic small vessel ischemic disease. Redemonstrated small chronic left cerebellar infarct. Electronically Signed   By: Jackey LogeKyle  Golden DO   On: 12/23/2020 20:31        Scheduled Meds: .  stroke: mapping our early stages of recovery book   Does not apply Once  . aspirin EC  81 mg Oral Daily  . atorvastatin  80 mg Oral QHS  . cholecalciferol  1,000 Units Oral Daily  . citalopram  20 mg Oral Daily  . clopidogrel  75 mg Oral Daily  . loratadine  10 mg Oral Daily  . montelukast  10 mg Oral QHS  . multivitamin with minerals  1 tablet Oral Daily  . topiramate  25 mg Oral Daily  . vitamin B-12  500 mcg Oral Daily   Continuous Infusions: . sodium chloride 75 mL/hr at 12/24/20 2030     LOS: 2 days       Kathlen Mody, MD [facility]   To contact the attending provider between 7A-7P or the covering provider during after hours 7P-7A, please log into the web site [email or web address] and access using universal [place] password for that web site. If you do not have the password, please call the hospital operator.  12/25/2020, 4:03 PM

## 2020-12-25 NOTE — Progress Notes (Signed)
Occupational Therapy Treatment Patient Details Name: [name] MRN: [id number] DOB: 02-Oct-1961 Today's Date: 12/25/2020    History of present illness Pt is a 60 y.o. female presenting to hospital 1/25 with R sided weakness and difficulty with speech; recent admission with small L pontine stroke (weakness R arm and speech difficulties) but symptoms resolved by time of discharge 12/20/20.  Pt now admitted to hospital for stroke work-up.  MRI showing 1.2 x 0.7 cm acute/early subacute infarct w/in the paramedian L pons (increased in size from brain MRI 12/19/20).  PMH includes migraines, HLD, stroke, L RCR, and repair of ankle ligament.   OT comments  Upon entering the room, pt supine in bed with husband present in the room. Pt with no c/o pain and agreeable to OT intervention this session. Pt very motivated. Supine >sit with min A to EOB. Pt standing with mod A and taking several steps to sink without buckling noted. Pt standing and educated on weight bearing through R UE during functional tasks. OT assisted pt into this position while she performed grooming tasks in standing with set up A to obtain all needed items and min guard for balance. Pt then returning to EOB focus on R UE NMR with use of table slides for shoulder flexion, shoulder scaption, and abduction/adduction with assist as needed to decrease compensatory techniques. Pt fatigues and needs rest breaks with this task. OT provided pt with yellow resistive therapy for weight bearing exercise and to provide some feedback to pt about what her body is able to do. Pt returning to bed at end of session to rest with min assist. All needs within reach. OT answered all questions regarding CIR recommendation with husband and pt. Pt continues to be excellent CIR candidate.    Follow Up Recommendations  CIR;Supervision/Assistance - 24 hour    Equipment Recommendations  Other (comment) (defer to next venue of care)       Precautions / Restrictions  Precautions Precautions: Fall Precaution Comments: R hemi Restrictions Weight Bearing Restrictions: No       Mobility Bed Mobility Overal bed mobility: Needs Assistance Bed Mobility: Supine to Sit;Sit to Supine     Supine to sit: Min guard Sit to supine: Min guard   General bed mobility comments: min cuing for technique and hand placement  Transfers Overall transfer level: Needs assistance Equipment used: None Transfers: Sit to/from Stand Sit to Stand: Min assist;Mod assist Stand pivot transfers: Mod assist            Balance Overall balance assessment: Needs assistance Sitting-balance support: Feet unsupported;Single extremity supported Sitting balance-Leahy Scale: Good Sitting balance - Comments: steady sitting reaching within BOS   Standing balance support: During functional activity Standing balance-Leahy Scale: Poor Standing balance comment: High fall risk. RLE weakness (concerns for buckling)                           ADL either performed or assessed with clinical judgement   ADL       Grooming: Oral care;Standing;Min guard;Supervision/safety Grooming Details (indicate cue type and reason): weight bearing through R LE as well                                     Vision Baseline Vision/History: No visual deficits            Cognition Arousal/Alertness: Awake/alert Behavior During Therapy: [facility]  for tasks assessed/performed Overall Cognitive Status: Within Functional Limits for tasks assessed                                 General Comments: Pt is A and O x 4 and extremely motivated.                   Pertinent Vitals/ Pain       Pain Assessment: No/denies pain         Frequency  Min 3X/week        Progress Toward Goals  OT Goals(current goals can now be found in the care plan section)  Progress towards OT goals: Progressing toward goals  Acute Rehab OT Goals Patient Stated Goal: Get  back to normal life OT Goal Formulation: With patient Time For Goal Achievement: 01/07/21 Potential to Achieve Goals: Good  Plan Discharge plan remains appropriate       AM-PAC OT "6 Clicks" Daily Activity     Outcome Measure   Help from another person eating meals?: A Little Help from another person taking care of personal grooming?: A Little Help from another person toileting, which includes using toliet, bedpan, or urinal?: A Lot Help from another person bathing (including washing, rinsing, drying)?: A Lot Help from another person to put on and taking off regular upper body clothing?: A Little Help from another person to put on and taking off regular lower body clothing?: A Lot 6 Click Score: 15    End of Session    OT Visit Diagnosis: Muscle weakness (generalized) (M62.81);Unsteadiness on feet (R26.81);Hemiplegia and hemiparesis Hemiplegia - Right/Left: Right Hemiplegia - dominant/non-dominant: Dominant Hemiplegia - caused by: Cerebral infarction   Activity Tolerance Patient tolerated treatment well   Patient Left in bed;with call bell/phone within reach   Nurse Communication Mobility status        Time: 4098-1191 OT Time Calculation (min): 44 min  Charges: OT General Charges $OT Visit: 1 Visit OT Treatments $Neuromuscular Re-education: 38-52 mins  Jackquline Denmark, MS, OTR/L , CBIS ascom [phone]  12/25/20, 4:49 PM

## 2020-12-25 NOTE — Progress Notes (Signed)
Inpatient Rehabilitation Admissions Coordinator  Inpatient rehab consult for possible CIR admit at [facility] in [place] received. I contacted patient by phone to discuss goals,  expectations and cost of care. She would like to discuss with her physician and spouse. I will follow up tomorrow on possible bed availability this week and clarify patient's preference for rehab . Her medical insurance does not start until 12/30/20.  Ottie Glazier, RN, MSN Rehab Admissions Coordinator [phone] 12/25/2020 12:20 PM

## 2020-12-25 NOTE — Progress Notes (Signed)
Physical Therapy Treatment Patient Details Name: [name] MRN: [id number] DOB: February 27, 1961 Today's Date: 12/25/2020    History of Present Illness Pt is a 60 y.o. female presenting to hospital 1/25 with R sided weakness and difficulty with speech; recent admission with small L pontine stroke (weakness R arm and speech difficulties) but symptoms resolved by time of discharge 12/20/20.  Pt now admitted to hospital for stroke work-up.  MRI showing 1.2 x 0.7 cm acute/early subacute infarct w/in the paramedian L pons (increased in size from brain MRI 12/19/20).  PMH includes migraines, HLD, stroke, L RCR, and repair of ankle ligament.    PT Comments    Pt was awake finishing breakfast upon arriving. She is A and O x 4 and extremely motivated. " I'm doing so much better today." Pt demonstrated return in both RLE/RUE however more so in RLE than UE. Pt was able to exit bed with assistance, stand several times and ambulate several time. Pt does continue to have slight RLE buckling but less severe than previous date. Reviewed importance of repetition with movements and to focus on R side exercises to promote continued return. Pt is a great CIR candidate. She was completely independent prior to admission and has support spouse to assist once safe to return home. Recommend use of RW when OOB until gait safety improves. She was sitting up in recliner with call bell in reach and BLEs elevated.    Follow Up Recommendations  CIR     Equipment Recommendations  3in1 (PT);Rolling walker with 5" wheels;Other (comment) (pt did better with RW versus just pushing IV pole)    Recommendations for Other Services       Precautions / Restrictions Precautions Precautions: Fall Restrictions Weight Bearing Restrictions: No    Mobility  Bed Mobility Overal bed mobility: Needs Assistance Bed Mobility: Supine to Sit;Sit to Supine;Sidelying to Sit   Sidelying to sit: Min guard Supine to sit: Min guard      General bed mobility comments: Pt was able to roll to side to short sit with increased time and vcs for improve technique/sequencing.  Transfers Overall transfer level: Needs assistance Equipment used: Rolling walker (2 wheeled) (IV pole) Transfers: Sit to/from Stand Sit to Stand: Min assist;Mod assist;From elevated surface (slightly elevated bed height)         General transfer comment: Pt was able to STS 3 x EOb throughout session. first trial STS was to IV pole however pt demonstrated improved abilities with use of RW.  Ambulation/Gait Ambulation/Gait assistance: Min assist;Min guard Gait Distance (Feet): 30 Feet Assistive device: IV Pole;Rolling walker (2 wheeled) Gait Pattern/deviations: Decreased step length - right;Decreased stance time - right;Decreased stride length (poor active dorsiflexion) Gait velocity: WNL   General Gait Details: Pt was able to ambulate 3 x with 1 trial just using IV pole and twpo trials with RW. much improve gait kinematics with use of RW. Does require assistance and Vcs for slowing down to improve technique.       Balance Overall balance assessment: Needs assistance Sitting-balance support: Feet unsupported;Single extremity supported Sitting balance-Leahy Scale: Good Sitting balance - Comments: steady sitting reaching within BOS   Standing balance support: During functional activity Standing balance-Leahy Scale: Poor Standing balance comment: High fall risk. RLE weakness (concerns for buckling)       Cognition Arousal/Alertness: Awake/alert Behavior During Therapy: WFL for tasks assessed/performed Overall Cognitive Status: Within Functional Limits for tasks assessed        General Comments: Pt is A  and O x 4 and extremely motivated.             Pertinent Vitals/Pain Pain Assessment: 0-10 Pain Score: 4  Pain Location: R shoulder pain (has had since December moving boxes) Pain Descriptors / Indicators: Aching;Sore Pain  Intervention(s): Limited activity within patient's tolerance;Monitored during session;Repositioned           PT Goals (current goals can now be found in the care plan section) Acute Rehab PT Goals Patient Stated Goal: Get back to normal life Progress towards PT goals: Progressing toward goals    Frequency    7X/week      PT Plan Current plan remains appropriate       AM-PAC PT "6 Clicks" Mobility   Outcome Measure  Help needed turning from your back to your side while in a flat bed without using bedrails?: A Little Help needed moving from lying on your back to sitting on the side of a flat bed without using bedrails?: A Little Help needed moving to and from a bed to a chair (including a wheelchair)?: A Little Help needed standing up from a chair using your arms (e.g., wheelchair or bedside chair)?: A Lot Help needed to walk in hospital room?: A Lot Help needed climbing 3-5 steps with a railing? : A Lot 6 Click Score: 15    End of Session Equipment Utilized During Treatment: Gait belt Activity Tolerance: Patient tolerated treatment well;Patient limited by fatigue Patient left: in chair;with call bell/phone within reach;with chair alarm set Nurse Communication: Mobility status PT Visit Diagnosis: Other abnormalities of gait and mobility (R26.89);Other symptoms and signs involving the nervous system (R29.898);Hemiplegia and hemiparesis Hemiplegia - Right/Left: Right Hemiplegia - dominant/non-dominant: Dominant Hemiplegia - caused by: Cerebral infarction     Time: 9826-4158 PT Time Calculation (min) (ACUTE ONLY): 30 min  Charges:  $Gait Training: 8-22 mins $Neuromuscular Re-education: 8-22 mins                     Jetta Lout PTA 12/25/20, 10:55 AM

## 2020-12-25 NOTE — TOC Progression Note (Signed)
Transition of Care [facility]) - Progression Note    Patient Details  Name: [name] MRN: [id number] Date of Birth: Jul 14, 1961  [facility]) CM/SW Contact  Hetty Ely, RN Phone Number: 12/25/2020, 4:14 PM  Clinical Narrative: Received message from Attending inquiring about outpatient PT/OT services. Check with the [facility] team, [facility] was recommended, I called [facility] and was transferred around 4x, finally spoke with a representative [name] that informed me that the clinic was now open for telehealth appointment, the process is to call and [phone] and leave your name and contact information for return call and scheduling. I did inform the attending of this information. The Attending states that patient says she will have health Insurance in February. Attending advised to give external referral for patient to schedule and [facility] contact information. No further TOC needs at this time.         Expected Discharge Plan and Services                                                 Social Determinants of Health (SDOH) Interventions    Readmission Risk Interventions No flowsheet data found.

## 2020-12-25 NOTE — Progress Notes (Signed)
CTA head and neck completed. I have reviewed the imaging. Radiologist impression pasted below.  IMPRESSION: 1. Acute infarct left paracentral pons best seen on MRI yesterday. No acute hemorrhage on CT. 2. Negative CT angio head neck. No carotid or vertebral artery stenosis in the neck. 3. Normal basilar artery without atherosclerotic disease or Stenosis.  Updated recommendations: Continue dual antiplatelets for 3 weeks from yesterday and after that aspirin only. Continue statin I would recommend discharging with high blood pressure goal and avoiding hypotension. Mainstay of treatment will be rehabilitation with therapies at this time. Follow-up with outpatient neurology in 4 to 6 weeks-preferably at the stroke clinic-at [place] [facility] in [facility] or [facility] neurology as chosen by the patient.  Inpatient neurology will be available as needed-please call with questions.  Plan relayed to Dr. Richardson Dopp over secure text.  -- Milon Dikes, MD Neurologist [facility] Pager: [phone]  No charge note

## 2020-12-25 NOTE — Plan of Care (Signed)
  Problem: Education: Goal: Knowledge of disease or condition will improve Outcome: Progressing Goal: Knowledge of secondary prevention will improve Outcome: Progressing   Problem: Coping: Goal: Will verbalize positive feelings about self Outcome: Progressing Goal: Will identify appropriate support needs Outcome: Progressing   Problem: Self-Care: Goal: Ability to participate in self-care as condition permits will improve Outcome: Progressing   Problem: Education: Goal: Knowledge of General Education information will improve Description: Including pain rating scale, medication(s)/side effects and non-pharmacologic comfort measures Outcome: Progressing   Problem: Health Behavior/Discharge Planning: Goal: Ability to manage health-related needs will improve Outcome: Progressing   Problem: Clinical Measurements: Goal: Ability to maintain clinical measurements within normal limits will improve Outcome: Progressing Goal: Will remain free from infection Outcome: Progressing Goal: Diagnostic test results will improve Outcome: Progressing Goal: Respiratory complications will improve Outcome: Progressing Goal: Cardiovascular complication will be avoided Outcome: Progressing   Problem: Activity: Goal: Risk for activity intolerance will decrease Outcome: Progressing   Problem: Nutrition: Goal: Adequate nutrition will be maintained Outcome: Progressing   Problem: Coping: Goal: Level of anxiety will decrease Outcome: Progressing   Problem: Elimination: Goal: Will not experience complications related to bowel motility Outcome: Progressing Goal: Will not experience complications related to urinary retention Outcome: Progressing   Problem: Pain Managment: Goal: General experience of comfort will improve Outcome: Progressing   Problem: Safety: Goal: Ability to remain free from injury will improve Outcome: Progressing   Problem: Skin Integrity: Goal: Risk for impaired skin  integrity will decrease Outcome: Progressing

## 2020-12-26 ENCOUNTER — Other Ambulatory Visit: Payer: Self-pay

## 2020-12-26 ENCOUNTER — Encounter (HOSPITAL_COMMUNITY): Payer: Self-pay | Admitting: Physical Medicine & Rehabilitation

## 2020-12-26 ENCOUNTER — Inpatient Hospital Stay (HOSPITAL_COMMUNITY)
Admission: RE | Admit: 2020-12-26 | Discharge: 2021-01-05 | DRG: 057 | Disposition: A | Discharge status: Home / Self Care | Payer: Self-pay | Source: Other Acute Inpatient Hospital | Attending: Physical Medicine & Rehabilitation | Admitting: Physical Medicine & Rehabilitation

## 2020-12-26 DIAGNOSIS — Z79890 Hormone replacement therapy (postmenopausal): Secondary | ICD-10-CM

## 2020-12-26 DIAGNOSIS — J4520 Mild intermittent asthma, uncomplicated: Secondary | ICD-10-CM

## 2020-12-26 DIAGNOSIS — K5901 Slow transit constipation: Secondary | ICD-10-CM | POA: Diagnosis present

## 2020-12-26 DIAGNOSIS — Z8249 Family history of ischemic heart disease and other diseases of the circulatory system: Secondary | ICD-10-CM

## 2020-12-26 DIAGNOSIS — Z803 Family history of malignant neoplasm of breast: Secondary | ICD-10-CM

## 2020-12-26 DIAGNOSIS — S46011D Strain of muscle(s) and tendon(s) of the rotator cuff of right shoulder, subsequent encounter: Secondary | ICD-10-CM

## 2020-12-26 DIAGNOSIS — I10 Essential (primary) hypertension: Secondary | ICD-10-CM

## 2020-12-26 DIAGNOSIS — Z885 Allergy status to narcotic agent status: Secondary | ICD-10-CM

## 2020-12-26 DIAGNOSIS — Z7982 Long term (current) use of aspirin: Secondary | ICD-10-CM

## 2020-12-26 DIAGNOSIS — E559 Vitamin D deficiency, unspecified: Secondary | ICD-10-CM | POA: Diagnosis present

## 2020-12-26 DIAGNOSIS — J45909 Unspecified asthma, uncomplicated: Secondary | ICD-10-CM | POA: Diagnosis present

## 2020-12-26 DIAGNOSIS — Z888 Allergy status to other drugs, medicaments and biological substances status: Secondary | ICD-10-CM

## 2020-12-26 DIAGNOSIS — M75101 Unspecified rotator cuff tear or rupture of right shoulder, not specified as traumatic: Secondary | ICD-10-CM

## 2020-12-26 DIAGNOSIS — Z79899 Other long term (current) drug therapy: Secondary | ICD-10-CM

## 2020-12-26 DIAGNOSIS — G3184 Mild cognitive impairment, so stated: Secondary | ICD-10-CM | POA: Diagnosis present

## 2020-12-26 DIAGNOSIS — I69351 Hemiplegia and hemiparesis following cerebral infarction affecting right dominant side: Principal | ICD-10-CM

## 2020-12-26 DIAGNOSIS — Z833 Family history of diabetes mellitus: Secondary | ICD-10-CM

## 2020-12-26 DIAGNOSIS — I639 Cerebral infarction, unspecified: Secondary | ICD-10-CM | POA: Diagnosis present

## 2020-12-26 DIAGNOSIS — Z7902 Long term (current) use of antithrombotics/antiplatelets: Secondary | ICD-10-CM

## 2020-12-26 DIAGNOSIS — G43909 Migraine, unspecified, not intractable, without status migrainosus: Secondary | ICD-10-CM | POA: Diagnosis present

## 2020-12-26 DIAGNOSIS — S46011A Strain of muscle(s) and tendon(s) of the rotator cuff of right shoulder, initial encounter: Secondary | ICD-10-CM

## 2020-12-26 DIAGNOSIS — F32A Depression, unspecified: Secondary | ICD-10-CM | POA: Diagnosis present

## 2020-12-26 DIAGNOSIS — G8191 Hemiplegia, unspecified affecting right dominant side: Secondary | ICD-10-CM

## 2020-12-26 DIAGNOSIS — X58XXXD Exposure to other specified factors, subsequent encounter: Secondary | ICD-10-CM | POA: Diagnosis present

## 2020-12-26 DIAGNOSIS — Z90711 Acquired absence of uterus with remaining cervical stump: Secondary | ICD-10-CM

## 2020-12-26 MED ORDER — MONTELUKAST SODIUM 10 MG PO TABS
10.0000 mg | ORAL_TABLET | Freq: Every day | ORAL | Status: DC
  Filled 2020-12-26 (×3): qty 1

## 2020-12-26 MED ORDER — MELATONIN 5 MG PO TABS: 5.0000 mg | ORAL_TABLET | Freq: Every evening | ORAL | Status: DC | PRN

## 2020-12-26 MED ORDER — ALUM & MAG HYDROXIDE-SIMETH 200-200-20 MG/5ML PO SUSP: 30.0000 mL | ORAL | Status: DC | PRN

## 2020-12-26 MED ORDER — PROCHLORPERAZINE MALEATE 5 MG PO TABS
5.0000 mg | ORAL_TABLET | Freq: Four times a day (QID) | ORAL | Status: DC | PRN
  Administered 2020-12-27: 10 mg via ORAL
  Filled 2020-12-26: qty 2

## 2020-12-26 MED ORDER — LORATADINE 10 MG PO TABS
10.0000 mg | ORAL_TABLET | Freq: Every day | ORAL | Status: DC
  Administered 2020-12-27 – 2021-01-05 (×10): 10 mg via ORAL
  Filled 2020-12-26 (×10): qty 1

## 2020-12-26 MED ORDER — ASPIRIN EC 81 MG PO TBEC
81.0000 mg | DELAYED_RELEASE_TABLET | Freq: Every day | ORAL | Status: DC
  Administered 2020-12-27 – 2021-01-05 (×10): 81 mg via ORAL
  Filled 2020-12-26 (×10): qty 1

## 2020-12-26 MED ORDER — TRAZODONE HCL 50 MG PO TABS: 25.0000 mg | ORAL_TABLET | Freq: Every evening | ORAL | Status: DC | PRN

## 2020-12-26 MED ORDER — MUSCLE RUB 10-15 % EX CREA
TOPICAL_CREAM | Freq: Two times a day (BID) | CUTANEOUS | Status: DC
  Administered 2020-12-29: 1 via TOPICAL
  Filled 2020-12-26: qty 85

## 2020-12-26 MED ORDER — DIPHENHYDRAMINE HCL 12.5 MG/5ML PO ELIX: 12.5000 mg | ORAL_SOLUTION | Freq: Four times a day (QID) | ORAL | Status: DC | PRN

## 2020-12-26 MED ORDER — TRAMADOL HCL 50 MG PO TABS
50.0000 mg | ORAL_TABLET | ORAL | Status: DC | PRN
  Administered 2020-12-26 – 2021-01-03 (×11): 50 mg via ORAL
  Filled 2020-12-26 (×15): qty 1

## 2020-12-26 MED ORDER — ATORVASTATIN CALCIUM 80 MG PO TABS
80.0000 mg | ORAL_TABLET | Freq: Every day | ORAL | Status: DC
  Administered 2020-12-26 – 2021-01-04 (×10): 80 mg via ORAL
  Filled 2020-12-26 (×10): qty 1

## 2020-12-26 MED ORDER — FLEET ENEMA 7-19 GM/118ML RE ENEM: 1.0000 | ENEMA | Freq: Once | RECTAL | Status: DC | PRN

## 2020-12-26 MED ORDER — VITAMIN B-12 1000 MCG PO TABS
500.0000 ug | ORAL_TABLET | Freq: Every day | ORAL | Status: DC
  Administered 2020-12-27 – 2021-01-05 (×10): 500 ug via ORAL
  Filled 2020-12-26 (×10): qty 1

## 2020-12-26 MED ORDER — POLYETHYLENE GLYCOL 3350 17 G PO PACK
17.0000 g | PACK | Freq: Every day | ORAL | Status: DC | PRN
  Administered 2020-12-29: 17 g via ORAL
  Filled 2020-12-26: qty 1

## 2020-12-26 MED ORDER — PROCHLORPERAZINE 25 MG RE SUPP: 12.5000 mg | Freq: Four times a day (QID) | RECTAL | Status: DC | PRN

## 2020-12-26 MED ORDER — TROLAMINE SALICYLATE 10 % EX CREA
TOPICAL_CREAM | Freq: Two times a day (BID) | CUTANEOUS | Status: DC
  Filled 2020-12-26: qty 85

## 2020-12-26 MED ORDER — GUAIFENESIN-DM 100-10 MG/5ML PO SYRP
5.0000 mL | ORAL_SOLUTION | Freq: Four times a day (QID) | ORAL | Status: DC | PRN
  Filled 2020-12-26: qty 10

## 2020-12-26 MED ORDER — ACETAMINOPHEN 325 MG PO TABS
325.0000 mg | ORAL_TABLET | ORAL | Status: DC | PRN
  Administered 2020-12-28 – 2021-01-05 (×15): 650 mg via ORAL
  Filled 2020-12-26 (×16): qty 2

## 2020-12-26 MED ORDER — BISACODYL 10 MG RE SUPP: 10.0000 mg | Freq: Every day | RECTAL | Status: DC | PRN

## 2020-12-26 MED ORDER — CLOPIDOGREL BISULFATE 75 MG PO TABS
75.0000 mg | ORAL_TABLET | Freq: Every day | ORAL | Status: DC
  Administered 2020-12-27 – 2021-01-05 (×10): 75 mg via ORAL
  Filled 2020-12-26 (×10): qty 1

## 2020-12-26 MED ORDER — ADULT MULTIVITAMIN W/MINERALS CH
1.0000 | ORAL_TABLET | Freq: Every day | ORAL | Status: DC
  Administered 2020-12-27 – 2021-01-05 (×10): 1 via ORAL
  Filled 2020-12-26 (×10): qty 1

## 2020-12-26 MED ORDER — TOPIRAMATE 25 MG PO TABS
25.0000 mg | ORAL_TABLET | Freq: Every day | ORAL | Status: DC
  Administered 2020-12-27 – 2021-01-05 (×10): 25 mg via ORAL
  Filled 2020-12-26 (×10): qty 1

## 2020-12-26 MED ORDER — VITAMIN D3 25 MCG (1000 UNIT) PO TABS
1000.0000 [IU] | ORAL_TABLET | Freq: Every day | ORAL | Status: DC
  Administered 2020-12-27 – 2021-01-05 (×10): 1000 [IU] via ORAL
  Filled 2020-12-26 (×20): qty 1

## 2020-12-26 MED ORDER — CITALOPRAM HYDROBROMIDE 20 MG PO TABS
20.0000 mg | ORAL_TABLET | Freq: Every day | ORAL | Status: DC
  Administered 2020-12-27 – 2021-01-05 (×10): 20 mg via ORAL
  Filled 2020-12-26 (×10): qty 1

## 2020-12-26 MED ORDER — PROCHLORPERAZINE EDISYLATE 10 MG/2ML IJ SOLN: 5.0000 mg | Freq: Four times a day (QID) | INTRAMUSCULAR | Status: DC | PRN

## 2020-12-26 MED ORDER — ENOXAPARIN SODIUM 40 MG/0.4ML ~~LOC~~ SOLN
40.0000 mg | SUBCUTANEOUS | Status: DC
  Administered 2020-12-26 – 2021-01-04 (×10): 40 mg via SUBCUTANEOUS
  Filled 2020-12-26 (×10): qty 0.4

## 2020-12-26 NOTE — H&P (Signed)
Physical Medicine and Rehabilitation Admission H&P    Chief Complaint  Patient presents with  . Stroke with functional deficits.     HPI: [name] is a 60 year female with history of migraines, vitamin D deficiency who was originally seen at [facility] 12/19/20 with HA, right facial droop and right sided weakness. Work up revealed acute left pontine infarct and symptoms resolved prior to d/c home the next day. She was readmitted on 12/23/20 with slurred speech and worsening of right sided weakness. MRI brain done revealing acute/subacute infarct left paramedian pons increased insizie c/w 12/19/20 and small chronic left cerebellar infarct.  CTA head/neck showed acute infarct left paracentral pons without hemorrhage. Patient not tPA candidate and no LVO so no intervention offered. Stroke felt to be due small vessel disease and patient treated with IVF to keep SBP>140.  To continue DAPT X 3 weeks followed by ASA alone and "CIR as main stay of treatment"       Review of Systems  Constitutional: Negative for chills and fever.  HENT: Negative for hearing loss and tinnitus.   Eyes: Negative for blurred vision and double vision.  Respiratory: Negative for cough and shortness of breath.   Cardiovascular: Negative for chest pain and palpitations.  Gastrointestinal: Positive for constipation. Negative for heartburn and nausea.  Musculoskeletal: Positive for back pain, joint pain (injured right shoulder over holidays) and myalgias.  Skin: Negative for itching and rash.  Neurological: Positive for focal weakness. Negative for dizziness and headaches.  Psychiatric/Behavioral: The patient does not have insomnia.      Past Medical History:  Diagnosis Date  . Arthritis    hands, feet, knees  . Asthma   . Migraine   . Mood change    due to menopause/ hot flashes  . PONV (postoperative nausea and vomiting)     Past Surgical History:  Procedure Laterality Date  . ABDOMINAL HYSTERECTOMY     . APPENDECTOMY    . CESAREAN SECTION     x 1  . COLONOSCOPY  2012   due in 2017 for suspicious lesion  . COLONOSCOPY WITH PROPOFOL N/A 08/20/2016   Procedure: COLONOSCOPY WITH PROPOFOL;  Surgeon: Midge Minium, MD;  Location: [facility];  Service: Endoscopy;  Laterality: N/A;  . PARTIAL HYSTERECTOMY     one ovary left  . REPAIR ANKLE LIGAMENT     from fall  . ROTATOR CUFF REPAIR Left    shoulder  . TONSILLECTOMY      Family History  Problem Relation Age of Onset  . Diabetes Paternal Uncle   . Breast cancer Paternal Grandmother 76  . CAD Father     Social History:  Married. 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 She reports that she has never smoked. She has never used smokeless tobacco. She reports current alcohol use--couple ounces of wine occasionally.  She reports that she does not use drugs.    Allergies  Allergen Reactions  . Clavulanic Acid Diarrhea  . Oxycodone Itching  . Tape Rash    Medications Prior to Admission  Medication Sig Dispense Refill  . acetaminophen (TYLENOL) 650 MG CR tablet Take 650 mg by mouth 2 (two) times daily as needed for pain.    Marland Kitchen albuterol (VENTOLIN HFA) 108 (90 Base) MCG/ACT inhaler Inhale 2 puffs into the lungs every 6 (six) hours as needed for wheezing or shortness of breath. 18 g 1  . aspirin EC 81 MG EC tablet Take 1 tablet (81 mg total)  by mouth daily. Swallow whole. 30 tablet 11  . atorvastatin (LIPITOR) 80 MG tablet Take 1 tablet (80 mg total) by mouth daily. 90 tablet 3  . citalopram (CELEXA) 20 MG tablet Take 1 tablet (20 mg total) by mouth daily. 90 tablet 1  . clopidogrel (PLAVIX) 75 MG tablet Take 1 tablet (75 mg total) by mouth daily for 21 days. 21 tablet 0  . cyanocobalamin 500 MCG tablet Take 500 mcg by mouth daily.    . melatonin 5 MG TABS Take 5-10 mg by mouth at bedtime as needed (sleep).    . Multiple Vitamins-Minerals (MULTIVITAMIN ADULTS 50+ PO) Take 1 capsule by mouth daily.    Marland Kitchen tiZANidine (ZANAFLEX) 4 MG  tablet Take 1 tablet (4 mg total) by mouth 3 (three) times daily. 30 tablet 0  . topiramate (TOPAMAX) 25 MG tablet Take 1 tablet (25 mg total) by mouth daily. 90 tablet 3  . Triamcinolone Acetonide (NASACORT ALLERGY 24HR NA) Place 1-2 sprays into both nostrils daily as needed (allergies).    . Vitamin D, Cholecalciferol, 1000 UNITS CAPS Take 1,000 Units by mouth daily.    Marland Kitchen albuterol (PROVENTIL) (2.5 MG/3ML) 0.083% nebulizer solution Take 3 mLs (2.5 mg total) by nebulization every 6 (six) hours as needed for wheezing or shortness of breath. 150 mL 1  . cetirizine (ZYRTEC) 10 MG chewable tablet Chew 10 mg by mouth daily as needed for allergies.    . montelukast (SINGULAIR) 10 MG tablet Take 1 tablet (10 mg total) by mouth at bedtime. (Patient not taking: Reported on 12/23/2020) 90 tablet 3    Drug Regimen Review  Drug regimen was reviewed and remains appropriate with no significant issues identified  Home: Home Living Family/patient expects to be discharged to:: Private residence Living Arrangements: Spouse/significant other Available Help at Discharge: Family,Available 24 hours/day (Husband retired) Type of Home: House Home Access: Stairs to enter Secretary/administrator of Steps: 3 Entrance Stairs-Rails: Left,Right,Can reach both Home Layout: One level Bathroom Shower/Tub: Associate Professor: Yes Home Equipment: None  Lives With: Spouse   Functional History: Prior Function Level of Independence: Independent Comments: 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 first trial STS was to IV pole however pt demonstrated improved abilities with use of RW. Ambulation/Gait Ambulation/Gait assistance: Min assist,Min guard Gait Distance (Feet): 30 Feet Assistive device: IV Pole,Rolling walker (2 wheeled) Gait Pattern/deviations: Decreased step length - right,Decreased stance time - right,Decreased stride length (poor active dorsiflexion) General Gait Details: Pt was able to ambulate 3 x with 1 trial just using IV pole and twpo trials with RW. much improve gait kinematics with use of RW. Does require assistance and Vcs for slowing down to improve technique. Gait velocity: WNL    ADL: ADL Overall ADL's : Needs assistance/impaired Eating/Feeding: Set up,Supervision/ safety,Sitting Grooming: Oral care,Standing,Min guard,Supervision/safety Grooming Details (indicate cue type and reason): weight bearing through R LE as well General ADL Comments: Pt needs assistance to open containers, cut food, and is feeding self with  L UE. Pt needing hand over hand assistance or pt utilizing L UE to move R UE for self care tasks.  Cognition: Cognition Overall Cognitive Status: Within Functional Limits for tasks assessed Orientation Level: Oriented X4 Cognition Arousal/Alertness: Awake/alert Behavior During Therapy: WFL for tasks assessed/performed Overall Cognitive Status: Within Functional Limits for tasks assessed General Comments: Pt is A and O x 4 and extremely motivated.   Blood pressure 135/70, pulse 64, temperature 98 F (36.7 C), temperature source Oral, resp. rate 15, weight 79.3 kg, SpO2 98 %. Physical Exam Vitals and nursing note reviewed.  Constitutional:      Appearance: Normal appearance.     Comments: Pleasant and appropriate.  HENT:     Head: Normocephalic.     Nose: Nose normal.     Mouth/Throat:      Pharynx: No oropharyngeal exudate.  Eyes:     Extraocular Movements: Extraocular movements intact.     Pupils: Pupils are equal, round, and reactive to light.  Cardiovascular:     Rate and Rhythm: Normal rate and regular rhythm.     Heart sounds: No murmur heard. No gallop.   Pulmonary:     Effort: Pulmonary effort is normal. No respiratory distress.     Breath sounds: No wheezing.  Abdominal:     General: Bowel sounds are normal. There is no distension.     Palpations: Abdomen is soft.     Tenderness: There is no abdominal tenderness.  Musculoskeletal:     Comments: Mild pain with impingement maneuver right shoulder, mild pain with palpation along subacromial region. No sublux.   Skin:    General: Skin is warm and dry.  Neurological:     Mental Status: She is alert.     Comments: Minimal right central 7. Remainder of CN exam normal. Good insight and awareness. Functional memory. normal language. No dysarthria. RUE: Trace/5 deltoid, 1/5 pec and bicep, 0/5 tricep, wrist and HI. RLE 3/5 HF, KE and 1+/5 APF, 0/5 ADF. Right heel cord tight. DTR's increased on right. LUE and LLE 5/5.   Psychiatric:        Mood and Affect: Mood normal.        Behavior: Behavior normal.        Thought Content: Thought content normal.        Judgment: Judgment normal.     No results found for this or any previous visit (from the past 48 hour(s)). CT ANGIO HEAD W OR WO CONTRAST  Result Date: 12/24/2020 CLINICAL DATA:  Stroke.  Right-sided weakness. EXAM: CT ANGIOGRAPHY HEAD AND NECK TECHNIQUE: Multidetector CT imaging of the head and neck was performed using the standard protocol during bolus administration of intravenous contrast. Multiplanar CT image reconstructions and MIPs were obtained to evaluate the vascular anatomy. Carotid stenosis measurements (when applicable) are obtained utilizing NASCET criteria, using the distal internal carotid diameter as the denominator. CONTRAST:  21mL OMNIPAQUE IOHEXOL  350 MG/ML SOLN COMPARISON:  MRI head 12/23/2020 FINDINGS: CT HEAD FINDINGS Brain: Acute infarct left paracentral pons best seen on MRI. This areas obscured by streak artifact on CT. Ventricle size normal. No acute hemorrhage. No other area of infarction identified. Vascular: Negative for hyperdense vessel Skull: Negative Sinuses: Paranasal sinuses clear. Orbits: Negative Review of the MIP images confirms the above findings CTA NECK FINDINGS Aortic arch: Standard branching. Imaged portion shows no evidence of aneurysm or dissection. No significant stenosis of the major arch vessel origins. Right carotid system: Right carotid widely patent without stenosis  or significant atherosclerotic disease. Left carotid system: Left carotid widely patent without stenosis or significant atherosclerotic disease Vertebral arteries: Left vertebral artery dominant and widely patent to the basilar. Small right vertebral artery ends in PICA with minimal contribution to the basilar. Skeleton: Cervical spondylosis.  No acute skeletal abnormality. Other neck: Negative for mass or adenopathy. Upper chest: Lung apices clear bilaterally. Review of the MIP images confirms the above findings CTA HEAD FINDINGS Anterior circulation: Internal carotid artery widely patent through the skull base and cavernous segments. Anterior and middle cerebral arteries widely patent. Negative for large vessel occlusion or flow limiting stenosis. Posterior circulation: PICA patent bilaterally. Basilar supplied primarily by the left vertebral artery. Left AICA patent. Basilar widely patent. Superior cerebellar and posterior cerebral arteries patent bilaterally without stenosis. No significant atherosclerotic disease in the basilar artery. Venous sinuses: Normal venous enhancement Anatomic variants: None Review of the MIP images confirms the above findings IMPRESSION: 1. Acute infarct left paracentral pons best seen on MRI yesterday. No acute hemorrhage on CT. 2.  Negative CT angio head neck. No carotid or vertebral artery stenosis in the neck. 3. Normal basilar artery without atherosclerotic disease or stenosis. Electronically Signed   By: Marlan Palau M.D.   On: 12/24/2020 15:52   CT ANGIO NECK W OR WO CONTRAST  Result Date: 12/24/2020 CLINICAL DATA:  Stroke.  Right-sided weakness. EXAM: CT ANGIOGRAPHY HEAD AND NECK TECHNIQUE: Multidetector CT imaging of the head and neck was performed using the standard protocol during bolus administration of intravenous contrast. Multiplanar CT image reconstructions and MIPs were obtained to evaluate the vascular anatomy. Carotid stenosis measurements (when applicable) are obtained utilizing NASCET criteria, using the distal internal carotid diameter as the denominator. CONTRAST:  80mL OMNIPAQUE IOHEXOL 350 MG/ML SOLN COMPARISON:  MRI head 12/23/2020 FINDINGS: CT HEAD FINDINGS Brain: Acute infarct left paracentral pons best seen on MRI. This areas obscured by streak artifact on CT. Ventricle size normal. No acute hemorrhage. No other area of infarction identified. Vascular: Negative for hyperdense vessel Skull: Negative Sinuses: Paranasal sinuses clear. Orbits: Negative Review of the MIP images confirms the above findings CTA NECK FINDINGS Aortic arch: Standard branching. Imaged portion shows no evidence of aneurysm or dissection. No significant stenosis of the major arch vessel origins. Right carotid system: Right carotid widely patent without stenosis or significant atherosclerotic disease. Left carotid system: Left carotid widely patent without stenosis or significant atherosclerotic disease Vertebral arteries: Left vertebral artery dominant and widely patent to the basilar. Small right vertebral artery ends in PICA with minimal contribution to the basilar. Skeleton: Cervical spondylosis.  No acute skeletal abnormality. Other neck: Negative for mass or adenopathy. Upper chest: Lung apices clear bilaterally. Review of the MIP  images confirms the above findings CTA HEAD FINDINGS Anterior circulation: Internal carotid artery widely patent through the skull base and cavernous segments. Anterior and middle cerebral arteries widely patent. Negative for large vessel occlusion or flow limiting stenosis. Posterior circulation: PICA patent bilaterally. Basilar supplied primarily by the left vertebral artery. Left AICA patent. Basilar widely patent. Superior cerebellar and posterior cerebral arteries patent bilaterally without stenosis. No significant atherosclerotic disease in the basilar artery. Venous sinuses: Normal venous enhancement Anatomic variants: None Review of the MIP images confirms the above findings IMPRESSION: 1. Acute infarct left paracentral pons best seen on MRI yesterday. No acute hemorrhage on CT. 2. Negative CT angio head neck. No carotid or vertebral artery stenosis in the neck. 3. Normal basilar artery without atherosclerotic disease or stenosis. Electronically Signed  By: Marlan Palauharles  Clark M.D.   On: 12/24/2020 15:52       Medical Problem List and Plan: 1.  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  Antithrombotics: -DVT/anticoagulation:  Pharmaceutical: Lovenox  -antiplatelet therapy: DAPT 3. Pain Management/ pre-morbid right RTC injury: tylenol for mild pain, tramadol for more severe pain  -add muscle rub   -support right upper ext in bed/chair  -ROM exercises with PT/OT 4. Mood:  LCSW to follow for evaluation and support.   -antipsychotic agents: N/a 5. Neuropsych: This patient is capable of making decisions on her own behalf. 6. Skin/Wound Care: Routine pressure relief measures.  7. Fluids/Electrolytes/Nutrition: Monitor I/O. Check lytes in am.  8. Blood pressures: Higher BP goal recommended and to avoid hypotension.  9. H/o Migraines: Continue Topamax. 10.  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 11. H/o depression: Stable on current dose Celexa.       Jacquelynn Creeamela S Love, PA-C 12/26/2020

## 2020-12-26 NOTE — PMR Pre-admission (Signed)
PMR Admission Coordinator Pre-Admission Assessment  Patient: [name] is an 60 y.o., female MRN: [id number] DOB: 1960-12-31 Height:   Weight: 79.3 kg  Insurance Information  PRIMARY: uninsured; states insurance begins 12/30/2020. I have asked her to provide me with her insurance so that on 12/30/2020 we can file insurance for possible coverage beginning 12/30/2020. Patient given Good Faith Estimate on 12/26/2020       Financial Counselor:       Phone#:   The "Data Collection Information Summary" for patients in Inpatient Rehabilitation Facilities with attached "Privacy Act Statement-Health Care Records" was provided and verbally reviewed with: N/A  Emergency Contact Information Contact Information    Name Relation Home Work Mobile   [place] Spouse   [phone]   Roda Shutters   [phone]      Current Medical History  Patient Admitting Diagnosis: CVA  History of Present Illness: 60 year old female with medical history significant for recent pontine stroke, history of migraine headaches who presented on 12/23/2020 via private vehicle for worsening right sided weakness involving her right upper and lower extremity as well as slurred speech. Patient was recently discharged on 12/20/2020 following  hospitalization for an acute left pontine infarct. She had right sided weakness at that time that had resolved prior to her discharge.   MRI of brain shows 1.2 x 0.7 cm acute/early subacute infarct within the paramedian left pons, increased in size from the brain MRI of 12/19/20 ( previously 1.1 x 0.2 c,). Neurology consulted. Resume Aspirin, Plavix and statin for 3 weeks and then aspirin alone. Avoid hypotension. Recent 2D echo showed EF 60 to 65 % with no evidence of thrombus. Topamax for migraines. Lower extremity weakness has improved since admission. Plan follow up with Neurology as an outpatient.   Complete NIHSS TOTAL: 5  Patient's medical record from [facility] has been  reviewed by the rehabilitation admission coordinator and physician.  Past Medical History  Past Medical History:  Diagnosis Date  . Arthritis    hands, feet, knees  . Asthma   . Migraine   . Mood change    due to menopause/ hot flashes  . PONV (postoperative nausea and vomiting)     Family History   family history includes Breast cancer (age of onset: 41) in her paternal grandmother; CAD in her father; Diabetes in her paternal uncle.  Prior Rehab/Hospitalizations Has the patient had prior rehab or hospitalizations prior to admission? Yes  Has the patient had major surgery during 100 days prior to admission? Yes   Current Medications  Current Facility-Administered Medications:  .   stroke: mapping our early stages of recovery book, , Does not apply, Once, Agbata, Tochukwu, MD .  0.9 %  sodium chloride infusion, , Intravenous, Continuous, Kathlen Mody, MD, Last Rate: 75 mL/hr at 12/26/20 0525, New Bag at 12/26/20 0525 .  acetaminophen (TYLENOL) tablet 650 mg, 650 mg, Oral, Q6H PRN, Agbata, Tochukwu, MD, 650 mg at 12/24/20 2037 .  albuterol (VENTOLIN HFA) 108 (90 Base) MCG/ACT inhaler 2 puff, 2 puff, Inhalation, Q6H PRN, Agbata, Tochukwu, MD .  aspirin EC tablet 81 mg, 81 mg, Oral, Daily, Agbata, Tochukwu, MD, 81 mg at 12/26/20 0842 .  atorvastatin (LIPITOR) tablet 80 mg, 80 mg, Oral, QHS, Agbata, Tochukwu, MD, 80 mg at 12/25/20 2110 .  cholecalciferol (VITAMIN D) tablet 1,000 Units, 1,000 Units, Oral, Daily, Agbata, Tochukwu, MD, 1,000 Units at 12/26/20 0841 .  citalopram (CELEXA) tablet 20 mg, 20 mg, Oral, Daily, Agbata, Tochukwu, MD, 20 mg  at 12/26/20 0841 .  clopidogrel (PLAVIX) tablet 75 mg, 75 mg, Oral, Daily, Ronnald Ramp, RPH, 75 mg at 12/26/20 0843 .  fluticasone (FLONASE) 50 MCG/ACT nasal spray 1 spray, 1 spray, Each Nare, Daily PRN, Agbata, Tochukwu, MD .  loratadine (CLARITIN) tablet 10 mg, 10 mg, Oral, Daily, Agbata, Tochukwu, MD, 10 mg at 12/26/20 0841 .  melatonin  tablet 5-10 mg, 5-10 mg, Oral, QHS PRN, Agbata, Tochukwu, MD, 10 mg at 12/24/20 2037 .  montelukast (SINGULAIR) tablet 10 mg, 10 mg, Oral, QHS, Agbata, Tochukwu, MD .  multivitamin with minerals tablet 1 tablet, 1 tablet, Oral, Daily, Agbata, Tochukwu, MD, 1 tablet at 12/26/20 0842 .  ondansetron (ZOFRAN) injection 4 mg, 4 mg, Intravenous, Q6H PRN, Kathlen Mody, MD, 4 mg at 12/26/20 0843 .  topiramate (TOPAMAX) tablet 25 mg, 25 mg, Oral, Daily, Agbata, Tochukwu, MD, 25 mg at 12/26/20 0848 .  traMADol (ULTRAM) tablet 50 mg, 50 mg, Oral, Q4H PRN, Mansy, Jan A, MD, 50 mg at 12/26/20 [id number] .  vitamin B-12 (CYANOCOBALAMIN) tablet 500 mcg, 500 mcg, Oral, Daily, Agbata, Tochukwu, MD, 500 mcg at 12/26/20 0841  Patients Current Diet:  Diet Order            Diet - low sodium heart healthy           Diet 2 gram sodium Room service appropriate? Yes; Fluid consistency: Thin  Diet effective now                 Precautions / Restrictions Precautions Precautions: Fall Precaution Comments: R hemi Restrictions Weight Bearing Restrictions: No   Has the patient had 2 or more falls or a fall with injury in the past year? No  Prior Activity Level Community (5-7x/wk): works remote for [employer]; Independent and driving  Prior Functional Level Self Care: Did the patient need help bathing, dressing, using the toilet or eating? Independent  Indoor Mobility: Did the patient need assistance with walking from room to room (with or without device)? Independent  Stairs: Did the patient need assistance with internal or external stairs (with or without device)? Independent  Functional Cognition: Did the patient need help planning regular tasks such as shopping or remembering to take medications? Independent  Home Assistive Devices / Equipment Home Assistive Devices/Equipment: None Home Equipment: None  Prior Device Use: Indicate devices/aids used by the patient prior to current illness, exacerbation or injury?  None of the above  Current Functional Level Cognition  Overall Cognitive Status: Within Functional Limits for tasks assessed Orientation Level: Oriented X4 General Comments: Pt is A and O x 4 and extremely motivated.    Extremity Assessment (includes Sensation/Coordination)  Upper Extremity Assessment: Defer to OT evaluation,RUE deficits/detail RUE Deficits / Details: per OT eval "2-/5 in gravity eliminated position"; 0/5 R grip strength/finger flexion and finger extension RUE Sensation: WNL RUE Coordination: decreased fine motor,decreased gross motor  Lower Extremity Assessment: RLE deficits/detail (L LE WFL; intact B LE proprioception and light touch) RLE Deficits / Details: able to perform SLR with decreased quality; hip flexion 2+/5; knee extension 2+/5; knee flexion 3-/5; DF 0/5 (able to slightly wiggle toes); PF 2+/5; increased tone noted R LE; R DF to neutral PROM (limited d/t increased R LE tone); decreased R LE heel to shin coordination RLE Coordination: decreased gross motor,decreased fine motor    ADLs  Overall ADL's : Needs assistance/impaired Eating/Feeding: Set up,Supervision/ safety,Sitting Grooming: Oral care,Standing,Min guard,Supervision/safety Grooming Details (indicate cue type and reason): weight bearing through R LE  as well General ADL Comments: Pt needs assistance to open containers, cut food, and is feeding self with L UE. Pt needing hand over hand assistance or pt utilizing L UE to move R UE for self care tasks.    Mobility  Overal bed mobility: Needs Assistance Bed Mobility: Supine to Sit,Sit to Supine Sidelying to sit: Min guard Supine to sit: Min guard Sit to supine: Min guard General bed mobility comments: min cuing for technique and hand placement    Transfers  Overall transfer level: Needs assistance Equipment used: None Transfers: Sit to/from Stand Sit to Stand: Min assist,Mod assist Stand pivot transfers: Mod assist General transfer comment: Pt  was able to STS 3 x EOb throughout session. first trial STS was to IV pole however pt demonstrated improved abilities with use of RW.    Ambulation / Gait / Stairs / Wheelchair Mobility  Ambulation/Gait Ambulation/Gait assistance: Min assist,Min guard Educational psychologist (Feet): 30 Feet Assistive device: IV Pole,Rolling walker (2 wheeled) Gait Pattern/deviations: Decreased step length - right,Decreased stance time - right,Decreased stride length (poor active dorsiflexion) General Gait Details: Pt was able to ambulate 3 x with 1 trial just using IV pole and twpo trials with RW. much improve gait kinematics with use of RW. Does require assistance and Vcs for slowing down to improve technique. Gait velocity: WNL    Posture / Balance Dynamic Sitting Balance Sitting balance - Comments: steady sitting reaching within BOS Balance Overall balance assessment: Needs assistance Sitting-balance support: Feet unsupported,Single extremity supported Sitting balance-Leahy Scale: Good Sitting balance - Comments: steady sitting reaching within BOS Standing balance support: During functional activity Standing balance-Leahy Scale: Poor Standing balance comment: High fall risk. RLE weakness (concerns for buckling)    Special needs/care consideration Designated visitor is spouse, Casimiro Needle   Previous Home Environment  Living Arrangements: Spouse/significant other  Lives With: Spouse Available Help at Discharge: Family,Available 24 hours/day (Husband retired) Type of Home: House Home Layout: One level Home Access: Stairs to enter Entrance Stairs-Rails: Left,Right,Can reach both Secretary/administrator of Steps: 3 Bathroom Shower/Tub: Engineer, manufacturing systems: Standard Bathroom Accessibility: Yes How Accessible: Accessible via walker Home Care Services: No  Discharge Living Setting Plans for Discharge Living Setting: Patient's home,Lives with (comment) (spouse) Type of Home at Discharge:  House Discharge Home Layout: One level Discharge Home Access: Stairs to enter Entrance Stairs-Rails: Right,Left,Can reach both Entrance Stairs-Number of Steps: 3 Discharge Bathroom Shower/Tub: Tub/shower unit Discharge Bathroom Toilet: Standard Discharge Bathroom Accessibility: Yes How Accessible: Accessible via walker Does the patient have any problems obtaining your medications?: No  Social/Family/Support Systems Contact Information: spouse, Casimiro Needle Anticipated Caregiver: spouse Anticipated Industrial/product designer Information: see above Ability/Limitations of Caregiver: spouse retired Engineer, structural Availability: 24/7 Discharge Plan Discussed with Primary Caregiver: Yes Is Caregiver In Agreement with Plan?: Yes Does Caregiver/Family have Issues with Lodging/Transportation while Pt is in Rehab?: No  Goals Patient/Family Goal for Rehab: Mod I to supervision with PT and OT Expected length of stay: ELOS 7 to 10 days Pt/Family Agrees to Admission and willing to participate: Yes Program Orientation Provided & Reviewed with Pt/Caregiver Including Roles  & Responsibilities: Yes  Decrease burden of Care through IP rehab admission: n/a  Possible need for SNF placement upon discharge: not anticipated  Patient Condition: I have reviewed medical records from [facility], spoken with  patient. I discussed via phone for inpatient rehabilitation assessment.  Patient will benefit from ongoing PT and OT, can actively participate in 3 hours of therapy a day 5 days of the  week, and can make measurable gains during the admission.  Patient will also benefit from the coordinated team approach during an Inpatient Acute Rehabilitation admission.  The patient will receive intensive therapy as well as Rehabilitation physician, nursing, social worker, and care management interventions.  Due to bladder management, bowel management, safety, skin/wound care, disease management, medication administration, pain management and  patient education the patient requires 24 hour a day rehabilitation nursing.  The patient is currently min assist overall with mobility and basic ADLs.  Discharge setting and therapy post discharge at home with home health is anticipated.  Patient has agreed to participate in the Acute Inpatient Rehabilitation Program and will admit today.  Preadmission Screen Completed By:  Clois Dupes, 12/26/2020 11:22 AM ______________________________________________________________________   Discussed status with Dr. Allena Katz  on  12/26/2020 at  1110 and received approval for admission today.  Admission Coordinator:  Clois Dupes, RN, time  0258 Date  12/26/2020   Assessment/Plan: Diagnosis: left pontine infarct 1. Does the need for close, 24 hr/day Medical supervision in concert with the patient's rehab needs make it unreasonable for this patient to be served in a less intensive setting? Yes 2. Co-Morbidities requiring supervision/potential complications: Migraines, post-stroke sequelae 3. Due to bladder management, bowel management, safety, skin/wound care, disease management, medication administration, pain management and patient education, does the patient require 24 hr/day rehab nursing? Yes 4. Does the patient require coordinated care of a physician, rehab nurse, PT, OT, and SLP to address physical and functional deficits in the context of the above medical diagnosis(es)? Yes Addressing deficits in the following areas: balance, endurance, locomotion, strength, transferring, bowel/bladder control, bathing, dressing, feeding, grooming, toileting and psychosocial support 5. Can the patient actively participate in an intensive therapy program of at least 3 hrs of therapy 5 days a week? Yes 6. The potential for patient to make measurable gains while on inpatient rehab is excellent 7. Anticipated functional outcomes upon discharge from inpatient rehab: modified independent and supervision PT,  modified independent and supervision OT, n/a SLP 8. Estimated rehab length of stay to reach the above functional goals is: 7-10 days 9. Anticipated discharge destination: Home 10. Overall Rehab/Functional Prognosis: excellent   MD Signature: Ranelle Oyster, MD, [facility] Rehabilitation 12/26/2020

## 2020-12-26 NOTE — Progress Notes (Addendum)
Inpatient Rehabilitation Admissions Coordinator  I spoke with patient by phone. She is interested in pursuing admit to [facility] in [place]. I will verify this morning if a bed would be available this week for her admission.  Ottie Glazier, RN, MSN Rehab Admissions Coordinator [phone] 12/26/2020 8:43 AM   CIR bed at [facility] in [place] is available to admit patient to today. I discussed with patient and she is in agreement. Dr. Blake Divine is preparing patient for discharge and I have notified acute team and TOC. I will call Care link to make the arrangements to admit today to [address] with Dr. Maryla Morrow will be admitting MD.  Ottie Glazier, RN, MSN Rehab Admissions Coordinator [phone] 12/26/2020 10:52 AM

## 2020-12-26 NOTE — Discharge Summary (Signed)
Physician Discharge Summary  [name] UJW:[id number] DOB: 1960/12/29 DOA: 12/23/2020  PCP: Reubin Milan, MD  Admit date: 12/23/2020 Discharge date: 12/26/2020  Admitted From: Home.  Disposition:  Home.   Recommendations for Outpatient Follow-up:  1. Follow up with PCP in 1-2 weeks 2. Please obtain BMP/CBC in one week 3. Please follow up with neurology in 4 weeks as recommended.     Discharge Condition:stable.  CODE STATUS:FULL CODE.  Diet recommendation: Heart Healthy   Brief/Interim Summary: 60 year old lady with prior history of recent pontine stroke, migraine headaches presents to ED for worsening right-sided weakness involving the right upper and lower extremity and slurred speech.  MRI of the brain shows 1.2 x 0.7 cm acute/early subacute infarct within the paramedian left pons, increased in size from the brain MRI of 12/19/2020 (previously 1.1 x 0.2 cm),.  Neurology consulted and is on board.  recommendations to continue with DAPT and statin.    Discharge Diagnoses:  Principal Problem:   Acute CVA (cerebrovascular accident) (HCC) Active Problems:   Obesity, Class I, BMI 30-34.9   History of migraine headaches  Acute infarct within the left pons increased in size Presents to ED with increased right-sided weakness and dysarthria which have improved.  Resume aspirin and plavix and statin. Appreciate neurology recommendations. Avoid hypotension Therapy evaluations recommending CIR. consulted CIR.   Recent 2D echocardiogram showed left ventricular ejection fraction of 60 to 65% with no evidence of cardiac thrombus. Patient reports her lower extremity strength has improved and is close to baseline. Right upper extremity strength has improved    Migraine headaches; On topamax.    Depression Continue with home medications  Body mass index is 30.01 kg/m.   Discharge Instructions  Discharge Instructions    Diet - low sodium heart healthy   Complete by:  As directed    Discharge instructions   Complete by: As directed    Please follow up with neurology as recommended.   Increase activity slowly   Complete by: As directed      Allergies as of 12/26/2020      Reactions   Clavulanic Acid Diarrhea   Oxycodone Itching   Tape Rash      Medication List    STOP taking these medications   montelukast 10 MG tablet Commonly known as: SINGULAIR   tiZANidine 4 MG tablet Commonly known as: ZANAFLEX     TAKE these medications   acetaminophen 650 MG CR tablet Commonly known as: TYLENOL Take 650 mg by mouth 2 (two) times daily as needed for pain.   albuterol (2.5 MG/3ML) 0.083% nebulizer solution Commonly known as: PROVENTIL Take 3 mLs (2.5 mg total) by nebulization every 6 (six) hours as needed for wheezing or shortness of breath.   albuterol 108 (90 Base) MCG/ACT inhaler Commonly known as: VENTOLIN HFA Inhale 2 puffs into the lungs every 6 (six) hours as needed for wheezing or shortness of breath.   aspirin 81 MG EC tablet Take 1 tablet (81 mg total) by mouth daily. Swallow whole.   atorvastatin 80 MG tablet Commonly known as: LIPITOR Take 1 tablet (80 mg total) by mouth daily.   cetirizine 10 MG chewable tablet Commonly known as: ZYRTEC Chew 10 mg by mouth daily as needed for allergies.   citalopram 20 MG tablet Commonly known as: CELEXA Take 1 tablet (20 mg total) by mouth daily.   clopidogrel 75 MG tablet Commonly known as: PLAVIX Take 1 tablet (75 mg total) by mouth daily for 21 days.  melatonin 5 MG Tabs Take 5-10 mg by mouth at bedtime as needed (sleep).   MULTIVITAMIN ADULTS 50+ PO Take 1 capsule by mouth daily.   NASACORT ALLERGY 24HR NA Place 1-2 sprays into both nostrils daily as needed (allergies).   topiramate 25 MG tablet Commonly known as: TOPAMAX Take 1 tablet (25 mg total) by mouth daily.   vitamin B-12 500 MCG tablet Commonly known as: CYANOCOBALAMIN Take 500 mcg by mouth daily.   Vitamin D  (Cholecalciferol) 25 MCG (1000 UT) Caps Take 1,000 Units by mouth daily.       Follow-up Information    Reubin MilanBerglund, Laura H, MD. Schedule an appointment as soon as possible for a visit in 1 week(s).   Specialty: Internal Medicine Contact information: [address] [place] [id number] [phone]              Allergies  Allergen Reactions  . Clavulanic Acid Diarrhea  . Oxycodone Itching  . Tape Rash    Consultations:  Neurology.    Procedures/Studies: CT ANGIO HEAD W OR WO CONTRAST  Result Date: 12/24/2020 CLINICAL DATA:  Stroke.  Right-sided weakness. EXAM: CT ANGIOGRAPHY HEAD AND NECK TECHNIQUE: Multidetector CT imaging of the head and neck was performed using the standard protocol during bolus administration of intravenous contrast. Multiplanar CT image reconstructions and MIPs were obtained to evaluate the vascular anatomy. Carotid stenosis measurements (when applicable) are obtained utilizing NASCET criteria, using the distal internal carotid diameter as the denominator. CONTRAST:  75mL OMNIPAQUE IOHEXOL 350 MG/ML SOLN COMPARISON:  MRI head 12/23/2020 FINDINGS: CT HEAD FINDINGS Brain: Acute infarct left paracentral pons best seen on MRI. This areas obscured by streak artifact on CT. Ventricle size normal. No acute hemorrhage. No other area of infarction identified. Vascular: Negative for hyperdense vessel Skull: Negative Sinuses: Paranasal sinuses clear. Orbits: Negative Review of the MIP images confirms the above findings CTA NECK FINDINGS Aortic arch: Standard branching. Imaged portion shows no evidence of aneurysm or dissection. No significant stenosis of the major arch vessel origins. Right carotid system: Right carotid widely patent without stenosis or significant atherosclerotic disease. Left carotid system: Left carotid widely patent without stenosis or significant atherosclerotic disease Vertebral arteries: Left vertebral artery dominant and widely patent to  the basilar. Small right vertebral artery ends in PICA with minimal contribution to the basilar. Skeleton: Cervical spondylosis.  No acute skeletal abnormality. Other neck: Negative for mass or adenopathy. Upper chest: Lung apices clear bilaterally. Review of the MIP images confirms the above findings CTA HEAD FINDINGS Anterior circulation: Internal carotid artery widely patent through the skull base and cavernous segments. Anterior and middle cerebral arteries widely patent. Negative for large vessel occlusion or flow limiting stenosis. Posterior circulation: PICA patent bilaterally. Basilar supplied primarily by the left vertebral artery. Left AICA patent. Basilar widely patent. Superior cerebellar and posterior cerebral arteries patent bilaterally without stenosis. No significant atherosclerotic disease in the basilar artery. Venous sinuses: Normal venous enhancement Anatomic variants: None Review of the MIP images confirms the above findings IMPRESSION: 1. Acute infarct left paracentral pons best seen on MRI yesterday. No acute hemorrhage on CT. 2. Negative CT angio head neck. No carotid or vertebral artery stenosis in the neck. 3. Normal basilar artery without atherosclerotic disease or stenosis. Electronically Signed   By: Marlan Palauharles  Clark M.D.   On: 12/24/2020 15:52   DG Chest 2 View  Result Date: 12/23/2020 CLINICAL DATA:  Right-sided weakness following previous TI EXAM: CHEST - 2 VIEW COMPARISON:  None.  FINDINGS: The heart size and mediastinal contours are within normal limits. Both lungs are clear. The visualized skeletal structures are unremarkable. IMPRESSION: No active cardiopulmonary disease. Electronically Signed   By: Alcide Clever M.D.   On: 12/23/2020 16:39   CT HEAD WO CONTRAST  Result Date: 12/23/2020 CLINICAL DATA:  Right-sided weakness and slurred speech EXAM: CT HEAD WITHOUT CONTRAST TECHNIQUE: Contiguous axial images were obtained from the base of the skull through the vertex without  intravenous contrast. COMPARISON:  None. FINDINGS: Brain: There is no acute intracranial hemorrhage, mass effect, or edema. Gray-white differentiation is preserved. There is no extra-axial fluid collection. Ventricles and sulci are within normal limits in size and configuration. Small chronic infarct of the left cerebellum. Vascular: No hyperdense vessel. Skull: Calvarium is unremarkable. Sinuses/Orbits: No acute finding. Other: None. IMPRESSION: No acute intracranial hemorrhage or evidence of acute infarction. Small chronic left cerebellar infarct. Electronically Signed   By: Guadlupe Spanish M.D.   On: 12/23/2020 09:56   CT HEAD WO CONTRAST  Result Date: 12/19/2020 CLINICAL DATA:  Right-sided weakness.  Transient ischemic attack. EXAM: CT HEAD WITHOUT CONTRAST TECHNIQUE: Contiguous axial images were obtained from the base of the skull through the vertex without intravenous contrast. COMPARISON:  None. FINDINGS: Brain: No evidence of acute infarction, hemorrhage, hydrocephalus, extra-axial collection or mass lesion/mass effect. Vascular: No hyperdense vessel or unexpected calcification. Skull: Normal. Negative for fracture or focal lesion. Sinuses/Orbits: No acute finding. Other: None. IMPRESSION: Normal head CT. Electronically Signed   By: Lupita Raider M.D.   On: 12/19/2020 10:01   CT ANGIO NECK W OR WO CONTRAST  Result Date: 12/24/2020 CLINICAL DATA:  Stroke.  Right-sided weakness. EXAM: CT ANGIOGRAPHY HEAD AND NECK TECHNIQUE: Multidetector CT imaging of the head and neck was performed using the standard protocol during bolus administration of intravenous contrast. Multiplanar CT image reconstructions and MIPs were obtained to evaluate the vascular anatomy. Carotid stenosis measurements (when applicable) are obtained utilizing NASCET criteria, using the distal internal carotid diameter as the denominator. CONTRAST:  75mL OMNIPAQUE IOHEXOL 350 MG/ML SOLN COMPARISON:  MRI head 12/23/2020 FINDINGS: CT HEAD  FINDINGS Brain: Acute infarct left paracentral pons best seen on MRI. This areas obscured by streak artifact on CT. Ventricle size normal. No acute hemorrhage. No other area of infarction identified. Vascular: Negative for hyperdense vessel Skull: Negative Sinuses: Paranasal sinuses clear. Orbits: Negative Review of the MIP images confirms the above findings CTA NECK FINDINGS Aortic arch: Standard branching. Imaged portion shows no evidence of aneurysm or dissection. No significant stenosis of the major arch vessel origins. Right carotid system: Right carotid widely patent without stenosis or significant atherosclerotic disease. Left carotid system: Left carotid widely patent without stenosis or significant atherosclerotic disease Vertebral arteries: Left vertebral artery dominant and widely patent to the basilar. Small right vertebral artery ends in PICA with minimal contribution to the basilar. Skeleton: Cervical spondylosis.  No acute skeletal abnormality. Other neck: Negative for mass or adenopathy. Upper chest: Lung apices clear bilaterally. Review of the MIP images confirms the above findings CTA HEAD FINDINGS Anterior circulation: Internal carotid artery widely patent through the skull base and cavernous segments. Anterior and middle cerebral arteries widely patent. Negative for large vessel occlusion or flow limiting stenosis. Posterior circulation: PICA patent bilaterally. Basilar supplied primarily by the left vertebral artery. Left AICA patent. Basilar widely patent. Superior cerebellar and posterior cerebral arteries patent bilaterally without stenosis. No significant atherosclerotic disease in the basilar artery. Venous sinuses: Normal venous enhancement Anatomic variants:  None Review of the MIP images confirms the above findings IMPRESSION: 1. Acute infarct left paracentral pons best seen on MRI yesterday. No acute hemorrhage on CT. 2. Negative CT angio head neck. No carotid or vertebral artery  stenosis in the neck. 3. Normal basilar artery without atherosclerotic disease or stenosis. Electronically Signed   By: Marlan Palau M.D.   On: 12/24/2020 15:52   MR ANGIO HEAD WO CONTRAST  Result Date: 12/19/2020 CLINICAL DATA:  Neuro deficit, acute, stroke suspected. EXAM: MRA HEAD WITHOUT CONTRAST TECHNIQUE: Angiographic images of the Circle of Willis were obtained using MRA technique without intravenous contrast. COMPARISON:  Brain MRI 12/19/2020.  Head CT 12/19/2020. FINDINGS: The intracranial internal carotid arteries are patent. The M1 middle cerebral arteries are patent. No M2 proximal branch occlusion or high-grade proximal stenosis is identified. The anterior cerebral arteries are patent. 1-2 mm inferiorly projecting vascular protrusion arising from the paraclinoid right ICA which may reflect an infundibulum or small aneurysm (series 1056, image 15). Non dominant intracranial right vertebral artery which terminates predominantly as the right PICA. The dominant intracranial left vertebral artery is patent without stenosis, as is the basilar artery. The posterior cerebral arteries are patent bilaterally. A left posterior communicating artery is present. The right posterior communicating artery is hypoplastic or absent. There is an apparent 1-2 mm vascular protrusion projecting rightward from the proximal basilar artery on the MIP images. However, a diminutive vertebral artery can be seen extending to this point on the source images and this does not reflect an aneurysm. IMPRESSION: No intracranial large vessel occlusion or proximal high-grade arterial stenosis. 1-2 mm inferiorly projecting vascular protrusion arising from the paraclinoid right ICA which may reflect an infundibulum or small aneurysm. Electronically Signed   By: Jackey Loge DO   On: 12/19/2020 15:55   MR BRAIN WO CONTRAST  Result Date: 12/23/2020 CLINICAL DATA:  Neuro deficit, acute, stroke suspected. EXAM: MRI HEAD WITHOUT CONTRAST  TECHNIQUE: Multiplanar, multiecho pulse sequences of the brain and surrounding structures were obtained without intravenous contrast. COMPARISON:  Head CT 12/23/2020. FINDINGS: Brain: Cerebral volume is normal. There has been interval increase in size of an acute/early subacute infarct within the paramedian left pons as compared to the brain MRI of 12/19/2020. This now measures 1.2 x 0.7 cm in transaxial dimensions (previously 1.1 x 0.2 cm in transaxial dimensions). A few small scattered foci of T2/FLAIR hyperintensity within the cerebral white matter are nonspecific, but compatible with chronic small vessel ischemic disease. Redemonstrated small chronic infarct within the left cerebellum. No evidence of intracranial mass. No chronic intracranial blood products. No extra-axial fluid collection. No midline shift. Vascular: Expected proximal arterial flow voids. Non dominant intracranial left vertebral artery. Skull and upper cervical spine: No focal marrow lesion. Sinuses/Orbits: Visualized orbits show no acute finding. Trace ethmoid sinus mucosal thickening. Small right maxillary sinus mucous retention cyst. IMPRESSION: 1.2 x 0.7 cm acute/early subacute infarct within the paramedian left pons, increased in size from the brain MRI of 12/19/2020 (previously 1.1 x 0.2 cm). Stable mild cerebral white matter chronic small vessel ischemic disease. Redemonstrated small chronic left cerebellar infarct. Electronically Signed   By: Jackey Loge DO   On: 12/23/2020 20:31   MR BRAIN WO CONTRAST  Result Date: 12/19/2020 CLINICAL DATA:  TIA.  Right-sided weakness.  Speech abnormality. EXAM: MRI HEAD WITHOUT CONTRAST TECHNIQUE: Multiplanar, multiecho pulse sequences of the brain and surrounding structures were obtained without intravenous contrast. COMPARISON:  CT head 12/19/2020 FINDINGS: Brain: Small diffusion hyperintensity in  the left lower pons. This appears restricted and compatible with a small acute infarct. No other  acute infarct. Ventricle size and cerebral volume normal. Small chronic infarct left cerebellum. Normal cerebral white matter. Negative for hemorrhage or mass. Vascular: Normal arterial flow voids Skull and upper cervical spine: Negative Sinuses/Orbits: Negative Other: None IMPRESSION: Small acute infarct left lower pons. Small chronic infarct left cerebellum. Electronically Signed   By: Marlan Palau M.D.   On: 12/19/2020 11:48   US Carotid Bilateral (at [facility] and [facility] only)  Result Date: 12/19/2020 CLINICAL DATA:  Cerebral infarction, syncope, hyperlipidemia. EXAM: BILATERAL CAROTID DUPLEX ULTRASOUND TECHNIQUE: Wallace Cullens scale imaging, color Doppler and duplex ultrasound were performed of bilateral carotid and vertebral arteries in the neck. COMPARISON:  None. FINDINGS: Criteria: Quantification of carotid stenosis is based on velocity parameters that correlate the residual internal carotid diameter with NASCET-based stenosis levels, using the diameter of the distal internal carotid lumen as the denominator for stenosis measurement. The following velocity measurements were obtained: RIGHT ICA:  77/27 cm/sec CCA:  68/23 cm/sec SYSTOLIC ICA/CCA RATIO:  1.1 ECA:  104 cm/sec LEFT ICA:  89/31 cm/sec CCA:  84/18 cm/sec SYSTOLIC ICA/CCA RATIO:  1.1 ECA:  89 cm/sec RIGHT CAROTID ARTERY: There is a mild amount of partially calcified plaque at the level of the carotid bulb likely just extending into the proximal right ICA. Estimated right ICA stenosis is less than 50%. RIGHT VERTEBRAL ARTERY: Antegrade flow with normal waveform and velocity. LEFT CAROTID ARTERY: No plaque at the level of the left internal carotid artery. Mild plaque at the origin of the external carotid artery. LEFT VERTEBRAL ARTERY: Antegrade flow with normal waveform and velocity. IMPRESSION: Mild plaque at the level of the right carotid bulb extending to the right ICA origin. Estimated right ICA stenosis is less than 50%. No evidence of left ICA stenosis.  Electronically Signed   By: Irish Lack M.D.   On: 12/19/2020 13:54   ECHOCARDIOGRAM COMPLETE  Result Date: 12/21/2020    ECHOCARDIOGRAM REPORT   Patient Name:   [name] Date of Exam: 12/20/2020 Medical Rec #:  [id number]       Height:       64.0 in Accession #:    [id number]      Weight:       179.9 lb Date of Birth:  08/22/1961        BSA:          1.870 m Patient Age:    59 years        BP:           163/72 mmHg Patient Gender: F               HR:           71 bpm. Exam Location:  [facility] Procedure: 2D Echo Indications:     Stroke 434.91/I163.9  History:         Patient has no prior history of Echocardiogram examinations.                  Risk Factors:Dyslipidemia.  Sonographer:     Johnathan Hausen Referring Phys:  [id number] AGBATA Diagnosing Phys: Arnoldo Hooker MD IMPRESSIONS  1. Left ventricular ejection fraction, by estimation, is 60 to 65%. The left ventricle has normal function. The left ventricle has no regional wall motion abnormalities. Left ventricular diastolic parameters were normal.  2. Right ventricular systolic function is normal. The right ventricular size is normal.  3. The  mitral valve is normal in structure. Trivial mitral valve regurgitation.  4. The aortic valve is normal in structure. Aortic valve regurgitation is trivial. FINDINGS  Left Ventricle: Left ventricular ejection fraction, by estimation, is 60 to 65%. The left ventricle has normal function. The left ventricle has no regional wall motion abnormalities. The left ventricular internal cavity size was normal in size. There is  no left ventricular hypertrophy. Left ventricular diastolic parameters were normal. Right Ventricle: The right ventricular size is normal. No increase in right ventricular wall thickness. Right ventricular systolic function is normal. Left Atrium: Left atrial size was normal in size. Right Atrium: Right atrial size was normal in size. Pericardium: There is no evidence of pericardial effusion.  Mitral Valve: The mitral valve is normal in structure. Trivial mitral valve regurgitation. Tricuspid Valve: The tricuspid valve is normal in structure. Tricuspid valve regurgitation is trivial. Aortic Valve: The aortic valve is normal in structure. Aortic valve regurgitation is trivial. Pulmonic Valve: The pulmonic valve was normal in structure. Pulmonic valve regurgitation is not visualized. Aorta: The aortic root and ascending aorta are structurally normal, with no evidence of dilitation. IAS/Shunts: No atrial level shunt detected by color flow Doppler.  LEFT VENTRICLE PLAX 2D LVIDd:         4.26 cm  Diastology LVIDs:         2.85 cm  LV e' medial:    11.00 cm/s LV PW:         1.20 cm  LV E/e' medial:  7.0 LV IVS:        0.92 cm  LV e' lateral:   8.81 cm/s LVOT diam:     2.10 cm  LV E/e' lateral: 8.8 LVOT Area:     3.46 cm  RIGHT VENTRICLE             IVC RV S prime:     17.80 cm/s  IVC diam: 1.43 cm TAPSE (M-mode): 2.8 cm LEFT ATRIUM             Index       RIGHT ATRIUM           Index LA diam:        3.90 cm 2.09 cm/m  RA Area:     13.10 cm LA Vol (A2C):   50.0 ml 26.73 ml/m RA Volume:   28.50 ml  15.24 ml/m LA Vol (A4C):   32.0 ml 17.11 ml/m LA Biplane Vol: 42.5 ml 22.72 ml/m   AORTA Ao Root diam: 3.60 cm MITRAL VALVE MV Area (PHT): 3.58 cm    SHUNTS MV Decel Time: 212 msec    Systemic Diam: 2.10 cm MV E velocity: 77.10 cm/s MV A velocity: 93.50 cm/s MV E/A ratio:  0.82 Arnoldo Hooker MD Electronically signed by Arnoldo Hooker MD Signature Date/Time: 12/21/2020/7:24:12 AM    Final        Subjective: No new complaints.   Discharge Exam: Vitals:   12/26/20 0559 12/26/20 0840  BP: 122/63 135/70  Pulse: 66 64  Resp: 15   Temp: 98.5 F (36.9 C) 98 F (36.7 C)  SpO2: 98% 98%   Vitals:   12/25/20 1614 12/25/20 2103 12/26/20 0559 12/26/20 0840  BP: (!) 125/56 140/66 122/63 135/70  Pulse: 72 69 66 64  Resp: 20 15 15    Temp:  98.6 F (37 C) 98.5 F (36.9 C) 98 F (36.7 C)  TempSrc:   Oral Oral Oral  SpO2: 93% 98% 98% 98%  Weight:  79.3 kg     General: Pt is alert, awake, not in acute distress Cardiovascular: RRR, S1/S2 +, no rubs, no gallops Respiratory: CTA bilaterally, no wheezing, no rhonchi Abdominal: Soft, NT, ND, bowel sounds + Extremities: no edema, no cyanosis    The results of significant diagnostics from this hospitalization (including imaging, microbiology, ancillary and laboratory) are listed below for reference.     Microbiology: Recent Results (from the past 240 hour(s))  SARS Coronavirus 2 by RT PCR (hospital order, performed in [facility] hospital lab) Nasopharyngeal Nasopharyngeal Swab     Status: None   Collection Time: 12/19/20  9:35 AM   Specimen: Nasopharyngeal Swab  Result Value Ref Range Status   SARS Coronavirus 2 NEGATIVE NEGATIVE Final    Comment: (NOTE) SARS-CoV-2 target nucleic acids are NOT DETECTED.  The SARS-CoV-2 RNA is generally detectable in upper and lower respiratory specimens during the acute phase of infection. The lowest concentration of SARS-CoV-2 viral copies this assay can detect is 250 copies / mL. A negative result does not preclude SARS-CoV-2 infection and should not be used as the sole basis for treatment or other patient management decisions.  A negative result may occur with improper specimen collection / handling, submission of specimen other than nasopharyngeal swab, presence of viral mutation(s) within the areas targeted by this assay, and inadequate number of viral copies (<250 copies / mL). A negative result must be combined with clinical observations, patient history, and epidemiological information.  Fact Sheet for Patients:   BoilerBrush.com.cy  Fact Sheet for Healthcare Providers: https://pope.com/  This test is not yet approved or  cleared by the [place] FDA and has been authorized for detection and/or diagnosis of SARS-CoV-2 by FDA under  an Emergency Use Authorization (EUA).  This EUA will remain in effect (meaning this test can be used) for the duration of the COVID-19 declaration under Section 564(b)(1) of the Act, 21 U.S.C. section 360bbb-3(b)(1), unless the authorization is terminated or revoked sooner.  Performed at [facility], [address] [place], [place] [address]   SARS CORONAVIRUS 2 (TAT 6-24 HRS) Nasopharyngeal Nasopharyngeal Swab     Status: None   Collection Time: 12/23/20  3:38 PM   Specimen: Nasopharyngeal Swab  Result Value Ref Range Status   SARS Coronavirus 2 NEGATIVE NEGATIVE Final    Comment: (NOTE) SARS-CoV-2 target nucleic acids are NOT DETECTED.  The SARS-CoV-2 RNA is generally detectable in upper and lower respiratory specimens during the acute phase of infection. Negative results do not preclude SARS-CoV-2 infection, do not rule out co-infections with other pathogens, and should not be used as the sole basis for treatment or other patient management decisions. Negative results must be combined with clinical observations, patient history, and epidemiological information. The expected result is Negative.  Fact Sheet for Patients: HairSlick.no  Fact Sheet for Healthcare Providers: quierodirigir.com  This test is not yet approved or cleared by the [place] FDA and  has been authorized for detection and/or diagnosis of SARS-CoV-2 by FDA under an Emergency Use Authorization (EUA). This EUA will remain  in effect (meaning this test can be used) for the duration of the COVID-19 declaration under Se ction 564(b)(1) of the Act, 21 U.S.C. section 360bbb-3(b)(1), unless the authorization is terminated or revoked sooner.  Performed at [facility], [address], [place], [place] [address]      Labs: BNP (last 3 results) No results for input(s): BNP in the last 8760 hours. Basic Metabolic Panel: Recent Labs  Lab 12/23/20 0909  NA 140  K 3.7  CL 103  CO2 26  GLUCOSE 134*  BUN 20  CREATININE 0.78  CALCIUM 9.3   Liver Function Tests: Recent Labs  Lab 12/23/20 0909  AST 21  ALT 16  ALKPHOS 56  BILITOT 0.8  PROT 7.1  ALBUMIN 4.2   No results for input(s): LIPASE, AMYLASE in the last 168 hours. No results for input(s): AMMONIA in the last 168 hours. CBC: Recent Labs  Lab 12/23/20 0909  WBC 7.2  NEUTROABS 4.0  HGB 13.7  HCT 40.7  MCV 89.1  PLT 275   Cardiac Enzymes: No results for input(s): CKTOTAL, CKMB, CKMBINDEX, TROPONINI in the last 168 hours. BNP: Invalid input(s): POCBNP CBG: Recent Labs  Lab 12/23/20 0905  GLUCAP 129*   D-Dimer No results for input(s): DDIMER in the last 72 hours. Hgb A1c Recent Labs    12/24/20 0412  HGBA1C 5.6   Lipid Profile Recent Labs    12/24/20 0412  CHOL 168  HDL 68  LDLCALC 86  TRIG 70  CHOLHDL 2.5   Thyroid function studies No results for input(s): TSH, T4TOTAL, T3FREE, THYROIDAB in the last 72 hours.  Invalid input(s): FREET3 Anemia work up No results for input(s): VITAMINB12, FOLATE, FERRITIN, TIBC, IRON, RETICCTPCT in the last 72 hours. Urinalysis    Component Value Date/Time   COLORURINE YELLOW (A) 12/24/2020 0420   APPEARANCEUR CLEAR (A) 12/24/2020 0420   LABSPEC 1.018 12/24/2020 0420   PHURINE 5.0 12/24/2020 0420   GLUCOSEU NEGATIVE 12/24/2020 0420   HGBUR NEGATIVE 12/24/2020 0420   BILIRUBINUR NEGATIVE 12/24/2020 0420   BILIRUBINUR neg 09/17/2020 0852   KETONESUR NEGATIVE 12/24/2020 0420   PROTEINUR NEGATIVE 12/24/2020 0420   UROBILINOGEN 0.2 09/17/2020 0852   NITRITE NEGATIVE 12/24/2020 0420   LEUKOCYTESUR TRACE (A) 12/24/2020 0420   Sepsis Labs Invalid input(s): PROCALCITONIN,  WBC,  LACTICIDVEN Microbiology Recent Results (from the past 240 hour(s))  SARS Coronavirus 2 by RT PCR (hospital order, performed in [facility] hospital lab) Nasopharyngeal Nasopharyngeal Swab     Status: None    Collection Time: 12/19/20  9:35 AM   Specimen: Nasopharyngeal Swab  Result Value Ref Range Status   SARS Coronavirus 2 NEGATIVE NEGATIVE Final    Comment: (NOTE) SARS-CoV-2 target nucleic acids are NOT DETECTED.  The SARS-CoV-2 RNA is generally detectable in upper and lower respiratory specimens during the acute phase of infection. The lowest concentration of SARS-CoV-2 viral copies this assay can detect is 250 copies / mL. A negative result does not preclude SARS-CoV-2 infection and should not be used as the sole basis for treatment or other patient management decisions.  A negative result may occur with improper specimen collection / handling, submission of specimen other than nasopharyngeal swab, presence of viral mutation(s) within the areas targeted by this assay, and inadequate number of viral copies (<250 copies / mL). A negative result must be combined with clinical observations, patient history, and epidemiological information.  Fact Sheet for Patients:   BoilerBrush.com.cy  Fact Sheet for Healthcare Providers: https://pope.com/  This test is not yet approved or  cleared by the [place] FDA and has been authorized for detection and/or diagnosis of SARS-CoV-2 by FDA under an Emergency Use Authorization (EUA).  This EUA will remain in effect (meaning this test can be used) for the duration of the COVID-19 declaration under Section 564(b)(1) of the Act, 21 U.S.C. section 360bbb-3(b)(1), unless the authorization is terminated or revoked sooner.  Performed at [employer]  [facility], [address] [place], [place] [address]   SARS CORONAVIRUS 2 (TAT 6-24 HRS) Nasopharyngeal Nasopharyngeal Swab     Status: None   Collection Time: 12/23/20  3:38 PM   Specimen: Nasopharyngeal Swab  Result Value Ref Range Status   SARS Coronavirus 2 NEGATIVE NEGATIVE Final    Comment: (NOTE) SARS-CoV-2 target nucleic acids are NOT  DETECTED.  The SARS-CoV-2 RNA is generally detectable in upper and lower respiratory specimens during the acute phase of infection. Negative results do not preclude SARS-CoV-2 infection, do not rule out co-infections with other pathogens, and should not be used as the sole basis for treatment or other patient management decisions. Negative results must be combined with clinical observations, patient history, and epidemiological information. The expected result is Negative.  Fact Sheet for Patients: HairSlick.no  Fact Sheet for Healthcare Providers: quierodirigir.com  This test is not yet approved or cleared by the [place] FDA and  has been authorized for detection and/or diagnosis of SARS-CoV-2 by FDA under an Emergency Use Authorization (EUA). This EUA will remain  in effect (meaning this test can be used) for the duration of the COVID-19 declaration under Se ction 564(b)(1) of the Act, 21 U.S.C. section 360bbb-3(b)(1), unless the authorization is terminated or revoked sooner.  Performed at [facility], [address] [place], [place] [address]      Time coordinating discharge: Over 32 minutes.   SIGNED:   Kathlen Mody, MD  [facility] 12/26/2020, 10:30 AM

## 2020-12-26 NOTE — Progress Notes (Signed)
Pt arrived to unit via EMS. Pt is alert and oriented, able to make needs known, lungs clear, husband at bed side

## 2020-12-26 NOTE — IPOC Note (Signed)
Individualized overall Plan of Care [facility]) Patient Details Name: [name] MRN: [id number] DOB: 1961/10/23  Admitting Diagnosis: Left pontine stroke [facility])  Hospital Problems: Principal Problem:   Left pontine stroke (HCC) Active Problems:   Strain of right rotator cuff capsule   Migraine without status migrainosus, not intractable   Uncomplicated asthma   Right hemiparesis (HCC)     Functional Problem List: Nursing Pain,Bowel,Endurance  PT Balance,Endurance,Motor,Safety,Sensory,Skin Integrity  OT Balance,Endurance,Motor,Pain  SLP    TR         Basic ADL's: OT Eating,Grooming,Bathing,Dressing,Toileting     Advanced  ADL's: OT Simple Meal Preparation     Transfers: PT Bed Mobility,Bed to Chair,Car  OT Toilet,Tub/Shower     Locomotion: PT Ambulation,Stairs     Additional Impairments: OT Fuctional Use of Upper Extremity  SLP        TR      Anticipated Outcomes Item Anticipated Outcome  Self Feeding modified independent  Swallowing      Basic self-care  supervision  Toileting  supervision   Bathroom Transfers supervision  Bowel/Bladder  to be continent x 2  Transfers  Spv  Locomotion  Spv  Communication     Cognition     Pain  less than 3 out of 10  Safety/Judgment  to remain fall free while in rehab   Therapy Plan: PT Intensity: Minimum of 1-2 x/day ,45 to 90 minutes PT Frequency: 5 out of 7 days PT Duration Estimated Length of Stay: 1.5-2 weeks OT Intensity: Minimum of 1-2 x/day, 45 to 90 minutes OT Frequency: 5 out of 7 days OT Duration/Estimated Length of Stay: 12-14 days      Team Interventions: Nursing Interventions Patient/Family Education,Disease Management/Prevention,Skin Care/Wound Management,Discharge Planning,Pain Management,Bowel Management,Medication Management  PT interventions Ambulation/gait training,Discharge planning,Functional mobility training,Psychosocial support,Therapeutic Activities,Visual/perceptual  remediation/compensation,Balance/vestibular training,Neuromuscular re-education,Disease management/prevention,Skin care/wound management,Therapeutic Exercise,Wheelchair propulsion/positioning,Cognitive remediation/compensation,DME/adaptive equipment instruction,Pain management,Splinting/orthotics,UE/LE Strength taining/ROM,Community reintegration,Functional electrical stimulation,Patient/family education,Stair training,UE/LE Coordination activities  OT Interventions Balance/vestibular training,Community reintegration,DME/adaptive equipment instruction,Disease mangement/prevention,Discharge planning,Functional electrical stimulation,Functional mobility training,Neuromuscular re-education,Patient/family education,Self Care/advanced ADL retraining,Therapeutic Activities,UE/LE Coordination activities,Therapeutic Exercise,UE/LE Strength taining/ROM,Splinting/orthotics,Wheelchair propulsion/positioning  SLP Interventions    TR Interventions    SW/CM Interventions     Barriers to Discharge MD  Medical stability and Prior RTC injury  Nursing      PT      OT      SLP      SW       Team Discharge Planning: Destination: PT-Home ,OT- Home , SLP-  Projected Follow-up: PT-Outpatient PT,24 hour supervision/assistance, OT-  Outpatient OT, SLP-  Projected Equipment Needs: PT-Rolling walker with 5" wheels, OT- To be determined, SLP-  Equipment Details: PT- , OT-  Patient/family involved in discharge planning: PT- Patient,  OT-Patient, SLP-   MD ELOS: 12-15 days. Medical Rehab Prognosis:  Good Assessment: 60 year female with history of migraines, vitamin D deficiency whowas originally seen at [facility] 12/19/20 with HA, right facial droop and right sided weakness. Work up revealed acute left pontine infarct and symptoms resolved prior to d/c home the next day. She was readmitted on 12/23/20 with slurred speech and worsening of right sided weakness. MRI brain done revealing acute/subacute infarct left paramedian  pons increased insizie c/w 12/19/20 and small chronic left cerebellar infarct. CTA head/neck showed acute infarct left paracentral pons without hemorrhage. Patient not tPA candidate and no LVO so no intervention offered. Stroke felt to be due small vessel disease and patient treated with IVF to keep SBP>140. To continue DAPT X 3 weeks followed  by ASA alone and "CIR as main stay of treatment". Patient with resulting functional deficits with mobility, balance, endurance, self-care.  Will set goals for Supervision with PT/OT.   Due to the current state of emergency, patients may not be receiving their 3-hours of Medicare-mandated therapy.  See Team Conference Notes for weekly updates to the plan of care

## 2020-12-26 NOTE — Progress Notes (Signed)
Ranelle Oyster, MD  Physician  Physical Medicine and Rehabilitation  PMR Pre-admission     Signed  Date of Service:  12/26/2020 10:43 AM      Related encounter: ED to Hosp-Admission (Discharged) from 12/23/2020 in [facility]       Signed          Show:Clear all [x] Manual[x] Template[] Copied  Added by: [x] , RN[x] , MD   [] Hover for details  PMR Admission Coordinator Pre-Admission Assessment   Patient: [name] is an 60 y.o., female MRN: DOB: 14-Apr-1961 Height:   Weight: 79.3 kg   Insurance Information   PRIMARY: uninsured; states insurance begins 12/30/2020. I have asked her to provide me with her insurance so that on 12/30/2020 we can file insurance for possible coverage beginning 12/30/2020. Patient given Good Faith Estimate on 12/26/2020        Financial Counselor:       Phone#:    The "Data Collection Information Summary" for patients in Inpatient Rehabilitation Facilities with attached "Privacy Act Statement-Health Care Records" was provided and verbally reviewed with: N/A   Emergency Contact Information         Contact Information     Name Relation Home Work Mobile    Ovilla Spouse     [phone]    12/28/2020     [phone]         Current Medical History  Patient Admitting Diagnosis: CVA   History of Present Illness: 60 year old female with medical history significant for recent pontine stroke, history of migraine headaches who presented on 12/23/2020 via private vehicle for worsening right sided weakness involving her right upper and lower extremity as well as slurred speech. Patient was recently discharged on 12/20/2020 following  hospitalization for an acute left pontine infarct. She had right sided weakness at that time that had resolved prior to her discharge.    MRI of brain shows 1.2 x 0.7 cm acute/early subacute infarct within the paramedian left pons,  increased in size from the brain MRI of 12/19/20 ( previously 1.1 x 0.2 c,). Neurology consulted. Resume Aspirin, Plavix and statin for 3 weeks and then aspirin alone. Avoid hypotension. Recent 2D echo showed EF 60 to 65 % with no evidence of thrombus. Topamax for migraines. Lower extremity weakness has improved since admission. Plan follow up with Neurology as an outpatient.    Complete NIHSS TOTAL: 5   Patient's medical record from [facility] has been reviewed by the rehabilitation admission coordinator and physician.   Past Medical History      Past Medical History:  Diagnosis Date  . Arthritis      hands, feet, knees  . Asthma    . Migraine    . Mood change      due to menopause/ hot flashes  . PONV (postoperative nausea and vomiting)        Family History   family history includes Breast cancer (age of onset: 91) in her paternal grandmother; CAD in her father; Diabetes in her paternal uncle.   Prior Rehab/Hospitalizations Has the patient had prior rehab or hospitalizations prior to admission? Yes   Has the patient had major surgery during 100 days prior to admission? Yes              Current Medications   Current Facility-Administered Medications:  .   stroke: mapping our early stages of recovery book, , Does not apply, Once, Agbata, Tochukwu, MD .  0.9 %  sodium chloride infusion, , Intravenous, Continuous, Kathlen Mody, MD, Last Rate: 75 mL/hr at 12/26/20 0525, New Bag at 12/26/20 0525 .  acetaminophen (TYLENOL) tablet 650 mg, 650 mg, Oral, Q6H PRN, Agbata, Tochukwu, MD, 650 mg at 12/24/20 2037 .  albuterol (VENTOLIN HFA) 108 (90 Base) MCG/ACT inhaler 2 puff, 2 puff, Inhalation, Q6H PRN, Agbata, Tochukwu, MD .  aspirin EC tablet 81 mg, 81 mg, Oral, Daily, Agbata, Tochukwu, MD, 81 mg at 12/26/20 0842 .  atorvastatin (LIPITOR) tablet 80 mg, 80 mg, Oral, QHS, Agbata, Tochukwu, MD, 80 mg at 12/25/20 2110 .  cholecalciferol (VITAMIN D) tablet 1,000 Units, 1,000 Units, Oral, Daily,  Agbata, Tochukwu, MD, 1,000 Units at 12/26/20 0841 .  citalopram (CELEXA) tablet 20 mg, 20 mg, Oral, Daily, Agbata, Tochukwu, MD, 20 mg at 12/26/20 0841 .  clopidogrel (PLAVIX) tablet 75 mg, 75 mg, Oral, Daily, Ronnald Ramp, RPH, 75 mg at 12/26/20 0843 .  fluticasone (FLONASE) 50 MCG/ACT nasal spray 1 spray, 1 spray, Each Nare, Daily PRN, Agbata, Tochukwu, MD .  loratadine (CLARITIN) tablet 10 mg, 10 mg, Oral, Daily, Agbata, Tochukwu, MD, 10 mg at 12/26/20 0841 .  melatonin tablet 5-10 mg, 5-10 mg, Oral, QHS PRN, Agbata, Tochukwu, MD, 10 mg at 12/24/20 2037 .  montelukast (SINGULAIR) tablet 10 mg, 10 mg, Oral, QHS, Agbata, Tochukwu, MD .  multivitamin with minerals tablet 1 tablet, 1 tablet, Oral, Daily, Agbata, Tochukwu, MD, 1 tablet at 12/26/20 0842 .  ondansetron (ZOFRAN) injection 4 mg, 4 mg, Intravenous, Q6H PRN, Kathlen Mody, MD, 4 mg at 12/26/20 0843 .  topiramate (TOPAMAX) tablet 25 mg, 25 mg, Oral, Daily, Agbata, Tochukwu, MD, 25 mg at 12/26/20 0848 .  traMADol (ULTRAM) tablet 50 mg, 50 mg, Oral, Q4H PRN, Mansy, Jan A, MD, 50 mg at 12/26/20 [phone] .  vitamin B-12 (CYANOCOBALAMIN) tablet 500 mcg, 500 mcg, Oral, Daily, Agbata, Tochukwu, MD, 500 mcg at 12/26/20 0841   Patients Current Diet:     Diet Order                      Diet - low sodium heart healthy              Diet 2 gram sodium Room service appropriate? Yes; Fluid consistency: Thin  Diet effective now                      Precautions / Restrictions Precautions Precautions: Fall Precaution Comments: R hemi Restrictions Weight Bearing Restrictions: No    Has the patient had 2 or more falls or a fall with injury in the past year? No   Prior Activity Level Community (5-7x/wk): works remote for [employer]; Independent and driving   Prior Functional Level Self Care: Did the patient need help bathing, dressing, using the toilet or eating? Independent   Indoor Mobility: Did the patient need assistance with walking from  room to room (with or without device)? Independent   Stairs: Did the patient need assistance with internal or external stairs (with or without device)? Independent   Functional Cognition: Did the patient need help planning regular tasks such as shopping or remembering to take medications? Independent   Home Assistive Devices / Equipment Home Assistive Devices/Equipment: None Home Equipment: None   Prior Device Use: Indicate devices/aids used by the patient prior to current illness, exacerbation or injury? None of the above   Current Functional Level Cognition   Overall Cognitive Status: Within Functional Limits for tasks assessed  Orientation Level: Oriented X4 General Comments: Pt is A and O x 4 and extremely motivated.    Extremity Assessment (includes Sensation/Coordination)   Upper Extremity Assessment: Defer to OT evaluation,RUE deficits/detail RUE Deficits / Details: per OT eval "2-/5 in gravity eliminated position"; 0/5 R grip strength/finger flexion and finger extension RUE Sensation: WNL RUE Coordination: decreased fine motor,decreased gross motor  Lower Extremity Assessment: RLE deficits/detail (L LE WFL; intact B LE proprioception and light touch) RLE Deficits / Details: able to perform SLR with decreased quality; hip flexion 2+/5; knee extension 2+/5; knee flexion 3-/5; DF 0/5 (able to slightly wiggle toes); PF 2+/5; increased tone noted R LE; R DF to neutral PROM (limited d/t increased R LE tone); decreased R LE heel to shin coordination RLE Coordination: decreased gross motor,decreased fine motor     ADLs   Overall ADL's : Needs assistance/impaired Eating/Feeding: Set up,Supervision/ safety,Sitting Grooming: Oral care,Standing,Min guard,Supervision/safety Grooming Details (indicate cue type and reason): weight bearing through R LE as well General ADL Comments: Pt needs assistance to open containers, cut food, and is feeding self with L UE. Pt needing hand over hand  assistance or pt utilizing L UE to move R UE for self care tasks.     Mobility   Overal bed mobility: Needs Assistance Bed Mobility: Supine to Sit,Sit to Supine Sidelying to sit: Min guard Supine to sit: Min guard Sit to supine: Min guard General bed mobility comments: min cuing for technique and hand placement     Transfers   Overall transfer level: Needs assistance Equipment used: None Transfers: Sit to/from Stand Sit to Stand: Min assist,Mod assist Stand pivot transfers: Mod assist General transfer comment: Pt was able to STS 3 x EOb throughout session. first trial STS was to IV pole however pt demonstrated improved abilities with use of RW.     Ambulation / Gait / Stairs / Wheelchair Mobility   Ambulation/Gait Ambulation/Gait assistance: Min assist,Min guard Educational psychologistGait Distance (Feet): 30 Feet Assistive device: IV Pole,Rolling walker (2 wheeled) Gait Pattern/deviations: Decreased step length - right,Decreased stance time - right,Decreased stride length (poor active dorsiflexion) General Gait Details: Pt was able to ambulate 3 x with 1 trial just using IV pole and twpo trials with RW. much improve gait kinematics with use of RW. Does require assistance and Vcs for slowing down to improve technique. Gait velocity: WNL     Posture / Balance Dynamic Sitting Balance Sitting balance - Comments: steady sitting reaching within BOS Balance Overall balance assessment: Needs assistance Sitting-balance support: Feet unsupported,Single extremity supported Sitting balance-Leahy Scale: Good Sitting balance - Comments: steady sitting reaching within BOS Standing balance support: During functional activity Standing balance-Leahy Scale: Poor Standing balance comment: High fall risk. RLE weakness (concerns for buckling)     Special needs/care consideration Designated visitor is spouse, Casimiro NeedleMichael    Previous Home Environment  Living Arrangements: Spouse/significant other  Lives With:  Spouse Available Help at Discharge: Family,Available 24 hours/day (Husband retired) Type of Home: House Home Layout: One level Home Access: Stairs to enter Entrance Stairs-Rails: Left,Right,Can reach both Secretary/administratorntrance Stairs-Number of Steps: 3 Bathroom Shower/Tub: Engineer, manufacturing systemsTub/shower unit Bathroom Toilet: Standard Bathroom Accessibility: Yes How Accessible: Accessible via walker Home Care Services: No   Discharge Living Setting Plans for Discharge Living Setting: Patient's home,Lives with (comment) (spouse) Type of Home at Discharge: House Discharge Home Layout: One level Discharge Home Access: Stairs to enter Entrance Stairs-Rails: Right,Left,Can reach both Entrance Stairs-Number of Steps: 3 Discharge Bathroom Shower/Tub: Tub/shower unit Discharge Bathroom Toilet:  Standard Discharge Bathroom Accessibility: Yes How Accessible: Accessible via walker Does the patient have any problems obtaining your medications?: No   Social/Family/Support Systems Contact Information: spouse, Casimiro Needle Anticipated Caregiver: spouse Anticipated Caregiver's Contact Information: see above Ability/Limitations of Caregiver: spouse retired Engineer, structural Availability: 24/7 Discharge Plan Discussed with Primary Caregiver: Yes Is Caregiver In Agreement with Plan?: Yes Does Caregiver/Family have Issues with Lodging/Transportation while Pt is in Rehab?: No   Goals Patient/Family Goal for Rehab: Mod I to supervision with PT and OT Expected length of stay: ELOS 7 to 10 days Pt/Family Agrees to Admission and willing to participate: Yes Program Orientation Provided & Reviewed with Pt/Caregiver Including Roles  & Responsibilities: Yes   Decrease burden of Care through IP rehab admission: n/a   Possible need for SNF placement upon discharge: not anticipated   Patient Condition: I have reviewed medical records from [facility], spoken with  patient. I discussed via phone for inpatient rehabilitation assessment.  Patient will benefit  from ongoing PT and OT, can actively participate in 3 hours of therapy a day 5 days of the week, and can make measurable gains during the admission.  Patient will also benefit from the coordinated team approach during an Inpatient Acute Rehabilitation admission.  The patient will receive intensive therapy as well as Rehabilitation physician, nursing, social worker, and care management interventions.  Due to bladder management, bowel management, safety, skin/wound care, disease management, medication administration, pain management and patient education the patient requires 24 hour a day rehabilitation nursing.  The patient is currently min assist overall with mobility and basic ADLs.  Discharge setting and therapy post discharge at home with home health is anticipated.  Patient has agreed to participate in the Acute Inpatient Rehabilitation Program and will admit today.   Preadmission Screen Completed By:  Clois Dupes, 12/26/2020 11:22 AM ______________________________________________________________________   Discussed status with Dr. Allena Katz  on  12/26/2020 at  1110 and received approval for admission today.   Admission Coordinator:  Clois Dupes, RN, time  [id number] Date  12/26/2020    Assessment/Plan: Diagnosis: left pontine infarct 1. Does the need for close, 24 hr/day Medical supervision in concert with the patient's rehab needs make it unreasonable for this patient to be served in a less intensive setting? Yes 2. Co-Morbidities requiring supervision/potential complications: Migraines, post-stroke sequelae 3. Due to bladder management, bowel management, safety, skin/wound care, disease management, medication administration, pain management and patient education, does the patient require 24 hr/day rehab nursing? Yes 4. Does the patient require coordinated care of a physician, rehab nurse, PT, OT, and SLP to address physical and functional deficits in the context of the above medical  diagnosis(es)? Yes Addressing deficits in the following areas: balance, endurance, locomotion, strength, transferring, bowel/bladder control, bathing, dressing, feeding, grooming, toileting and psychosocial support 5. Can the patient actively participate in an intensive therapy program of at least 3 hrs of therapy 5 days a week? Yes 6. The potential for patient to make measurable gains while on inpatient rehab is excellent 7. Anticipated functional outcomes upon discharge from inpatient rehab: modified independent and supervision PT, modified independent and supervision OT, n/a SLP 8. Estimated rehab length of stay to reach the above functional goals is: 7-10 days 9. Anticipated discharge destination: Home 10. Overall Rehab/Functional Prognosis: excellent     MD Signature: Ranelle Oyster, MD, [facility] Physical Medicine & Rehabilitation 12/26/2020           Revision History  Note Details  Author Ranelle Oyster, MD File Time 12/26/2020 11:26 AM  Author Type Physician Status Signed  Last Editor Ranelle Oyster, MD Service Physical Medicine and Rehabilitation  [facility] Acct # [id number] Admit Date 12/26/2020

## 2020-12-26 NOTE — H&P (Signed)
Physical Medicine and Rehabilitation Admission H&P        Chief Complaint  Patient presents with  . Stroke with functional deficits.       HPI: [name] is a 60 year female with history of migraines, vitamin D deficiency who was originally seen at [facility] 12/19/20 with HA, right facial droop and right sided weakness. Work up revealed acute left pontine infarct and symptoms resolved prior to d/c home the next day. She was readmitted on 12/23/20 with slurred speech and worsening of right sided weakness. MRI brain done revealing acute/subacute infarct left paramedian pons increased insizie c/w 12/19/20 and small chronic left cerebellar infarct.  CTA head/neck showed acute infarct left paracentral pons without hemorrhage. Patient not tPA candidate and no LVO so no intervention offered. Stroke felt to be due small vessel disease and patient treated with IVF to keep SBP>140.  To continue DAPT X 3 weeks followed by ASA alone and "CIR as main stay of treatment"           Review of Systems  Constitutional: Negative for chills and fever.  HENT: Negative for hearing loss and tinnitus.   Eyes: Negative for blurred vision and double vision.  Respiratory: Negative for cough and shortness of breath.   Cardiovascular: Negative for chest pain and palpitations.  Gastrointestinal: Positive for constipation. Negative for heartburn and nausea.  Musculoskeletal: Positive for back pain, joint pain (injured right shoulder over holidays) and myalgias.  Skin: Negative for itching and rash.  Neurological: Positive for focal weakness. Negative for dizziness and headaches.  Psychiatric/Behavioral: The patient does not have insomnia.           Past Medical History:  Diagnosis Date  . Arthritis      hands, feet, knees  . Asthma    . Migraine    . Mood change      due to menopause/ hot flashes  . PONV (postoperative nausea and vomiting)             Past Surgical History:  Procedure Laterality  Date  . ABDOMINAL HYSTERECTOMY      . APPENDECTOMY      . CESAREAN SECTION        x 1  . COLONOSCOPY   2012    due in 2017 for suspicious lesion  . COLONOSCOPY WITH PROPOFOL N/A 08/20/2016    Procedure: COLONOSCOPY WITH PROPOFOL;  Surgeon: Midge Minium, MD;  Location: [facility];  Service: Endoscopy;  Laterality: N/A;  . PARTIAL HYSTERECTOMY        one ovary left  . REPAIR ANKLE LIGAMENT        from fall  . ROTATOR CUFF REPAIR Left      shoulder  . TONSILLECTOMY               Family History  Problem Relation Age of Onset  . Diabetes Paternal Uncle    . Breast cancer Paternal Grandmother 46  . CAD Father        Social History:  Married. 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 She reports that she has never smoked. She has never used smokeless tobacco. She reports current alcohol use--couple ounces of wine occasionally.  She reports that she does not use drugs.           Allergies  Allergen Reactions  . Clavulanic Acid Diarrhea  . Oxycodone Itching  . Tape Rash  Medications Prior to Admission  Medication Sig Dispense Refill  . acetaminophen (TYLENOL) 650 MG CR tablet Take 650 mg by mouth 2 (two) times daily as needed for pain.      Marland Kitchen albuterol (VENTOLIN HFA) 108 (90 Base) MCG/ACT inhaler Inhale 2 puffs into the lungs every 6 (six) hours as needed for wheezing or shortness of breath. 18 g 1  . aspirin EC 81 MG EC tablet Take 1 tablet (81 mg total) by mouth daily. Swallow whole. 30 tablet 11  . atorvastatin (LIPITOR) 80 MG tablet Take 1 tablet (80 mg total) by mouth daily. 90 tablet 3  . citalopram (CELEXA) 20 MG tablet Take 1 tablet (20 mg total) by mouth daily. 90 tablet 1  . clopidogrel (PLAVIX) 75 MG tablet Take 1 tablet (75 mg total) by mouth daily for 21 days. 21 tablet 0  . cyanocobalamin 500 MCG tablet Take 500 mcg by mouth daily.      . melatonin 5 MG TABS Take 5-10 mg by mouth at bedtime as needed (sleep).      . Multiple Vitamins-Minerals  (MULTIVITAMIN ADULTS 50+ PO) Take 1 capsule by mouth daily.      Marland Kitchen tiZANidine (ZANAFLEX) 4 MG tablet Take 1 tablet (4 mg total) by mouth 3 (three) times daily. 30 tablet 0  . topiramate (TOPAMAX) 25 MG tablet Take 1 tablet (25 mg total) by mouth daily. 90 tablet 3  . Triamcinolone Acetonide (NASACORT ALLERGY 24HR NA) Place 1-2 sprays into both nostrils daily as needed (allergies).      . Vitamin D, Cholecalciferol, 1000 UNITS CAPS Take 1,000 Units by mouth daily.      Marland Kitchen albuterol (PROVENTIL) (2.5 MG/3ML) 0.083% nebulizer solution Take 3 mLs (2.5 mg total) by nebulization every 6 (six) hours as needed for wheezing or shortness of breath. 150 mL 1  . cetirizine (ZYRTEC) 10 MG chewable tablet Chew 10 mg by mouth daily as needed for allergies.      . montelukast (SINGULAIR) 10 MG tablet Take 1 tablet (10 mg total) by mouth at bedtime. (Patient not taking: Reported on 12/23/2020) 90 tablet 3      Drug Regimen Review  Drug regimen was reviewed and remains appropriate with no significant issues identified   Home: Home Living Family/patient expects to be discharged to:: Private residence Living Arrangements: Spouse/significant other Available Help at Discharge: Family,Available 24 hours/day (Husband retired) Type of Home: House Home Access: Stairs to enter Secretary/administrator of Steps: 3 Entrance Stairs-Rails: Left,Right,Can reach both Home Layout: One level Bathroom Shower/Tub: Associate Professor: Yes Home Equipment: None  Lives With: Spouse   Functional History: Prior Function Level of Independence: Independent Comments: 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 first trial STS was to IV pole however pt demonstrated improved abilities with use of RW. Ambulation/Gait Ambulation/Gait assistance: Min assist,Min guard Gait Distance (Feet): 30 Feet Assistive device: IV Pole,Rolling walker (2 wheeled) Gait Pattern/deviations: Decreased step length - right,Decreased stance time - right,Decreased stride length (  poor active dorsiflexion) General Gait Details: Pt was able to ambulate 3 x with 1 trial just using IV pole and twpo trials with RW. much improve gait kinematics with use of RW. Does require assistance and Vcs for slowing down to improve technique. Gait velocity: WNL   ADL: ADL Overall ADL's : Needs assistance/impaired Eating/Feeding: Set up,Supervision/ safety,Sitting Grooming: Oral care,Standing,Min guard,Supervision/safety Grooming Details (indicate cue type and reason): weight bearing through R LE as well General ADL Comments: Pt needs assistance to open containers, cut food, and is feeding self with L UE. Pt needing hand over hand assistance or pt utilizing L UE to move R UE for self care tasks.   Cognition: Cognition Overall Cognitive Status: Within Functional Limits for tasks assessed Orientation Level: Oriented X4 Cognition Arousal/Alertness: Awake/alert Behavior During Therapy: WFL for tasks assessed/performed Overall Cognitive Status: Within Functional Limits for tasks assessed General Comments: Pt is A and O x 4 and extremely motivated.     Blood pressure 135/70, pulse 64, temperature 98 F (36.7 C), temperature source Oral, resp. rate 15, weight 79.3 kg, SpO2 98 %. Physical Exam Vitals and nursing note reviewed.  Constitutional:      Appearance: Normal appearance.      Comments: Pleasant and appropriate.  HENT:     Head: Normocephalic.     Nose: Nose normal.     Mouth/Throat:     Pharynx: No oropharyngeal exudate.  Eyes:     Extraocular Movements: Extraocular movements intact.     Pupils: Pupils are equal, round, and reactive to light.  Cardiovascular:     Rate and Rhythm: Normal rate and regular rhythm.     Heart sounds: No murmur heard. No gallop.   Pulmonary:     Effort: Pulmonary effort is normal. No respiratory distress.     Breath sounds: No wheezing.  Abdominal:     General: Bowel sounds are normal. There is no distension.     Palpations: Abdomen is soft.     Tenderness: There is no abdominal tenderness.  Musculoskeletal:     Comments: Mild pain with impingement maneuver right shoulder, mild pain with palpation along subacromial region. No sublux.   Skin:    General: Skin is warm and dry.  Neurological:     Mental Status: She is alert.     Comments: Minimal right central 7. Remainder of CN exam normal. Good insight and awareness. Functional memory. normal language. No dysarthria. RUE: Trace/5 deltoid, 1/5 pec and bicep, 0/5 tricep, wrist and HI. RLE 3/5 HF, KE and 1+/5 APF, 0/5 ADF. Right heel cord tight. DTR's increased on right. LUE and LLE 5/5.   Psychiatric:        Mood and Affect: Mood normal.        Behavior: Behavior normal.        Thought Content: Thought content normal.        Judgment: Judgment normal.        Lab Results Last 48 Hours  No results found for this or any previous visit (from the past 48 hour(s)).    Imaging Results (Last 48 hours)  CT ANGIO HEAD W OR WO CONTRAST   Result Date: 12/24/2020 CLINICAL DATA:  Stroke.  Right-sided weakness. EXAM: CT ANGIOGRAPHY HEAD AND NECK TECHNIQUE: Multidetector CT imaging of the head and neck was performed using the standard protocol during bolus administration of intravenous contrast. Multiplanar CT image reconstructions and MIPs were obtained to evaluate the vascular anatomy.  Carotid stenosis measurements (when applicable) are  obtained utilizing NASCET criteria, using the distal internal carotid diameter as the denominator. CONTRAST:  75mL OMNIPAQUE IOHEXOL 350 MG/ML SOLN COMPARISON:  MRI head 12/23/2020 FINDINGS: CT HEAD FINDINGS Brain: Acute infarct left paracentral pons best seen on MRI. This areas obscured by streak artifact on CT. Ventricle size normal. No acute hemorrhage. No other area of infarction identified. Vascular: Negative for hyperdense vessel Skull: Negative Sinuses: Paranasal sinuses clear. Orbits: Negative Review of the MIP images confirms the above findings CTA NECK FINDINGS Aortic arch: Standard branching. Imaged portion shows no evidence of aneurysm or dissection. No significant stenosis of the major arch vessel origins. Right carotid system: Right carotid widely patent without stenosis or significant atherosclerotic disease. Left carotid system: Left carotid widely patent without stenosis or significant atherosclerotic disease Vertebral arteries: Left vertebral artery dominant and widely patent to the basilar. Small right vertebral artery ends in PICA with minimal contribution to the basilar. Skeleton: Cervical spondylosis.  No acute skeletal abnormality. Other neck: Negative for mass or adenopathy. Upper chest: Lung apices clear bilaterally. Review of the MIP images confirms the above findings CTA HEAD FINDINGS Anterior circulation: Internal carotid artery widely patent through the skull base and cavernous segments. Anterior and middle cerebral arteries widely patent. Negative for large vessel occlusion or flow limiting stenosis. Posterior circulation: PICA patent bilaterally. Basilar supplied primarily by the left vertebral artery. Left AICA patent. Basilar widely patent. Superior cerebellar and posterior cerebral arteries patent bilaterally without stenosis. No significant atherosclerotic disease in the basilar artery. Venous sinuses: Normal venous enhancement  Anatomic variants: None Review of the MIP images confirms the above findings IMPRESSION: 1. Acute infarct left paracentral pons best seen on MRI yesterday. No acute hemorrhage on CT. 2. Negative CT angio head neck. No carotid or vertebral artery stenosis in the neck. 3. Normal basilar artery without atherosclerotic disease or stenosis. Electronically Signed   By: Marlan Palauharles  Clark M.D.   On: 12/24/2020 15:52    CT ANGIO NECK W OR WO CONTRAST   Result Date: 12/24/2020 CLINICAL DATA:  Stroke.  Right-sided weakness. EXAM: CT ANGIOGRAPHY HEAD AND NECK TECHNIQUE: Multidetector CT imaging of the head and neck was performed using the standard protocol during bolus administration of intravenous contrast. Multiplanar CT image reconstructions and MIPs were obtained to evaluate the vascular anatomy. Carotid stenosis measurements (when applicable) are obtained utilizing NASCET criteria, using the distal internal carotid diameter as the denominator. CONTRAST:  75mL OMNIPAQUE IOHEXOL 350 MG/ML SOLN COMPARISON:  MRI head 12/23/2020 FINDINGS: CT HEAD FINDINGS Brain: Acute infarct left paracentral pons best seen on MRI. This areas obscured by streak artifact on CT. Ventricle size normal. No acute hemorrhage. No other area of infarction identified. Vascular: Negative for hyperdense vessel Skull: Negative Sinuses: Paranasal sinuses clear. Orbits: Negative Review of the MIP images confirms the above findings CTA NECK FINDINGS Aortic arch: Standard branching. Imaged portion shows no evidence of aneurysm or dissection. No significant stenosis of the major arch vessel origins. Right carotid system: Right carotid widely patent without stenosis or significant atherosclerotic disease. Left carotid system: Left carotid widely patent without stenosis or significant atherosclerotic disease Vertebral arteries: Left vertebral artery dominant and widely patent to the basilar. Small right vertebral artery ends in PICA with minimal contribution to  the basilar. Skeleton: Cervical spondylosis.  No acute skeletal abnormality. Other neck: Negative for mass or adenopathy. Upper chest: Lung apices clear bilaterally. Review of the MIP images confirms the above findings CTA HEAD FINDINGS Anterior circulation: Internal carotid artery widely patent through the  skull base and cavernous segments. Anterior and middle cerebral arteries widely patent. Negative for large vessel occlusion or flow limiting stenosis. Posterior circulation: PICA patent bilaterally. Basilar supplied primarily by the left vertebral artery. Left AICA patent. Basilar widely patent. Superior cerebellar and posterior cerebral arteries patent bilaterally without stenosis. No significant atherosclerotic disease in the basilar artery. Venous sinuses: Normal venous enhancement Anatomic variants: None Review of the MIP images confirms the above findings IMPRESSION: 1. Acute infarct left paracentral pons best seen on MRI yesterday. No acute hemorrhage on CT. 2. Negative CT angio head neck. No carotid or vertebral artery stenosis in the neck. 3. Normal basilar artery without atherosclerotic disease or stenosis. Electronically Signed   By: Marlan Palau M.D.   On: 12/24/2020 15:52             Medical Problem List and Plan: 1.  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  Antithrombotics: -DVT/anticoagulation:  Pharmaceutical: Lovenox             -antiplatelet therapy: DAPT 3. Pain Management/ pre-morbid right RTC injury: tylenol for mild pain, tramadol for more severe pain             -add sports cream bid             -support right upper ext in bed/chair             -ROM exercises with PT/OT 4. Mood:  LCSW to follow for evaluation and support.              -antipsychotic agents: N/a 5. Neuropsych:  This patient is capable of making decisions on her own behalf. 6. Skin/Wound Care: Routine pressure relief measures.  7. Fluids/Electrolytes/Nutrition: Monitor I/O. Check lytes in am.  8. Blood pressures: Higher BP goal recommended and to avoid hypotension.  9. H/o Migraines: Continue Topamax. 10. 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 11. H/o depression: Stable on current dose Celexa.          Jacquelynn Cree, PA-C 12/26/2020  I have personally performed a face to face diagnostic evaluation of this patient and formulated the key components of the plan.  Additionally, I have personally reviewed laboratory data, imaging studies, as well as relevant notes and concur with the physician assistant's documentation above.  The patient's status has not changed from the original H&P.  Any changes in documentation from the acute care chart have been noted above.  Ranelle Oyster, MD, [place] [facility]

## 2020-12-26 NOTE — Progress Notes (Signed)
Orthopedic Tech Progress Note Patient Details:  [name] 1961/11/15 [id number] Called in order to [facility] for a PRAFO and RESTING WHO. Patient ID: [name], female   DOB: Mar 27, 1961, 60 y.o.   MRN: [id number]   Donald Pore 12/26/2020, 5:32 PM

## 2020-12-26 NOTE — Progress Notes (Signed)
Inpatient Rehabilitation Medication Review by a Pharmacist  A complete drug regimen review was completed for this patient to identify any potential clinically significant medication issues.  Clinically significant medication issues were identified:  no   Type of Medication Issue Identified Description of Issue Urgent (address now) Non-Urgent (address on AM team rounds) Plan   Drug Interaction(s) (clinically significant)       Duplicate Therapy       Allergy       No Medication Administration End Date       Incorrect Dose       Additional Drug Therapy Needed       Other  Home albuterol PRN, fluticasone PRN and tizanidine 4mg  TID held Non-urgent Resume if needed per clinical status        Time spent performing this drug regimen review (minutes):  15  , PharmD, [place], [facility] Clinical Pharmacist  Please check AMION for all [facility] phone numbers After 10:00 PM, call Main Pharmacy [phone]

## 2020-12-26 NOTE — Progress Notes (Signed)
Physical Therapy Treatment Patient Details Name: [name] MRN: [id number] DOB: 01-31-1961 Today's Date: 12/26/2020    History of Present Illness Pt is a 60 y.o. female presenting to hospital 1/25 with R sided weakness and difficulty with speech; recent admission with small L pontine stroke (weakness R arm and speech difficulties) but symptoms resolved by time of discharge 12/20/20.  Pt now admitted to hospital for stroke work-up.  MRI showing 1.2 x 0.7 cm acute/early subacute infarct w/in the paramedian L pons (increased in size from brain MRI 12/19/20).  PMH includes migraines, HLD, stroke, L RCR, and repair of ankle ligament.    PT Comments    Pt just finishing breakfast upon PT arrival; agreeable to PT session.  Able to progress to ambulating 80 feet and then 40 feet with RW CGA to min assist (decreased gait quality noted with increased distance ambulating;  pt sliding R foot on ground to advance).  Pt's R hand slipping off of walker so therapist built up handle on R with washcloths and placed non-slip pad on top which allowed pt's hand to stay on R walker handle (0/5 R hand grip strength but pt does demonstrate some R elbow flexion and extension strength).  Will continue to focus on strengthening and progressive functional mobility during hospitalization.   Follow Up Recommendations  CIR     Equipment Recommendations  3in1 (PT);Rolling walker with 5" wheels    Recommendations for Other Services OT consult     Precautions / Restrictions Precautions Precautions: Fall Restrictions Weight Bearing Restrictions: No    Mobility  Bed Mobility Overal bed mobility: Needs Assistance Bed Mobility: Supine to Sit     Supine to sit: Min guard     General bed mobility comments: increased time to perform on own (getting up on L side of bed)  Transfers Overall transfer level: Needs assistance Equipment used: Rolling walker (2 wheeled) Transfers: Sit to/from Stand Sit to Stand: Min  guard;Min assist         General transfer comment: vc's for L UE placement with transfers (x1 from bed and x2 from recliner)  Ambulation/Gait Ambulation/Gait assistance: Min guard;Min assist Gait Distance (Feet):  (80 feet; 40 feet) Assistive device: Rolling walker (2 wheeled)   Gait velocity: decreased   General Gait Details: decreased R foot clearance (sliding R foot on floor when advancing especially with fatigue); increased R knee hyperextension (in R LE stance phase) with increased distance ambulating   Stairs             Wheelchair Mobility    Modified Rankin (Stroke Patients Only)       Balance Overall balance assessment: Needs assistance Sitting-balance support: No upper extremity supported;Feet supported Sitting balance-Leahy Scale: Good Sitting balance - Comments: steady sitting reaching within BOS   Standing balance support: During functional activity Standing balance-Leahy Scale: Fair Standing balance comment: pt requiring at least single UE support for dynamic standing activities                            Cognition Arousal/Alertness: Awake/alert Behavior During Therapy: WFL for tasks assessed/performed Overall Cognitive Status: Within Functional Limits for tasks assessed                                 General Comments: A&O x4; very motivated      Exercises General Exercises - Lower Extremity Long Arc Quad:  AAROM;Strengthening;Right;10 reps;Seated Hip Flexion/Marching: AROM;Strengthening;Right;10 reps;Seated    General Comments   Nursing cleared pt for participation in physical therapy.  Pt agreeable to PT session.      Pertinent Vitals/Pain Pain Assessment: 0-10 Pain Score: 3  Pain Location: R shoulder pain (has had since December moving boxes) Pain Descriptors / Indicators: Aching;Sore Pain Intervention(s): Limited activity within patient's tolerance;Monitored during session;Repositioned  Vitals (HR and O2 on  room air) stable and WFL throughout treatment session.    Home Living                      Prior Function            PT Goals (current goals can now be found in the care plan section) Acute Rehab PT Goals Patient Stated Goal: Get back to normal life PT Goal Formulation: With patient Time For Goal Achievement: 01/07/21 Potential to Achieve Goals: Good Progress towards PT goals: Progressing toward goals    Frequency    7X/week      PT Plan Current plan remains appropriate    Co-evaluation              AM-PAC PT "6 Clicks" Mobility   Outcome Measure  Help needed turning from your back to your side while in a flat bed without using bedrails?: A Little Help needed moving from lying on your back to sitting on the side of a flat bed without using bedrails?: A Little Help needed moving to and from a bed to a chair (including a wheelchair)?: A Little Help needed standing up from a chair using your arms (e.g., wheelchair or bedside chair)?: A Little Help needed to walk in hospital room?: A Little Help needed climbing 3-5 steps with a railing? : A Lot 6 Click Score: 17    End of Session Equipment Utilized During Treatment: Gait belt Activity Tolerance: Patient tolerated treatment well Patient left: in chair;with call bell/phone within reach;with chair alarm set Nurse Communication: Mobility status;Precautions PT Visit Diagnosis: Other abnormalities of gait and mobility (R26.89);Other symptoms and signs involving the nervous system (R29.898);Hemiplegia and hemiparesis Hemiplegia - Right/Left: Right Hemiplegia - dominant/non-dominant: Dominant Hemiplegia - caused by: Cerebral infarction     Time: 0912-0956 PT Time Calculation (min) (ACUTE ONLY): 44 min  Charges:  $Gait Training: 8-22 mins $Therapeutic Exercise: 8-22 mins $Therapeutic Activity: 8-22 mins                    Hendricks Limes, PT 12/26/20, 12:59 PM

## 2020-12-26 NOTE — Telephone Encounter (Signed)
Pt has been in the hospital and needs a HFU.  There is nothing until March.  Please schd pt a HFu and call and let her know when or call her when she gets home from the hospital.  The nurse from the hospital called asking for an appt.

## 2020-12-27 LAB — COMPREHENSIVE METABOLIC PANEL
ALT: 21 U/L (ref 0–44)
AST: 22 U/L (ref 15–41)
Albumin: 3.5 g/dL (ref 3.5–5.0)
Alkaline Phosphatase: 50 U/L (ref 38–126)
Anion gap: 7 (ref 5–15)
BUN: 14 mg/dL (ref 6–20)
CO2: 26 mmol/L (ref 22–32)
Calcium: 8.7 mg/dL — ABNORMAL LOW (ref 8.9–10.3)
Chloride: 106 mmol/L (ref 98–111)
Creatinine, Ser: 0.92 mg/dL (ref 0.44–1.00)
GFR, Estimated: >60 mL/min (ref >60)
Glucose, Bld: 103 mg/dL — ABNORMAL HIGH (ref 70–99)
Potassium: 3.8 mmol/L (ref 3.5–5.1)
Sodium: 139 mmol/L (ref 135–145)
Total Bilirubin: 0.9 mg/dL (ref 0.3–1.2)
Total Protein: 6.1 g/dL — ABNORMAL LOW (ref 6.5–8.1)

## 2020-12-27 LAB — CBC WITH DIFFERENTIAL/PLATELET
Abs Immature Granulocytes: 0.03 10*3/uL (ref 0.00–0.07)
Basophils Absolute: 0 10*3/uL (ref 0.0–0.1)
Basophils Relative: 0 %
Eosinophils Absolute: 0.3 10*3/uL (ref 0.0–0.5)
Eosinophils Relative: 4 %
HCT: 40.1 % (ref 36.0–46.0)
Hemoglobin: 13.7 g/dL (ref 12.0–15.0)
Immature Granulocytes: 0 %
Lymphocytes Relative: 37 %
Lymphs Abs: 2.9 10*3/uL (ref 0.7–4.0)
MCH: 31.1 pg (ref 26.0–34.0)
MCHC: 34.2 g/dL (ref 30.0–36.0)
MCV: 90.9 fL (ref 80.0–100.0)
Monocytes Absolute: 0.4 10*3/uL (ref 0.1–1.0)
Monocytes Relative: 5 %
Neutro Abs: 4.3 10*3/uL (ref 1.7–7.7)
Neutrophils Relative %: 54 %
Platelets: 264 10*3/uL (ref 150–400)
RBC: 4.41 MIL/uL (ref 3.87–5.11)
RDW: 12.9 % (ref 11.5–15.5)
WBC: 8 10*3/uL (ref 4.0–10.5)
nRBC: 0 % (ref 0.0–0.2)

## 2020-12-27 NOTE — Evaluation (Signed)
Physical Therapy Assessment and Plan  Patient Details  Name: [name] MRN: [id number] Date of Birth: May 12, 1961  PT Diagnosis: Abnormal posture, Abnormality of gait, Coordination disorder, Difficulty walking, Hemiparesis dominant and Muscle weakness Rehab Potential:   ELOS:     Today's Date: 12/27/2020 PT Individual Time: 1100-1200 PT Individual Time Calculation (min): 60 min    Hospital Problem: Principal Problem:   Left pontine stroke [facility])   Past Medical History:  Past Medical History:  Diagnosis Date  . Arthritis    hands, feet, knees  . Asthma   . Migraine   . Mood change    due to menopause/ hot flashes  . PONV (postoperative nausea and vomiting)    Past Surgical History:  Past Surgical History:  Procedure Laterality Date  . ABDOMINAL HYSTERECTOMY    . APPENDECTOMY    . CESAREAN SECTION     x 1  . COLONOSCOPY  2012   due in 2017 for suspicious lesion  . COLONOSCOPY WITH PROPOFOL N/A 08/20/2016   Procedure: COLONOSCOPY WITH PROPOFOL;  Surgeon: Lucilla Lame, MD;  Location: [facility];  Service: Endoscopy;  Laterality: N/A;  . PARTIAL HYSTERECTOMY     one ovary left  . REPAIR ANKLE LIGAMENT     from fall  . ROTATOR CUFF REPAIR Left    shoulder  . TONSILLECTOMY      Assessment & Plan Clinical Impression: Patient is a 59 y.o. year old female with history of migraines, vitamin D deficiency whowas originally seen at [facility] 12/19/20 with HA, right facial droop and right sided weakness. Work up revealed acute left pontine infarct and symptoms resolved prior to d/c home the next day. She was readmitted on 12/23/20 with slurred speech and worsening of right sided weakness. MRI brain done revealing acute/subacute infarct left paramedian pons increased insizie c/w 12/19/20 and small chronic left cerebellar infarct. CTA head/neck showed acute infarct left paracentral pons without hemorrhage. Patient not tPA candidate and no LVO so no intervention offered. Stroke  felt to be due small vessel disease and patient treated with IVF to keep SBP>140. To continue DAPT X 3 weeks followed by ASA alone and "CIR as main stay of treatment"   Patient currently requires min with mobility secondary to muscle weakness, decreased cardiorespiratoy endurance, impaired timing and sequencing, abnormal tone, unbalanced muscle activation and decreased coordination and decreased sitting balance, decreased standing balance, decreased postural control, hemiplegia and decreased balance strategies.  Prior to hospitalization, patient was independent  with mobility and lived with Spouse in a House home.  Home access is 3Stairs to enter.  Patient will benefit from skilled PT intervention to maximize safe functional mobility, minimize fall risk and decrease caregiver burden for planned discharge home with 24 hour supervision.  Anticipate patient will benefit from follow up OP at discharge.  PT - End of Session Activity Tolerance: Tolerates 30+ min activity with multiple rests Endurance Deficit: Yes PT Assessment Rehab Potential (ACUTE/IP ONLY): Good PT Patient demonstrates impairments in the following area(s): Balance;Endurance;Motor;Safety;Sensory;Skin Integrity PT Transfers Functional Problem(s): Bed Mobility;Bed to Chair;Car PT Locomotion Functional Problem(s): Ambulation;Stairs PT Plan PT Intensity: Minimum of 1-2 x/day ,45 to 90 minutes PT Frequency: 5 out of 7 days PT Duration Estimated Length of Stay: 1.5-2 weeks PT Treatment/Interventions: Ambulation/gait training;Discharge planning;Functional mobility training;Psychosocial support;Therapeutic Activities;Visual/perceptual remediation/compensation;Balance/vestibular training;Neuromuscular re-education;Disease management/prevention;Skin care/wound management;Therapeutic Exercise;Wheelchair propulsion/positioning;Cognitive remediation/compensation;DME/adaptive equipment instruction;Pain management;Splinting/orthotics;UE/LE Strength  taining/ROM;Community reintegration;Functional electrical stimulation;Patient/family education;Stair training;UE/LE Coordination activities PT Transfers Anticipated Outcome(s): Spv PT Locomotion Anticipated Outcome(s): Spv PT Recommendation  Follow Up Recommendations: Outpatient PT;24 hour supervision/assistance Patient destination: Home Equipment Recommended: Rolling walker with 5" wheels   PT Evaluation Precautions/Restrictions Restrictions Weight Bearing Restrictions: No Home Living/Prior Functioning Home Living Available Help at Discharge: Family;Available 24 hours/day Type of Home: House Home Access: Stairs to enter [employer] of Steps: 3 Entrance Stairs-Rails: Left;Right (can't reach both handrails) Home Layout: One level Bathroom Shower/Tub: Chiropodist: Standard Bathroom Accessibility: Yes  Lives With: Spouse Prior Function Level of Independence: Independent with basic ADLs;Independent with gait;Independent with homemaking with ambulation;Independent with transfers;Independent with homemaking with wheelchair  Able to Take Stairs?: Yes Driving: Yes Vocation: Full time employment Vision/Perception  Perception Perception: Within Functional Limits Praxis Praxis: Intact  Cognition Overall Cognitive Status: Within Functional Limits for tasks assessed Arousal/Alertness: Awake/alert Orientation Level: Oriented X4 Memory: Appears intact Awareness: Appears intact Problem Solving: Appears intact Safety/Judgment: Appears intact Sensation Sensation Light Touch: Appears Intact Hot/Cold: Not tested Proprioception: Appears Intact Stereognosis: Appears Intact Coordination Gross Motor Movements are Fluid and Coordinated: No Fine Motor Movements are Fluid and Coordinated: No Coordination and Movement Description: R hemiplegia UE >LE Heel Shin Test: limited ROM R LE Motor  Motor Motor: Hemiplegia;Abnormal tone Motor - Skilled Clinical  Observations: R hemiplegia  UE>LE   Trunk/Postural Assessment  Cervical Assessment Cervical Assessment: Within Functional Limits Thoracic Assessment Thoracic Assessment: Within Functional Limits Lumbar Assessment Lumbar Assessment: Exceptions to [facility] (posterior pelvic tilt) Postural Control Postural Control: Deficits on evaluation Righting Reactions: delayed and inadequate  Balance Balance Balance Assessed: Yes Standardized Balance Assessment Standardized Balance Assessment: Berg Balance Test Berg Balance Test Sit to Stand: Able to stand  independently using hands Standing Unsupported: Able to stand 2 minutes with supervision Sitting with Back Unsupported but Feet Supported on Floor or Stool: Able to sit safely and securely 2 minutes Stand to Sit: Sits safely with minimal use of hands Transfers: Able to transfer safely, minor use of hands Standing Unsupported with Eyes Closed: Able to stand 10 seconds with supervision Standing Ubsupported with Feet Together: Able to place feet together independently and stand for 1 minute with supervision From Standing, Reach Forward with Outstretched Arm: Can reach forward >12 cm safely (5") From Standing Position, Pick up Object from Floor: Able to pick up shoe, needs supervision From Standing Position, Turn to Look Behind Over each Shoulder: Looks behind from both sides and weight shifts well Turn 360 Degrees: Needs close supervision or verbal cueing Standing Unsupported, Alternately Place Feet on Step/Stool: Needs assistance to keep from falling or unable to try Standing Unsupported, One Foot in Front: Needs help to step but can hold 15 seconds Standing on One Leg: Able to lift leg independently and hold 5-10 seconds Total Score: 39 Static Sitting Balance Static Sitting - Level of Assistance: 5: Stand by assistance Dynamic Sitting Balance Dynamic Sitting - Level of Assistance: 5: Stand by assistance Static Standing Balance Static Standing -  Level of Assistance: 5: Stand by assistance Dynamic Standing Balance Dynamic Standing - Level of Assistance: 4: Min assist Extremity Assessment      RLE Assessment RLE Assessment: Exceptions to [facility] RLE Strength Right Hip Flexion: 2/5 Right Hip ABduction: 2+/5 Right Hip ADduction: 2+/5 Right Knee Flexion: 2/5 Right Knee Extension: 2+/5 Right Ankle Dorsiflexion: 1/5 Right Ankle Plantar Flexion: 2+/5 LLE Assessment LLE Assessment: Within Functional Limits  Care Tool Care Tool Bed Mobility Roll left and right activity   Roll left and right assist level: Supervision/Verbal cueing    Sit to lying activity  Sit to lying assist level: Supervision/Verbal cueing    Lying to sitting edge of bed activity   Lying to sitting edge of bed assist level: Supervision/Verbal cueing     Care Tool Transfers Sit to stand transfer   Sit to stand assist level: Supervision/Verbal cueing    Chair/bed transfer   Chair/bed transfer assist level: Contact Guard/Touching assist     Toilet transfer   Assist Level: Contact Guard/Touching assist    Car transfer Car transfer activity did not occur: Safety/medical concerns        Care Tool Locomotion Ambulation   Assist level: Contact Guard/Touching assist Assistive device: Walker-rolling Max distance: 100  Walk 10 feet activity   Assist level: Contact Guard/Touching assist Assistive device: Walker-rolling   Walk 50 feet with 2 turns activity   Assist level: Contact Guard/Touching assist Assistive device: Walker-rolling  Walk 150 feet activity Walk 150 feet activity did not occur: Safety/medical concerns      Walk 10 feet on uneven surfaces activity Walk 10 feet on uneven surfaces activity did not occur: Safety/medical concerns      Stairs   Assist level: Minimal Assistance - Patient > 75% Stairs assistive device: 1 hand rail Max number of stairs: 4  Walk up/down 1 step activity   Walk up/down 1 step (curb) assist level: Minimal  Assistance - Patient > 75% Walk up/down 1 step or curb assistive device: 1 hand rail    Walk up/down 4 steps activity Walk up/down 4 steps assist level: Minimal Assistance - Patient > 75%    Walk up/down 12 steps activity Walk up/down 12 steps activity did not occur: Safety/medical concerns      Pick up small objects from floor   Pick up small object from the floor assist level: Contact Guard/Touching assist    Wheelchair Will patient use wheelchair at discharge?: No          Wheel 50 feet with 2 turns activity      Wheel 150 feet activity        Refer to Care Plan for Long Term Goals  SHORT TERM GOAL WEEK 1 PT Short Term Goal 1 (Week 1): Patient will transfer bed <> wc with supervision PT Short Term Goal 2 (Week 1): Patient will ambulate at least 171f with LRAD and CGA PT Short Term Goal 3 (Week 1): Patient with negotiate at least 3 steps with LRAD and CGA  Recommendations for other services: None   Skilled Therapeutic Intervention Mobility Bed Mobility Bed Mobility: Rolling Right;Rolling Left;Supine to Sit;Sit to Supine Rolling Right: Independent Rolling Left: Independent Supine to Sit: Supervision/Verbal cueing Sit to Supine: Supervision/Verbal cueing Transfers Transfers: Sit to Stand;Stand to Sit;Stand Pivot Transfers Sit to Stand: Supervision/Verbal cueing Stand Pivot Transfers: Contact Guard/Touching assist Transfer (Assistive device): 1 person hand held assist Locomotion  Gait Ambulation: Yes Gait Assistance: Contact Guard/Touching assist Gait Distance (Feet): 100 Feet Assistive device: Rolling walker Gait Gait: Yes Gait Pattern: Impaired Gait Pattern: Step-through pattern;Right hip hike;Right foot flat;Right circumduction;Right genu recurvatum;Trendelenburg;Trunk rotated posteriorly on right;Poor foot clearance - right;Decreased dorsiflexion - right;Decreased hip/knee flexion - right Gait velocity: decreased Stairs / Additional Locomotion Ramp: Minimal  Assistance - Patient >75% Wheelchair Mobility Wheelchair Mobility: No  Patient received supine in bed, agreeable to PT eval. She denies pain. Patient demonstrating safe and appropriate compensatory strategies to come to sit edge of bed with supervision. Patient grossly CGA for all functional mobility with intermittent MinA needed for longer distances of gait and stair negotiation.  Efficiency and safety of patients gait pattern improved with application of ACE wrap into dorsiflexion to R LE. PT trialing NMES to R anterior tib with good motor response to improve active dorsiflexion. Patient returning to room, requesting to return to bed. Bed alarm on, call light within reach.   Discharge Criteria: Patient will be discharged from PT if patient refuses treatment 3 consecutive times without medical reason, if treatment goals not met, if there is a change in medical status, if patient makes no progress towards goals or if patient is discharged from hospital.  The above assessment, treatment plan, treatment alternatives and goals were discussed and mutually agreed upon: by patient  Debbora Dus 12/27/2020, 11:43 AM

## 2020-12-27 NOTE — Progress Notes (Signed)
Occupational Therapy Session Note  Patient Details  Name: [name] MRN: [id number] Date of Birth: 14-Apr-1961  Today's Date: 12/27/2020 OT Individual Time: 1303-1400 OT Individual Time Calculation (min): 57 min    Short Term Goals: Week 1:  OT Short Term Goal 1 (Week 1): Pt will donn a pullover shirt with supervision for two consecutive attempts. OT Short Term Goal 2 (Week 1): Pt will complete LB dressing with no more than min assist for two consecutive attempts. OT Short Term Goal 3 (Week 1): Pt will use the RUE as a gross assist during selfcare tasks with mod facilitation. OT Short Term Goal 4 (Week 1): Pt will complete walk-in shower transfer with min guard assist using the RW for support.  Skilled Therapeutic Interventions/Progress Updates:  Pt completed transfer from the bed to the wheelchair with min assist stand pivot.  Therapist then rolled her down to the therapy gym for session with mod assist transfer to the mat completed secondary to having her weightbear more through the RLE with sit to stand.  On the mat, session focused on RUE neuromuscular re-education with weightbearing tasks while having her reach with the LUE.to pick up and place clothespins on horizontal bars or the bedside table.  Min assist needed to transition to squat position with RUE in weightbearing while working on reaching tasks.  She progressed to working on leans to the right with use of the RUE to focus on pushing her back up to midline.  Mod assist needed to stabilization of the RUE and for activation of elbow extension through the movement.  Attempted to work on active movement with use of the tilted stool, however when she would try to push the stool forward, pain in her right shoulder would increase secondary to history of rotator cuff injury.  She would then get nauseated and needed to stop.  This occurred X 2 before it was decided to transfer back to the room.  After a couple of mins rest between each  occurrence, she felt better.  Nursing made aware of pt's request to have something for nausea.  Finished session with return to the room and transfer back to the bed a min assist level.  Call button and phone in reach with safety alarm in place.      Therapy Documentation Precautions:  Precautions Precautions: Fall Precaution Comments: R hemi Restrictions Weight Bearing Restrictions: No  Pain: Pain Assessment Pain Scale: Faces Faces Pain Scale: Hurts a little bit Pain Type: Acute pain Pain Location: Shoulder Pain Orientation: Right Pain Descriptors / Indicators: Discomfort Pain Onset: With Activity Pain Intervention(s): Repositioned    Therapy/Group: Individual Therapy  Dezirea Mccollister OTR/L 12/27/2020, 4:03 PM

## 2020-12-27 NOTE — Evaluation (Signed)
Occupational Therapy Assessment and Plan  Patient Details  Name: [name] MRN: [id number] Date of Birth: 06/12/1961  OT Diagnosis: hemiplegia affecting dominant side and muscle weakness (generalized) Rehab Potential: Rehab Potential (ACUTE ONLY): Excellent ELOS: 12-14 days   Today's Date: 12/27/2020 OT Individual Time: 9628-3662 OT Individual Time Calculation (min): 60 min     Hospital Problem: Principal Problem:   Left pontine stroke [facility])   Past Medical History:  Past Medical History:  Diagnosis Date  . Arthritis    hands, feet, knees  . Asthma   . Migraine   . Mood change    due to menopause/ hot flashes  . PONV (postoperative nausea and vomiting)    Past Surgical History:  Past Surgical History:  Procedure Laterality Date  . ABDOMINAL HYSTERECTOMY    . APPENDECTOMY    . CESAREAN SECTION     x 1  . COLONOSCOPY  2012   due in 2017 for suspicious lesion  . COLONOSCOPY WITH PROPOFOL N/A 08/20/2016   Procedure: COLONOSCOPY WITH PROPOFOL;  Surgeon: Lucilla Lame, MD;  Location: [place];  Service: Endoscopy;  Laterality: N/A;  . PARTIAL HYSTERECTOMY     one ovary left  . REPAIR ANKLE LIGAMENT     from fall  . ROTATOR CUFF REPAIR Left    shoulder  . TONSILLECTOMY      Assessment & Plan Clinical Impression: Patient is a 60 y.o. year old female with recent admission to the hospital on 12/23/20 with slurred speech and worsening of right sided weakness. MRI brain done revealing acute/subacute infarct left paramedian pons increased insizie c/w 12/19/20 and small chronic left cerebellar infarct. CTA head/neck showed acute infarct left paracentral pons without hemorrhage.   Patient transferred to CIR on 12/26/2020 .    Patient currently requires mod with basic self-care skills secondary to muscle weakness, muscle joint tightness and muscle paralysis, impaired timing and sequencing, unbalanced muscle activation and decreased coordination and decreased standing  balance, hemiplegia and decreased balance strategies.  Prior to hospitalization, patient could complete ADLs with independent .  Patient will benefit from skilled intervention to decrease level of assist with basic self-care skills, increase independence with basic self-care skills and increase level of independence with iADL prior to discharge home with care partner.  Anticipate patient will require 24 hour supervision and follow up outpatient.  OT - End of Session Activity Tolerance: Tolerates 30+ min activity with multiple rests Endurance Deficit: Yes OT Assessment Rehab Potential (ACUTE ONLY): Excellent OT Patient demonstrates impairments in the following area(s): Balance;Endurance;Motor;Pain OT Basic ADL's Functional Problem(s): Eating;Grooming;Bathing;Dressing;Toileting OT Advanced ADL's Functional Problem(s): Simple Meal Preparation OT Transfers Functional Problem(s): Toilet;Tub/Shower OT Additional Impairment(s): Fuctional Use of Upper Extremity OT Plan OT Intensity: Minimum of 1-2 x/day, 45 to 90 minutes OT Frequency: 5 out of 7 days OT Duration/Estimated Length of Stay: 12-14 days OT Treatment/Interventions: Balance/vestibular training;Community reintegration;DME/adaptive equipment instruction;Disease mangement/prevention;Discharge planning;Functional electrical stimulation;Functional mobility training;Neuromuscular re-education;Patient/family education;Self Care/advanced ADL retraining;Therapeutic Activities;UE/LE Coordination activities;Therapeutic Exercise;UE/LE Strength taining/ROM;Splinting/orthotics;Wheelchair propulsion/positioning OT Self Feeding Anticipated Outcome(s): modified independent OT Basic Self-Care Anticipated Outcome(s): supervision OT Toileting Anticipated Outcome(s): supervision OT Bathroom Transfers Anticipated Outcome(s): supervision OT Recommendation Patient destination: Home Follow Up Recommendations: Outpatient OT Equipment Recommended: To be  determined   OT Evaluation Precautions/Restrictions  Precautions Precautions: Fall Precaution Comments: R hemi Restrictions Weight Bearing Restrictions: No  Pain Pain Assessment Pain Scale: Faces Pain Score: 0-No pain Faces Pain Scale: Hurts a little bit Pain Type: Acute pain Pain Location: Shoulder Pain Orientation: Right  Pain Descriptors / Indicators: Discomfort Pain Onset: With Activity Pain Intervention(s): Repositioned;Emotional support;Elevated extremity Home Living/Prior Functioning Home Living Available Help at Discharge: Family,Available 24 hours/day Type of Home: House Home Access: Stairs to enter [employer] of Steps: 3 Entrance Stairs-Rails: Left,Right Home Layout: One level Bathroom Shower/Tub: Multimedia programmer: Programmer, systems: Yes  Lives With: Spouse IADL History Homemaking Responsibilities: Yes Meal Prep Responsibility: Primary Laundry Responsibility: Secondary Cleaning Responsibility: Secondary Current License: Yes Occupation: Full time employment 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 Prior Function Level of Independence: Independent with basic ADLs,Independent with gait,Independent with homemaking with ambulation,Independent with transfers  Able to [place]?: Yes Driving: Yes Vocation: Full time employment Vision Baseline Vision/History: Wears glasses Wears Glasses: At all times Patient Visual Report: No change from baseline Vision Assessment?: No apparent visual deficits Perception  Perception: Within Functional Limits Praxis Praxis: Intact Cognition Overall Cognitive Status: Within Functional Limits for tasks assessed Arousal/Alertness: Awake/alert Orientation Level: Person;Place;Situation Person: Oriented Place: Oriented Situation: Oriented Year: 2022 Month: January Day of Week: Correct Memory: Appears intact Immediate Memory Recall: Sock;Blue;Bed Memory Recall  Sock: Without Cue Memory Recall Blue: Without Cue Memory Recall Bed: Without Cue Attention: Sustained Sustained Attention: Appears intact Awareness: Appears intact Problem Solving: Appears intact Safety/Judgment: Appears intact Sensation Sensation Light Touch: Appears Intact Hot/Cold: Appears Intact Proprioception: Appears Intact Stereognosis: Not tested Coordination Gross Motor Movements are Fluid and Coordinated: No Fine Motor Movements are Fluid and Coordinated: No Coordination and Movement Description: Pt with Brunnstrum stage II movement in the right arm and hand. Heel Shin Test: limited ROM R LE Motor  Motor Motor: Hemiplegia Motor - Skilled Clinical Observations: R hemiplegia  UE>LE  Trunk/Postural Assessment  Cervical Assessment Cervical Assessment: Within Functional Limits Thoracic Assessment Thoracic Assessment: Within Functional Limits Lumbar Assessment Lumbar Assessment: Exceptions to [facility] (posterior pelvic tilt) Postural Control Postural Control: Deficits on evaluation Righting Reactions: delayed and inadequate  Balance Balance Balance Assessed: Yes Standardized Balance Assessment Standardized Balance Assessment: Berg Balance Test Berg Balance Test Sit to Stand: Able to stand  independently using hands Standing Unsupported: Able to stand 2 minutes with supervision Sitting with Back Unsupported but Feet Supported on Floor or Stool: Able to sit safely and securely 2 minutes Stand to Sit: Sits safely with minimal use of hands Transfers: Able to transfer safely, minor use of hands Standing Unsupported with Eyes Closed: Able to stand 10 seconds with supervision Standing Ubsupported with Feet Together: Able to place feet together independently and stand for 1 minute with supervision From Standing, Reach Forward with Outstretched Arm: Can reach forward >12 cm safely (5") From Standing Position, Pick up Object from Floor: Able to pick up shoe, needs  supervision From Standing Position, Turn to Look Behind Over each Shoulder: Looks behind from both sides and weight shifts well Turn 360 Degrees: Needs close supervision or verbal cueing Standing Unsupported, Alternately Place Feet on Step/Stool: Needs assistance to keep from falling or unable to try Standing Unsupported, One Foot in Front: Needs help to step but can hold 15 seconds Standing on One Leg: Able to lift leg independently and hold 5-10 seconds Total Score: 39 Static Sitting Balance Static Sitting - Balance Support: Feet supported Static Sitting - Level of Assistance: 7: Independent Dynamic Sitting Balance Dynamic Sitting - Balance Support: Feet unsupported;During functional activity Dynamic Sitting - Level of Assistance: 5: Stand by assistance Static Standing Balance Static Standing - Balance Support: During functional activity;Right upper extremity supported Static Standing -  Level of Assistance: 4: Min assist Dynamic Standing Balance Dynamic Standing - Balance Support: During functional activity;No upper extremity supported Dynamic Standing - Level of Assistance: 3: Mod assist Extremity/Trunk Assessment RUE Assessment RUE Assessment: Exceptions to [facility] Passive Range of Motion (PROM) Comments: PROM WFLs for all joints Active Range of Motion (AROM) Comments: Pt currently demonstrates Brunnstrum stage II movement in the arm with only trace shoulder movements noted with beginning synergy pattern.  In the hand she exhibits trace digit flexion with greater digit extension at less than 10%, exhibiting Brunnstrum stage III movement. General Strength Comments: She needs max hand over hand for integration into selfcare tasks as a stabilizer.  Pt also with recent history of rotator cuff straing prior to this CVA. LUE Assessment LUE Assessment: Within Functional Limits  Care Tool Care Tool Self Care Eating   Eating Assist Level: Set up assist    Oral Care    Oral Care Assist Level:  Minimal Assistance - Patient > 75% (standing at the sink)    Bathing   Body parts bathed by patient: Right arm;Chest;Abdomen;Front perineal area;Buttocks;Right upper leg;Left upper leg;Right lower leg;Left lower leg;Face Body parts bathed by helper: Left arm   Assist Level: Moderate Assistance - Patient 50 - 74%    Upper Body Dressing(including orthotics)   What is the patient wearing?: Pull over shirt;Bra   Assist Level: Moderate Assistance - Patient 50 - 74%    Lower Body Dressing (excluding footwear)   What is the patient wearing?: Underwear/pull up;Pants Assist for lower body dressing: Moderate Assistance - Patient 50 - 74%    Putting on/Taking off footwear   What is the patient wearing?: Non-skid slipper socks Assist for footwear: Minimal Assistance - Patient > 75%       Care Tool Toileting Toileting activity   Assist for toileting: Moderate Assistance - Patient 50 - 74%     Care Tool Bed Mobility Roll left and right activity   Roll left and right assist level: Supervision/Verbal cueing    Sit to lying activity   Sit to lying assist level: Supervision/Verbal cueing    Lying to sitting edge of bed activity   Lying to sitting edge of bed assist level: Supervision/Verbal cueing     Care Tool Transfers Sit to stand transfer   Sit to stand assist level: Moderate Assistance - Patient 50 - 74%    Chair/bed transfer   Chair/bed transfer assist level: Moderate Assistance - Patient 50 - 74%     Toilet transfer   Assist Level: Moderate Assistance - Patient 50 - 74%     Care Tool Cognition Expression of Ideas and Wants Expression of Ideas and Wants: Without difficulty (complex and basic) - expresses complex messages without difficulty and with speech that is clear and easy to understand   Understanding Verbal and Non-Verbal Content Understanding Verbal and Non-Verbal Content: Understands (complex and basic) - clear comprehension without cues or repetitions    Memory/Recall Ability *first 3 days only      Refer to Care Plan for Long Term Goals  SHORT TERM GOAL WEEK 1 OT Short Term Goal 1 (Week 1): Pt will donn a pullover shirt with supervision for two consecutive attempts. OT Short Term Goal 2 (Week 1): Pt will complete LB dressing with no more than min assist for two consecutive attempts. OT Short Term Goal 3 (Week 1): Pt will use the RUE as a gross assist during selfcare tasks with mod facilitation. OT Short Term Goal 4 (Week  1): Pt will complete walk-in shower transfer with min guard assist using the RW for support.  Recommendations for other services: None    Skilled Therapeutic Intervention ADL ADL Eating: Set up Where Assessed-Eating: Wheelchair Grooming: Minimal assistance Where Assessed-Grooming: Standing at sink Upper Body Bathing: Minimal assistance Where Assessed-Upper Body Bathing: Edge of bed Lower Body Bathing: Moderate assistance Where Assessed-Lower Body Bathing: Edge of bed Upper Body Dressing: Moderate assistance Where Assessed-Upper Body Dressing: Edge of bed Lower Body Dressing: Moderate assistance Where Assessed-Lower Body Dressing: Edge of bed Toileting: Minimal assistance Where Assessed-Toileting: Glass blower/designer: Moderate assistance Toilet Transfer Method: Counselling psychologist: Raised toilet seat;Grab bars Tub/Shower Transfer: Not assessed Social research officer, government: Not assessed Mobility  Bed Mobility Bed Mobility: Supine to Sit Rolling Right: Independent Rolling Left: Independent Supine to Sit: Supervision/Verbal cueing Sit to Supine: Supervision/Verbal cueing Transfers Sit to Stand: Moderate Assistance - Patient 50-74% Stand to Sit: Minimal Assistance - Patient > 75%   Session Note:  Pt worked on bathing and dressing from EOB this session secondary to sharing room with another pt who was taking OT as well.  She needed max hand over hand assist to integrate the RUE into bathing  tasks for washing the left arm or for holding the soap when opening it.  She needed mod assist for initial sit to stand with posterior LOB with ambulation to the bathroom.  Mod assist was also needed to complete mobility without use of an assistive device.  Mod demonstrational cueing was needed for hemi dressing techniques to donn a pullover shirt.  She stood at the sink with min assist for washing her face and hands as well as for brushing her teeth.  Finished session with transfer to the wheelchair where the RUE was positioned on lap tray for support.  Pt with recent rotator cuff strain on the right side, so she reported that this made it feel better.  Call button and phone in reach with safety belt in place.     Discharge Criteria: Patient will be discharged from OT if patient refuses treatment 3 consecutive times without medical reason, if treatment goals not met, if there is a change in medical status, if patient makes no progress towards goals or if patient is discharged from hospital.  The above assessment, treatment plan, treatment alternatives and goals were discussed and mutually agreed upon: by patient  Henri Baumler OTR/L 12/27/2020, 12:57 PM

## 2020-12-27 NOTE — Progress Notes (Signed)
[facility] PHYSICAL MEDICINE & REHABILITATION PROGRESS NOTE   Subjective/Complaints:    Objective:   No results found. Recent Labs    12/27/20 0549  WBC 8.0  HGB 13.7  HCT 40.1  PLT 264   Recent Labs    12/27/20 0549  NA 139  K 3.8  CL 106  CO2 26  GLUCOSE 103*  BUN 14  CREATININE 0.92  CALCIUM 8.7*    Intake/Output Summary (Last 24 hours) at 12/27/2020 1313 Last data filed at 12/26/2020 1700 Gross per 24 hour  Intake 120 ml  Output --  Net 120 ml        Physical Exam: Vital Signs Blood pressure (!) 114/58, pulse 64, temperature 98.8 F (37.1 C), resp. rate 16, height 5\' 4"  (1.626 m), weight 80.5 kg, SpO2 94 %.    General: No acute distress Mood and affect are appropriate Heart: Regular rate and rhythm no rubs murmurs or extra sounds Lungs: Clear to auscultation, breathing unlabored, no rales or wheezes Abdomen: Positive bowel sounds, soft nontender to palpation, nondistended Extremities: No clubbing, cyanosis, or edema Skin: No evidence of breakdown, no evidence of rash Neurologic: Cranial nerves II through XII intact, motor strength is 5/5 in left deltoid, bicep, tricep, grip, hip flexor, knee extensors, ankle dorsiflexor and plantar flexor 0/5 right upper extremity except trace finger flexors and extensors  3 - at the right knee extensor to minus at the plantar flexors 0 at the ankle dorsiflexor and hip flexor.  Musculoskeletal: Full range of motion in all 4 extremities. No joint swelling  Assessment/Plan: 1. Functional deficits which require 3+ hours per day of interdisciplinary therapy in a comprehensive inpatient rehab setting.  Physiatrist is providing close team supervision and 24 hour management of active medical problems listed below.  Physiatrist and rehab team continue to assess barriers to discharge/monitor patient progress toward functional and medical goals  Care Tool:  Bathing    Body parts bathed by patient: Right  arm,Chest,Abdomen,Front perineal area,Buttocks,Right upper leg,Left upper leg,Right lower leg,Left lower leg,Face   Body parts bathed by helper: Left arm     Bathing assist Assist Level: Moderate Assistance - Patient 50 - 74%     Upper Body Dressing/Undressing Upper body dressing   What is the patient wearing?: Pull over shirt,Bra    Upper body assist Assist Level: Moderate Assistance - Patient 50 - 74%    Lower Body Dressing/Undressing Lower body dressing      What is the patient wearing?: Underwear/pull up,Pants     Lower body assist Assist for lower body dressing: Moderate Assistance - Patient 50 - 74%     Toileting Toileting    Toileting assist Assist for toileting: Moderate Assistance - Patient 50 - 74%     Transfers Chair/bed transfer  Transfers assist     Chair/bed transfer assist level: Moderate Assistance - Patient 50 - 74%     Locomotion Ambulation   Ambulation assist      Assist level: Moderate Assistance - Patient 50 - 74% Assistive device: No Device Max distance: 10'   Walk 10 feet activity   Assist     Assist level: Contact Guard/Touching assist Assistive device: Walker-rolling   Walk 50 feet activity   Assist    Assist level: Contact Guard/Touching assist Assistive device: Walker-rolling    Walk 150 feet activity   Assist Walk 150 feet activity did not occur: Safety/medical concerns         Walk 10 feet on uneven surface  activity   Assist Walk 10 feet on uneven surfaces activity did not occur: Safety/medical concerns         Wheelchair     Assist Will patient use wheelchair at discharge?: No             Wheelchair 50 feet with 2 turns activity    Assist            Wheelchair 150 feet activity     Assist          Blood pressure (!) 114/58, pulse 64, temperature 98.8 F (37.1 C), resp. rate 16, height 5\' 4"  (1.626 m), weight 80.5 kg, SpO2 94 %.  Medical Problem List and  Plan: 1.Functional deficits and right hemiparesissecondary to left pontine infarct -patient may shower -ELOS/Goals: 7-10 days, mod I to supervision with OT, PT -order right WHO, PRAFO for night-time wear 2. Antithrombotics: -DVT/anticoagulation:Pharmaceutical:Lovenox -antiplatelet therapy: DAPT 3. Pain Management/ pre-morbid right RTC injury:tylenol for mild pain, tramadol for more severe pain -add sports cream bid -support right upper ext in bed/chair -ROM exercises with PT/OT 4. Mood:LCSW to follow for evaluation and support. -antipsychotic agents: N/a 5. Neuropsych: This patientiscapable of making decisions on herown behalf. 6. Skin/Wound Care:Routine pressure relief measures. 7. Fluids/Electrolytes/Nutrition:Monitor I/O. Check lytes in am.  8. Blood pressures: Higher BP goal recommended and toavoid hypotension.  Vitals:   12/26/20 2039 12/27/20 0558  BP: 125/73 (!) 114/58  Pulse: 62 64  Resp: 18 16  Temp: 98.3 F (36.8 C) 98.8 F (37.1 C)  SpO2: 95% 94%  Patient without dizziness with standing, no fluctuating weakness. 9. H/o Migraines: Continue Topamax. 10. 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 11. H/o depression: Stable on current dose Celexa.    LOS: 1 days A FACE TO FACE EVALUATION WAS PERFORMED  12/29/20 12/27/2020, 1:13 PM

## 2020-12-28 NOTE — Progress Notes (Signed)
[place] PHYSICAL MEDICINE & REHABILITATION PROGRESS NOTE   Subjective/Complaints:  RIght shoulder a little sore but has improved movement RUE and RLE today   Feels constipated   ROS- neg CP, SOB, N/V/D  Objective:   No results found. Recent Labs    12/27/20 0549  WBC 8.0  HGB 13.7  HCT 40.1  PLT 264   Recent Labs    12/27/20 0549  NA 139  K 3.8  CL 106  CO2 26  GLUCOSE 103*  BUN 14  CREATININE 0.92  CALCIUM 8.7*    Intake/Output Summary (Last 24 hours) at 12/28/2020 1037 Last data filed at 12/27/2020 2011 Gross per 24 hour  Intake 150 ml  Output --  Net 150 ml        Physical Exam: Vital Signs Blood pressure 138/69, pulse 68, temperature 97.8 F (36.6 C), temperature source Oral, resp. rate 16, height 5\' 4"  (1.626 m), weight 80.5 kg, SpO2 96 %.  General: No acute distress Mood and affect are appropriate Heart: Regular rate and rhythm no rubs murmurs or extra sounds Lungs: Clear to auscultation, breathing unlabored, no rales or wheezes Abdomen: Positive bowel sounds, soft nontender to palpation, nondistended Extremities: No clubbing, cyanosis, or edema Skin: No evidence of breakdown, no evidence of rash   Neurologic:motor strength is 5/5 in left deltoid, bicep, tricep, grip, hip flexor, knee extensors, ankle dorsiflexor and plantar flexor 3-/5 right upper extremity delt, biceps, 2- tri , finger flexors and extensors  3 - at the right knee extensor to minus at the plantar flexors 0 at the ankle dorsiflexor and hip flexor.2- Right toe flexors and extensors   Musculoskeletal: Full range of motion in all 4 extremities. No joint swelling  Assessment/Plan: 1. Functional deficits which require 3+ hours per day of interdisciplinary therapy in a comprehensive inpatient rehab setting.  Physiatrist is providing close team supervision and 24 hour management of active medical problems listed below.  Physiatrist and rehab team continue to assess barriers to  discharge/monitor patient progress toward functional and medical goals  Care Tool:  Bathing    Body parts bathed by patient: Right arm,Chest,Abdomen,Front perineal area,Buttocks,Right upper leg,Left upper leg,Right lower leg,Left lower leg,Face   Body parts bathed by helper: Left arm     Bathing assist Assist Level: Moderate Assistance - Patient 50 - 74%     Upper Body Dressing/Undressing Upper body dressing   What is the patient wearing?: Pull over shirt,Bra    Upper body assist Assist Level: Moderate Assistance - Patient 50 - 74%    Lower Body Dressing/Undressing Lower body dressing      What is the patient wearing?: Underwear/pull up,Pants     Lower body assist Assist for lower body dressing: Moderate Assistance - Patient 50 - 74%     Toileting Toileting    Toileting assist Assist for toileting: Supervision/Verbal cueing     Transfers Chair/bed transfer  Transfers assist     Chair/bed transfer assist level: Moderate Assistance - Patient 50 - 74%     Locomotion Ambulation   Ambulation assist      Assist level: Moderate Assistance - Patient 50 - 74% Assistive device: No Device Max distance: 10'   Walk 10 feet activity   Assist     Assist level: Contact Guard/Touching assist Assistive device: Walker-rolling   Walk 50 feet activity   Assist    Assist level: Contact Guard/Touching assist Assistive device: Walker-rolling    Walk 150 feet activity   Assist Walk 150  feet activity did not occur: Safety/medical concerns         Walk 10 feet on uneven surface  activity   Assist Walk 10 feet on uneven surfaces activity did not occur: Safety/medical concerns         Wheelchair     Assist Will patient use wheelchair at discharge?: No             Wheelchair 50 feet with 2 turns activity    Assist            Wheelchair 150 feet activity     Assist          Blood pressure 138/69, pulse 68, temperature  97.8 F (36.6 C), temperature source Oral, resp. rate 16, height 5\' 4"  (1.626 m), weight 80.5 kg, SpO2 96 %.  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  2. Antithrombotics: -DVT/anticoagulation:Pharmaceutical:Lovenox -antiplatelet therapy: DAPT 3. Pain Management/ pre-morbid right RTC injury:tylenol for mild pain, tramadol for more severe pain -add sports cream bid -support right upper ext in bed/chair -ROM exercises with PT/OT 4. Mood:LCSW to follow for evaluation and support. -antipsychotic agents: N/a 5. Neuropsych: This patientiscapable of making decisions on herown behalf. 6. Skin/Wound Care:Routine pressure relief measures. 7. Fluids/Electrolytes/Nutrition:Monitor I/O. Check lytes in am.  8. Blood pressures: Higher BP goal recommended and toavoid hypotension.  Vitals:   12/27/20 1926 12/28/20 0527  BP: (!) 141/63 138/69  Pulse: 62 68  Resp: 16 16  Temp: 98.6 F (37 C) 97.8 F (36.6 C)  SpO2: 99% 96%  controlled , in range today  9. H/o Migraines: Continue Topamax. 10. 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 11. H/o depression: Stable on current dose Celexa.    LOS: 2 days A FACE TO FACE EVALUATION WAS PERFORMED  12/30/20 12/28/2020, 10:37 AM

## 2020-12-29 DIAGNOSIS — S46011S Strain of muscle(s) and tendon(s) of the rotator cuff of right shoulder, sequela: Secondary | ICD-10-CM

## 2020-12-29 DIAGNOSIS — S46011A Strain of muscle(s) and tendon(s) of the rotator cuff of right shoulder, initial encounter: Secondary | ICD-10-CM

## 2020-12-29 DIAGNOSIS — G43909 Migraine, unspecified, not intractable, without status migrainosus: Secondary | ICD-10-CM

## 2020-12-29 DIAGNOSIS — J45909 Unspecified asthma, uncomplicated: Secondary | ICD-10-CM

## 2020-12-29 DIAGNOSIS — G8191 Hemiplegia, unspecified affecting right dominant side: Secondary | ICD-10-CM

## 2020-12-29 LAB — CBC
HCT: 40.3 % (ref 36.0–46.0)
Hemoglobin: 13.6 g/dL (ref 12.0–15.0)
MCH: 30.6 pg (ref 26.0–34.0)
MCHC: 33.7 g/dL (ref 30.0–36.0)
MCV: 90.8 fL (ref 80.0–100.0)
Platelets: 294 10*3/uL (ref 150–400)
RBC: 4.44 MIL/uL (ref 3.87–5.11)
RDW: 13 % (ref 11.5–15.5)
WBC: 7 10*3/uL (ref 4.0–10.5)
nRBC: 0 % (ref 0.0–0.2)

## 2020-12-29 LAB — BASIC METABOLIC PANEL
Anion gap: 7 (ref 5–15)
BUN: 19 mg/dL (ref 6–20)
CO2: 28 mmol/L (ref 22–32)
Calcium: 8.7 mg/dL — ABNORMAL LOW (ref 8.9–10.3)
Chloride: 106 mmol/L (ref 98–111)
Creatinine, Ser: 0.92 mg/dL (ref 0.44–1.00)
GFR, Estimated: >60 mL/min (ref >60)
Glucose, Bld: 105 mg/dL — ABNORMAL HIGH (ref 70–99)
Potassium: 3.5 mmol/L (ref 3.5–5.1)
Sodium: 141 mmol/L (ref 135–145)

## 2020-12-29 NOTE — Progress Notes (Signed)
+/-   sleep. Complaining of right shoulder pain. PRN tylenol given at 2021. PRN ultram given at 0441 for continued right shoulder pain, rating pain 7 on scale. Ice pack applied. Declined scheduled singular and sports cream at bedtime. [name] A

## 2020-12-29 NOTE — Progress Notes (Signed)
PHYSICAL MEDICINE & REHABILITATION PROGRESS NOTE   Subjective/Complaints: Patient seen sitting up working with therapy this morning.  She states she slept well overnight.  She states had a good weekend.  She notes right shoulder pain, which was present prior to admission.  ROS: +Right shoulder pain. Denies CP, SOB, N/V/D  Objective:   No results found. Recent Labs    12/27/20 0549 12/29/20 0617  WBC 8.0 7.0  HGB 13.7 13.6  HCT 40.1 40.3  PLT 264 294   Recent Labs    12/27/20 0549 12/29/20 0617  NA 139 141  K 3.8 3.5  CL 106 106  CO2 26 28  GLUCOSE 103* 105*  BUN 14 19  CREATININE 0.92 0.92  CALCIUM 8.7* 8.7*    Intake/Output Summary (Last 24 hours) at 12/29/2020 1040 Last data filed at 12/29/2020 0900 Gross per 24 hour  Intake 716 ml  Output --  Net 716 ml        Physical Exam: Vital Signs Blood pressure 124/61, pulse 64, temperature 98.3 F (36.8 C), temperature source Oral, resp. rate 18, height 5\' 4"  (1.626 m), weight 80.5 kg, SpO2 94 %. Constitutional: No distress . Vital signs reviewed. HENT: Normocephalic.  Atraumatic. Eyes: EOMI. No discharge. Cardiovascular: No JVD.  RRR. Respiratory: Normal effort.  No stridor.  Bilateral clear to auscultation. GI: Non-distended.  BS +. Skin: Warm and dry.  Intact. Psych: Normal mood.  Normal behavior. Musc: No edema in extremities.   Right anterior and posterior shoulder tender to palpation. Neurologic: Alert Motor: LUE/LE: 5/5 proximal distal RUE: Shoulder abduction, elbow flexion/extension 2/5, wrist extension, handgrip 1/5 Right lower extremity: Hip flexion 4+/5, knee extension 3/5, ankle dorsiflexion 1/5   Assessment/Plan: 1. Functional deficits which require 3+ hours per day of interdisciplinary therapy in a comprehensive inpatient rehab setting.  Physiatrist is providing close team supervision and 24 hour management of active medical problems listed below.  Physiatrist and rehab team  continue to assess barriers to discharge/monitor patient progress toward functional and medical goals  Care Tool:  Bathing    Body parts bathed by patient: Right arm,Chest,Abdomen,Front perineal area,Buttocks,Right upper leg,Left upper leg,Right lower leg,Left lower leg,Face   Body parts bathed by helper: Left arm     Bathing assist Assist Level: Moderate Assistance - Patient 50 - 74%     Upper Body Dressing/Undressing Upper body dressing   What is the patient wearing?: Pull over shirt,Bra    Upper body assist Assist Level: Moderate Assistance - Patient 50 - 74%    Lower Body Dressing/Undressing Lower body dressing      What is the patient wearing?: Underwear/pull up,Pants     Lower body assist Assist for lower body dressing: Moderate Assistance - Patient 50 - 74%     Toileting Toileting Toileting Activity did not occur (Clothing management and hygiene only): N/A (no void or bm)  Toileting assist Assist for toileting: Supervision/Verbal cueing     Transfers Chair/bed transfer  Transfers assist     Chair/bed transfer assist level: Moderate Assistance - Patient 50 - 74%     Locomotion Ambulation   Ambulation assist      Assist level: Moderate Assistance - Patient 50 - 74% Assistive device: No Device Max distance: 10'   Walk 10 feet activity   Assist     Assist level: Contact Guard/Touching assist Assistive device: Walker-rolling   Walk 50 feet activity   Assist    Assist level: Contact Guard/Touching assist Assistive device: Walker-rolling  Walk 150 feet activity   Assist Walk 150 feet activity did not occur: Safety/medical concerns         Walk 10 feet on uneven surface  activity   Assist Walk 10 feet on uneven surfaces activity did not occur: Safety/medical concerns         Wheelchair     Assist Will patient use wheelchair at discharge?: No             Wheelchair 50 feet with 2 turns  activity    Assist            Wheelchair 150 feet activity     Assist          Medical Problem List and Plan: 1.Functional deficits and 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 Antithrombotics: -DVT/anticoagulation:Pharmaceutical:Lovenox -antiplatelet therapy: DAPT 3. Pain Management/ pre-morbid right RTC injury:tylenol for mild pain, tramadol for more severe pain -add sports cream bid -support right upper ext in bed/chair -ROM exercises with PT/OT  Relatively controlled on 1/31 4. Mood:LCSW to follow for evaluation and support. -antipsychotic agents: N/a 5. Neuropsych: This patientiscapable of making decisions on herown behalf. 6. Skin/Wound Care:Routine pressure relief measures.  BMP within acceptable range on 1/31 7. Fluids/Electrolytes/Nutrition:Monitor I/Os.  8. Blood pressures: Higher BP goal recommended and toavoid hypotension.  Vitals:   12/28/20 2006 12/29/20 0523  BP: 129/63 124/61  Pulse: 73 64  Resp: 16 18  Temp: 99.5 F (37.5 C) 98.3 F (36.8 C)  SpO2: 96% 94%   Controlled on 1/31 9. H/o Migraines: Continue Topamax.  Controlled on 1/31 10. 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  Monitor with increased exertion 11. H/o depression: Stable on current dose Celexa.    LOS: 3 days A FACE TO FACE EVALUATION WAS PERFORMED  Renold Kozar Karis Juba 12/29/2020, 10:40 AM

## 2020-12-29 NOTE — Progress Notes (Signed)
Occupational Therapy Session Note  Patient Details  Name: [name] MRN: [id number] Date of Birth: 08/14/1961  Today's Date: 12/29/2020 OT Individual Time: 1530-1630 OT Individual Time Calculation (min): 60 min    Short Term Goals: Week 1:  OT Short Term Goal 1 (Week 1): Pt will donn a pullover shirt with supervision for two consecutive attempts. OT Short Term Goal 2 (Week 1): Pt will complete LB dressing with no more than min assist for two consecutive attempts. OT Short Term Goal 3 (Week 1): Pt will use the RUE as a gross assist during selfcare tasks with mod facilitation. OT Short Term Goal 4 (Week 1): Pt will complete walk-in shower transfer with min guard assist using the RW for support.  Skilled Therapeutic Interventions/Progress Updates:    Pt semi upright in bed, no c/o pain, requesting to shower.  Pt ambulated to bathroom using RW with hand splint attached to walker on right with CGA.  Pt completed shower bench transfer with supervision using grab bars.  Pt doffed shirt with min assist and bra with mod assist (due to tight fit) overhead.  Pt doffed pants and underwear with supervision.  Pt bathed UB with min assist including Hand over hand of right to wash under left arm.  Also required hand over hand to hold shampoo bottle with right and unscrew with left.  Pt bathed LB with supervision.  Donned bra with mod assist and shirt with min assist.  Donned underwear with min assist after education on hemitechnique. Pt donned grip socks with supervision.  Ambulated back to EOB using RW with supervision. Assessed right resting hand orthosis due to reports of experiencing numbness and tingling with wear at night.  Repositioned orthosis to facilitate improved functional positioning of hand and pt reports increased comfort.  Instructed pt through [facility] of right shoulder FF and ER/IR in supine as well as elbow flex/ext and gross grasp/digital extension with cues to increase visual feedback  throughout and TCs to recruit desired musculature.  Pts husband arrived and pt donned pants that he provided with min assist.  Call bell in reach, bed alarm on at end of session.  Therapy Documentation Precautions:  Precautions Precautions: Fall Precaution Comments: R hemi Restrictions Weight Bearing Restrictions: No   Therapy/Group: Individual Therapy  Amie Critchley 12/29/2020, 4:32 PM

## 2020-12-29 NOTE — Progress Notes (Signed)
Inpatient Rehabilitation Care Coordinator Assessment and Plan Patient Details  Name: [name] MRN: [id number] Date of Birth: 09-09-1961  Today's Date: 12/29/2020  Hospital Problems: Principal Problem:   Left pontine stroke [facility]) Active Problems:   Strain of right rotator cuff capsule   Migraine without status migrainosus, not intractable   Uncomplicated asthma   Right hemiparesis ([place])  Past Medical History:  Past Medical History:  Diagnosis Date  . Arthritis    hands, feet, knees  . Asthma   . Migraine   . Mood change    due to menopause/ hot flashes  . PONV (postoperative nausea and vomiting)    Past Surgical History:  Past Surgical History:  Procedure Laterality Date  . ABDOMINAL HYSTERECTOMY    . APPENDECTOMY    . CESAREAN SECTION     x 1  . COLONOSCOPY  2012   due in 2017 for suspicious lesion  . COLONOSCOPY WITH PROPOFOL N/A 08/20/2016   Procedure: COLONOSCOPY WITH PROPOFOL;  Surgeon: Lucilla Lame, MD;  Location: [place];  Service: Endoscopy;  Laterality: N/A;  . PARTIAL HYSTERECTOMY     one ovary left  . REPAIR ANKLE LIGAMENT     from fall  . ROTATOR CUFF REPAIR Left    shoulder  . TONSILLECTOMY     Social History:  reports that she has never smoked. She has never used smokeless tobacco. She reports current alcohol use. She reports that she does not use drugs.  Family / Support Systems Marital Status: Married Patient Roles: Spouse,Parent Spouse/Significant Other: Legrand Como Children: Saralyn Pilar Anticipated Caregiver: Spouse (retired) Ability/Limitations of Caregiver: none Caregiver Availability: 24/7  Social History Preferred language: English Religion: Methodist Read: Yes Write: Yes Employment Status: Employed (Working Engineer, technical sales) Public relations account executive Issues: none Guardian/Conservator: [place] Sizer   Abuse/Neglect Abuse/Neglect Assessment Can Be Completed: Yes Physical Abuse: Denies Verbal Abuse: Denies Sexual Abuse:  Denies Exploitation of patient/patient's resources: Denies Self-Neglect: Denies  Emotional Status    Patient / Family Perceptions, Expectations & Goals Pt/Family understanding of illness & functional limitations: yes Premorbid pt/family roles/activities: Previosuly independent, working remote, driving Anticipated changes in roles/activities/participation: Some assistance Pt/family expectations/goals: MOD I to [place]: None Premorbid Home Care/DME Agencies: None Transportation available at discharge: family able to transport  Discharge Planning Living Arrangements: Spouse/significant other Support Systems: Spouse/significant other,Children Type of Residence: Private residence Insurance Resources: Multimedia programmer (specify) (Patient uninsured, reports insurance begins 12/30/2020) Financial Resources: 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 SW informed pt of barriers due to being uninsured. Patient currently confirming insurance plan that will go into effect beginning on 2/1. Patient goal to discharge home with spouse (retired) to provide 24/7 care/supervision. No additional questions or concerns, sw will continue to follow up.   Dyanne Iha 12/29/2020, 12:58 PM

## 2020-12-29 NOTE — Progress Notes (Signed)
Physical Therapy Session Note  Patient Details  Name: [name] MRN: [id number] Date of Birth: 1961/09/30  Today's Date: 12/29/2020 PT Individual Time: 8416-6063 PT Individual Time Calculation (min): 25 min   Short Term Goals: Week 1:  PT Short Term Goal 1 (Week 1): Patient will transfer bed <> wc with supervision PT Short Term Goal 2 (Week 1): Patient will ambulate at least 145ft with LRAD and CGA PT Short Term Goal 3 (Week 1): Patient with negotiate at least 3 steps with LRAD and CGA  Skilled Therapeutic Interventions/Progress Updates:     Patient in bed with her husband at bedside when received from OT upon PT arrival. Patient alert and agreeable to PT session. Patient denied pain during session.  Therapeutic Activity: Bed Mobility: Patient performed supine to/from sit with mod I with HOB slightly elevated. Patient donned shoes EOB with set-up assist on the L and mod-max A on the R.  Transfers: Patient performed sit to/from stand x4 with CGA-supervision using RW and R hand splint. Provided verbal cues for reaching back to sit for safety each trial.  Gait Training:  Patient ambulated >80 feet x2 using RW with R hand splint with CGA for safety/balance. Ambulated with decreased R knee flexion and DF in swing, decreased knee control in R stance with fatigue, and increased compensatory strategies with R hip to advance R foot. Provided verbal cues for increased R hamstring activation in pre-swing, heel strike at initial contact, and lateral hip stability in R stance.  Neuromuscular Re-ed: Patient performed the following activities: -seated R toe taps progressing from PROM to [facility] for dorsiflexors 2x15 -R scapular retraction x10 with 2-3 sec hold -R shoulder rolls forward/backwards focused on R symmetry to L for increased motor activation x10 each direction -R elbow flexion/extension through 1/2 range with AROM x5 -R hamstring activation with knee flexion x10   During session,  patient able to teach-back benefits of high intensity locomotor training and course of stroke recovery taught in previous session.   Patient in bed with her husband at bedside at end of session with breaks locked, bed alarm set, and all needs within reach.    Therapy Documentation Precautions:  Precautions Precautions: Fall Precaution Comments: R hemi Restrictions Weight Bearing Restrictions: No   Therapy/Group: Individual Therapy  Kathren Scearce L Armondo Cech PT, DPT  12/29/2020, 5:47 PM

## 2020-12-29 NOTE — Progress Notes (Signed)
Physical Therapy Session Note  Patient Details  Name: [name] MRN: [id number] Date of Birth: 05-18-1961  Today's Date: 12/29/2020 PT Individual Time: 1400-1445 PT Individual Time Calculation (min): 45 min   Short Term Goals: Week 1:  PT Short Term Goal 1 (Week 1): Patient will transfer bed <> wc with supervision PT Short Term Goal 2 (Week 1): Patient will ambulate at least 161ft with LRAD and CGA PT Short Term Goal 3 (Week 1): Patient with negotiate at least 3 steps with LRAD and CGA Week 2:    Week 3:     Skilled Therapeutic Interventions/Progress Updates:    PAIN c/o mild L shoulder discomfort during TM training.  Rest and repositioning as needed.   Pt initially supine and requesting assistance w/R soft splint (hand).  Splint adjusted for comfort/improved positioning/pt voiced satisfaction.  Pt supine to sit w/supervision.  DF assist acewrap applied to [place] by therapist.  Sit to stand w/cga to RW.  Gait 179ft w/min assist, significant R trendelenberg, advances RLE w/some flexor pattern interference. Pt educated to benefits of LiteGait/TM training.   Harness donned and doffed in sitting/standing by therapist, pt cga in standing w/RW.   Pt steps up/down onto tm w/cues for sequencing, use of UE support on Litegait frame, min assist. Gait trial on TM at .speed .4: 68ft x 1  170ft x 1 Therapist assisted w/clearance/heel strike RLE, cues to increase step length/achieve heel strike on L, decreased step length bilat, does not achieve full knee extenstion at term swing bilat, ?mild ataxia LLE during gait, R trendelenberg, tendency for ant trunk.  R hand secured w/acewrap.  Gait 119ft to room w/RW, DF assist acewrap, improved clearance RLE following TM training. Pt requested use of BR.  Commode transfer w/cga.  Pt left on commode w/NT notified and call bell in reach.    Therapy Documentation Precautions:  Precautions Precautions: Fall Precaution Comments: R  hemi Restrictions Weight Bearing Restrictions: No   Therapy/Group: Individual Therapy  Rada Hay, PT   Shearon Balo 12/29/2020, 4:25 PM

## 2020-12-29 NOTE — Progress Notes (Signed)
Inpatient Rehabilitation Center Individual Statement of Services  Patient Name:  [name]  Date:  12/29/2020  Welcome to the Inpatient Rehabilitation Center.  Our goal is to provide you with an individualized program based on your diagnosis and situation, designed to meet your specific needs.  With this comprehensive rehabilitation program, you will be expected to participate in at least 3 hours of rehabilitation therapies Monday-Friday, with modified therapy programming on the weekends.  Your rehabilitation program will include the following services:  Physical Therapy (PT), Occupational Therapy (OT), Speech Therapy (ST), 24 hour per day rehabilitation nursing, Psychology, Neuropsychology, Care Coordinator, Rehabilitation Medicine, Nutrition Services, Pharmacy Services and Other  Weekly team conferences will be held on Tuesday to discuss your progress.  Your Inpatient Rehabilitation Care Coordinator will talk with you frequently to get your input and to update you on team discussions.  Team conferences with you and your family in attendance may also be held.  Expected length of stay: 7-10 Days  Overall anticipated outcome: MOD I to Supervision  Depending on your progress and recovery, your program may change. Your Inpatient Rehabilitation Care Coordinator will coordinate services and will keep you informed of any changes. Your Inpatient Rehabilitation Care Coordinator's name and contact numbers are listed  below.  The following services may also be recommended but are not provided by the Inpatient Rehabilitation Center:    Home Health Rehabiltiation Services  Outpatient Rehabilitation Services    Arrangements will be made to provide these services after discharge if needed.  Arrangements include referral to agencies that provide these services.  Your insurance has been verified to be:  UNINSURED Your primary doctor is:  Bari Edward, MD  Pertinent information will be shared  with your doctor and your insurance company.  Inpatient Rehabilitation Care Coordinator:  Dossie Der, [place] [phone] or Luna [place]  Information discussed with and copy given to patient by: Andria Rhein, 12/29/2020, 11:10 AM

## 2020-12-29 NOTE — Progress Notes (Signed)
Physical Therapy Session Note  Patient Details  Name: [name] MRN: [id number] Date of Birth: 08/13/1961  Today's Date: 12/29/2020 PT Individual Time: 0800-0900 PT Individual Time Calculation (min): 60 min   Short Term Goals: Week 1:  PT Short Term Goal 1 (Week 1): Patient will transfer bed <> wc with supervision PT Short Term Goal 2 (Week 1): Patient will ambulate at least 19ft with LRAD and CGA PT Short Term Goal 3 (Week 1): Patient with negotiate at least 3 steps with LRAD and CGA  Skilled Therapeutic Interventions/Progress Updates:    Pt received supine in bed, awake and motivated to participate in therapy session. Reports unrated R shldr pain, reports she received pain medication from nursing. Pt reports this pain occurred prior to her stroke after lifting boxes into her attic (over head reaching). Noted what appears to be muscle belly tightness around supraspinatus insertion on the R proximal shoulder. Painful abduction at ~80deg and painful external rotation at ~40deg. Provided gentle AAROM trigger point release.   Supine<>sit with supervision with bed features. Able to sit EOB with SBA. Stand<>pivot transfer with CGA from EOB to w/c. W/c transport to main therapy gym for time management and stand<>pivot with CGA from w/c to mat table.   Gait training 2x156ft + 145ft with CGA and RW. Ace warpped R foot into DF assist for 2/3 gait trials which improved foot clearance and knee stability. Noted gait deficits of hip hiking on R with limited R knee flexion during swing. Cues for corrections which she was able to perform.   Repeated sit<>stands 1x10 with mirror for visual feedback to improve postural awreness and reduce compensatory strategies. Cues for knee alignment and symmetrical movements. Performed repeated isometric reversals for knee flex/ext in short sitting at edge of mat. Finished session with repeated toe taps + forward weight shift on leading foot with RW support and CGA,  cues for sequencing and technique.  Stand<>pivot with CGA back to bed from w/c and she remained supine in bed at end of sessoin with bed alarm on and needs within reach. Pt requesting to apply RUE WHO and RLE prafoboot. Readjusted prafo with kickstand outwards to reduce external rotation as pt was c/o this during the night. Pt also c/o discomfort with prolonged wearing for RUE WHO, concerns relayed to primary OT for f/u. Pillow provided under R shldr for proximity and comfort.  Therapy Documentation Precautions:  Precautions Precautions: Fall Precaution Comments: R hemi Restrictions Weight Bearing Restrictions: No General:    Therapy/Group: Individual Therapy  Ronetta Molla P Tyshay Adee PT 12/29/2020, 7:50 AM

## 2020-12-29 NOTE — Progress Notes (Signed)
Inpatient Rehabilitation  Patient information reviewed and entered into eRehab system by Marcille Barman M. Vinny Taranto, M.A., CCC/SLP, PPS Coordinator.  Information including medical coding, functional ability and quality indicators will be reviewed and updated through discharge.    

## 2020-12-30 DIAGNOSIS — S46011D Strain of muscle(s) and tendon(s) of the rotator cuff of right shoulder, subsequent encounter: Secondary | ICD-10-CM

## 2020-12-30 MED ORDER — EXERCISE FOR HEART AND HEALTH BOOK
Freq: Once | Status: AC
  Filled 2020-12-30: qty 1

## 2020-12-30 MED ORDER — DICLOFENAC SODIUM 1 % EX GEL
2.0000 g | Freq: Four times a day (QID) | CUTANEOUS | Status: DC
  Administered 2020-12-30 – 2021-01-05 (×21): 2 g via TOPICAL
  Filled 2020-12-30 (×2): qty 100

## 2020-12-30 NOTE — Progress Notes (Signed)
Physical Therapy Session Note  Patient Details  Name: [name] MRN: [id number] Date of Birth: 09/08/1961  Today's Date: 12/30/2020 PT Individual Time: 0800-0915 PT Individual Time Calculation (min): 75 min   Short Term Goals: Week 1:  PT Short Term Goal 1 (Week 1): Patient will transfer bed <> wc with supervision PT Short Term Goal 2 (Week 1): Patient will ambulate at least 185ft with LRAD and CGA PT Short Term Goal 3 (Week 1): Patient with negotiate at least 3 steps with LRAD and CGA  Skilled Therapeutic Interventions/Progress Updates:    Pt greeted awake in bed, agreeable to therapy. Reports similar R shoulder pain as compared to yesterday, no worse and no better. Denies request for pain medication. Rest breaks provided for management. Supine<>sit with supervision with bed features and can sit EOB with SBA. She was able to remove socks with supervision via figure-4 technique. She required minA for donning R shoe but able to don L shoe with setupA.  Sit<>stand with supervision to RW. Ambulated from her room to main therapy gym with CGA and RW (RW gutter splint for RUE). Noted frequent R toe drag with the shoe on but no formal LOB noted. Cues for increased R knee flexion in swing to reduce hip hiking strategies.  Ace wrapped her R foot for DF assist and knee control. Gait training 248ft with CGA and RW with improved foot clearance but rather demonstration R knee hyperextension in terminal stance with difficulty correcting.  RLE NMR with eccentric step downs from 1inch block with minA for steadying and RW support, 2x10 reps (seated rest). Also performed repeated LAQ (AAROM for end range) during seated "rest" break.   Performed RUE NMR with lateral seated weight shifts with R UE externally rotated, facilitating weight bearing. Also performed modified dips with wedges to boost from, focusing on continued weight bearing through RUE (pt unable to clear buttock but was achieving weight bearing).    Dynamic standing balance with repeated sit<>stands with task overlay for hanging horseshoes over lowered basketball rim, requiring AAROM for end range and hand-over-hand assist for grasping the horseshoe.  Ended session with gait training and no AD, performing lateral side stepping L<>R (~44ft) with CGA. Emphasis on clearing steps, hip abd/add, dynamic balance, and righting reactions.  Ambulated back to her room with with CGA and RW with ace wrapped ankle, x1 LOB while turning requiring minA for correction. Pt requesting to brush her teeth when she returned to her room, she was able to complete this with setupA, performing oral care with LUE. She ended session seated EOB with needs in reach, called NT to assist with bathroom as we ran out of time. Pt pleased with progress made.  Therapy Documentation Precautions:  Precautions Precautions: Fall Precaution Comments: R hemi Restrictions Weight Bearing Restrictions: No  Therapy/Group: Individual Therapy  Candie Echevaria Michele Judy PT, DPT 12/30/2020, 7:52 AM

## 2020-12-30 NOTE — Progress Notes (Signed)
Occupational Therapy Session Note  Patient Details  Name: [name] MRN: [id number] Date of Birth: [birth year]  Today's Date: 12/30/2020 OT Individual Time: 1115-1200 OT Individual Time Calculation (min): 45 min    Short Term Goals: Week 1:  OT Short Term Goal 1 (Week 1): Pt will donn a pullover shirt with supervision for two consecutive attempts. OT Short Term Goal 2 (Week 1): Pt will complete LB dressing with no more than min assist for two consecutive attempts. OT Short Term Goal 3 (Week 1): Pt will use the RUE as a gross assist during selfcare tasks with mod facilitation. OT Short Term Goal 4 (Week 1): Pt will complete walk-in shower transfer with min guard assist using the RW for support.  Skilled Therapeutic Interventions/Progress Updates:    Pt seen this session to focus on RUE NMR.  Pt declined b/d but did need to toilet.  Close S with sit to stands, stand pivots to and from bed to w/c,  toilet >< wc. Distant S to mod I with toileting.  Pt washed hands at sink in standing.  Taken to gym to sit on mat to work on A/arom using theraputty ball for roll outs, table top slides,grasping and rolling a hula hoop, hand over hand with cone grasp and release.  She has more AROM of shoulder but only trace grasp. Cues to not compensate with R shoulder elevation.   Pt participated well but did get arm fatigue at end of session.  Pt returned to room and opted to rest in bed vs chair. Alarm set and all needs met.  Therapy Documentation Precautions:  Precautions Precautions: Fall Precaution Comments: R hemi Restrictions Weight Bearing Restrictions: No    Pain: Pain Assessment Pain Score: 0-No pain ADL: ADL Eating: Set up Where Assessed-Eating: Wheelchair Grooming: Minimal assistance Where Assessed-Grooming: Standing at sink Upper Body Bathing: Minimal assistance Where Assessed-Upper Body Bathing: Edge of bed Lower Body Bathing: Moderate assistance Where Assessed-Lower Body  Bathing: Edge of bed Upper Body Dressing: Moderate assistance Where Assessed-Upper Body Dressing: Edge of bed Lower Body Dressing: Moderate assistance Where Assessed-Lower Body Dressing: Edge of bed Toileting: Minimal assistance Where Assessed-Toileting: Glass blower/designer: Moderate assistance Toilet Transfer Method: Counselling psychologist: Raised toilet seat,Grab bars Tub/Shower Transfer: Not assessed Social research officer, government: Not assessed  Therapy/Group: Individual Therapy  [place] 12/30/2020, 12:29 PM

## 2020-12-30 NOTE — Progress Notes (Signed)
Occupational Therapy Session Note  Patient Details  Name: [name] MRN: [id number] Date of Birth: 1961-02-25  Today's Date: 12/30/2020 OT Individual Time: 1300-1408 OT Individual Time Calculation (min): 68 min    Short Term Goals: Week 1:  OT Short Term Goal 1 (Week 1): Pt will donn a pullover shirt with supervision for two consecutive attempts. OT Short Term Goal 2 (Week 1): Pt will complete LB dressing with no more than min assist for two consecutive attempts. OT Short Term Goal 3 (Week 1): Pt will use the RUE as a gross assist during selfcare tasks with mod facilitation. OT Short Term Goal 4 (Week 1): Pt will complete walk-in shower transfer with min guard assist using the RW for support.  Skilled Therapeutic Interventions/Progress Updates:    Pt semi upright in bed, agreeable to OT session, no pain at rest however does complain of moderate pain left posterior shoulder with AROM.  Pt ambulated to large gym using RW and completed stand to sit EOM with supervision.  NMES to right biceps and triceps completed with neuro re-ed component including TCs and mirror for visual feedback to improve motor learning.  Pt able to achieve full AROM against gravity triceps press after neuro re-ed today, pt reports feeling encouraged by this gain.    Pt ambulated back to room using RW with supervision and requesting to wash up EOB.  Pt completed UB dressing, shirt overhead only, with supervision needing min VCs to initiate hemitechnique for improved independence.  Pt bathed UB with min assist and hand over hand provided to right to wash under left arm.  Pt requesting to use bathroom and ambulated using RW, completing toilet transfer with supervision.  Pt bathed peri area at sit/stand level with supervision.  Pt donned underwear and pants with min assist for RLE threading.  Pt ambulated back to EOB and completed sit to supine with supervision.  Call bell in reach, bed alarm on.    Therapy  Documentation Precautions:  Precautions Precautions: Fall Precaution Comments: R hemi Restrictions Weight Bearing Restrictions: No   Therapy/Group: Individual Therapy  Amie Critchley 12/30/2020, 3:06 PM

## 2020-12-30 NOTE — Progress Notes (Signed)
[facility] PHYSICAL MEDICINE & REHABILITATION PROGRESS NOTE   Subjective/Complaints: Patient seen sitting up in bed this morning eating breakfast.  She states she slept well overnight.  She has questions regarding medical bracelet as well as her right shoulder.  She notes improvement in strength daily.  ROS: +Right shoulder pain.  Denies CP, SOB, N/V/D  Objective:   No results found. Recent Labs    12/29/20 0617  WBC 7.0  HGB 13.6  HCT 40.3  PLT 294   Recent Labs    12/29/20 0617  NA 141  K 3.5  CL 106  CO2 28  GLUCOSE 105*  BUN 19  CREATININE 0.92  CALCIUM 8.7*    Intake/Output Summary (Last 24 hours) at 12/30/2020 0845 Last data filed at 12/29/2020 1853 Gross per 24 hour  Intake 560 ml  Output --  Net 560 ml        Physical Exam: Vital Signs Blood pressure 132/72, pulse 71, temperature 98.2 F (36.8 C), temperature source Oral, resp. rate 16, height 5\' 4"  (1.626 m), weight 80.5 kg, SpO2 97 %. Constitutional: No distress . Vital signs reviewed. HENT: Normocephalic.  Atraumatic. Eyes: EOMI. No discharge. Cardiovascular: No JVD.  RRR. Respiratory: Normal effort.  No stridor.  Bilateral clear to auscultation. GI: Non-distended.  BS +. Skin: Warm and dry.  Intact. Psych: Normal mood.  Normal behavior. Musc: Right anterior and posterior shoulder tender to palpation, stable. No edema Neurologic: Alert Motor: LUE/LE: 5/5 proximal distal RUE: Shoulder abduction, elbow flexion/extension 2 +/5, wrist extension, handgrip 3+/5 Right lower extremity: Hip flexion 4+/5, knee extension 3/5, ankle dorsiflexion 3/5   Assessment/Plan: 1. Functional deficits which require 3+ hours per day of interdisciplinary therapy in a comprehensive inpatient rehab setting.  Physiatrist is providing close team supervision and 24 hour management of active medical problems listed below.  Physiatrist and rehab team continue to assess barriers to discharge/monitor patient progress toward  functional and medical goals  Care Tool:  Bathing    Body parts bathed by patient: Right arm,Chest,Abdomen,Front perineal area,Buttocks,Right upper leg,Left upper leg,Right lower leg,Left lower leg,Face   Body parts bathed by helper: Left arm     Bathing assist Assist Level: Moderate Assistance - Patient 50 - 74%     Upper Body Dressing/Undressing Upper body dressing   What is the patient wearing?: Pull over shirt,Bra    Upper body assist Assist Level: Moderate Assistance - Patient 50 - 74%    Lower Body Dressing/Undressing Lower body dressing      What is the patient wearing?: Underwear/pull up,Pants     Lower body assist Assist for lower body dressing: Moderate Assistance - Patient 50 - 74%     Toileting Toileting Toileting Activity did not occur and hygiene only): N/A (no void or bm)  Toileting assist Assist for toileting: Supervision/Verbal cueing     Transfers Chair/bed transfer  Transfers assist     Chair/bed transfer assist level: Contact Guard/Touching assist     Locomotion Ambulation   Ambulation assist      Assist level: Minimal Assistance - Patient > 75% Assistive device: Walker-rolling Max distance: 140   Walk 10 feet activity   Assist     Assist level: Minimal Assistance - Patient > 75% Assistive device: Walker-rolling   Walk 50 feet activity   Assist    Assist level: Minimal Assistance - Patient > 75% Assistive device: Walker-rolling    Walk 150 feet activity   Assist Walk 150 feet activity did not  occur: Safety/medical concerns         Walk 10 feet on uneven surface  activity   Assist Walk 10 feet on uneven surfaces activity did not occur: Safety/medical concerns         Wheelchair     Assist Will patient use wheelchair at discharge?: No             Wheelchair 50 feet with 2 turns activity    Assist            Wheelchair 150 feet activity     Assist           Medical Problem List and Plan: 1.Functional deficits and 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 Antithrombotics: -DVT/anticoagulation:Pharmaceutical:Lovenox -antiplatelet therapy: DAPT 3. Pain Management/ pre-morbid right RTC injury:tylenol for mild pain, tramadol for more severe pain -add sports cream bid DC'd -support right upper ext in bed/chair -ROM exercises with PT/OT  Voltaren gel ordered on 2/1 4. Mood:LCSW to follow for evaluation and support. -antipsychotic agents: N/a 5. Neuropsych: This patientiscapable of making decisions on herown behalf. 6. Skin/Wound Care:Routine pressure relief measures.  BMP within acceptable range on 1/31 7. Fluids/Electrolytes/Nutrition:Monitor I/Os.  8. Blood pressures: Higher BP goal recommended and toavoid hypotension.  Vitals:   12/29/20 1944 12/30/20 0501  BP: 130/65 132/72  Pulse: 72 71  Resp: 17 16  Temp: 98 F (36.7 C) 98.2 F (36.8 C)  SpO2: 95% 97%   Controlled on 2/1 9. H/o Migraines: Topamax.  Controlled on 2/1 10. 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  Controlled on 2/1  Monitor with increased exertion 11. H/o depression: Stable on current dose Celexa.   LOS: 4 days A FACE TO FACE EVALUATION WAS PERFORMED  [place] Montminy Karis Juba 12/30/2020, 8:45 AM

## 2020-12-31 DIAGNOSIS — K5901 Slow transit constipation: Secondary | ICD-10-CM

## 2020-12-31 MED ORDER — POLYETHYLENE GLYCOL 3350 17 G PO PACK
17.0000 g | PACK | Freq: Every day | ORAL | Status: DC
  Administered 2021-01-01 – 2021-01-04 (×4): 17 g via ORAL
  Filled 2020-12-31 (×4): qty 1

## 2020-12-31 NOTE — Progress Notes (Signed)
Patient ID: [name], female   DOB: 12/26/1960, 60 y.o.   MRN: [id number] Team Conference Report to Patient/Family  Team Conference discussion was reviewed with the patient and caregiver, including goals, any changes in plan of care and target discharge date.  Patient and caregiver express understanding and are in agreement.  The patient has a target discharge date of 01/08/21.  Andria Rhein 12/31/2020, 2:08 PM

## 2020-12-31 NOTE — Progress Notes (Signed)
Pt declines her hand splint and PRAFO boot tonight

## 2020-12-31 NOTE — Progress Notes (Signed)
Patient ID: [name], female   DOB: 1961/09/14, 60 y.o.   MRN: [id number] Met with the patient and her significant other to review role of the nurse CM and collaboration with the SW to address educational needs and facilitate preparation for discharge. Discussed secondary risks including HLD with DLD of 86 goal of 70, DAPT for 3 weeks then ASA solo per MD. Patient noted she had been taking Topamax for migraines PTA and Neurology addressing another medication to treat headaches. Reported doing better and open to recommendations for Vit B12 and Vitamin D deficiency supplementation/dietary modifications. Patient given handouts on both with DASH diet and cooking with less salt. Reviewed team conference Wednesday and how to obtain recommended DME that is not covered by insurance as discussed with SW previously. Continue to follow along to discharge to address educational needs. Margarito Liner

## 2020-12-31 NOTE — Progress Notes (Addendum)
[facility] PHYSICAL MEDICINE & REHABILITATION PROGRESS NOTE   Subjective/Complaints: Patient seen sitting up in bed this AM.  She states she slept fairly overnight due to being sore.  She notes improvement in strength.  She has questions regarding VTE prophylaxis.  She notes improvement in right shoulder pain.  She notes constipation.   ROS: Denies CP, SOB, N/V/D  Objective:   No results found. Recent Labs    12/29/20 0617  WBC 7.0  HGB 13.6  HCT 40.3  PLT 294   Recent Labs    12/29/20 0617  NA 141  K 3.5  CL 106  CO2 28  GLUCOSE 105*  BUN 19  CREATININE 0.92  CALCIUM 8.7*    Intake/Output Summary (Last 24 hours) at 12/31/2020 1042 Last data filed at 12/31/2020 0900 Gross per 24 hour  Intake 240 ml  Output 1 ml  Net 239 ml        Physical Exam: Vital Signs Blood pressure (!) 141/68, pulse 71, temperature 97.7 F (36.5 C), resp. rate 18, height 5\' 4"  (1.626 m), weight 80.5 kg, SpO2 99 %. Constitutional: No distress . Vital signs reviewed. HENT: Normocephalic.  Atraumatic. Eyes: EOMI. No discharge. Cardiovascular: No JVD.  RRR. Respiratory: Normal effort.  No stridor.  Bilateral clear to auscultation. GI: Non-distended.  BS +. Skin: Warm and dry.  Intact. Psych: Normal mood.  Normal behavior. Musc: Right anterior and posterior shoulder tender to palpation, improving. No edema Neurologic: Alert Motor: LUE/LE: 5/5 proximal distal RUE: Shoulder abduction, elbow flexion/extension 3+/5, wrist extension, handgrip 3+/5 Right lower extremity: Hip flexion 4+/5, knee extension 4/5, ankle dorsiflexion 4-/5   Assessment/Plan: 1. Functional deficits which require 3+ hours per day of interdisciplinary therapy in a comprehensive inpatient rehab setting.  Physiatrist is providing close team supervision and 24 hour management of active medical problems listed below.  Physiatrist and rehab team continue to assess barriers to discharge/monitor patient progress toward  functional and medical goals  Care Tool:  Bathing    Body parts bathed by patient: Right arm,Chest,Abdomen,Front perineal area,Buttocks,Right upper leg,Left upper leg,Right lower leg,Left lower leg,Face   Body parts bathed by helper: Left arm     Bathing assist Assist Level: Moderate Assistance - Patient 50 - 74%     Upper Body Dressing/Undressing Upper body dressing   What is the patient wearing?: Pull over shirt,Bra    Upper body assist Assist Level: Moderate Assistance - Patient 50 - 74%    Lower Body Dressing/Undressing Lower body dressing      What is the patient wearing?: Underwear/pull up,Pants     Lower body assist Assist for lower body dressing: Moderate Assistance - Patient 50 - 74%     Toileting Toileting Toileting Activity did not occur and hygiene only): N/A (no void or bm)  Toileting assist Assist for toileting: Supervision/Verbal cueing     Transfers Chair/bed transfer  Transfers assist     Chair/bed transfer assist level: Supervision/Verbal cueing     Locomotion Ambulation   Ambulation assist      Assist level: Minimal Assistance - Patient > 75% Assistive device: Walker-rolling Max distance: 140   Walk 10 feet activity   Assist     Assist level: Minimal Assistance - Patient > 75% Assistive device: Walker-rolling   Walk 50 feet activity   Assist    Assist level: Minimal Assistance - Patient > 75% Assistive device: Walker-rolling    Walk 150 feet activity   Assist Walk 150 feet activity did  not occur: Safety/medical concerns         Walk 10 feet on uneven surface  activity   Assist Walk 10 feet on uneven surfaces activity did not occur: Safety/medical concerns         Wheelchair     Assist Will patient use wheelchair at discharge?: No             Wheelchair 50 feet with 2 turns activity    Assist            Wheelchair 150 feet activity     Assist           Medical Problem List and Plan: 1.Functional deficits and 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 Please see conference note from today as well.  2. Antithrombotics: -DVT/anticoagulation:Pharmaceutical:Lovenox -antiplatelet therapy: DAPT 3. Pain Management/ pre-morbid right RTC injury:tylenol for mild pain, tramadol for more severe pain -add sports cream bid DC'd -support right upper ext in bed/chair -ROM exercises with PT/OT  Voltaren gel ordered on 2/1, improving 4. Mood:LCSW to follow for evaluation and support. -antipsychotic agents: N/a 5. Neuropsych: This patientiscapable of making decisions on herown behalf. 6. Skin/Wound Care:Routine pressure relief measures.  BMP within acceptable range on 1/31 7. Fluids/Electrolytes/Nutrition:Monitor I/Os.  8. Blood pressures: Higher BP goal recommended and toavoid hypotension.  Vitals:   12/30/20 2008 12/31/20 0540  BP: 121/62 (!) 141/68  Pulse: 63 71  Resp: 18 18  Temp: 97.7 F (36.5 C) 97.7 F (36.5 C)  SpO2: 96% 99%   Relatively controlled on 2/2 9. H/o Migraines: Topamax.  Controlled on 2/2 10. 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  Controlled on 2/2  Monitor with increased exertion 11. H/o depression: Stable on current dose Celexa. 12. Slow transit constipation  Bowel meds increased on 2/2   LOS: 5 days A FACE TO FACE EVALUATION WAS PERFORMED  Asberry Lascola Karis Juba 12/31/2020, 10:42 AM

## 2020-12-31 NOTE — Progress Notes (Signed)
Occupational Therapy Session Note  Patient Details  Name: [name] MRN: [id number] Date of Birth: Nov 14, 1961  Today's Date: 12/31/2020 OT Individual Time: 1400-1445 OT Individual Time Calculation (min): 45 min    Short Term Goals: Week 1:  OT Short Term Goal 1 (Week 1): Pt will donn a pullover shirt with supervision for two consecutive attempts. OT Short Term Goal 2 (Week 1): Pt will complete LB dressing with no more than min assist for two consecutive attempts. OT Short Term Goal 3 (Week 1): Pt will use the RUE as a gross assist during selfcare tasks with mod facilitation. OT Short Term Goal 4 (Week 1): Pt will complete walk-in shower transfer with min guard assist using the RW for support.  Skilled Therapeutic Interventions/Progress Updates:    Pt semi upright in bed, reports feeling discouraged by discharge date.  Discussed and collaborated with pt and husband regarding POC and pt reports understanding and agreeable to dc plan.  Pt requesting to shower during session.  Pt retrieved ADL items at ambulation level using RW with walker splint use with supervision.  Pt ambulated to bathroom and completed toilet transfer and toileting with supervision.  Pt completed tub bench transfer at walk in shower level with supervision as well.  Pt doffed shirt and bra with mod I and pants and underwear with supervision.  Min assist needed to bathe UB with hand over hand provided to right to wash LUE for neuro re-ed.  Pt bathed LB with supervision.  Pt reports owning a shower chair at home and walk in shower has small threshold to step over.  Plan to train on step over transfer in future session.  Pt also reports she plans to have grab bars installed in shower at home.  Pt donned shirt and bra with min assist primarily due to tight fit of clothing.  Re-educated pt on benefits of wearing loose stretchy clothing to increase ease and independence.  Pt donned underwear and pants with supervision.  Pt ambulated  back to EOB with supervision and participated in neuro re-ed of RUE including shoulder ER/IR, shoulder extension with manual resistance, supine FF AAROM short arc, sidelying Abd AAROM, with TCs to promote and recruit desired musculature.  Pt sitting EOB, call bell in reach, bed alarm on at end of session.  Therapy Documentation Precautions:  Precautions Precautions: Fall Precaution Comments: R hemi Restrictions Weight Bearing Restrictions: No   Therapy/Group: Individual Therapy  Amie Critchley 12/31/2020, 3:56 PM

## 2020-12-31 NOTE — Progress Notes (Signed)
Physical Therapy Session Note  Patient Details  Name: [name] MRN: [id number] Date of Birth: 27-Dec-1960  Today's Date: 12/31/2020 PT Individual Time: 0915-1010 PT Individual Time Calculation (min): 55 min   Short Term Goals: Week 1:  PT Short Term Goal 1 (Week 1): Patient will transfer bed <> wc with supervision PT Short Term Goal 2 (Week 1): Patient will ambulate at least 179ft with LRAD and CGA PT Short Term Goal 3 (Week 1): Patient with negotiate at least 3 steps with LRAD and CGA  Skilled Therapeutic Interventions/Progress Updates:    Pt received supine in bed, awake and motivated to participate in therapy. No reports of pain but reports need to void. Supine<>sit mod I with bed features. Able to sit EOB with SBA. Sit<>stand with supervision to RW and ambulated with close supervision to bathroom. Able to manage lower body dressing in standing with supervision, appropriate controlled lowering to toilet and pt was continent of bladder, charted. Sit<>stand from toilet with distant supervision and ambulated sinkside where she was able to perform hand hygiene with close supervision. Ambulated back to EOB with supervision and RW. Removed non-skid socks with supervision and able to don shoes with seutpA.   Ambulated from her room to main therapy gym with CGA and RW, ~143ft, cues for improving R knee flexion and swing and monitoring R ankle to reduce excessive inversion. Once seated edge of mat, ace wrapped her R foot into DF assist to improve foot clearance and knee stability. Gait trial x165ft with supervision and RW, improved R foot clearance with ace wrap assist but demo's R knee hyperextension in terminal stance with difficulty correcting. Applied tapered wedge to R heel and reapplied ace wrap. Gait trial x223ft with supervision and RW and noted improved R knee stability! Pt also reports improved comfort with knee hyperextending ~15% of the time. Performed lateral side stepping L<>R, 69ft x 7  bouts, with CGA and no AD, focusing on hip strengthening, dynamic balance, and clearing feet.   Pt c/o L hip pain after gait training and also c/o DOMS of R quad from busy day of yesterday's therapy. Performed supine<>sit with supervision onto the mat table with large wedge behind back for comfort. Provided her with PROM for L hip external rotators, bias towards piriformis and TFL stretches. Also provided proximal/distal hamstring stretches, primarily for her involved RLE with emphasis on heel cord stretch. After completion, pt subjectively reports much improved hip tightness and discomfort. She performed repeated bridges with isometric holds, x10 reps for 5 sec holds. Supine<>sit with supervision from mat table. Ambulated back to her room with supervision and RW where she ended session seated in w/c with safety belt alarm on and her needs within reach.   Therapy Documentation Precautions:  Precautions Precautions: Fall Precaution Comments: R hemi Restrictions Weight Bearing Restrictions: No  Therapy/Group: Individual Therapy  Kearston Putman P Bristyl Mclees PT 12/31/2020, 7:29 AM

## 2020-12-31 NOTE — Patient Care Conference (Signed)
Inpatient RehabilitationTeam Conference and Plan of Care Update Date: 12/31/2020   Time: 11:23 AM      Patient Name: [name]      Medical Record Number: [id number]  Date of Birth: 20-Apr-1961 Sex: Female         Room/Bed: 5K53Z/7Q73A-19 Payor Info: Payor: /    Admit Date/Time:  12/26/2020  2:45 PM  Primary Diagnosis:  Left pontine stroke [facility])  Hospital Problems: Principal Problem:   Left pontine stroke (HCC) Active Problems:   Strain of right rotator cuff capsule   Migraine without status migrainosus, not intractable   Uncomplicated asthma   Right hemiparesis (HCC)   Slow transit constipation    Expected Discharge Date: Expected Discharge Date: 01/08/21  Team Members Present: Physician leading conference: Dr. Maryla Morrow Care Coodinator Present: Chana Bode, RN, BSN, CRRN;Christina Letona, BSW Nurse Present: Regino Schultze, RN PT Present: Wynelle Link, PT OT Present: Dolphus Jenny, OT PPS Coordinator present : Fae Pippin, [place], PT     Current Status/Progress Goal Weekly Team Focus  Bowel/Bladder   Continent of B&B, constipation PRN miralax given on 1/31  Regular BM's, remain continent  address constipation, ? schedule senna S   Swallow/Nutrition/ Hydration             ADL's   CGA overall  supervision overall  ADL training, RUE NMR, pt education   Mobility   supervision bed mobility, CGA transfers, gait ~16ft with CGA and RW. Stairs minA  supervision/mod I  RLE NMR, gait training with LRAD, stair training, dynamic standing balance   Communication             Safety/Cognition/ Behavioral Observations            Pain   continues to C/O right shoulder (RTC) discomfort, she feels voltaren is helping, along with PRN tylenol & ultram  Pain score < 2  offer PRN tylenol and ultram to better manage pain   Skin   No skin issues  Remain free of skin breakdown  Assess skin qshift and PRN     Discharge Planning:  goal to d/c home with  spouse (retired). Currently uninsured, reports plan is effective 2/1   Team Discussion: Chronic issues and premorbid RTC more worrisome than left pons stroke.  Patient on target to meet rehab goals: yes, currently supervision for self care, mod I for transfers and min assist for gait.  *See Care Plan and progress notes for long and short-term goals.   Revisions to Treatment Plan:  Neuro re-education right UE Teaching Needs: Transfers, toielting, medications, secondary stroke risk management, etc.  Current Barriers to Discharge: Lack of insurance, coverage for follow up services,  and DME  Possible Resolutions to Barriers: Family education AFO for right foot drop    Medical Summary Current Status: Functional deficits and right hemiparesis secondary to left pontine infarct  Barriers to Discharge: Medical stability  Barriers to Discharge Comments: Prior right RTC injury, lack of insurance for outpatient therapies/DME Possible Resolutions to [employer] Focus: Therapies, optimize bowel meds, optimize shoulder pain meds   Continued Need for Acute Rehabilitation Level of Care: The patient requires daily medical management by a physician with specialized training in physical medicine and rehabilitation for the following reasons: Direction of a multidisciplinary physical rehabilitation program to maximize functional independence : Yes Medical management of patient stability for increased activity during participation in an intensive rehabilitation regime.: Yes Analysis of laboratory values and/or radiology reports with any subsequent need for medication adjustment and/or  medical intervention. : Yes   I attest that I was present, lead the team conference, and concur with the assessment and plan of the team.   Chana Bode B 12/31/2020, 1:22 PM

## 2020-12-31 NOTE — Progress Notes (Signed)
Physical Therapy Session Note  Patient Details  Name: [name] MRN: [id number] Date of Birth: 04-04-1961  Today's Date: 12/31/2020 PT Individual Time: 1658-1750 PT Individual Time Calculation (min): 52 min   Short Term Goals: Week 1:  PT Short Term Goal 1 (Week 1): Patient will transfer bed <> wc with supervision PT Short Term Goal 2 (Week 1): Patient will ambulate at least 148ft with LRAD and CGA PT Short Term Goal 3 (Week 1): Patient with negotiate at least 3 steps with LRAD and CGA  Skilled Therapeutic Interventions/Progress Updates:    Pt received supine in bed with her significant other present and pt agreeable to therapy session. Supine>sitting L EOB, HOB elevated and bedrails available, supervision. Donned R LE ankle DF assist ACE wrap. Sit<>stands with and without AD with close supervision/CGA for safety/steadying. Gait training ~132ft to main therapy gym using RW with CGA for steadying - cuing for increased hamstring activation earlier in swing phase for improved foot clearance with pt activating but lacking sufficient motor recruitment. Standing at stairs using 1st 6" step and B UE support on HRs performed the following R LE NMR tasks:  - staggered stance R LE stepping over yoga block in hip extended position to replicate when hamstrings activate during swing phase of gait followed by bringing foot forward and placing on the step - therapist manually facilitating correct movement and blocking hip hiking compensation with cuing for improvement - attempted same task with 2.5lb ankle weight donned to try and increase motor recruitment but not successful (maybe needed more weight or maybe not appropriate for this yet)  - had pt perform knee flexion to place foot onto stool with assist and then perform hip extension with knee in flexed position by scooting stool backwards  - hamstring curls to kick external target of a chair with manual facilitation for increased ROM  - applied NMES  using Empi device on large muscle NMES setting for ~5 minutes while facilitating hamstring curls to continue kicking heel to a chair placed behind pt; unable to achieve large visible muscle contraction - may need to try other e-stim device or continue adjusting pad placement - no adverse events from NMES Gait training back to room as above while therapist continuing to block hip hiking and facilitating increased hip flexion during swing and increased hip abductor activation during stance. Pt left sitting EOB with needs in reach, bed alarm on, and meal tray set-up.  Therapy Documentation Precautions:  Precautions Precautions: Fall Precaution Comments: R hemi Restrictions Weight Bearing Restrictions: No  Pain:   Reports L LE becomes sore due to her co-contracting the muscles in that LE when attempting to move R LE - allowed seated rest breaks for pain management.   Therapy/Group: Individual Therapy  Ginny Forth , PT, DPT, CSRS  12/31/2020, 12:30 PM

## 2020-12-31 NOTE — Progress Notes (Signed)
Occupational Therapy Session Note  Patient Details  Name: [name] MRN: [id number] Date of Birth: [birth year]  Today's Date: 12/31/2020 OT Individual Time: 1140-1210 OT Individual Time Calculation (min): 30 min    Short Term Goals: Week 1:  OT Short Term Goal 1 (Week 1): Pt will donn a pullover shirt with supervision for two consecutive attempts. OT Short Term Goal 2 (Week 1): Pt will complete LB dressing with no more than min assist for two consecutive attempts. OT Short Term Goal 3 (Week 1): Pt will use the RUE as a gross assist during selfcare tasks with mod facilitation. OT Short Term Goal 4 (Week 1): Pt will complete walk-in shower transfer with min guard assist using the RW for support.  Skilled Therapeutic Interventions/Progress Updates:    Pt supine in bed, reports recent nausea, but feeling better now, agreeable to OT session. Pt complaining of right neck pain.  Pt completed supine to sit with mod I, Stand pivot EOB to w/c with supervision.  Pt instructed through gross grasp, 3 jaw chuck pinch, lateral pinch, and composite extension using soft resistance putty to right hand to facilitate increased strength and fine/gross motor control for improved functional use.  Pt required min to mod intermittent VCs for positioning.  Pt completed AROM right isolated wrist extension against gravity 3 x 10 reps.  Instructed pt through right upper traps and levator scapulae stretch to reduce pain and strain.  Pt reports reduced pain after stretch performed.  Call bell in reach, seat alarm on at end of session.  Therapy Documentation Precautions:  Precautions Precautions: Fall Precaution Comments: R hemi Restrictions Weight Bearing Restrictions: No   Therapy/Group: Individual Therapy  Amie Critchley 12/31/2020, 12:44 PM

## 2021-01-01 NOTE — Progress Notes (Signed)
[name] PHYSICAL MEDICINE & REHABILITATION PROGRESS NOTE   Subjective/Complaints: Patient seen laying in bed this morning.  She states she slept very well overnight.  She notes that she is having bowel movements.  She states she would like to leave sooner than anticipated discharge date.  ROS: Denies [name], [name], [name]  Objective:   No results found. No results [name] input(s): [name], [name], [name], [name] input(s): NA, K, CL, CO2, GLUCOSE, BUN, CREATININE, CALCIUM in the last 72 hours. No intake or output data in the 24 hours ending 01/01/21 1115      Physical Exam: Vital Signs Blood pressure 120/63, pulse 64, temperature 97.8 F (36.6 C), temperature source Oral, resp. rate 16, height 5\' 4"  (1.626 m), weight 80.5 kg, SpO2 97 %. Constitutional: No distress . Vital signs reviewed. HENT: Normocephalic.  Atraumatic. Eyes: EOMI. No discharge. Cardiovascular: No JVD.  RRR. Respiratory: Normal effort.  No stridor.  Bilateral clear to auscultation. GI: Non-distended.  BS +. Skin: Warm and dry.  Intact. Psych: Normal mood.  Normal behavior. Musc: Right shoulder pain improving No edema Neurologic: Alert Motor: LUE/LE: 5/5 proximal distal RUE: Shoulder abduction, elbow flexion/extension 3+/5, wrist extension, handgrip 3+/5, improving Right lower extremity: Hip flexion 4+/5, knee extension 4/5, ankle dorsiflexion 4/5   Assessment/Plan: 1. Functional deficits which require 3+ hours per day of interdisciplinary therapy in a comprehensive inpatient rehab setting.  Physiatrist is providing close team supervision and 24 hour management of active medical problems listed below.  Physiatrist and rehab team continue to assess barriers to discharge/monitor patient progress toward functional and medical goals  Care Tool:  Bathing    Body parts bathed by patient: Right arm,Chest,Abdomen,Front perineal area,Buttocks,Right upper leg,Left upper leg,Right lower leg,Left  lower leg,Face,Left arm   Body parts bathed by helper: Left arm     Bathing assist Assist Level: Minimal Assistance - Patient > 75%     Upper Body Dressing/Undressing Upper body dressing   What is the patient wearing?: Pull over shirt,Bra    Upper body assist Assist Level: Minimal Assistance - Patient > 75%    Lower Body Dressing/Undressing Lower body dressing      What is the patient wearing?: Underwear/pull up,Pants     Lower body assist Assist [name] lower body dressing: Supervision/Verbal cueing     Toileting Toileting Toileting Activity did not occur (Clothing management and hygiene only): N/A (no void or bm)  Toileting assist Assist [name] toileting: Supervision/Verbal cueing     Transfers Chair/bed transfer  Transfers assist     Chair/bed transfer assist level: Supervision/Verbal cueing Chair/bed transfer assistive device: assist      Assist level: Contact Guard/Touching assist Assistive device: Walker-rolling Max distance: 162ft   Walk 10 feet activity   Assist     Assist level: Contact Guard/Touching assist Assistive device: Walker-rolling   Walk 50 feet activity   Assist    Assist level: Contact Guard/Touching assist Assistive device: Walker-rolling    Walk 150 feet activity   Assist Walk 150 feet activity did not occur: Safety/medical concerns  Assist level: Contact Guard/Touching assist Assistive device: Walker-rolling    Walk 10 feet on uneven surface  activity   Assist Walk 10 feet on uneven surfaces activity did not occur: Safety/medical concerns         Wheelchair     Assist Will patient use wheelchair at discharge?: No  Wheelchair 50 feet with 2 turns activity    Assist            Wheelchair 150 feet activity     Assist          Medical Problem List and Plan: 1.Functional deficits and right hemiparesissecondary to left pontine  infarct  Continue CIR  Right WHO, PRAFO [name] night-time wear 2. Antithrombotics: -DVT/anticoagulation:Pharmaceutical:Lovenox -antiplatelet therapy: DAPT 3. Pain Management/ pre-morbid right RTC injury:tylenol [name] mild pain, tramadol [name] more severe pain -add sports cream bid DC'd -support right upper ext in bed/chair -ROM exercises with PT/OT  Voltaren gel ordered on 2/1, improving 4. Mood:LCSW to follow [name] evaluation and support. -antipsychotic agents: N/a 5. Neuropsych: This patientiscapable of making decisions on herown behalf. 6. Skin/Wound Care:Routine pressure relief measures.  BMP within acceptable range on 1/31 7. Fluids/Electrolytes/Nutrition:Monitor I/Os.  8. Blood pressures: Higher BP goal recommended and toavoid hypotension.  Vitals:   12/31/20 1936 01/01/21 0552  BP: 122/60 120/63  Pulse: 64 64  Resp: 16 16  Temp: 98.5 F (36.9 C) 97.8 F (36.6 C)  SpO2: 94% 97%   Relatively controlled on 2/3 9. H/o Migraines: Topamax.  Controlled on 2/3 10. 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  Controlled on 2/3  Monitor with increased exertion 11. H/o depression: Stable on current dose Celexa. 12. Slow transit constipation  Bowel meds increased on 2/2  Improving   LOS: 6 days A FACE TO FACE EVALUATION WAS PERFORMED  [name] [name] 01/01/2021, 11:15 AM

## 2021-01-01 NOTE — Progress Notes (Signed)
Occupational Therapy Session Note  Patient Details  Name: [name] MRN: [id number] Date of Birth: [birth year]  Today's Date: 01/01/2021 OT Individual Time: 1500-1600 OT Individual Time Calculation (min): 60 min    Short Term Goals: Week 1:  OT Short Term Goal 1 (Week 1): Pt will donn a pullover shirt with supervision for two consecutive attempts. OT Short Term Goal 2 (Week 1): Pt will complete LB dressing with no more than min assist for two consecutive attempts. OT Short Term Goal 3 (Week 1): Pt will use the RUE as a gross assist during selfcare tasks with mod facilitation. OT Short Term Goal 4 (Week 1): Pt will complete walk-in shower transfer with min guard assist using the RW for support.  Skilled Therapeutic Interventions/Progress Updates:    Pt supine in bed, with washcloth over eyes, reporting she has a migraine and not feeling up to OT session at the moment.  Pt also expressing strong desire to discharge home sooner than set discharge date, wanting to go home this weekend or Monday at the latest.  Pt reports she is willing and able to attend outpatient therapies private pay as needed as well.  OT collaborated with interdisciplinary team including PT, CSW, and MD regarding pt's dc planning in order to provide pt centered care.  Also discussed with pts husband regarding need for caregiver education sessions to ensure safe discharge to home setting; pts husband reports he is available tomorrow for caregiver ed participation.  Pt agreeable thereafter to participate in OT session.  Pt completed supine to sit with mod I.  Stand pivot to w/c with supervision.  Transported to ADL suite for time mgt.  Educated pt regarding safe shower transfer with 2 inch threshold step over using RW, however pt reports she doesn't want to use walker and would like to complete without AD.  Pt required CGA and hand held assist to complete without AD x 2 trials.  Pt transported back to room.  Supervision required  for stand pivot w/c to EOB due to pt attempting to stand with w/c brakes unlocked.  Mod I sit to supine. Call bell in reach, bed alarm on.    Therapy Documentation Precautions:  Precautions Precautions: Fall Precaution Comments: R hemi Restrictions Weight Bearing Restrictions: No   Therapy/Group: Individual Therapy  Amie Critchley 01/01/2021, 5:34 PM

## 2021-01-01 NOTE — Progress Notes (Signed)
Physical Therapy Session Note  Patient Details  Name: [name] MRN: [id number] Date of Birth: 05/22/1961  Today's Date: 01/01/2021 PT Individual Time: 0800-0900 + 1130-1155 PT Individual Time Calculation (min): 60 min + 25 min  Short Term Goals: Week 1:  PT Short Term Goal 1 (Week 1): Patient will transfer bed <> wc with supervision PT Short Term Goal 2 (Week 1): Patient will ambulate at least 176ft with LRAD and CGA PT Short Term Goal 3 (Week 1): Patient with negotiate at least 3 steps with LRAD and CGA  Skilled Therapeutic Interventions/Progress Updates:     1st session: Pt greeted supine in bed, awake and agreeable to therapy. Reports 3/10 R shldr pain, RN notified for pain medication. Supine<>sit with supervision with use of bed features. Able to remove her socks indep via figure-4 technique and donned shoes with totalA for time management. R shoe with tapered heel wedge and ace wrapped R foot into DF assist. Sit<>Stand mod I to RW and she ambulated ~174ft to main therapy gym with close supervision and RW. Noted improved R knee flexion and swing and improved R knee stability as well during terminal stance. Provided patient with bag for her RW to improve mod I in household, pt appreciative. Gait training without AD, ambulating 2x131ft with CGA + 2x16ft with CGA. Required seated rest breaks. Noted increased unsteadiness without formal LOB but patient reporting feeling "weak in her knees" after doing this. Performed NMR to focus on R hamstring facilitation: -in // bars with CGA and BUE support on bars, performed hamstring curls onto 1.5inch block, repeatedly to fatigue with several sets. -supine on mat table with RLE on large therapy ball performing roll ins and roll outs to promote hip flex/knee flex combination.  -supine on mat table with RLE on large therapy ball isometric hamstring curls -supine on mat table with RLE on large therapy ball isometric reversals with therapist providing  random perturbations to ball, instructing her to "not let the ball move."  Ambulated back to her room, 131ft, with close supervision and RW and ended session seated in w/c with 1/2 lap tray supporting her RUE, needs within reach. Pt pleased with progress but remains adamant on going home as soon as possible.   2nd session: Pt greeted supine in bed, awake and agreeable to therapy. No reports of pain. Supine<>sit mod I with bed features. Sit<>stand with supervision to RW and she ambulated from her room to dayroom gym, ~172ft, with close supervision. Pt not wearing shoes and did not ace wrap for DF assist for time management purposes. Transferred to Nustep with CGA and RW. She performed x10 minutes at workload of 3, using BLE's only, focusing on R knee/hip extension. Pt reporting 15/20 on the RPE scale after completion. She ambulated back to her room with RW and supervision and then to the bathroom where pt was continent of bladder, charted. Ambulated with RW and supervision to the sink where she performed hand hygiene with CGA. Able to ambulate back to bed with RW and sit>supine mod I with bed features. She ended session supine in bed with needs in reach, bed alarm on.   Therapy Documentation Precautions:  Precautions Precautions: Fall Precaution Comments: R hemi Restrictions Weight Bearing Restrictions: No   Therapy/Group: Individual Therapy  Eisa Conaway P Ariabella Brien PT 01/01/2021, 7:48 AM

## 2021-01-01 NOTE — Progress Notes (Signed)
Occupational Therapy Session Note  Patient Details  Name: [name] MRN: [id number] Date of Birth: [birth year]  Today's Date: 01/01/2021 OT Individual Time: 8127-5170 OT Individual Time Calculation (min): 75 min    Short Term Goals: Week 1:  OT Short Term Goal 1 (Week 1): Pt will donn a pullover shirt with supervision for two consecutive attempts. OT Short Term Goal 2 (Week 1): Pt will complete LB dressing with no more than min assist for two consecutive attempts. OT Short Term Goal 3 (Week 1): Pt will use the RUE as a gross assist during selfcare tasks with mod facilitation. OT Short Term Goal 4 (Week 1): Pt will complete walk-in shower transfer with min guard assist using the RW for support.  Skilled Therapeutic Interventions/Progress Updates:    Patient seated in w/c, alert and ready for therapy session, she is aware of needs and denies pain.  Sit to stand and ambulation without AD to/from w/c and toilet with CGA.   toileting completed with CS.  She returned to bed surface where she completed bathing and changed clothing - min A for bathing left UE, min A for bra/OH shirt and CS for LB dressing/bathing/footwear.  She completed oral care in stance at sink with CS - min cues for hand placement.  Ambulation with RW on unit (right dorsi-wrap assist) CS.    Completed seated and standing right arm NMRE to include weight bearing, inhibition of flexors t/o,  facilitation of elbow extensors, ER/flex/abd, scapular stability, motor control, posture - good response to all activities with fatigue noted - she was able to take resistance at elbow at close of session and note improved proximal mobility.  Provided dycem and reviewed functional use.  She returned to bed at close of session, CS for sit to supine.  bed alarm set and call bell in hand.      Therapy Documentation Precautions:  Precautions Precautions: Fall Precaution Comments: R hemi Restrictions Weight Bearing Restrictions:  No   Therapy/Group: Individual Therapy  Barrie Lyme 01/01/2021, 7:34 AM

## 2021-01-02 NOTE — Progress Notes (Signed)
[facility] PHYSICAL MEDICINE & REHABILITATION PROGRESS NOTE   Subjective/Complaints: Patient seen sitting up in bed this morning.  She states she slept well overnight.  She states she was worried yesterday because she had a migraine and she also had a migraine prior to her previous stroke.  Discussed with team and patient plans for discharge-we will plan for discharge on Monday  ROS: Denies CP, SOB, N/V/D  Objective:   No results found. No results for input(s): WBC, HGB, HCT, PLT in the last 72 hours. No results for input(s): NA, K, CL, CO2, GLUCOSE, BUN, CREATININE, CALCIUM in the last 72 hours.  Intake/Output Summary (Last 24 hours) at 01/02/2021 1229 Last data filed at 01/02/2021 0724 Gross per 24 hour  Intake 477 ml  Output --  Net 477 ml        Physical Exam: Vital Signs Blood pressure 133/66, pulse 67, temperature 97.6 F (36.4 C), temperature source Oral, resp. rate 16, height 5\' 4"  (1.626 m), weight 80.5 kg, SpO2 96 %. Constitutional: No distress . Vital signs reviewed. HENT: Normocephalic.  Atraumatic. Eyes: EOMI. No discharge. Cardiovascular: No JVD.  RRR. Respiratory: Normal effort.  No stridor.  Bilateral clear to auscultation. GI: Non-distended.  BS +. Skin: Warm and dry.  Intact. Psych: Normal mood.  Normal behavior. Musc: Right shoulder pain improving No edema Neurologic: Alert Motor: LUE/LE: 5/5 proximal distal RUE: Shoulder abduction, elbow flexion/extension 4-/5, wrist extension, handgrip 4-/5, improving Right lower extremity: Hip flexion 4+/5, knee extension 4+/5, ankle dorsiflexion 4/5   Assessment/Plan: 1. Functional deficits which require 3+ hours per day of interdisciplinary therapy in a comprehensive inpatient rehab setting.  Physiatrist is providing close team supervision and 24 hour management of active medical problems listed below.  Physiatrist and rehab team continue to assess barriers to discharge/monitor patient progress toward functional  and medical goals  Care Tool:  Bathing    Body parts bathed by patient: Right arm,Chest,Abdomen,Front perineal area,Buttocks,Right upper leg,Left upper leg,Right lower leg,Left lower leg,Face,Left arm   Body parts bathed by helper: Left arm     Bathing assist Assist Level: Minimal Assistance - Patient > 75%     Upper Body Dressing/Undressing Upper body dressing   What is the patient wearing?: Pull over shirt,Bra    Upper body assist Assist Level: Minimal Assistance - Patient > 75%    Lower Body Dressing/Undressing Lower body dressing      What is the patient wearing?: Underwear/pull up,Pants     Lower body assist Assist for lower body dressing: Supervision/Verbal cueing     Toileting Toileting Toileting Activity did not occur (Clothing management and hygiene only): N/A (no void or bm)  Toileting assist Assist for toileting: Supervision/Verbal cueing     Transfers Chair/bed transfer  Transfers assist     Chair/bed transfer assist level: Independent with assistive device Chair/bed transfer assistive device: assist      Assist level: Supervision/Verbal cueing Assistive device: Walker-rolling Max distance: 135ft   Walk 10 feet activity   Assist     Assist level: Supervision/Verbal cueing Assistive device: Walker-rolling   Walk 50 feet activity   Assist    Assist level: Supervision/Verbal cueing Assistive device: Walker-rolling    Walk 150 feet activity   Assist Walk 150 feet activity did not occur: Safety/medical concerns  Assist level: Supervision/Verbal cueing Assistive device: Walker-rolling    Walk 10 feet on uneven surface  activity   Assist Walk 10 feet on uneven surfaces  activity did not occur: Safety/medical concerns   Assist level: Supervision/Verbal cueing Assistive device: Photographer Will patient use wheelchair at discharge?: No              Wheelchair 50 feet with 2 turns activity    Assist            Wheelchair 150 feet activity     Assist          Medical Problem List and Plan: 1.Functional deficits and 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 Antithrombotics: -DVT/anticoagulation:Pharmaceutical:Lovenox -antiplatelet therapy: DAPT 3. Pain Management/ pre-morbid right RTC injury:tylenol for mild pain, tramadol for more severe pain -add sports cream bid DC'd -support right upper ext in bed/chair -ROM exercises with PT/OT  Voltaren gel ordered on 2/1, continues to improve  Migraines controlled with current meds 4. Mood:LCSW to follow for evaluation and support. -antipsychotic agents: N/a 5. Neuropsych: This patientiscapable of making decisions on herown behalf. 6. Skin/Wound Care:Routine pressure relief measures.  BMP within acceptable range on 1/31 7. Fluids/Electrolytes/Nutrition:Monitor I/Os.  8. Blood pressures: Higher BP goal recommended and toavoid hypotension.  Vitals:   01/01/21 1929 01/02/21 0414  BP: (!) 112/57 133/66  Pulse: 74 67  Resp: 18 16  Temp: 98 F (36.7 C) 97.6 F (36.4 C)  SpO2: 95% 96%   Relatively controlled on 2/4 9. H/o Migraines: Topamax.  Controlled on 2/4 10. 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  Controlled on 2/4  Monitor with increased exertion 11. H/o depression: Stable on current dose Celexa. 12. Slow transit constipation  Bowel meds increased on 2/2  Improved   LOS: 7 days A FACE TO FACE EVALUATION WAS PERFORMED  Corinthian Kemler Karis Juba 01/02/2021, 12:29 PM

## 2021-01-02 NOTE — Progress Notes (Signed)
Orthopedic Tech Progress Note Patient Details:  [name] 05-05-1961 [id number] Called in order to [facility] for an AFO CONSULT Patient ID: [name], female   DOB: 07-31-1961, 59 y.o.   MRN: [id number]   [name] 01/02/2021, 9:16 AM

## 2021-01-02 NOTE — Progress Notes (Signed)
Occupational Therapy Discharge Summary  Patient Details  Name: [name] MRN: [id number] Date of Birth: 09-17-1961  Today's Date: 01/04/2021  Patient has met 7 of 59 long term goals due to improved activity tolerance, improved balance, ability to compensate for deficits, functional use of  RIGHT upper and RIGHT lower extremity and improved coordination.  Patient to discharge at overall Supervision level.  Patient's care partner is independent to provide the necessary physical assistance at discharge.    Reasons goals not met: Pt requires setup for self feeding due to impaired RUE motor control and pt requires min assist for UB dressing due to tight fitting clothing; pt able to complete UB dressing with loose fit clothing at goal level.  Recommendation:  Patient will benefit from ongoing skilled OT services in outpatient setting to continue to advance functional skills in the area of iADL and neuro re-ed of RUE.  Equipment: No equipment provided; pt purchased shower seat.  Reasons for discharge: treatment goals met and discharge from hospital  Patient/family agrees with progress made and goals achieved: Yes  OT Discharge Precautions/Restrictions  Precautions Precautions: Fall Precaution Comments: R hemi Restrictions Weight Bearing Restrictions: No Pain Pain Assessment Pain Scale: 0-10 Pain Score: 0-No pain ADL ADL Eating: Set up Where Assessed-Eating: Chair Grooming: Setup Where Assessed-Grooming: Standing at sink Upper Body Bathing: Modified independent Where Assessed-Upper Body Bathing: Shower Lower Body Bathing: Modified independent Where Assessed-Lower Body Bathing: Shower Upper Body Dressing: Minimal assistance Where Assessed-Upper Body Dressing: Chair Lower Body Dressing: Supervision/safety Where Assessed-Lower Body Dressing: Chair Toileting: Modified independent Where Assessed-Toileting: Glass blower/designer: Close supervision Armed forces technical officer Method:  Counselling psychologist: Raised toilet seat,Grab bars Tub/Shower Transfer: Not assessed Social research officer, government: Close supervision Social research officer, government Method: Ambulating Vision Baseline Vision/History: Wears glasses Wears Glasses: At all times Patient Visual Report: No change from baseline Vision Assessment?: No apparent visual deficits Perception  Perception: Within Functional Limits Praxis Praxis: Intact Cognition Overall Cognitive Status: Within Functional Limits for tasks assessed Arousal/Alertness: Awake/alert Orientation Level: Oriented X4 Attention: Sustained Sustained Attention: Appears intact Memory: Appears intact Awareness: Appears intact Problem Solving: Appears intact Safety/Judgment: Appears intact Sensation Sensation Light Touch: Appears Intact Hot/Cold: Appears Intact Proprioception: Appears Intact Stereognosis: Not tested Coordination Gross Motor Movements are Fluid and Coordinated: No Fine Motor Movements are Fluid and Coordinated: No Coordination and Movement Description: Blocked movement patterns in her leg due to weakness, especially in her knee flexors Finger Nose Finger Test: impaired RUE due to weakness and motor control impairments Motor  Motor Motor: Hemiplegia Motor - Discharge Observations: R hemiplegia  UE>LE. Much improved since date of evaluation Mobility  Bed Mobility Bed Mobility: Supine to Sit;Rolling Right;Rolling Left;Sit to Supine Rolling Right: Independent Rolling Left: Independent Supine to Sit: Independent Sit to Supine: Independent Transfers Sit to Stand: Independent with assistive device Stand to Sit: Independent with assistive device  Trunk/Postural Assessment  Cervical Assessment Cervical Assessment: Within Functional Limits Thoracic Assessment Thoracic Assessment: Within Functional Limits Lumbar Assessment Lumbar Assessment: Within Functional Limits Postural Control Postural Control: Within  Functional Limits  Balance Balance Balance Assessed: Yes Standardized Balance Assessment Standardized Balance Assessment: Berg Balance Test Static Sitting Balance Static Sitting - Balance Support: Feet supported Static Sitting - Level of Assistance: 7: Independent Dynamic Sitting Balance Dynamic Sitting - Balance Support: During functional activity Dynamic Sitting - Level of Assistance: 5: Stand by assistance Static Standing Balance Static Standing - Level of Assistance: 6: Modified independent (Device/Increase time) Dynamic Standing Balance Dynamic Standing - Balance Support:  No upper extremity supported Dynamic Standing - Level of Assistance: 5: Stand by assistance Extremity/Trunk Assessment RUE Assessment RUE Assessment: Within Functional Limits Passive Range of Motion (PROM) Comments: PROM WFLs for all joints General Strength Comments: [name] needs max hand over hand for integration into selfcare tasks as a stabilizer, however improved since IE.  Pt also with recent history of rotator cuff straing prior to this CVA. LUE Assessment LUE Assessment: Within Functional Limits   Ezekiel Slocumb 01/02/2021, 4:34 PM

## 2021-01-02 NOTE — Discharge Summary (Signed)
Physical Therapy Discharge Summary  Patient Details  Name: [name] MRN: [id number] Date of Birth: 1961/01/03  Patient has met 9 of 9 long term goals due to improved activity tolerance, improved balance, improved postural control, increased strength, ability to compensate for deficits and functional use of  right upper extremity and right lower extremity.  Patient to discharge at an ambulatory level Supervision.   Patient's care partner is independent to provide the necessary physical assistance at discharge.  Reasons goals not met: n/a  Recommendation:  Patient will benefit from ongoing skilled PT services in outpatient setting to continue to advance safe functional mobility, address ongoing impairments in R hemibody weakness, stroke recovery, balance impairments, and gait deficits in order to minimize fall risk.  Equipment: Pt self purchased a RW  Reasons for discharge: treatment goals met and discharge from hospital  Patient/family agrees with progress made and goals achieved: Yes  PT Discharge Precautions/Restrictions Precautions Precautions: Fall Precaution Comments: R hemi Restrictions Weight Bearing Restrictions: No Vision/Perception  Perception Perception: Within Functional Limits Praxis Praxis: Intact  Cognition Overall Cognitive Status: Within Functional Limits for tasks assessed Arousal/Alertness: Awake/alert Orientation Level: Oriented X4 Attention: Sustained Sustained Attention: Appears intact Memory: Appears intact Awareness: Appears intact Problem Solving: Appears intact Safety/Judgment: Appears intact Sensation Sensation Light Touch: Appears Intact Hot/Cold: Appears Intact Proprioception: Appears Intact Stereognosis: Appears Intact Coordination Gross Motor Movements are Fluid and Coordinated: No Fine Motor Movements are Fluid and Coordinated: No Coordination and Movement Description: Blocked movement patterns in her leg due to weakness,  especially in her knee flexors Motor  Motor Motor: Hemiplegia Motor - Discharge Observations: R hemiplegia  UE>LE. Much improved since date of evaluation  Mobility Bed Mobility Bed Mobility: Supine to Sit;Rolling Right;Rolling Left;Sit to Supine Rolling Right: Independent Rolling Left: Independent Supine to Sit: Independent Sit to Supine: Independent Transfers Transfers: Sit to Stand;Stand to Sit;Stand Pivot Transfers Sit to Stand: Independent with assistive device Stand to Sit: Independent with assistive device Stand Pivot Transfers: Independent with assistive device Transfer (Assistive device): Rolling walker Locomotion  Gait Ambulation: Yes Gait Assistance: Supervision/Verbal cueing Gait Distance (Feet): 200 Feet Assistive device: Rolling walker (with RW gutter splint for RUE) Gait Assistance Details: Verbal cues for gait pattern;Verbal cues for precautions/safety;Verbal cues for safe use of DME/AE;Verbal cues for technique;Verbal cues for sequencing Gait Gait: Yes Gait Pattern: Impaired Gait Pattern: Step-through pattern;Decreased step length - right;Decreased stance time - right;Decreased hip/knee flexion - right;Decreased dorsiflexion - right;Decreased weight shift to right;Right circumduction;Right hip hike;Right genu recurvatum;Poor foot clearance - right Gait velocity: decreased High Level Ambulation High Level Ambulation: Side stepping Side Stepping: Unsupported side stepping L<>R ~71f with CGA Stairs / Additional Locomotion Stairs: Yes Stairs Assistance: Supervision/Verbal cueing Stair Management Technique: One rail Left Number of Stairs: 12 Height of Stairs: 6 Wheelchair Mobility Wheelchair Mobility: No  Trunk/Postural Assessment  Cervical Assessment Cervical Assessment: Within Functional Limits Thoracic Assessment Thoracic Assessment: Within Functional Limits Lumbar Assessment Lumbar Assessment: Within Functional Limits Postural Control Postural Control:  Within Functional Limits  Balance Standardized Balance Assessment Standardized Balance Assessment: Berg Balance Test Berg Balance Test Sit to Stand: Able to stand without using hands and stabilize independently Standing Unsupported: Able to stand safely 2 minutes Sitting with Back Unsupported but Feet Supported on Floor or Stool: Able to sit safely and securely 2 minutes Stand to Sit: Sits safely with minimal use of hands Transfers: Able to transfer safely, minor use of hands Standing Unsupported with Eyes Closed: Able to stand 10 seconds safely Standing  Ubsupported with Feet Together: Able to place feet together independently and stand 1 minute safely From Standing, Reach Forward with Outstretched Arm: Can reach forward >12 cm safely (5") From Standing Position, Pick up Object from Floor: Able to pick up shoe safely and easily From Standing Position, Turn to Look Behind Over each Shoulder: Looks behind from both sides and weight shifts well Turn 360 Degrees: Able to turn 360 degrees safely but slowly Standing Unsupported, Alternately Place Feet on Step/Stool: Able to stand independently and safely and complete 8 steps in 20 seconds Standing Unsupported, One Foot in Front: Able to place foot tandem independently and hold 30 seconds Standing on One Leg: Able to lift leg independently and hold > 10 seconds Total Score: 53 Extremity Assessment   RLE Assessment RLE Assessment: Exceptions to [facility] RLE Strength Right Hip Flexion: 3+/5 Right Knee Flexion: 2-/5 Right Knee Extension: 4-/5 Right Ankle Dorsiflexion: 1/5 Right Ankle Plantar Flexion: 2+/5 LLE Assessment LLE Assessment: Within Functional Limits    Christian P Manhard PT Excell Seltzer, PT, DPT  01/02/2021, 3:59 PM

## 2021-01-02 NOTE — Progress Notes (Signed)
Occupational Therapy Session Note  Patient Details  Name: [name] MRN: [id number] Date of Birth: [birth year]  Today's Date: 01/02/2021 OT Individual Time: 1100-1205 OT Individual Time Calculation (min): 65 min    Short Term Goals: Week 1:  OT Short Term Goal 1 (Week 1): Pt will donn a pullover shirt with supervision for two consecutive attempts. OT Short Term Goal 2 (Week 1): Pt will complete LB dressing with no more than min assist for two consecutive attempts. OT Short Term Goal 3 (Week 1): Pt will use the RUE as a gross assist during selfcare tasks with mod facilitation. OT Short Term Goal 4 (Week 1): Pt will complete walk-in shower transfer with min guard assist using the RW for support.  Skilled Therapeutic Interventions/Progress Updates:    Pt semi upright in bed, husband at side ready for caregiver education with OT.  Pt requesting to use bathroom and take a shower.  Educated pts husband regarding safe assist technique during functional mobility including sit<>stand EOB, ambulation using RW, and toilet transfer using gait belt and providing CGA.  Pts husband return demonstrated requiring some encouragement and VC's to provide adequate amount of guarding.  Pt completed tub bench transfer at walk in shower with pts husband providing CGA.  Pt completed UB/LB bathing and dressing (to doff only) with supervision.  Pt required min assist provided by husband to donn bra and setup to donn shirt.  Min assist provided by husband for pt to donn underwear and pants.  Pt donned grip socks with setup.  Pts husband provided CGA while pt ambulated using RW bathroom to EOB and displaying increased participation and independence with providing assist.  Sit to supine with mod I.  Call bell in reach, bed alarm on.  Therapy Documentation Precautions:  Precautions Precautions: Fall Precaution Comments: R hemi Restrictions Weight Bearing Restrictions: No   Therapy/Group: Individual Therapy  Amie Critchley 01/02/2021, 1:01 PM

## 2021-01-02 NOTE — Progress Notes (Signed)
Physical Therapy Session Note  Patient Details  Name: [name] MRN: [id number] Date of Birth: September 25, 1961  Today's Date: 01/02/2021 PT Individual Time: 1420-1445 PT Individual Time Calculation (min): 25 min   Short Term Goals: Week 1:  PT Short Term Goal 1 (Week 1): Patient will transfer bed <> wc with supervision PT Short Term Goal 2 (Week 1): Patient will ambulate at least 139f with LRAD and CGA PT Short Term Goal 3 (Week 1): Patient with negotiate at least 3 steps with LRAD and CGA  Skilled Therapeutic Interventions/Progress Updates: Pt presented sitting EOB with husband present agreeable to therapy. Pt ambulated to day room with RW and supervision with pt demonstrating compensatory mechanics to allow for foot clearance. Session focused on hamstring strengthening via forced use. At high.low mat pt rolled to prone and performed AAROM hamstring curls 2 x 10. Pt then transferred to quadruped and participated in high kneeling while making meaningful conversation. Performed heel sit to tall kneeling 2 x 5. Pt ambulated back to room at end of session and performed bed transfer supervision. Pt left in bed at end of session with bed alarm on, call bell within reach and needs met.      Therapy Documentation Precautions:  Precautions Precautions: Fall Precaution Comments: R hemi Restrictions Weight Bearing Restrictions: No General:   Vital Signs: Therapy Vitals Temp: 98.6 F (37 C) Pulse Rate: 73 Resp: 18 BP: (!) 101/56 Patient Position (if appropriate): Lying Oxygen Therapy SpO2: 94 % O2 Device: Room Air Pain: Pain Assessment Pain Scale: 0-10 Pain Score: 1  Mobility: Bed Mobility Bed Mobility: Supine to Sit;Rolling Right;Rolling Left;Sit to Supine Rolling Right: Independent Rolling Left: Independent Supine to Sit: Independent Sit to Supine: Independent Transfers Transfers: Sit to Stand;Stand to Sit;Stand Pivot Transfers Sit to Stand: Independent with assistive  device Stand to Sit: Independent with assistive device Stand Pivot Transfers: Independent with assistive device Transfer (Assistive device): Rolling walker Locomotion : Gait Ambulation: Yes Gait Assistance: Supervision/Verbal cueing Gait Distance (Feet): 200 Feet Assistive device: Rolling walker (with RW gutter splint for RUE) Gait Assistance Details: Verbal cues for gait pattern;Verbal cues for precautions/safety;Verbal cues for safe use of DME/AE;Verbal cues for technique;Verbal cues for sequencing Gait Gait: Yes Gait Pattern: Impaired Gait Pattern: Step-through pattern;Decreased step length - right;Decreased stance time - right;Decreased hip/knee flexion - right;Decreased dorsiflexion - right;Decreased weight shift to right;Right circumduction;Right hip hike;Right genu recurvatum;Poor foot clearance - right Gait velocity: decreased High Level Ambulation High Level Ambulation: Side stepping Side Stepping: Unsupported side stepping L<>R ~175fwith CGA Stairs / Additional Locomotion Stairs: Yes Stairs Assistance: Supervision/Verbal cueing Stair Management Technique: One rail Left Number of Stairs: 12 Height of Stairs: 6 Wheelchair Mobility Wheelchair Mobility: No  Trunk/Postural Assessment : Cervical Assessment Cervical Assessment: Within Functional Limits Thoracic Assessment Thoracic Assessment: Within Functional Limits Lumbar Assessment Lumbar Assessment: Within Functional Limits Postural Control Postural Control: Within Functional Limits  Balance: Balance Balance Assessed: Yes Standardized Balance Assessment Standardized Balance Assessment: Berg Balance Test Exercises:   Other Treatments:      Therapy/Group: Individual Therapy  Guthrie Lemme 01/02/2021, 4:10 PM

## 2021-01-02 NOTE — Progress Notes (Signed)
Physical Therapy Session Note  Patient Details  Name: [name] MRN: [id number] Date of Birth: [birth year]  Today's Date: 01/02/2021 PT Individual Time: 0900-1000 + 1130-1200 PT Individual Time Calculation (min): 60 min + 30 min  Short Term Goals: Week 1:  PT Short Term Goal 1 (Week 1): Patient will transfer bed <> wc with supervision PT Short Term Goal 2 (Week 1): Patient will ambulate at least 144ft with LRAD and CGA PT Short Term Goal 3 (Week 1): Patient with negotiate at least 3 steps with LRAD and CGA  Skilled Therapeutic Interventions/Progress Updates:   1st session:    Pt received sitting up in bed, agreeable to therapy, no reports of pain but motivated to participate. Focus of session to perform family education/training as pt has requested to move her discharge date up to Monday, 2/7. Husband not present on arrival but shows up at around ~1030. Supine<>sit mod I with bed features. Donned shoes with setupA and ace wrapped her R foot into DF assist with totalA. Sit<>stand mod I to RW and she ambulated with close supervision and RW (with RW hand splint) to the main therapy gym, ~181ft. Retrieved ott[place]ock AFO to apply to her R foot for gait trials but despite multiple efforts we were unable to fit this into her slip on shoe. Educated her on proper shoewear for AFO such as wide fitting shoe vs lace-up shoes. Husband even bringing in her new shoes which were also slip on shoes and the AFO was unable to fit into this either. Focused remainder of session on family training with husband present throughout. Instructed them both on ace-wrapping her R foot and they took a video of therapist performing this. Also had husband provide hands-on training for donning the ace wrap which he did appropriately with mod cues from both therapist and patient. Patient with great ability to direct care. Ambulated hallway distances with supervision, educated husband on guarding techniques and supervision level at  DC. Performed car transfer with supervision and patient demonstrating ability to get into car height that simulated their mid-size. Performed stair training, navigating up/down x12 steps with close supervision and 1 hand rail. Requires min cues for stepping pattern (up with L foot leading, down with R foot leading). She then ambulated back to her room with supervision and RW and ended session seated EOB with needs in reach and husband at bedside. All questions and concerns addressed.   2nd session: Pt received supine in bed reading a book, husband at bedside, pt agreeable to therapy. No reports of pain. Supine<>sit mod I with bed features. Instructed husband on donning ace wrap for DF assist to her R foot. Pt with good ability to direct care when applying and pt receptive to feedback. Pt with tapered wedge in R shoe and she ambulated with close supervision and RW from her room to main therapy gym. In // bars, performed repeated hamstring curls to 2inch block, mirror for visual feedback for reducing compensatory trunk flexion and glut activation. Performed repeated sit<>stands with BUE's to R knee to promote R weight shift and weight bearing. Ace wrapped 3lb dumbbell to R hand and perform unilateral suitcase "squat" from the chair. She then performed lateral lunges to her weaker R side, 2x10 with CGA. Ambulated back to her room with supervision and RW, ended session seated EOB with needs in reach, husband at bedside, awaiting OT to arrive.  Therapy Documentation Precautions:  Precautions Precautions: Fall Precaution Comments: R hemi Restrictions Weight Bearing Restrictions: No  Therapy/Group: Individual Therapy  Nakia Remmers P Joriel Streety PT 01/02/2021, 10:20 AM

## 2021-01-02 NOTE — Progress Notes (Signed)
Physical Therapy Session Note  Patient Details  Name: [name] MRN: [id number] Date of Birth: [birth year]  Today's Date: 01/02/2021 PT Individual Time: 1445-1530 PT Individual Time Calculation (min): 45 min   Short Term Goals: Week 1:  PT Short Term Goal 1 (Week 1): Patient will transfer bed <> wc with supervision PT Short Term Goal 2 (Week 1): Patient will ambulate at least 171ft with LRAD and CGA PT Short Term Goal 3 (Week 1): Patient with negotiate at least 3 steps with LRAD and CGA  Skilled Therapeutic Interventions/Progress Updates:     Patient in bed upon PT arrival. Patient alert and agreeable to PT session. Patient denied pain during session. Reported increased fatigue from lack of sleep the night before and from previous therapy sessions. Asked to focus on her R upper extremity this session due to lower extremity fatigue.  Neuromuscular Re-ed: Patient performed the following upper extremity motor control activities 2x5-10 reps: -R elbow flexion/extension with PNF tapping with AAROM <50% assist after 90 deg without assist -R elbow flexion/extension with support in 90 deg abduction for gravity eliminated position without assist through full range (outside of support for abduction), progressed to against resistance -R grip strength bilaterally for overflow -R shoulder flexion to ~140 deg with facilitation for scapular motion -R shoulder extension <10 deg without compensation  Educated on causes of stroke, signs and symptoms of stroke, and BP management and monitoring during session. Patient reports feeling ready for d/c and has no concerns on mobility and home access. Discussed AFO fitting and shoe recommendations reviewed earlier with previous PT and CPO.   Patient in bed at end of session with breaks locked, bed alarm set, and all needs within reach.    Therapy Documentation Precautions:  Precautions Precautions: Fall Precaution Comments: R hemi Restrictions Weight  Bearing Restrictions: No   Therapy/Group: Individual Therapy  Aylissa Heinemann L Bertel Venard PT, DPT  01/02/2021, 4:46 PM

## 2021-01-03 NOTE — Progress Notes (Addendum)
[facility] PHYSICAL MEDICINE & REHABILITATION PROGRESS NOTE   Subjective/Complaints: Patient seen sitting up in bed this morning.  She states she slept well overnight.  She demonstrates that she is not able to make a fist.  Denies recurrent migraines.  ROS: Denies CP, SOB, N/V/D  Objective:   No results found. No results for input(s): WBC, HGB, HCT, PLT in the last 72 hours. No results for input(s): NA, K, CL, CO2, GLUCOSE, BUN, CREATININE, CALCIUM in the last 72 hours.  Intake/Output Summary (Last 24 hours) at 01/03/2021 1430 Last data filed at 01/03/2021 0723 Gross per 24 hour  Intake 656 ml  Output --  Net 656 ml        Physical Exam: Vital Signs Blood pressure 127/64, pulse 69, temperature 98.2 F (36.8 C), resp. rate 18, height 5\' 4"  (1.626 m), weight 80.5 kg, SpO2 99 %. Constitutional: No distress . Vital signs reviewed. HENT: Normocephalic.  Atraumatic. Eyes: EOMI. No discharge. Cardiovascular: No JVD.  RRR. Respiratory: Normal effort.  No stridor.  Bilateral clear to auscultation. GI: Non-distended.  BS +. Skin: Warm and dry.  Intact. Psych: Normal mood.  Normal behavior. Musc: Right shoulder pain improved No edema Neurologic: Alert Motor: LUE/LE: 5/5 proximal distal RUE: Shoulder abduction, elbow flexion/extension 4-/5, wrist extension, handgrip 4-/5, continues to improve Right lower extremity: Hip flexion 4+/5, knee extension 4+/5, ankle dorsiflexion 4/5, improving  Assessment/Plan: 1. Functional deficits which require 3+ hours per day of interdisciplinary therapy in a comprehensive inpatient rehab setting.  Physiatrist is providing close team supervision and 24 hour management of active medical problems listed below.  Physiatrist and rehab team continue to assess barriers to discharge/monitor patient progress toward functional and medical goals  Care Tool:  Bathing    Body parts bathed by patient: Right arm,Chest,Abdomen,Front perineal  area,Buttocks,Right upper leg,Left upper leg,Right lower leg,Left lower leg,Face,Left arm   Body parts bathed by helper: Left arm     Bathing assist Assist Level: Supervision/Verbal cueing     Upper Body Dressing/Undressing Upper body dressing   What is the patient wearing?: Pull over shirt,Bra    Upper body assist Assist Level: Minimal Assistance - Patient > 75%    Lower Body Dressing/Undressing Lower body dressing      What is the patient wearing?: Underwear/pull up,Pants     Lower body assist Assist for lower body dressing: Supervision/Verbal cueing     Toileting Toileting Toileting Activity did not occur (Clothing management and hygiene only): N/A (no void or bm)  Toileting assist Assist for toileting: Independent with assistive device     Transfers Chair/bed transfer  Transfers assist     Chair/bed transfer assist level: Independent with assistive device Chair/bed transfer assistive device: assist      Assist level: Supervision/Verbal cueing Assistive device: Walker-rolling Max distance: 157ft   Walk 10 feet activity   Assist     Assist level: Supervision/Verbal cueing Assistive device: Walker-rolling   Walk 50 feet activity   Assist    Assist level: Supervision/Verbal cueing Assistive device: Walker-rolling    Walk 150 feet activity   Assist Walk 150 feet activity did not occur: Safety/medical concerns  Assist level: Supervision/Verbal cueing Assistive device: Walker-rolling    Walk 10 feet on uneven surface  activity   Assist Walk 10 feet on uneven surfaces activity did not occur: Safety/medical concerns   Assist level: Supervision/Verbal cueing Assistive device: 72f Will patient  use wheelchair at discharge?: No             Wheelchair 50 feet with 2 turns activity    Assist            Wheelchair 150 feet activity      Assist          Medical Problem List and Plan: 1.Functional deficits and 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 Antithrombotics: -DVT/anticoagulation:Pharmaceutical:Lovenox -antiplatelet therapy: DAPT 3. Pain Management/ pre-morbid right RTC injury:tylenol for mild pain, tramadol for more severe pain -add sports cream bid DC'd -support right upper ext in bed/chair -ROM exercises with PT/OT  Voltaren gel ordered on 2/1, continues to improved 4. Mood:LCSW to follow for evaluation and support. -antipsychotic agents: N/a 5. Neuropsych: This patientiscapable of making decisions on herown behalf. 6. Skin/Wound Care:Routine pressure relief measures.  BMP within acceptable range on 1/31 7. Fluids/Electrolytes/Nutrition:Monitor I/Os.  8. Blood pressures: Higher BP goal recommended and toavoid hypotension.  Vitals:   01/02/21 2056 01/03/21 0410  BP: 124/61 127/64  Pulse: 71 69  Resp: 15 18  Temp: 98.8 F (37.1 C) 98.2 F (36.8 C)  SpO2: 95% 99%   Relatively controlled on 2/5 9. H/o Migraines: Topamax.  Controlled on 2/5 10. 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  Controlled on 2/5  Monitor with increased exertion 11. H/o depression: Stable on current dose Celexa. 12. Slow transit constipation  Bowel meds increased on 2/2  Improved   LOS: 8 days A FACE TO FACE EVALUATION WAS PERFORMED  Mera Gunkel Karis Juba 01/03/2021, 2:30 PM

## 2021-01-03 NOTE — Progress Notes (Signed)
Physical Therapy Session Note  Patient Details  Name: [name] MRN: [id number] Date of Birth: 10-24-1961  Today's Date: 01/03/2021 PT Individual Time: 0800-0900 PT Individual Time Calculation (min): 60 min   Short Term Goals: Week 1:  PT Short Term Goal 1 (Week 1): Patient will transfer bed <> wc with supervision PT Short Term Goal 2 (Week 1): Patient will ambulate at least 183ft with LRAD and CGA PT Short Term Goal 3 (Week 1): Patient with negotiate at least 3 steps with LRAD and CGA  Skilled Therapeutic Interventions/Progress Updates:    Pt received seated in bed, agreeable to PT session. No complaints of pain. Supine in bed to sitting EOB at mod I level with use of bedrail. Assisted pt with donning RLE DF assist ACE wrap while sitting EOB, pt still awaiting new shoes to be delivered by husband to trial AFO. Sit to stand with Supervision to RW throughout session. Ambulation 2 x 150 ft with RW at Supervision level, occasional catching of R toe during gait. Ascend/descend 12 x 6" stairs with one handrail and close Supervision, step-to gait pattern. Standing RLE 3" step-ups with B handrails and CGA for balance with focus on decreased circumduction of limb as well as decreasing knee hyperextension in stance. Standing RLE yoga block level 1 taps 2 x 15 reps to fatigue with RW and CGA for balance with focus on hip flexor activation and decreased circumduction. Standing RLE HS curls 2 x 10 reps with LLE on 4" curb step, decreased ability to flex HS against gravity and muscles fatigue quickly. Standing toe raises 2 x 15 reps to fatigue. Pt left seated in bed with needs in reach, bed alarm in place at end of session.  Therapy Documentation Precautions:  Precautions Precautions: Fall Precaution Comments: R hemi Restrictions Weight Bearing Restrictions: No   Therapy/Group: Individual Therapy   Peter Congo, PT, DPT  01/03/2021, 4:12 PM

## 2021-01-04 MED ORDER — POLYETHYLENE GLYCOL 3350 17 G PO PACK
17.0000 g | PACK | Freq: Two times a day (BID) | ORAL | Status: DC
  Administered 2021-01-05: 17 g via ORAL
  Filled 2021-01-04 (×2): qty 1

## 2021-01-04 NOTE — Progress Notes (Signed)
At 1949, PRN ultram given for C/O right shoulder pain. Slept good after med given. Moderate hard stool, "my bowels still feel sluggish." BS hypoactive. Calls for assistance.Alfredo Martinez A

## 2021-01-04 NOTE — Progress Notes (Signed)
Physical Therapy Session Note  Patient Details  Name: [name] MRN: [id number] Date of Birth: January 23, 1961  Today's Date: 01/04/2021 PT Individual Time: 1610-1705 PT Individual Time Calculation (min): 55 min   Short Term Goals: Week 1:  PT Short Term Goal 1 (Week 1): Patient will transfer bed <> wc with supervision PT Short Term Goal 2 (Week 1): Patient will ambulate at least 132ft with LRAD and CGA PT Short Term Goal 3 (Week 1): Patient with negotiate at least 3 steps with LRAD and CGA  Skilled Therapeutic Interventions/Progress Updates:    Pt received seated EOB, agreeable to PT session. No complaints of pain. Pt performs all transfers with RW at mod I level throughout session. Pt reports frustration with AFO she received and does not believe it assists in similar manner to ACE wrap she has been donning. Discussed differences between use of ACE wrap vs AFO. Session focus on trial gait with GRAFO that pt received vs PLS. With use of GRAFO pt exhibits decreased ability to perform knee flexion and brace forces her into hyperextension at times. Pt also exhibits decreased ability to clear foot during gait with use of GRAFO vs PLS. Message sent to primary PT regarding pt's AFO. Also encouraged pt to purchase "running" shoes vs flat-footed [place] shoes she has brought in to allow more room for her foot and AFO in shoe. Patient demonstrates increased fall risk as noted by score of  53/56 on Berg Balance Scale.  (<36= high risk for falls, close to 100%; 37-45 significant >80%; 46-51 moderate >50%; 52-55 lower >25%). Pt exhibits improved score vs 39/56 on 12/29/20. Pt left seated EOB in room with needs in reach, bed alarm in place at end of session.  Therapy Documentation Precautions:  Precautions Precautions: Fall Precaution Comments: R hemi Restrictions Weight Bearing Restrictions: No    Therapy/Group: Individual Therapy   Peter Congo, PT, DPT  01/04/2021, 6:13 PM

## 2021-01-04 NOTE — Progress Notes (Signed)
Occupational Therapy Session Note  Patient Details  Name: [name] MRN: [id number] Date of Birth: [birth year]  Today's Date: 01/04/2021 OT Individual Time: 1002-1101 OT Individual Time Calculation (min): 59 min   Short Term Goals: Week 1:  OT Short Term Goal 1 (Week 1): Pt will donn a pullover shirt with supervision for two consecutive attempts. OT Short Term Goal 2 (Week 1): Pt will complete LB dressing with no more than min assist for two consecutive attempts. OT Short Term Goal 3 (Week 1): Pt will use the RUE as a gross assist during selfcare tasks with mod facilitation. OT Short Term Goal 4 (Week 1): Pt will complete walk-in shower transfer with min guard assist using the RW for support.    Skilled Therapeutic Interventions/Progress Updates:    Pt greeted in bed and agreeable to session, declining shower, opting to instead start session by using the restroom. Ambulatory transfer to toilet completed using RW with supervision assist, min cues for strapping her Rt hand into the orthotic component. Supervision for toileting tasks with pt having continent bladder void. After handwashing at the sink, she returned to EOB to spot wash UB and also engage in UB/LB dressing tasks, supervision for sit<stand. Pt with increased finger movements in the Rt hand and OT encouraged her to use affected limb more actively during ADL tasks today. Oral care was then completed while standing at the sink, pt incorporating B UEs. Once she returned to EOB, we discussed her multiple HEPs for home provided by primary OT. This OT printed her a therapeutic activity sheet and we reviewed more NMR activities together, also provided her with a [facility] exercise program for the Rt hand, discussed and demonstrated ways to upgrade activity. At end of session she completed stand pivot<w/c with supervision assist. Left her in the w/c with all needs within reach and half lap tray, still working on her [facility] HEP for the Rt hand.   Therapy  Documentation Precautions:  Precautions Precautions: Fall Precaution Comments: R hemi Restrictions Weight Bearing Restrictions: No Pain: Rt shoulder pain that had absolved after taking tylenol this morning   ADL: ADL Eating: Set up Where Assessed-Eating: Chair Grooming: Setup Where Assessed-Grooming: Standing at sink Upper Body Bathing: Modified independent Where Assessed-Upper Body Bathing: Shower Lower Body Bathing: Modified independent Where Assessed-Lower Body Bathing: Shower Upper Body Dressing: Minimal assistance Where Assessed-Upper Body Dressing: Chair Lower Body Dressing: Supervision/safety Where Assessed-Lower Body Dressing: Chair Toileting: Modified independent Where Assessed-Toileting: Teacher, adult education: Close supervision Statistician Method: Proofreader: Raised toilet seat,Grab bars Tub/Shower Transfer: Not assessed Film/video editor: Close supervision Film/video editor Method: Ambulating      Therapy/Group: Individual Therapy  Trampas Stettner A Zakaria Sedor 01/04/2021, 12:48 PM

## 2021-01-04 NOTE — Plan of Care (Signed)
  Problem: RH Eating Goal: LTG Patient will perform eating w/assist, cues/equip (OT) Description: LTG: Patient will perform eating with assist, with/without cues using equipment (OT) Outcome: Not Met (add Reason) Note: Due to pt needing setup assist at this time   Problem: RH Dressing Goal: LTG Patient will perform upper body dressing (OT) Description: LTG Patient will perform upper body dressing with assist, with/without cues (OT). Outcome: Not Met (add Reason) Note: Pt occasionally needing Min A if clothing is tight and therefore goal unable to be met   Problem: RH Balance Goal: LTG: Patient will maintain dynamic sitting balance (OT) Description: LTG:  Patient will maintain dynamic sitting balance with assistance during activities of daily living (OT) Outcome: Completed/Met Goal: LTG Patient will maintain dynamic standing with ADLs (OT) Description: LTG:  Patient will maintain dynamic standing balance with assist during activities of daily living (OT)  Outcome: Completed/Met   Problem: Sit to Stand Goal: LTG:  Patient will perform sit to stand in prep for activites of daily living with assistance level (OT) Description: LTG:  Patient will perform sit to stand in prep for activites of daily living with assistance level (OT) Outcome: Completed/Met   Problem: RH Grooming Goal: LTG Patient will perform grooming w/assist,cues/equip (OT) Description: LTG: Patient will perform grooming with assist, with/without cues using equipment (OT) Outcome: Completed/Met   Problem: RH Bathing Goal: LTG Patient will bathe all body parts with assist levels (OT) Description: LTG: Patient will bathe all body parts with assist levels (OT) Outcome: Completed/Met   Problem: RH Dressing Goal: LTG Patient will perform lower body dressing w/assist (OT) Description: LTG: Patient will perform lower body dressing with assist, with/without cues in positioning using equipment (OT) Outcome: Completed/Met    Problem: RH Toileting Goal: LTG Patient will perform toileting task (3/3 steps) with assistance level (OT) Description: LTG: Patient will perform toileting task (3/3 steps) with assistance level (OT)  Outcome: Completed/Met   Problem: RH Functional Use of Upper Extremity Goal: LTG Patient will use RT/LT upper extremity as a (OT) Description: LTG: Patient will use right/left upper extremity as a stabilizer/gross assist/diminished/nondominant/dominant level with assist, with/without cues during functional activity (OT) Outcome: Completed/Met   Problem: RH Simple Meal Prep Goal: LTG Patient will perform simple meal prep w/assist (OT) Description: LTG: Patient will perform simple meal prep with assistance, with/without cues (OT). Outcome: Completed/Met   Problem: RH Toilet Transfers Goal: LTG Patient will perform toilet transfers w/assist (OT) Description: LTG: Patient will perform toilet transfers with assist, with/without cues using equipment (OT) Outcome: Completed/Met   Problem: RH Tub/Shower Transfers Goal: LTG Patient will perform tub/shower transfers w/assist (OT) Description: LTG: Patient will perform tub/shower transfers with assist, with/without cues using equipment (OT) Outcome: Completed/Met

## 2021-01-04 NOTE — Progress Notes (Signed)
[facility] PHYSICAL MEDICINE & REHABILITATION PROGRESS NOTE   Subjective/Complaints: Patient seen sitting up in bed this morning. She states she slept well overnight. She notes improvement in pulmonary coordination right fingers.  ROS: Denies CP, SOB, N/V/D  Objective:   No results found. No results for input(s): WBC, HGB, HCT, PLT in the last 72 hours. No results for input(s): NA, K, CL, CO2, GLUCOSE, BUN, CREATININE, CALCIUM in the last 72 hours.  Intake/Output Summary (Last 24 hours) at 01/04/2021 0918 Last data filed at 01/03/2021 2300 Gross per 24 hour  Intake 720 ml  Output --  Net 720 ml        Physical Exam: Vital Signs Blood pressure (!) 115/59, pulse 65, temperature 98.2 F (36.8 C), resp. rate 14, height 5\' 4"  (1.626 m), weight 80.5 kg, SpO2 97 %. Constitutional: No distress . Vital signs reviewed. HENT: Normocephalic.  Atraumatic. Eyes: EOMI. No discharge. Cardiovascular: No JVD.  RRR. Respiratory: Normal effort.  No stridor.  Bilateral clear to auscultation. GI: Non-distended.  BS +. Skin: Warm and dry.  Intact. Psych: Normal mood.  Normal behavior. Musc: Right shoulder tenderness improved No edema Neurologic: Alert Motor: LUE/LE: 5/5 proximal distal RUE: Shoulder abduction, elbow flexion/extension 4/5, wrist extension, handgrip 445, continues to improve Right lower extremity: Hip flexion 4+/5, knee extension 4+/5, ankle dorsiflexion 4+/5, improving  Assessment/Plan: 1. Functional deficits which require 3+ hours per day of interdisciplinary therapy in a comprehensive inpatient rehab setting.  Physiatrist is providing close team supervision and 24 hour management of active medical problems listed below.  Physiatrist and rehab team continue to assess barriers to discharge/monitor patient progress toward functional and medical goals  Care Tool:  Bathing    Body parts bathed by patient: Right arm,Chest,Abdomen,Front perineal area,Buttocks,Right upper  leg,Left upper leg,Right lower leg,Left lower leg,Face,Left arm   Body parts bathed by helper: Left arm     Bathing assist Assist Level: Supervision/Verbal cueing     Upper Body Dressing/Undressing Upper body dressing   What is the patient wearing?: Pull over shirt,Bra    Upper body assist Assist Level: Minimal Assistance - Patient > 75%    Lower Body Dressing/Undressing Lower body dressing      What is the patient wearing?: Underwear/pull up,Pants     Lower body assist Assist for lower body dressing: Supervision/Verbal cueing     Toileting Toileting Toileting Activity did not occur (Clothing management and hygiene only): N/A (no void or bm)  Toileting assist Assist for toileting: Independent with assistive device     Transfers Chair/bed transfer  Transfers assist     Chair/bed transfer assist level: Supervision/Verbal cueing Chair/bed transfer assistive device:   Ambulation assist      Assist level: Supervision/Verbal cueing Assistive device: Walker-rolling Max distance: 150'   Walk 10 feet activity   Assist     Assist level: Supervision/Verbal cueing Assistive device: Walker-rolling   Walk 50 feet activity   Assist    Assist level: Supervision/Verbal cueing Assistive device: Walker-rolling    Walk 150 feet activity   Assist Walk 150 feet activity did not occur: Safety/medical concerns  Assist level: Supervision/Verbal cueing Assistive device: Walker-rolling    Walk 10 feet on uneven surface  activity   Assist Walk 10 feet on uneven surfaces activity did not occur: Safety/medical concerns   Assist level: Supervision/Verbal cueing Assistive device: Geologist, engineering Will patient use wheelchair at discharge?: No  Wheelchair 50 feet with 2 turns activity    Assist            Wheelchair 150 feet activity     Assist          Medical  Problem List and Plan: 1.Functional deficits and 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 Antithrombotics: -DVT/anticoagulation:Pharmaceutical:Lovenox -antiplatelet therapy: DAPT 3. Pain Management/ pre-morbid right RTC injury:tylenol for mild pain, tramadol for more severe pain -add sports cream bid DC'd -support right upper ext in bed/chair -ROM exercises with PT/OT  Voltaren gel ordered on 2/1, improved 4. Mood:LCSW to follow for evaluation and support. -antipsychotic agents: N/a 5. Neuropsych: This patientiscapable of making decisions on herown behalf. 6. Skin/Wound Care:Routine pressure relief measures.  BMP within acceptable range on 1/31 7. Fluids/Electrolytes/Nutrition:Monitor I/Os.  8. Blood pressures: Higher BP goal recommended and toavoid hypotension.  Vitals:   01/03/21 2015 01/04/21 0447  BP: (!) 111/50 (!) 115/59  Pulse: 68 65  Resp: 14 14  Temp: 98.3 F (36.8 C) 98.2 F (36.8 C)  SpO2: 97% 97%   Controlled on 2/6 9. H/o Migraines: Topamax.  Controlled on 2/6 10. 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  Controlled on 2/6  Monitor with increased exertion 11. H/o depression: Stable on current dose Celexa. 12. Slow transit constipation  Bowel meds increased on 2/2, increased again on 2/6  Improved   LOS: 9 days A FACE TO FACE EVALUATION WAS PERFORMED  Toron Bowring Karis Juba 01/04/2021, 9:18 AM

## 2021-01-05 LAB — BASIC METABOLIC PANEL
Anion gap: 8 (ref 5–15)
BUN: 18 mg/dL (ref 6–20)
CO2: 27 mmol/L (ref 22–32)
Calcium: 8.9 mg/dL (ref 8.9–10.3)
Chloride: 106 mmol/L (ref 98–111)
Creatinine, Ser: 0.89 mg/dL (ref 0.44–1.00)
GFR, Estimated: >60 mL/min (ref >60)
Glucose, Bld: 100 mg/dL — ABNORMAL HIGH (ref 70–99)
Potassium: 4 mmol/L (ref 3.5–5.1)
Sodium: 141 mmol/L (ref 135–145)

## 2021-01-05 LAB — CBC
HCT: 41.6 % (ref 36.0–46.0)
Hemoglobin: 13.6 g/dL (ref 12.0–15.0)
MCH: 30.3 pg (ref 26.0–34.0)
MCHC: 32.7 g/dL (ref 30.0–36.0)
MCV: 92.7 fL (ref 80.0–100.0)
Platelets: 277 10*3/uL (ref 150–400)
RBC: 4.49 MIL/uL (ref 3.87–5.11)
RDW: 13 % (ref 11.5–15.5)
WBC: 6.4 10*3/uL (ref 4.0–10.5)
nRBC: 0 % (ref 0.0–0.2)

## 2021-01-05 MED ORDER — TRAMADOL HCL 50 MG PO TABS: 50.0000 mg | ORAL_TABLET | Freq: Four times a day (QID) | ORAL | 0 refills | Status: DC | PRN

## 2021-01-05 MED ORDER — DICLOFENAC SODIUM 1 % EX GEL: 2.0000 g | Freq: Four times a day (QID) | CUTANEOUS | 0 refills | Status: DC

## 2021-01-05 MED ORDER — ATORVASTATIN CALCIUM 80 MG PO TABS: 80.0000 mg | ORAL_TABLET | Freq: Every day | ORAL | 0 refills | Status: DC

## 2021-01-05 MED ORDER — CLOPIDOGREL BISULFATE 75 MG PO TABS: 75.0000 mg | ORAL_TABLET | Freq: Every day | ORAL | 0 refills | Status: DC

## 2021-01-05 MED ORDER — POLYETHYLENE GLYCOL 3350 17 G PO PACK: 17.0000 g | PACK | Freq: Two times a day (BID) | ORAL | 0 refills | Status: DC

## 2021-01-05 NOTE — Discharge Instructions (Signed)
Inpatient Rehab Discharge Instructions  [name] Discharge date and time: 01/03/21   Activities/Precautions/ Functional Status: Activity: no lifting, driving, or strenuous exercise till cleared by MD Diet: cardiac diet Wound Care: none needed    Functional status:  ___ No restrictions     ___ Walk up steps independently _X__ 24/7 supervision/assistance   ___ Walk up steps with assistance ___ Intermittent supervision/assistance  ___ Bathe/dress independently ___ Walk with walker     _X__ Bathe/dress with assistance ___ Walk Independently    ___ Shower independently ___ Walk with assistance    ___ Shower with assistance _X__ No alcohol     ___ Return to work/school ________   Special Instructions:  COMMUNITY REFERRALS UPON DISCHARGE:    Outpatient: PT     OT                Agency: [facility]  Phone: [phone]             Appointment Date/Time: Facility to Determine at Discharge   Medical Equipment/Items Ordered: Agricultural consultant (Declined)                                                 Agency/Supplier: [employer]      STROKE/TIA DISCHARGE INSTRUCTIONS SMOKING Cigarette smoking nearly doubles your risk of having a stroke & is the single most alterable risk factor  If you smoke or have smoked in the last 12 months, you are advised to quit smoking for your health.  Most of the excess cardiovascular risk related to smoking disappears within a year of stopping.  Ask you doctor about anti-smoking medications  [place] Quit Line: 1-800-QUIT NOW  Free Smoking Cessation Classes [phone]  CHOLESTEROL Know your levels; limit fat & cholesterol in your diet  Lipid Panel     Component Value Date/Time   CHOL 168 12/24/2020 0412   CHOL 245 (H) 09/17/2020 0915   TRIG 70 12/24/2020 0412   HDL 68 12/24/2020 0412   HDL 70 09/17/2020 0915   CHOLHDL 2.5 12/24/2020 0412   VLDL 14 12/24/2020 0412   LDLCALC 86  12/24/2020 0412   LDLCALC 161 (H) 09/17/2020 0915      Many patients benefit from treatment even if their cholesterol is at goal.  Goal: Total Cholesterol (CHOL) less than 160  Goal:  Triglycerides (TRIG) less than 150  Goal:  HDL greater than 40  Goal:  LDL (LDLCALC) less than 100   BLOOD PRESSURE American Stroke Association blood pressure target is less that 120/80 mm/Hg  Your discharge blood pressure is:  BP: 133/66  Monitor your blood pressure  Limit your salt and alcohol intake  Many individuals will require more than one medication for high blood pressure  DIABETES (A1c is a blood sugar average for last 3 months) Goal HGBA1c is under 7% (HBGA1c is blood sugar average for last 3 months)  Diabetes: No known diagnosis of diabetes    Lab Results  Component Value Date   HGBA1C 5.6 12/24/2020     Your HGBA1c can be lowered with medications, healthy diet, and exercise.  Check your blood sugar as directed by your physician  Call your physician if you experience unexplained or low blood sugars.  PHYSICAL ACTIVITY/REHABILITATION Goal is 30 minutes at least 4 days per week  Activity: No driving,  Therapies: see above Return to work: N/A  Activity decreases your risk of heart attack and stroke and makes your heart stronger.  It helps control your weight and blood pressure; helps you relax and can improve your mood.  Participate in a regular exercise program.  Talk with your doctor about the best form of exercise for you (dancing, walking, swimming, cycling).  DIET/WEIGHT Goal is to maintain a healthy weight  Your discharge diet is:  Diet Order            Diet 2 gram sodium Room service appropriate? Yes; Fluid consistency: Thin  Diet effective now               liquids Your height is:  Height: 5\' 4"  (162.6 cm) Your current weight is: Weight: 80.5 kg Your Body Mass Index (BMI) is:  BMI (Calculated): 30.45  Following the type of diet specifically designed for you will  help prevent another stroke.  Your goal weight is  Your goal Body Mass Index (BMI) is 19-24.  Healthy food habits can help reduce 3 risk factors for stroke:  High cholesterol, hypertension, and excess weight.  RESOURCES Stroke/Support Group:  Call [phone]   STROKE EDUCATION PROVIDED/REVIEWED AND GIVEN TO PATIENT Stroke warning signs and symptoms How to activate emergency medical system (call 911). Medications prescribed at discharge. Need for follow-up after discharge. Personal risk factors for stroke. Pneumonia vaccine given:  Flu vaccine given:  My questions have been answered, the writing is legible, and I understand these instructions.  I will adhere to these goals & educational materials that have been provided to me after my discharge from the hospital.    My questions have been answered and I understand these instructions. I will adhere to these goals and the provided educational materials after my discharge from the hospital.  Patient/Caregiver Signature _______________________________ Date __________  Clinician Signature _______________________________________ Date __________  Please bring this form and your medication list with you to all your follow-up doctor's appointments.

## 2021-01-05 NOTE — Progress Notes (Signed)
Inpatient Rehabilitation Care Coordinator Discharge Note  The overall goal for the admission was met for:   Discharge location: Yes, home  Length of Stay: Yes. 10 days   Discharge activity level: Yes  Home/community participation: Yes  Services provided included: MD, RD, PT, OT, SLP, RN, CM, TR, Pharmacy, Neuropsych and SW  Financial Services: Other: uninsured  Choices offered to/list presented HQ:[id number]   Follow-up services arranged: Outpatient: Outpatient (Private Pay Request)  Comments (or additional information):[facility]  (PT)  Patient/Family verbalized understanding of follow-up arrangements: Yes  Individual responsible for coordination of the follow-up plan: [phone] (spouse)  Confirmed correct DME delivered: Dyanne Iha 01/05/2021    Dyanne Iha

## 2021-01-05 NOTE — Progress Notes (Signed)
Patient ID: [name], female   DOB: [birth year], 61 y.o.   MRN: [id number]   Patient referral faxed to [facility]   [place], [place] [phone]

## 2021-01-05 NOTE — Progress Notes (Signed)
Patient d/c home with husband, A&O at the time of d/c. Complained of back pain, PRN Tylenol given and was effective. Patient transported to lobby by nursing staffs. We continue to monitor.

## 2021-01-05 NOTE — Progress Notes (Signed)
PHYSICAL MEDICINE & REHABILITATION PROGRESS NOTE   Pt up at sink, washing. Feeling well. Very excited to be going home today!  ROS: Patient denies fever, rash, sore throat, blurred vision, nausea, vomiting, diarrhea, cough, shortness of breath or chest pain, joint or back pain, headache, or mood change.    Objective:   No results found. Recent Labs    01/05/21 0626  WBC 6.4  HGB 13.6  HCT 41.6  PLT 277   Recent Labs    01/05/21 0626  NA 141  K 4.0  CL 106  CO2 27  GLUCOSE 100*  BUN 18  CREATININE 0.89  CALCIUM 8.9    Intake/Output Summary (Last 24 hours) at 01/05/2021 0920 Last data filed at 01/05/2021 0830 Gross per 24 hour  Intake 960 ml  Output --  Net 960 ml        Physical Exam: Vital Signs Blood pressure 117/66, pulse 66, temperature 98.1 F (36.7 C), temperature source Oral, resp. rate 14, height 5\' 4"  (1.626 m), weight 80.5 kg, SpO2 94 %. Constitutional: No distress . Vital signs reviewed. HEENT: EOMI, oral membranes moist Neck: supple Cardiovascular: RRR without murmur. No JVD    Respiratory/Chest: CTA Bilaterally without wheezes or rales. Normal effort    GI/Abdomen: BS +, non-tender, non-distended Ext: no clubbing, cyanosis, or edema Psych: pleasant and cooperative Musc: Right shoulder tenderness improved No edema Neurologic: Alert Motor: LUE/LE: 5/5 proximal distal RUE: Shoulder abduction, elbow flexion/extension 4/5, wrist extension, handgrip 4/5, decreased FMC of hand Right lower extremity: Hip flexion 4+/5, knee extension 4+/5, ankle dorsiflexion 4+/5. Good standing balance and gait.  Assessment/Plan: 1. Functional deficits which require 3+ hours per day of interdisciplinary therapy in a comprehensive inpatient rehab setting.  Physiatrist is providing close team supervision and 24 hour management of active medical problems listed below.  Physiatrist and rehab team continue to assess barriers to discharge/monitor patient  progress toward functional and medical goals  Care Tool:  Bathing    Body parts bathed by patient: Right arm,Chest,Abdomen,Front perineal area,Buttocks,Right upper leg,Left upper leg,Right lower leg,Left lower leg,Face,Left arm   Body parts bathed by helper: Left arm     Bathing assist Assist Level: Supervision/Verbal cueing (per most recent staff documentation)     Upper Body Dressing/Undressing Upper body dressing   What is the patient wearing?: Pull over shirt,Bra    Upper body assist Assist Level: Set up assist    Lower Body Dressing/Undressing Lower body dressing      What is the patient wearing?: Underwear/pull up,Pants     Lower body assist Assist for lower body dressing: Supervision/Verbal cueing     Toileting Toileting Toileting Activity did not occur (Clothing management and hygiene only): N/A (no void or bm)  Toileting assist Assist for toileting: Supervision/Verbal cueing     Transfers Chair/bed transfer  Transfers assist     Chair/bed transfer assist level: Independent with assistive device Chair/bed transfer assistive device: assist      Assist level: Supervision/Verbal cueing Assistive device: Walker-rolling Max distance: 150'   Walk 10 feet activity   Assist     Assist level: Supervision/Verbal cueing Assistive device: Walker-rolling   Walk 50 feet activity   Assist    Assist level: Supervision/Verbal cueing Assistive device: Walker-rolling    Walk 150 feet activity   Assist Walk 150 feet activity did not occur: Safety/medical concerns  Assist level: Supervision/Verbal cueing Assistive device: Walker-rolling    Walk 10  feet on uneven surface  activity   Assist Walk 10 feet on uneven surfaces activity did not occur: Safety/medical concerns   Assist level: Supervision/Verbal cueing Assistive device: Photographer Will patient use  wheelchair at discharge?: No             Wheelchair 50 feet with 2 turns activity    Assist            Wheelchair 150 feet activity     Assist          Medical Problem List and Plan: 1.Functional deficits and 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 Antithrombotics: -DVT/anticoagulation:Pharmaceutical:Lovenox -antiplatelet therapy: DAPT 3. Pain Management/ pre-morbid right RTC injury:tylenol for mild pain, tramadol for more severe pain -add sports cream bid DC'd -support right upper ext in bed/chair -ROM exercises with PT/OT  Voltaren gel ordered on 2/1, improved 4. Mood:LCSW to follow for evaluation and support. -antipsychotic agents: N/a 5. Neuropsych: This patientiscapable of making decisions on herown behalf. 6. Skin/Wound Care:Routine pressure relief measures.  BMP within acceptable range on 1/31 7. Fluids/Electrolytes/Nutrition:Monitor I/Os.  8. Blood pressures: Higher BP goal recommended and toavoid hypotension.  Vitals:   01/04/21 2032 01/05/21 0359  BP: 117/64 117/66  Pulse: 73 66  Resp: 18 14  Temp: 98.2 F (36.8 C) 98.1 F (36.7 C)  SpO2: 94% 94%   Controlled on 2/7 9. H/o Migraines: Topamax.  Controlled on 2/7 10. 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  Controlled on 2/7  Monitor with increased exertion 11. H/o depression: Stable on current dose Celexa. 12. Slow transit constipation  Bowel meds increased on 2/2, increased again on 2/6  Improved   LOS: 10 days A FACE TO FACE EVALUATION WAS PERFORMED  Ranelle Oyster 01/05/2021, 9:20 AM

## 2021-01-05 NOTE — Discharge Summary (Signed)
Physician Discharge Summary  Patient ID: [name] MRN: [id number] DOB/AGE: 1961/04/26 60 y.o.  Admit date: 12/26/2020 Discharge date: 01/05/2021  Discharge Diagnoses:  Principal Problem:   Left pontine stroke [facility]) Active Problems:   Strain of right rotator cuff capsule   Migraine without status migrainosus, not intractable   Uncomplicated asthma   Right hemiparesis (HCC)   Slow transit constipation   Discharged Condition: stable   Significant Diagnostic Studies: N/a   Labs:  Basic Metabolic Panel: BMP Latest Ref Rng & Units 01/05/2021 12/29/2020 12/27/2020  Glucose 70 - 99 mg/dL 035(K) 093(G) 182(X)  BUN 6 - 20 mg/dL 18 19 14   Creatinine 0.44 - 1.00 mg/dL 9.37 1.69  BUN/Creat Ratio 12 - 28 - - -  Sodium 135 - 145 mmol/L 141 141 139  Potassium 3.5 - 5.1 mmol/L 4.0 3.5 3.8  Chloride 98 - 111 mmol/L 106 106 106  CO2 22 - 32 mmol/L 27 28 26   Calcium 8.9 - 10.3 mg/dL 8.9 6.78) )    CBC: CBC Latest Ref Rng & Units 01/05/2021 12/29/2020 12/27/2020  WBC 4.0 - 10.5 K/uL 6.4 7.0 8.0  Hemoglobin 12.0 - 15.0 g/dL 12/31/2020 12/29/2020 75.1  Hematocrit 36.0 - 46.0 % 41.6 40.3 40.1  Platelets 150 - 400 K/uL 277 294 264    CBG: No results for input(s): GLUCAP in the last 168 hours.  Brief HPI:   [name] is a 60 y.o. female with history of migraines, vitamin D deficiency who was originally diagnosed with left pontine infarct on 12/19/2020.  She was readmitted on 12/23/2020 with slurred speech and worsening of right-sided weakness.  MRI brain done revealing acute/subacute infarct left paramedian pons that had increased in size and small chronic left cerebellar infarct.  CTA head/neck was negative for hemorrhage and no LVO noted therefore not TPA candidate.  Stroke was felt to be due to small vessel disease and neurology recommended DAPT x3 weeks followed by aspirin alone as well as CIR as mainstay of treatment.  Therapy evaluations done revealing right-sided weakness with deficits  in balance as well as decreased ability to carry out ADLs.  CIR was recommended due to functional decline.   Hospital Course: [name] was admitted to rehab 12/26/2020 for inpatient therapies to consist of PT and OT at least three hours five days a week. Past admission physiatrist, therapy team and rehab RN have worked together to provide customized collaborative inpatient rehab.  She was maintained on aspirin and Plavix throughout her stay with serial CBC showing H&H and platelets to be stable.  Headaches have been managed on home dose Topamax.  Blood pressures were monitored on twice daily basis and have been reasonably controlled.  She did report right shoulder pain with increase in activity and Voltaren gel was added on 3 times daily basis and she was educated on body mechanics and positioning with improvement in symptoms.  Tramadol has been used judiciously for severe pain.  Bowel program was augmented to help manage constipation.  Her mood has been stable and she is showing good participation and progress during her stay.  She is showing improvement in pain coordination and currently has progressed to supervision level.  She requested shortening length of stay and will continue to receive outpatient PT and OT at  [facility] outpatient rehab after discharge   Rehab course: During patient's stay in rehab team conference was held to monitor patient's progress, set goals and discuss barriers to discharge. At admission, patient required mod assist with  ADLs and min assist with mobility. She  has had improvement in activity tolerance, balance, postural control as well as ability to compensate for deficits. She has had improvement in functional use RUE  and RLE as well as improvement in awareness.  She is able to complete ADL tasks with supervision.  She requires supervision for transfers and to ambulate 20 feet with rolling walker.  Disposition: Home  Diet: Heart healthy  Special Instructions: 1.  No  driving or strenuous activity till cleared by MD. 2.  To follow-up with orthotics for reevaluation of AFO.   Discharge Instructions    Ambulatory referral to Physical Medicine Rehab   Complete by: As directed    2-4 week follow up appointment     Allergies as of 01/05/2021      Reactions   Clavulanic Acid Diarrhea   Oxycodone Itching   Tape Rash      Medication List    TAKE these medications   acetaminophen 650 MG CR tablet Commonly known as: TYLENOL Take 650 mg by mouth 2 (two) times daily as needed for pain.   albuterol (2.5 MG/3ML) 0.083% nebulizer solution Commonly known as: PROVENTIL Take 3 mLs (2.5 mg total) by nebulization every 6 (six) hours as needed for wheezing or shortness of breath. What changed: Another medication with the same name was removed. Continue taking this medication, and follow the directions you see here.   aspirin 81 MG EC tablet Take 1 tablet (81 mg total) by mouth daily. Swallow whole.   atorvastatin 80 MG tablet Commonly known as: LIPITOR Take 1 tablet (80 mg total) by mouth daily.   cetirizine 10 MG chewable tablet Commonly known as: ZYRTEC Chew 10 mg by mouth daily as needed for allergies.   citalopram 20 MG tablet Commonly known as: CELEXA Take 1 tablet (20 mg total) by mouth daily.   clopidogrel 75 MG tablet Commonly known as: PLAVIX Take 1 tablet (75 mg total) by mouth daily. Notes to patient: Stop after 2 weeks.    diclofenac Sodium 1 % Gel Commonly known as: VOLTAREN Apply 2 g topically 4 (four) times daily. To right shoulder   melatonin 5 MG Tabs Take 5-10 mg by mouth at bedtime as needed (sleep).   MULTIVITAMIN ADULTS 50+ PO Take 1 capsule by mouth daily.   NASACORT ALLERGY 24HR NA Place 1-2 sprays into both nostrils daily as needed (allergies).   polyethylene glycol 17 g packet Commonly known as: MIRALAX / GLYCOLAX Take 17 g by mouth 2 (two) times daily. Notes to patient: Purchase over the counter   topiramate 25  MG tablet Commonly known as: TOPAMAX Take 1 tablet (25 mg total) by mouth daily.   traMADol 50 MG tablet--Rx # 20 pills Commonly known as: ULTRAM Take 1 tablet (50 mg total) by mouth every 6 (six) hours as needed for moderate pain or severe pain. Notes to patient: Max one pill daily as needed   vitamin B-12 500 MCG tablet Commonly known as: CYANOCOBALAMIN Take 500 mcg by mouth daily.   Vitamin D (Cholecalciferol) 25 MCG (1000 UT) Caps Take 1,000 Units by mouth daily.       Follow-up Information    Marcello Fennel, MD Follow up.   Specialty: Physical Medicine and Rehabilitation Why: Office will call you with follow up appointment Contact information: [address] [place] [address] [phone]        Reubin Milan, MD. Call.   Specialty: Internal Medicine Why: for  post hospital follow up Contact information: [address] [place] [address] [phone]        Morene Crocker, MD. Call.   Specialty: Neurology Why: for post stroke follow up Contact information: [address] [facility] [address] [facility] [place] [address] [phone]               Signed: Jacquelynn Cree 01/05/2021, 5:42 PM

## 2021-01-05 NOTE — Progress Notes (Signed)
+/-   sleep, excited about going home. Continues to complain of right shoulder discomfort. PRN tylenol or ultram, along with voltaren gel, manages pain.  Wanting different AFO, current one doesn't fit per patient. [name] A

## 2021-01-06 ENCOUNTER — Telehealth: Payer: Self-pay

## 2021-01-06 NOTE — Telephone Encounter (Signed)
Transition Care Management Unsuccessful Follow-up Telephone Call  Date of discharge and from where:  01/05/21 [facility]  Attempts:  1st Attempt  Reason for unsuccessful TCM follow-up call:  Left voice message for pt to contact me directly at [phone] or contact the office for TCM call and confirm hospital follow up appt on 01/09/21 @11 :00 with Dr. .

## 2021-01-07 ENCOUNTER — Telehealth: Payer: Self-pay

## 2021-01-07 NOTE — Telephone Encounter (Signed)
Transition Care Management Follow-up Telephone Call  Date of discharge and from where: 01/05/21 [facility]  How have you been since you were released from the hospital? Pt states she is doing okay. Pt c/o right shoulder pain 3/10 using voltaren gel & tylenol for pain. Has tramadol on hand to use after physical therapy if needed.   Any questions or concerns? No  Items Reviewed:  Did the pt receive and understand the discharge instructions provided? Yes   Medications obtained and verified? Yes   Other? No   Any new allergies since your discharge? No   Dietary orders reviewed? Yes  Do you have support at home? Yes   Home Care and Equipment/Supplies: Were home health services ordered? No - patient to complete outpatient physical and occupational therapy.  Were any new equipment or medical supplies ordered?  Yes: walker and shower chari Were you able to get the supplies/equipment? yes Do you have any questions related to the use of the equipment or supplies? No  Functional Questionnaire: (I = Independent and D = Dependent) ADLs: I  Bathing/Dressing- I with assistance  Meal Prep- I with assistance   Eating- I  Maintaining continence- I  Transferring/Ambulation- I  Managing Meds- I  Follow up appointments reviewed:   PCP Hospital f/u appt confirmed? Yes  Scheduled to see Dr. Judithann Graves on 01/09/21 @ 11:00  Specialist Hospital f/u appt confirmed? Yes  Scheduled to see Dr. Allena Katz on 01/14/21.  Are transportation arrangements needed? No   If their condition worsens, is the pt aware to call PCP or go to the Emergency Dept.? Yes  Was the patient provided with contact information for the PCP's office or ED? Yes  Was to pt encouraged to call back with questions or concerns? Yes

## 2021-01-07 NOTE — Telephone Encounter (Signed)
Transitional Care call--Patient   1. Are you/is patient experiencing any problems since coming home? No Are there any questions regarding any aspect of care? No 2. Are there any questions regarding medications administration/dosing? No Are meds being taken as prescribed? Yes Patient should review meds with caller to confirm 3. Have there been any falls? NO 4. Has Home Health been to the house and/or have they contacted you? Yes Outpt  If not, have you tried to contact them? Can we help you contact them? 5. Are bowels and bladder emptying properly? Yes Are there any unexpected incontinence issues? No If applicable, is patient following bowel/bladder programs? 6. Any fevers, problems with breathing, unexpected pain? No 7. Are there any skin problems or new areas of breakdown? No 8. Has the patient/family member arranged specialty MD follow up (ie cardiology/neurology/renal/surgical/etc)? Yes Can we help arrange? 9. Does the patient need any other services or support that we can help arrange? No 10. Are caregivers following through as expected in assisting the patient? Yes 11. Has the patient quit smoking, drinking alcohol, or using drugs as recommended? Yes  Appointment time 2:40pm , arrive time 2:20 pm with Riley Lam on 01/14/21 then Dr. Allena Katz [address] [phone]

## 2021-01-08 ENCOUNTER — Other Ambulatory Visit: Payer: Self-pay

## 2021-01-08 ENCOUNTER — Ambulatory Visit: Payer: Self-pay | Attending: Physician Assistant | Admitting: Physical Therapy

## 2021-01-08 VITALS — BP 120/60 | HR 102

## 2021-01-08 DIAGNOSIS — R2681 Unsteadiness on feet: Secondary | ICD-10-CM | POA: Insufficient documentation

## 2021-01-08 DIAGNOSIS — M6281 Muscle weakness (generalized): Secondary | ICD-10-CM | POA: Insufficient documentation

## 2021-01-08 DIAGNOSIS — R278 Other lack of coordination: Secondary | ICD-10-CM | POA: Insufficient documentation

## 2021-01-08 DIAGNOSIS — R2689 Other abnormalities of gait and mobility: Secondary | ICD-10-CM | POA: Insufficient documentation

## 2021-01-08 DIAGNOSIS — J4520 Mild intermittent asthma, uncomplicated: Secondary | ICD-10-CM | POA: Insufficient documentation

## 2021-01-08 DIAGNOSIS — I639 Cerebral infarction, unspecified: Secondary | ICD-10-CM | POA: Insufficient documentation

## 2021-01-08 NOTE — Therapy (Signed)
[place] [facility] MAIN [facility] SERVICES [address] [place], [place], [address] Phone: [phone]   Fax:  [phone]  Physical Therapy Evaluation  Patient Details  Name: [name] MRN: [id number] Date of Birth: 01/07/1961 Referring Provider (PT): Dr. Mariam Dollar   Encounter Date: 01/08/2021   PT End of Session - 01/08/21 1009    Visit Number 1    Number of Visits 25    Date for PT Re-Evaluation 04/02/21    Authorization Type eval: 02/10    PT Start Time 1010    PT Stop Time 1100    PT Time Calculation (min) 50 min    Equipment Utilized During Treatment Gait belt    Activity Tolerance Patient tolerated treatment well;Patient limited by fatigue    Behavior During Therapy [facility] for tasks assessed/performed           Past Medical History:  Diagnosis Date  . Arthritis    hands, feet, knees  . Asthma   . Migraine   . Mood change    due to menopause/ hot flashes  . PONV (postoperative nausea and vomiting)     Past Surgical History:  Procedure Laterality Date  . ABDOMINAL HYSTERECTOMY    . APPENDECTOMY    . CESAREAN SECTION     x 1  . COLONOSCOPY  2012   due in 2017 for suspicious lesion  . COLONOSCOPY WITH PROPOFOL N/A 08/20/2016   Procedure: COLONOSCOPY WITH PROPOFOL;  Surgeon: Midge Minium, MD;  Location: [facility];  Service: Endoscopy;  Laterality: N/A;  . PARTIAL HYSTERECTOMY     one ovary left  . REPAIR ANKLE LIGAMENT     from fall  . ROTATOR CUFF REPAIR Left    shoulder  . TONSILLECTOMY      Vitals:   01/08/21 1022  BP: 120/60  Pulse: (!) 102  SpO2: 98%      Subjective Assessment - 01/08/21 1008    Subjective Patient reports she is sore from physical activity at home.    Patient is accompained by: Family member    Pertinent History 60 y.o. female with history of migraines, vitamin D deficiency who was originally diagnosed with left pontine infarct on 12/19/2020.  She was readmitted on 12/23/2020 with  slurred speech and worsening of right-sided weakness.  MRI brain done revealing acute/subacute infarct left paramedian pons that had increased in size and small chronic left cerebellar infarct.  CTA head/neck was negative for hemorrhage and no LVO noted therefore not TPA candidate.  Stroke was felt to be due to small vessel disease and neurology recommended DAPT x3 weeks followed by aspirin alone as well as CIR as mainstay of treatment.  Therapy evaluations done revealing right-sided weakness with deficits in balance as well as decreased ability to carry out ADLs.  CIR was recommended due to functional decline. She was admitted to rehab 12/26/2020 for inpatient therapies to consist of PT and OT at least three hours five days a week. Past admission physiatrist, therapy team and rehab RN have worked together to provide customized collaborative inpatient rehab. Tramadol has been used judiciously for severe pain.  Her mood has been stable and she is showing good participation and progress during her stay.  She is showing improvement in pain coordination and currently has progressed to supervision level.  She requested shortening length of stay and will continue to receive outpatient PT and OT at [facility] outpatient rehab after discharge on 01/05/2021.    How long can you  sit comfortably? 2 hours    How long can you stand comfortably? 5 mins    How long can you walk comfortably? 5-10 mins    Currently in Pain? Yes    Pain Score 4     Pain Location Shoulder    Pain Orientation Right    Pain Descriptors / Indicators Aching    Pain Type Acute pain              [facility] PT Assessment - 01/08/21 1011      Assessment   Medical Diagnosis Pontine stroke    Referring Provider (PT) Dr. Mariam Dollar    Onset Date/Surgical Date 12/19/20    Hand Dominance Right    Prior Therapy Good outcomes with inpatient therapy at [facility]      Precautions   Precautions None      Restrictions   Weight Bearing Restrictions  No      Balance Screen   Has the patient fallen in the past 6 months No    Has the patient had a decrease in activity level because of a fear of falling?  Yes    Is the patient reluctant to leave their home because of a fear of falling?  No      Home Tourist information centre manager residence    Living Arrangements Spouse/significant other    Available Help at Discharge Family    Type of Home House    Home Access Stairs to enter    Entrance Stairs-Number of Steps 3    Entrance Stairs-Rails Cannot reach both;Right;Left    Home Layout One level    Home Equipment Walker - 2 wheels;Cane - quad;Shower seat      Prior Function   Level of Independence Independent    Vocation Full time employment;Works at home      Cognition   Overall Cognitive Status Within Functional Limits for tasks assessed      Observation/Other Assessments   Focus on Therapeutic Outcomes (FOTO)  58      Coordination   Gross Motor Movements are Fluid and Coordinated No    Fine Motor Movements are Fluid and Coordinated No    Coordination and Movement Description Blocked movement patterns in her leg due to weakness, especially in her knee flexors    Finger Nose Finger Test impaired RUE due to weakness and motor control impairments    Heel Shin Test limited ROM R LE      ROM / Strength   AROM / PROM / Strength AROM;Strength      Strength   Strength Assessment Site Hip;Knee;Ankle    Right/Left Hip Right;Left    Right Hip Flexion 3+/5    Right Hip Extension 2+/5    Right Hip ABduction 3+/5    Left Hip Flexion 4+/5    Left Hip Extension 2+/5    Left Hip ABduction 3+/5    Right/Left Knee Left    Right Knee Flexion 2-/5    Right Knee Extension 4-/5    Left Knee Flexion 4-/5    Left Knee Extension 4+/5    Right/Left Ankle Left    Right Ankle Dorsiflexion 1/5    Right Ankle Plantar Flexion 2+/5    Left Ankle Dorsiflexion 4/5    Left Ankle Plantar Flexion 4/5      6 Minute Walk- Baseline   6  Minute Walk- Baseline yes    BP (mmHg) 120/60    HR (bpm) 102    02  Sat (%RA) 98 %      6 Minute walk- Post Test   6 Minute Walk Post Test yes    HR (bpm) 110    02 Sat (%RA) 98 %    Perceived Rate of Exertion (Borg) 15- Hard      6 minute walk test results    Aerobic Endurance Distance Walked 840    Endurance additional comments patient ambulated with 2WW      Balance   Balance Assessed Yes      Standardized Balance Assessment   Standardized Balance Assessment Timed Up and Go Test;Five Times Sit to Stand;10 meter walk test    Five times sit to stand comments  17.43s    10 Meter Walk 0.66 m/s      Timed Up and Go Test   TUG Normal TUG    Normal TUG (seconds) 17.38   with 2WW           OBJECTIVE   Gait Patient ambulates with decreased R ankle DF during initial contact causing a foot drop/drag while ambulating. She has an ankle AFO but does not use it due to improper fitting and no change in results with quality of ambulating. Patient demonstrates scissoring gait pattern towards the end of due to significant weakness in her RLE.   FUNCTIONAL OUTCOME MEASURES 5STS: 17.43s without BUE support TUG: 17.38s  : 0.66 m/s : 840 ft  FOTO: 58   ASSESSMENT Clinical Impression: Patient is a pleasant 60 year-old female referred for recent pontine stroke that is affecting her R side.  PT examination reveals deficits in balance as indicated by increased TUG score of 17.38 seconds and 0.66 m/s on her placing patient at a high fall risk. She demonstrates decreased RLE motion with no change in pain. Patient presents with deficits in strength, gait and balance. Patient will benefit from skilled PT services to address deficits in balance and decrease risk for future falls and improve RLE strength.         PT Education - 01/08/21 1124    Education Details Importance of monitoring BP at home with hx of recent stroke    Person(s) Educated Patient;Spouse    Methods  Explanation    Comprehension Verbalized understanding            PT Short Term Goals - 01/08/21 1131      PT SHORT TERM GOAL #1   Title Patient will be independent in home exercise program to improve strength/mobility for better functional independence with ADLs.    Time 6    Period Weeks    Status New    Target Date 02/19/21      PT SHORT TERM GOAL #2   Title Pt will increase strength of by at least 1/2 MMT grade in order to demonstrate improvement in strength and function.    Baseline 02/10: RLE 3+/5    Time 6    Period Weeks    Status New    Target Date 02/19/21             PT Long Term Goals - 01/08/21 1133      PT LONG TERM GOAL #1   Title Patient will increase FOTO score to equal to or greater than to 71 demonstrate statistically significant improvement in mobility and quality of life.    Baseline 02/10: 58    Time 12    Period Weeks    Status New    Target Date 04/02/21  PT LONG TERM GOAL #2   Title Patient (> 63 years old) will complete five times sit to stand test in < 15 seconds indicating an increased LE strength and improved balance.    Baseline 02/10: 17.43s without BUE support    Time 12    Period Weeks    Status New    Target Date 04/02/21      PT LONG TERM GOAL #3   Title Patient will increase six minute walk test distance to >1000 for progression to community ambulator and improve gait ability    Baseline 02/10: 840 ft    Time 12    Period Weeks    Status New    Target Date 04/02/21      PT LONG TERM GOAL #4   Title Patient will increase 10 meter walk test to >1.101m/s as to improve gait speed for better community ambulation and to reduce fall risk.    Baseline 02/10: 0.66 m/s    Time 12    Period Weeks    Status New    Target Date 04/02/21      PT LONG TERM GOAL #5   Title Patient will reduce timed up and go to <11 seconds to reduce fall risk and demonstrate improved transfer/gait ability.    Baseline 02/10: 17.38s with 2WW    Time  12    Period Weeks    Status New    Target Date 04/02/21                  Plan - 01/08/21 1009    Clinical Impression Statement Patient is a pleasant 60 year-old female referred for recent pontine stroke that is affecting her R side.  PT examination reveals deficits in balance as indicated by increased TUG score of 17.38 seconds and 0.66 m/s on her placing patient at a high fall risk. She demonstrates decreased RLE motion with no change in pain. Patient presents with deficits in strength, gait and balance. Patient will benefit from skilled PT services to address deficits in balance and decrease risk for future falls and improve RLE strength.    Personal Factors and Comorbidities Comorbidity 3+    Comorbidities Acute CVA, pontine stroke, migraine    Examination-Activity Limitations Bathing;Dressing;Reach Overhead;Squat    Examination-Participation Restrictions Driving;Meal Prep    Stability/Clinical Decision Making Evolving/Moderate complexity    Clinical Decision Making Moderate    Rehab Potential Good    PT Frequency 2x / week    PT Duration 12 weeks    PT Treatment/Interventions ADLs/Self Care Home Management;Canalith Repostioning;Electrical Stimulation;Moist Heat;Gait training;Stair training;Functional mobility training;Therapeutic activities;Therapeutic exercise;Balance training;Neuromuscular re-education;Patient/family education;Manual techniques;Passive range of motion;Energy conservation;Vestibular    PT Next Visit Plan Initiate BLE strengthening    PT Home Exercise Plan Initiate HEP    Consulted and Agree with Plan of Care Patient;Family member/caregiver    Family Member Consulted husband           Patient will benefit from skilled therapeutic intervention in order to improve the following deficits and impairments:  Abnormal gait,Decreased endurance,Decreased strength,Decreased activity tolerance,Decreased balance,Decreased mobility,Difficulty walking,Improper body  mechanics,Decreased coordination  Visit Diagnosis: Left pontine stroke (HCC)  Mild intermittent asthma without complication  Muscle weakness (generalized)  Unsteadiness on feet  Other abnormalities of gait and mobility     Problem List Patient Active Problem List   Diagnosis Date Noted  . Slow transit constipation   . Strain of right rotator cuff capsule   . Migraine without status migrainosus,  not intractable   . Uncomplicated asthma   . Right hemiparesis (HCC)   . 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 History of migraine headaches 12/23/2020  . Acute CVA (cerebrovascular accident) (HCC) 12/19/2020  . Status post total hysterectomy and bilateral salpingo-oophorectomy 09/23/2020  . Generalized osteoarthritis of hand 01/27/2017  . Chronic tension-type headache, not intractable 01/17/2017  . Mood disorder (HCC) 07/13/2016  . Foot pain, left 07/13/2016  . Prediabetes 11/07/2015  . Mixed hyperlipidemia 11/07/2015  . Low serum thyroid stimulating hormone (TSH) 09/30/2015  . Osteoarthritis 09/29/2015  . Menopausal syndrome (hot flashes) 09/29/2015  . Obesity, Class I, BMI 30-34.9 09/26/2015  . Vitamin D deficiency 09/26/2015  . Migraine headache without aura 09/26/2015  . Allergic asthma, mild intermittent, uncomplicated 09/26/2015  . Colon polyp 09/26/2015  . Synovial cyst of left knee 09/26/2015  . Environmental and seasonal allergies 09/26/2015   [name] HiddenHazel Bharat [name] PT, DPT Amelia JoHazel M Tihanna [name] 01/08/2021, 12:25 PM  [facility] [address] [place], [place], [id number] Phone: [phone]   Fax:  [phone]  Name: [name] PasseCandice [name] MRN: [id number] Date of Birth: 11/28/1961

## 2021-01-09 ENCOUNTER — Ambulatory Visit (INDEPENDENT_AMBULATORY_CARE_PROVIDER_SITE_OTHER): Payer: Self-pay | Admitting: Internal Medicine

## 2021-01-09 ENCOUNTER — Other Ambulatory Visit: Payer: Self-pay

## 2021-01-09 ENCOUNTER — Encounter: Payer: Self-pay | Admitting: Internal Medicine

## 2021-01-09 VITALS — BP 122/76 | HR 83 | Temp 97.9°F | Ht 64.0 in | Wt 172.0 lb

## 2021-01-09 DIAGNOSIS — I639 Cerebral infarction, unspecified: Secondary | ICD-10-CM

## 2021-01-09 DIAGNOSIS — G8191 Hemiplegia, unspecified affecting right dominant side: Secondary | ICD-10-CM

## 2021-01-09 DIAGNOSIS — Z8669 Personal history of other diseases of the nervous system and sense organs: Secondary | ICD-10-CM

## 2021-01-09 DIAGNOSIS — F39 Unspecified mood [affective] disorder: Secondary | ICD-10-CM

## 2021-01-09 NOTE — Progress Notes (Signed)
Date:  01/09/2021   Name:  [name]   DOB:  [birth year]   MRN:  [id number]   Chief Complaint: Hospitalization Follow-up (Stroke. Pt feels good today, fatigue ) Hospital follow up from CVA.  Admitted 12/23/20 with right side weakness and dyarthria that improved.  Was discharged home on plavix.   Readmitted to [facility] on 12/26/20 to 01/05/21 with worsening symptoms.  She received at [facility] call on 01/06/21.  Hospital Course: [name] was admitted to rehab 12/26/2020 for inpatient therapies to consist of PT and OT at least three hours five days a week. Past admission physiatrist, therapy team and rehab RN have worked together to provide customized collaborative inpatient rehab.  She was maintained on aspirin and Plavix throughout her stay with serial CBC showing H&H and platelets to be stable.  Headaches have been managed on home dose Topamax.  Blood pressures were monitored on twice daily basis and have been reasonably controlled.  She did report right shoulder pain with increase in activity and Voltaren gel was added on 3 times daily basis and she was educated on body mechanics and positioning with improvement in symptoms.  Tramadol has been used judiciously for severe pain.  Bowel program was augmented to help manage constipation.  Her mood has been stable and she is showing good participation and progress during her stay.  She is showing improvement in pain coordination and currently has progressed to supervision level.  She requested shortening length of stay and will continue to receive outpatient PT and OT at  [facility] outpatient rehab after discharge.  Extremity Weakness  The pain is present in the right arm and right lower leg. This is a new problem. The problem has been gradually improving. The patient is experiencing no pain. Pertinent negatives include no fever or numbness. Treatments tried: working with outpt PT and OT. The treatment provided moderate relief.  Migraine  This is a recurrent  problem. The problem has been unchanged. Associated symptoms include weakness (mild RUE and LUE). Pertinent negatives include no coughing, dizziness, fever or numbness. Treatments tried: on topamax but has stopped triptans due to recent CVA. The treatment provided significant (used tramadol while in the hospital) relief.    Lab Results  Component Value Date   CREATININE 0.89 01/05/2021   BUN 18 01/05/2021   NA 141 01/05/2021   K 4.0 01/05/2021   CL 106 01/05/2021   CO2 27 01/05/2021   Lab Results  Component Value Date   CHOL 168 12/24/2020   HDL 68 12/24/2020   LDLCALC 86 12/24/2020   TRIG 70 12/24/2020   CHOLHDL 2.5 12/24/2020   Lab Results  Component Value Date   TSH 1.330 09/17/2020   Lab Results  Component Value Date   HGBA1C 5.6 12/24/2020   Lab Results  Component Value Date   WBC 6.4 01/05/2021   HGB 13.6 01/05/2021   HCT 41.6 01/05/2021   MCV 92.7 01/05/2021   PLT 277 01/05/2021   Lab Results  Component Value Date   ALT 21 12/27/2020   AST 22 12/27/2020   ALKPHOS 50 12/27/2020   BILITOT 0.9 12/27/2020     Review of Systems  Constitutional: Positive for fatigue. Negative for chills, fever and unexpected weight change.  Respiratory: Negative for cough, chest tightness, shortness of breath and wheezing.   Cardiovascular: Negative for chest pain, palpitations and leg swelling.  Musculoskeletal: Positive for arthralgias (right shoulder and upper arm), extremity weakness, gait problem and myalgias.  Neurological: Positive for  weakness (mild RUE and LUE). Negative for dizziness, light-headedness, numbness and headaches.  Psychiatric/Behavioral: Negative for dysphoric mood and sleep disturbance. The patient is not nervous/anxious.     Patient Active Problem List   Diagnosis Date Noted  . Slow transit constipation   . Strain of right rotator cuff capsule   . Migraine without status migrainosus, not intractable   . Uncomplicated asthma   . Right hemiparesis  (HCC)   . 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 History of migraine headaches 12/23/2020  . Acute CVA (cerebrovascular accident) (HCC) 12/19/2020  . Status post total hysterectomy and bilateral salpingo-oophorectomy 09/23/2020  . Generalized osteoarthritis of hand 01/27/2017  . Chronic tension-type headache, not intractable 01/17/2017  . Mood disorder (HCC) 07/13/2016  . Foot pain, left 07/13/2016  . Prediabetes 11/07/2015  . Mixed hyperlipidemia 11/07/2015  . Low serum thyroid stimulating hormone (TSH) 09/30/2015  . Osteoarthritis 09/29/2015  . Menopausal syndrome (hot flashes) 09/29/2015  . Obesity, Class I, BMI 30-34.9 09/26/2015  . Vitamin D deficiency 09/26/2015  . Migraine headache without aura 09/26/2015  . Allergic asthma, mild intermittent, uncomplicated 09/26/2015  . Colon polyp 09/26/2015  . Synovial cyst of left knee 09/26/2015  . Environmental and seasonal allergies 09/26/2015    Allergies  Allergen Reactions  . Clavulanic Acid Diarrhea  . Oxycodone Itching  . Tape Rash    Past Surgical History:  Procedure Laterality Date  . ABDOMINAL HYSTERECTOMY    . APPENDECTOMY    . CESAREAN SECTION     x 1  . COLONOSCOPY  2012   due in 2017 for suspicious lesion  . COLONOSCOPY WITH PROPOFOL N/A 08/20/2016   Procedure: COLONOSCOPY WITH PROPOFOL;  Surgeon: Midge Minium, MD;  Location: [facility];  Service: Endoscopy;  Laterality: N/A;  . PARTIAL HYSTERECTOMY     one ovary left  . REPAIR ANKLE LIGAMENT     from fall  . ROTATOR CUFF REPAIR Left    shoulder  . TONSILLECTOMY      Social History   Tobacco Use  . Smoking status: Never Smoker  . Smokeless tobacco: Never Used  Vaping Use  . Vaping Use: Never used  Substance Use Topics  . Alcohol use: Yes    Alcohol/week: 0.0 standard drinks    Comment: 2 drinks - 2x/mo  . Drug use: No     Medication list has been reviewed and updated.  Current Meds  Medication Sig  . acetaminophen (TYLENOL) 650 MG  CR tablet Take 650 mg by mouth 2 (two) times daily as needed for pain.  Marland Kitchen albuterol (PROVENTIL) (2.5 MG/3ML) 0.083% nebulizer solution Take 3 mLs (2.5 mg total) by nebulization every 6 (six) hours as needed for wheezing or shortness of breath.  Marland Kitchen aspirin EC 81 MG EC tablet Take 1 tablet (81 mg total) by mouth daily. Swallow whole.  Marland Kitchen atorvastatin (LIPITOR) 80 MG tablet Take 1 tablet (80 mg total) by mouth daily.  . cetirizine (ZYRTEC) 10 MG chewable tablet Chew 10 mg by mouth daily as needed for allergies.  . citalopram (CELEXA) 20 MG tablet Take 1 tablet (20 mg total) by mouth daily.  . clopidogrel (PLAVIX) 75 MG tablet Take 1 tablet (75 mg total) by mouth daily.  . cyanocobalamin 500 MCG tablet Take 500 mcg by mouth daily.  . diclofenac Sodium (VOLTAREN) 1 % GEL Apply 2 g topically 4 (four) times daily. To right shoulder  . melatonin 5 MG TABS Take 5-10 mg by mouth at bedtime as needed (  sleep).  . Multiple Vitamins-Minerals (MULTIVITAMIN ADULTS 50+ PO) Take 1 capsule by mouth daily.  . Pyridoxine HCl (VITAMIN B6 PO) Take by mouth.  . topiramate (TOPAMAX) 25 MG tablet Take 1 tablet (25 mg total) by mouth daily.  . traMADol (ULTRAM) 50 MG tablet Take 1 tablet (50 mg total) by mouth every 6 (six) hours as needed for moderate pain or severe pain.  . Triamcinolone Acetonide (NASACORT ALLERGY 24HR NA) Place 1-2 sprays into both nostrils daily as needed (allergies).  . Vitamin D, Cholecalciferol, 1000 UNITS CAPS Take 1,000 Units by mouth daily.    PHQ 2/9 Scores 01/09/2021 11/17/2020 09/17/2020 03/04/2020  PHQ - 2 Score 0 0 2 1  PHQ- 9 Score 0 0 6 1    GAD 7 : Generalized Anxiety Score 01/09/2021 11/17/2020 09/17/2020  Nervous, Anxious, on Edge 1 0 1  Control/stop worrying 1 0 1  Worry too much - different things 1 0 1  Trouble relaxing 0 0 1  Restless 0 0 1  Easily annoyed or irritable 1 0 1  Afraid - awful might happen 1 0 1  Total GAD 7 Score 5 0 7  Anxiety Difficulty - Not difficult at all  Not difficult at all    BP Readings from Last 3 Encounters:  01/09/21 122/76  01/08/21 120/60  01/05/21 117/66    Physical Exam Constitutional:      Appearance: Normal appearance.  HENT:     Head: Normocephalic.  Neck:     Vascular: No carotid bruit.  Cardiovascular:     Rate and Rhythm: Normal rate and regular rhythm.     Pulses: Normal pulses.  Pulmonary:     Effort: Pulmonary effort is normal.     Breath sounds: No wheezing or rhonchi.  Musculoskeletal:     Cervical back: Normal range of motion.     Right lower leg: No edema.     Left lower leg: No edema.  Lymphadenopathy:     Cervical: No cervical adenopathy.  Skin:    General: Skin is warm.     Capillary Refill: Capillary refill takes less than 2 seconds.  Neurological:     Mental Status: She is alert and oriented to person, place, and time.     Motor: Weakness (right upper ext and right lower leg) present.     Gait: Gait abnormal (walking with walker).  Psychiatric:        Mood and Affect: Mood normal.        Behavior: Behavior normal.     Wt Readings from Last 3 Encounters:  01/09/21 172 lb (78 kg)  12/26/20 177 lb 7.5 oz (80.5 kg)  12/26/20 174 lb 13.2 oz (79.3 kg)    BP 122/76   Pulse 83   Temp 97.9 F (36.6 C) (Oral)   Ht 5\' 4"  (1.626 m)   Wt 172 lb (78 kg)   SpO2 97%   BMI 29.52 kg/m   Assessment and Plan: 1. Left pontine stroke (HCC) Now on statin and Plavix for one month Doing very well - improving daily Continue with OT and PT and home exercise  2. History of migraine headaches May continue Topamax for now until Neurology follow up Stop triptan Use Tramadol PRN for now  3. Mood disorder (HCC) Clinically stable on current regimen with good control of symptoms, No SI or HI. Will continue current therapy.  4. Right hemiparesis (HCC) Improving  Not currently using her ankle brace -   Partially dictated using  Animal nutritionist. Any errors are unintentional.  Bari Edward,  MD [facility]  01/09/2021

## 2021-01-12 ENCOUNTER — Ambulatory Visit: Payer: Self-pay

## 2021-01-13 ENCOUNTER — Other Ambulatory Visit: Payer: Self-pay

## 2021-01-13 ENCOUNTER — Ambulatory Visit: Payer: Self-pay

## 2021-01-13 ENCOUNTER — Other Ambulatory Visit: Payer: Self-pay | Admitting: Physical Medicine and Rehabilitation

## 2021-01-13 DIAGNOSIS — M6281 Muscle weakness (generalized): Secondary | ICD-10-CM

## 2021-01-13 DIAGNOSIS — R2681 Unsteadiness on feet: Secondary | ICD-10-CM

## 2021-01-13 DIAGNOSIS — R2689 Other abnormalities of gait and mobility: Secondary | ICD-10-CM

## 2021-01-13 MED ORDER — CLOPIDOGREL BISULFATE 75 MG PO TABS: 75.0000 mg | ORAL_TABLET | Freq: Every day | ORAL | 0 refills | Status: DC

## 2021-01-13 MED ORDER — TIZANIDINE HCL 4 MG PO TABS: 2.0000 mg | ORAL_TABLET | Freq: Every day | ORAL | 0 refills | Status: DC

## 2021-01-13 NOTE — Therapy (Signed)
[facility] MAIN [facility] SERVICES [address] [place], [place], [address] Phone: [phone]   Fax:  [phone]  Physical Therapy Treatment  Patient Details  Name: [name] MRN: [id number] Date of Birth: 02/04/1961 Referring Provider (PT): Dr. Mariam Dollar   Encounter Date: 01/13/2021   PT End of Session - 01/13/21 1716    Visit Number 2    Number of Visits 25    Date for PT Re-Evaluation 04/02/21    Authorization Type eval: 02/10    PT Start Time 1434    PT Stop Time 1513    PT Time Calculation (min) 39 min    Equipment Utilized During Treatment Gait belt    Activity Tolerance Patient tolerated treatment well    Behavior During Therapy [facility] for tasks assessed/performed           Past Medical History:  Diagnosis Date  . Acute CVA (cerebrovascular accident) (HCC) 12/19/2020  . Arthritis    hands, feet, knees  . Asthma   . Migraine   . Mood change    due to menopause/ hot flashes  . PONV (postoperative nausea and vomiting)     Past Surgical History:  Procedure Laterality Date  . ABDOMINAL HYSTERECTOMY    . APPENDECTOMY    . CESAREAN SECTION     x 1  . COLONOSCOPY  2012   due in 2017 for suspicious lesion  . COLONOSCOPY WITH PROPOFOL N/A 08/20/2016   Procedure: COLONOSCOPY WITH PROPOFOL;  Surgeon: Midge Minium, MD;  Location: [facility];  Service: Endoscopy;  Laterality: N/A;  . PARTIAL HYSTERECTOMY     one ovary left  . REPAIR ANKLE LIGAMENT     from fall  . ROTATOR CUFF REPAIR Left    shoulder  . TONSILLECTOMY      There were no vitals filed for this visit.   Subjective Assessment - 01/13/21 1714    Subjective Pt reports R shoulder pain. Pt says sleeping with pillow under R shoulder helped a lot and she "feels a lot better because of it."  Pt reports at home she walks and does leg lifts for exercise.    Patient is accompained by: Family member    Pertinent History 60 y.o. female with history of  migraines, vitamin D deficiency who was originally diagnosed with left pontine infarct on 12/19/2020.  She was readmitted on 12/23/2020 with slurred speech and worsening of right-sided weakness.  MRI brain done revealing acute/subacute infarct left paramedian pons that had increased in size and small chronic left cerebellar infarct.  CTA head/neck was negative for hemorrhage and no LVO noted therefore not TPA candidate.  Stroke was felt to be due to small vessel disease and neurology recommended DAPT x3 weeks followed by aspirin alone as well as CIR as mainstay of treatment.  Therapy evaluations done revealing right-sided weakness with deficits in balance as well as decreased ability to carry out ADLs.  CIR was recommended due to functional decline. She was admitted to rehab 12/26/2020 for inpatient therapies to consist of PT and OT at least three hours five days a week. Past admission physiatrist, therapy team and rehab RN have worked together to provide customized collaborative inpatient rehab. Tramadol has been used judiciously for severe pain.  Her mood has been stable and she is showing good participation and progress during her stay.  She is showing improvement in pain coordination and currently has progressed to supervision level.  She requested shortening length of stay and  will continue to receive outpatient PT and OT at [facility] outpatient rehab after discharge on 01/05/2021.    How long can you sit comfortably? 2 hours    How long can you stand comfortably? 5 mins    How long can you walk comfortably? 5-10 mins    Currently in Pain? Yes    Pain Location Shoulder    Pain Orientation Right         Treatment:   Seated marches with 3# AW 2x20 pt rates exercise as "medium"  LAQ 2x15 3# AW pt rates exercise as "medium"  Sit<>stands 1x10 pt rates "easy-medium"  Staggered sit<>stand -1x12 B LEs; pt reports slight lateral knee pain on LLE   Standing hip abduction 3# AW - 2x10, 1x5 VC for upright  posture, greater difficulty with R LE as stance; pt rates exercise "medium-hard"  Standing hamstring curls -- 3x12   Standing heel raises at bar 1x20  Seated RTB 2x10, 1x13 pt rates exercise as "medium"  Nustep level 2 x 5 minutes  Access Code: [email or web address] URL: [email or web address] Date: 01/13/2021 Prepared by: Temple Pacini  Exercises Sit to Stand - 1 x daily - 4 x weekly - 3 sets - 10 reps Heel rises with counter support - 1 x daily - 4 x weekly - 3 sets - 15 reps Seated Hip Abduction with Resistance - 1 x daily - 4 x weekly - 3 sets - 15 reps   PT Education - 01/13/21 1715    Education Details Pt educated on HEP and issued HEP (handout and see note)    Person(s) Educated Patient    Methods Explanation;Demonstration;Handout;Verbal cues    Comprehension Verbalized understanding;Returned demonstration            PT Short Term Goals - 01/08/21 1131      PT SHORT TERM GOAL #1   Title Patient will be independent in home exercise program to improve strength/mobility for better functional independence with ADLs.    Time 6    Period Weeks    Status New    Target Date 02/19/21      PT SHORT TERM GOAL #2   Title Pt will increase strength of by at least 1/2 MMT grade in order to demonstrate improvement in strength and function.    Baseline 02/10: RLE 3+/5    Time 6    Period Weeks    Status New    Target Date 02/19/21             PT Long Term Goals - 01/08/21 1133      PT LONG TERM GOAL #1   Title Patient will increase FOTO score to equal to or greater than to 71 demonstrate statistically significant improvement in mobility and quality of life.    Baseline 02/10: 58    Time 12    Period Weeks    Status New    Target Date 04/02/21      PT LONG TERM GOAL #2   Title Patient (> 57 years old) will complete five times sit to stand test in < 15 seconds indicating an increased LE strength and improved balance.    Baseline 02/10: 17.43s without BUE support     Time 12    Period Weeks    Status New    Target Date 04/02/21      PT LONG TERM GOAL #3   Title Patient will increase six minute walk test distance to >1000 for progression to community ambulator and improve gait  ability    Baseline 02/10: 840 ft    Time 12    Period Weeks    Status New    Target Date 04/02/21      PT LONG TERM GOAL #4   Title Patient will increase 10 meter walk test to >1.7773m/s as to improve gait speed for better community ambulation and to reduce fall risk.    Baseline 02/10: 0.66 m/s    Time 12    Period Weeks    Status New    Target Date 04/02/21      PT LONG TERM GOAL #5   Title Patient will reduce timed up and go to <11 seconds to reduce fall risk and demonstrate improved transfer/gait ability.    Baseline 02/10: 17.38s with 2WW    Time 12    Period Weeks    Status New    Target Date 04/02/21                 Plan - 01/13/21 1716    Clinical Impression Statement Pt demonstrates good activity tolerance this session performing multiple standing therex with 3# AWs, where pt had not been using AW prior. Pt greatest difficulty with R LE during standing hip abduction, compensating with lateral lean. Pt also issued HEP via handout this session and educated on exercises. Pt will benefit from further skilled therapy to improve LE strength, mobility and balance.    Personal Factors and Comorbidities Comorbidity 3+    Comorbidities Acute CVA, pontine stroke, migraine    Examination-Activity Limitations Bathing;Dressing;Reach Overhead;Squat    Examination-Participation Restrictions Driving;Meal Prep    Stability/Clinical Decision Making Evolving/Moderate complexity    Rehab Potential Good    PT Frequency 2x / week    PT Duration 12 weeks    PT Treatment/Interventions ADLs/Self Care Home Management;Canalith Repostioning;Electrical Stimulation;Moist Heat;Gait training;Stair training;Functional mobility training;Therapeutic activities;Therapeutic exercise;Balance  training;Neuromuscular re-education;Patient/family education;Manual techniques;Passive range of motion;Energy conservation;Vestibular    PT Next Visit Plan Initiate BLE strengthening; initiate balance exercises    PT Home Exercise Plan Initiate HEP    Consulted and Agree with Plan of Care Patient;Family member/caregiver    Family Member Consulted husband           Patient will benefit from skilled therapeutic intervention in order to improve the following deficits and impairments:  Abnormal gait,Decreased endurance,Decreased strength,Decreased activity tolerance,Decreased balance,Decreased mobility,Difficulty walking,Improper body mechanics,Decreased coordination  Visit Diagnosis: Muscle weakness (generalized)  Unsteadiness on feet  Other abnormalities of gait and mobility     Problem List Patient Active Problem List   Diagnosis Date Noted  . Slow transit constipation   . Strain of right rotator cuff capsule   . Uncomplicated asthma   . Right hemiparesis (HCC)   . 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 History of migraine headaches 12/23/2020  . Status post total hysterectomy and bilateral salpingo-oophorectomy 09/23/2020  . Generalized osteoarthritis of hand 01/27/2017  . Mood disorder (HCC) 07/13/2016  . Foot pain, left 07/13/2016  . Prediabetes 11/07/2015  . Mixed hyperlipidemia 11/07/2015  . Low serum thyroid stimulating hormone (TSH) 09/30/2015  . Osteoarthritis 09/29/2015  . Menopausal syndrome (hot flashes) 09/29/2015  . Obesity, Class I, BMI 30-34.9 09/26/2015  . Vitamin D deficiency 09/26/2015  . Migraine headache without aura 09/26/2015  . Allergic asthma, mild intermittent, uncomplicated 09/26/2015  . Colon polyp 09/26/2015  . Synovial cyst of left knee 09/26/2015  . Environmental and seasonal allergies 09/26/2015   Temple PaciniHaley Elwyn Lowden PT, DPT 01/13/2021, 5:20 PM  [facility] MAIN [facility] SERVICES [address] [place],  [place], [address] Phone: [phone]   Fax:  [phone]  Name: [name] MRN: [id number] Date of Birth: 03-21-1961

## 2021-01-14 ENCOUNTER — Encounter: Payer: Self-pay | Attending: Registered Nurse | Admitting: Registered Nurse

## 2021-01-14 ENCOUNTER — Encounter: Payer: Self-pay | Admitting: Registered Nurse

## 2021-01-14 ENCOUNTER — Other Ambulatory Visit: Payer: Self-pay

## 2021-01-14 VITALS — BP 130/78 | HR 72 | Temp 98.4°F | Ht 64.0 in | Wt 174.2 lb

## 2021-01-14 DIAGNOSIS — G8191 Hemiplegia, unspecified affecting right dominant side: Secondary | ICD-10-CM | POA: Insufficient documentation

## 2021-01-14 DIAGNOSIS — I639 Cerebral infarction, unspecified: Secondary | ICD-10-CM | POA: Insufficient documentation

## 2021-01-14 DIAGNOSIS — S46011D Strain of muscle(s) and tendon(s) of the rotator cuff of right shoulder, subsequent encounter: Secondary | ICD-10-CM | POA: Insufficient documentation

## 2021-01-14 NOTE — Progress Notes (Unsigned)
Subjective:    Patient ID: [name], female    DOB: [birth year], 60 y.o.   MRN: [id number]  HPI: [name] is a 60 y.o. female who is here for Transitional Care Visit for follow up of her Left Pontine Stroke, Right Hemiparesis and Strain of Right Rotator Cuff Capsule.  [name] presented to [facility] ED on 12/23/2020 for weakness. She was recently admitted and discharged on 12/21/2020 with Left pontine infarct.  She presented on 12/23/2020 with complaints of right sided weakness and slurred speech, neurology consulted.  CT Head WO Contrast:  IMPRESSION: No acute intracranial hemorrhage or evidence of acute infarction. Small chronic left cerebellar infarct. MR Brain WO Contrast:  IMPRESSION: 1.2 x 0.7 cm acute/early subacute infarct within the paramedian left pons, increased in size from the brain MRI of 12/19/2020 (previously 1.1 x 0.2 cm).  Stable mild cerebral white matter chronic small vessel ischemic disease.  Redemonstrated small chronic left cerebellar infarct.  [name] was admitted to inpatient rehabilitation on 12/26/2020 and discharged home on 01/05/2021. She is receiving outpatient Therapy at [facility].  She states she has pain in her right shoulder. She rates her pain 1. Also reports she has a good appetite.   Husband in room.   Pain Inventory Average Pain 3 Pain Right Now 1 My pain is intermittent, dull and stabbing  LOCATION OF PAIN  Right shoulder  BOWEL Number of stools per week: 5 Oral laxative use No  Type of laxative  Probiotic Enema or suppository use No  History of colostomy No  Incontinent No   BLADDER Normal In and out cath, frequency n/a Able to self cath No  Bladder incontinence No  Frequent urination No  Leakage with coughing No  Difficulty starting stream No  Incomplete bladder emptying No    Mobility walk with assistance use a walker how many minutes can you walk? 6 mins ability to climb steps?   yes do you drive?  no Do you have any goals in this area?  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  yes  Neuro/Psych weakness trouble walking depression  Prior Studies Any changes since last visit?  no  Physicians involved in your care Any changes since last visit?  no   Family History  Problem Relation Age of Onset  . Diabetes Paternal Uncle   . Breast cancer Paternal Grandmother 43  . CAD Father    Social History   Socioeconomic History  . Marital status: Married    Spouse name: Not on file  . Number of children: Not on file  . Years of education: Not on file  . Highest education level: Not on file  Occupational History  . Not on file  Tobacco Use  . Smoking status: Never Smoker  . Smokeless tobacco: Never Used  Vaping Use  . Vaping Use: Never used  Substance and Sexual Activity  . Alcohol use: Yes    Alcohol/week: 0.0 standard drinks    Comment: 2 drinks - 2x/mo  . Drug use: No  . Sexual activity: Not Currently  Other Topics Concern  . Not on file  Social History Narrative  . Not on file   Social Determinants of Health   Financial Resource Strain: Not on file  Food Insecurity: Not on file  Transportation Needs: Not on file  Physical  Activity: Not on file  Stress: Not on file  Social Connections: Not on file   Past Surgical History:  Procedure Laterality Date  . ABDOMINAL HYSTERECTOMY    . APPENDECTOMY    . CESAREAN SECTION     x 1  . COLONOSCOPY  2012   due in 2017 for suspicious lesion  . COLONOSCOPY WITH PROPOFOL N/A 08/20/2016   Procedure: COLONOSCOPY WITH PROPOFOL;  Surgeon: Midge Minium, MD;  Location: [facility];  Service: Endoscopy;  Laterality: N/A;  . PARTIAL HYSTERECTOMY     one ovary left  . REPAIR ANKLE LIGAMENT     from fall  . ROTATOR CUFF REPAIR Left    shoulder  .  TONSILLECTOMY     Past Medical History:  Diagnosis Date  . Acute CVA (cerebrovascular accident) (HCC) 12/19/2020  . Arthritis    hands, feet, knees  . Asthma   . Migraine   . Mood change    due to menopause/ hot flashes  . PONV (postoperative nausea and vomiting)    There were no vitals taken for this visit.  Opioid Risk Score:   Fall Risk Score:  `1  Depression screen PHQ 2/9  Depression screen [facility] 2/9 01/09/2021 11/17/2020 09/17/2020 03/04/2020 09/17/2019 08/08/2019 01/05/2019  Decreased Interest 0 0 1 0 0 0 0  Down, Depressed, Hopeless 0 0 1 1 0 1 0  PHQ - 2 Score 0 0 2 1 0 1 0  Altered sleeping 0 0 1 0 0 - -  Tired, decreased energy 0 0 1 0 0 - -  Change in appetite 0 0 1 0 0 - -  Feeling bad or failure about yourself  0 0 0 0 0 - -  Trouble concentrating 0 0 1 0 0 - -  Moving slowly or fidgety/restless 0 0 0 0 0 - -  Suicidal thoughts 0 0 0 0 0 - -  PHQ-9 Score 0 0 6 1 0 - -  Difficult doing work/chores - Not difficult at all Not difficult at all Not difficult at all Not difficult at all - -   Review of Systems  Musculoskeletal: Positive for gait problem.       Right shoulder pain  Psychiatric/Behavioral:       Depression  All other systems reviewed and are negative.      Objective:   Physical Exam Vitals and nursing note reviewed.  Constitutional:      Appearance: Normal appearance.  Cardiovascular:     Rate and Rhythm: Normal rate and regular rhythm.     Pulses: Normal pulses.     Heart sounds: Normal heart sounds.  Pulmonary:     Effort: Pulmonary effort is normal.     Breath sounds: Normal breath sounds.  Musculoskeletal:     Cervical back: Normal range of motion and neck supple.     Comments: Normal Muscle Bulk and Muscle Testing Reveals:  Upper Extremities: Right: Decreased ROM 90 Degrees  and Muscle Strength 4/5 Right AC Joint Tenderness Left Upper Extremity: Full ROM and Muscle Strength 5/5 Lower Extremities: Right Lower Extremity: Decreased ROM and  Muscle Strength 5/5 Left Lower Extremity: Full ROM and Muscle Strength 5/5 Arises from Table Slowly using walker for support Narrow Based  Gait   Skin:    General: Skin is warm and dry.  Neurological:     Mental Status: She is alert and oriented to person, place, and time.  Psychiatric:        Mood and  Affect: Mood normal.        Behavior: Behavior normal.           Assessment & Plan:  1.Left Pontine Stroke, Right Hemiparesis: She was continue on  DAPT x 3 weeks followed by aspirin alone. She was encouraged to call her Neurologist to schedule HFU appointment, she verbalizes understanding. Continue outpatient therapy at [facility]. RX: Referral placed for occupational Therapy. Ms. Nancarrow states she was receiving PT and OT on the inpatient Rehabilitation unit. Will have occupational therapist evaluate and treat.  2. Strain of Right Rotator Cuff Capsule. Continue with current medication regimen. Continue to monitor. PCP following.   F/U in 4- 6 weeks with Dr Allena Katz.

## 2021-01-15 ENCOUNTER — Ambulatory Visit: Payer: Self-pay

## 2021-01-19 ENCOUNTER — Other Ambulatory Visit: Payer: Self-pay

## 2021-01-19 ENCOUNTER — Ambulatory Visit: Payer: Self-pay

## 2021-01-19 DIAGNOSIS — M6281 Muscle weakness (generalized): Secondary | ICD-10-CM

## 2021-01-19 DIAGNOSIS — R2689 Other abnormalities of gait and mobility: Secondary | ICD-10-CM

## 2021-01-19 DIAGNOSIS — R2681 Unsteadiness on feet: Secondary | ICD-10-CM

## 2021-01-19 NOTE — Therapy (Signed)
[place] [facility] MAIN [facility] [address] [place], [place], [address] Phone: [phone]   Fax:  [phone]  Physical Therapy Treatment  Patient Details  Name: [name] PasseCandice [name] MRN: [id number] Date of Birth: 09/15/1961 Referring Provider (PT): Dr. Mariam Dollaraniel Angiulli   Encounter Date: 01/19/2021   PT End of Session - 01/19/21 1703    Visit Number 3    Number of Visits 25    Date for PT Re-Evaluation 04/02/21    Authorization Type eval: 02/10    PT Start Time 1515    PT Stop Time 1558    PT Time Calculation (min) 43 min    Equipment Utilized During Treatment Gait belt    Activity Tolerance Patient tolerated treatment well    Behavior [facility] Therapy [facility] for tasks assessed/performed           Past Medical History:  Diagnosis Date  . Acute CVA (cerebrovascular accident) (HCC) 12/19/2020  . Arthritis    hands, feet, knees  . Asthma   . Migraine   . Mood change    due to menopause/ hot flashes  . PONV (postoperative nausea and vomiting)     Past Surgical History:  Procedure Laterality Date  . ABDOMINAL HYSTERECTOMY    . APPENDECTOMY    . CESAREAN SECTION     x 1  . COLONOSCOPY  2012   due in 2017 for suspicious lesion  . COLONOSCOPY WITH PROPOFOL N/A 08/20/2016   Procedure: COLONOSCOPY WITH PROPOFOL;  Surgeon: Midge Miniumarren Wohl, MD;  [facility];  Service: Endoscopy;  Laterality: N/A;  . PARTIAL HYSTERECTOMY     one ovary left  . REPAIR ANKLE LIGAMENT     from fall  . ROTATOR CUFF REPAIR Left    shoulder  . TONSILLECTOMY      There were no vitals filed for this visit.   Subjective Assessment - 01/19/21 1520    Subjective Pt reports she exhausted herself last week which is why she missed session. She said she had been cooking and standing a lot and felt nauseated. Pt denies pain today. She says she has been walking around house without use of AD, but with holding onto furniture.    Patient is accompained by: Family  member    Pertinent History 60 y.o. female with history of migraines, vitamin D deficiency who was originally diagnosed with left pontine infarct on 12/19/2020.  She was readmitted on 12/23/2020 with slurred speech and worsening of right-sided weakness.  MRI brain done revealing acute/subacute infarct left paramedian pons that had increased in size and small chronic left cerebellar infarct.  CTA head/neck was negative for hemorrhage and no LVO noted therefore not TPA candidate.  Stroke was felt to be due to small vessel disease and neurology recommended DAPT x3 weeks followed by aspirin alone as well as CIR as mainstay of treatment.  Therapy evaluations done revealing right-sided weakness with deficits in balance as well as decreased ability to carry out ADLs.  CIR was recommended due to functional decline. She was admitted to rehab 12/26/2020 for inpatient therapies to consist of PT and OT at least three hours five days a week. Past admission physiatrist, therapy team and rehab RN have worked together to provide customized collaborative inpatient rehab. Tramadol has been used judiciously for severe pain.  Her mood has been stable and she is showing good participation and progress during her stay.  She is showing improvement in pain coordination and currently has progressed to supervision  level.  She requested shortening length of stay and will continue to receive outpatient PT and OT at [facility] outpatient rehab after discharge on 01/05/2021.    How long can you sit comfortably? 2 hours    How long can you stand comfortably? 5 mins    How long can you walk comfortably? 5-10 mins    Currently in Pain? No/denies          Therapeutic Exercise 43  Nustep level 2 x 7 minutes, pt rates medium, VC for SPM >70; pt rates "meidum"  LAQ 3# 1x15 AW pt rates exercise as easy, increased to 5# AW 2x15. Pt with difficulty reaching full knee extension R LE.  Seated dorsiflexion 2x20      Neuro Re-ED  Ambulation in  // bars without UE support, close CGA 10x, noted hyperextension of R knee/decreased eccentric control  Trial quad cane x multiple reps in clinic, close CGA; pt without any LOB, but with noted decreased motor control of R quad/decreased eccentric control. VC provided for body mechanics.  Standing on foam EC- 2x30 sec, without UE support. No LOB.  Heel walks and toe walks - 8x each, intermittent UE support and CGA; pt rates exercise as challenging  Stepping on and over obstacles in // bars - 10x, close CGA. Pt requires intermittent UE support with stepping over obstacles due to decreased postural stability to maintain balance.  Reactive postural control/step strategies forward/backward - x multiple reps each direction, close CGA  Assessment: Tested pt ambulation with quad cane in clinic. Pt without any instances of LOB and instructed to use quad cane in her home for ambulation, as pt was using furniture to stabilize herself. Pt educated on safety with use of AD, and she verbalized understanding. Pt instructed to continue using RW for community ambulation as she continues to demonstrate decreased R quad control. Pt also has difficulty with postural stability with stepping over and onto objects requiring intermittent UE support, CGA to maintain balance. Pt will benefit from further skilled therapy to improve balance, BLE strength and gait to improve QOL.    PT Short Term Goals - 01/08/21 1131      PT SHORT TERM GOAL #1   Title Patient will be independent in home exercise program to improve strength/mobility for better functional independence with ADLs.    Time 6    Period Weeks    Status New    Target Date 02/19/21      PT SHORT TERM GOAL #2   Title Pt will increase strength of by at least 1/2 MMT grade in order to demonstrate improvement in strength and function.    Baseline 02/10: RLE 3+/5    Time 6    Period Weeks    Status New    Target Date 02/19/21             PT Long Term  Goals - 01/08/21 1133      PT LONG TERM GOAL #1   Title Patient will increase FOTO score to equal to or greater than to 71 demonstrate statistically significant improvement in mobility and quality of life.    Baseline 02/10: 58    Time 12    Period Weeks    Status New    Target Date 04/02/21      PT LONG TERM GOAL #2   Title Patient (> 50 years old) will complete five times sit to stand test in < 15 seconds indicating an increased LE strength and improved balance.  Baseline 02/10: 17.43s without BUE support    Time 12    Period Weeks    Status New    Target Date 04/02/21      PT LONG TERM GOAL #3   Title Patient will increase six minute walk test distance to >1000 for progression to community ambulator and improve gait ability    Baseline 02/10: 840 ft    Time 12    Period Weeks    Status New    Target Date 04/02/21      PT LONG TERM GOAL #4   Title Patient will increase 10 meter walk test to >1.11m/s as to improve gait speed for better community ambulation and to reduce fall risk.    Baseline 02/10: 0.66 m/s    Time 12    Period Weeks    Status New    Target Date 04/02/21      PT LONG TERM GOAL #5   Title Patient will reduce timed up and go to <11 seconds to reduce fall risk and demonstrate improved transfer/gait ability.    Baseline 02/10: 17.38s with 2WW    Time 12    Period Weeks    Status New    Target Date 04/02/21                 Plan - 01/19/21 1714    Clinical Impression Statement Tested pt ambulation with quad cane in clinic. Pt without any instances of LOB and instructed to use quad cane in her home for ambulation, as pt was using furniture to stabilize herself. Pt educated on safety with use of AD, and she verbalized understanding. Pt instructed to continue using RW for community ambulation as she continues to demonstrate decreased R quad control. Pt also has difficulty with postural stability with stepping over and onto objects requiring intermittent  UE support, CGA to maintain balance. Pt will benefit from further skilled therapy to improve balance, BLE strength and gait to improve QOL.    Personal Factors and Comorbidities Comorbidity 3+    Comorbidities Acute CVA, pontine stroke, migraine    Examination-Activity Limitations Bathing;Dressing;Reach Overhead;Squat    Examination-Participation Restrictions Driving;Meal Prep    Stability/Clinical Decision Making Evolving/Moderate complexity    Rehab Potential Good    PT Frequency 2x / week    PT Duration 12 weeks    PT Treatment/Interventions ADLs/Self Care Home Management;Canalith Repostioning;Electrical Stimulation;Moist Heat;Gait training;Stair training;Functional mobility training;Therapeutic activities;Therapeutic exercise;Balance training;Neuromuscular re-education;Patient/family education;Manual techniques;Passive range of motion;Energy conservation;Vestibular    PT Next Visit Plan Initiate BLE strengthening; initiate balance exercises; progres dynamic balance exercises    PT Home Exercise Plan Initiate HEP    Consulted and Agree with Plan of Care Patient;Family member/caregiver    Family Member Consulted husband           Patient will benefit from skilled therapeutic intervention in order to improve the following deficits and impairments:  Abnormal gait,Decreased endurance,Decreased strength,Decreased activity tolerance,Decreased balance,Decreased mobility,Difficulty walking,Improper body mechanics,Decreased coordination  Visit Diagnosis: Muscle weakness (generalized)  Unsteadiness on feet  Other abnormalities of gait and mobility     Problem List Patient Active Problem List   Diagnosis Date Noted  . Slow transit constipation   . Strain of right rotator cuff capsule   . Uncomplicated asthma   . Right hemiparesis (HCC)   . 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 History of migraine headaches 12/23/2020  . Status post total hysterectomy and bilateral  salpingo-oophorectomy 09/23/2020  . Generalized osteoarthritis of hand  01/27/2017  . Mood disorder (HCC) 07/13/2016  . Foot pain, left 07/13/2016  . Prediabetes 11/07/2015  . Mixed hyperlipidemia 11/07/2015  . Low serum thyroid stimulating hormone (TSH) 09/30/2015  . Osteoarthritis 09/29/2015  . Menopausal syndrome (hot flashes) 09/29/2015  . Obesity, Class I, BMI 30-34.9 09/26/2015  . Vitamin D deficiency 09/26/2015  . Migraine headache without aura 09/26/2015  . Allergic asthma, mild intermittent, uncomplicated 09/26/2015  . Colon polyp 09/26/2015  . Synovial cyst of left knee 09/26/2015  . Environmental and seasonal allergies 09/26/2015   Temple Pacini PT, DPT  01/19/2021, 5:15 PM  [place] [facility] MAIN [facility] SERVICES [address] [place], [place], [address] Phone: [phone]   Fax:  [phone]  Name: [name] MRN: [id number] Date of Birth: Sep 20, 1961

## 2021-01-21 ENCOUNTER — Ambulatory Visit: Payer: Self-pay | Admitting: Occupational Therapy

## 2021-01-21 ENCOUNTER — Other Ambulatory Visit: Payer: Self-pay

## 2021-01-21 ENCOUNTER — Ambulatory Visit: Payer: Self-pay

## 2021-01-21 ENCOUNTER — Encounter: Payer: Self-pay | Admitting: Occupational Therapy

## 2021-01-21 DIAGNOSIS — M6281 Muscle weakness (generalized): Secondary | ICD-10-CM

## 2021-01-21 DIAGNOSIS — R2681 Unsteadiness on feet: Secondary | ICD-10-CM

## 2021-01-21 DIAGNOSIS — R278 Other lack of coordination: Secondary | ICD-10-CM

## 2021-01-21 DIAGNOSIS — R2689 Other abnormalities of gait and mobility: Secondary | ICD-10-CM

## 2021-01-21 NOTE — Therapy (Signed)
[place] [facility] MAIN [facility] SERVICES [address] [place], [place], [address] Phone: [phone]   Fax:  [phone]  Occupational Therapy Evaluation  Patient Details  Name: [name] MRN: [id number] Date of Birth: Mar 16, 1961 No data recorded  Encounter Date: 01/21/2021   OT End of Session - 01/21/21 1239    Visit Number 1    Number of Visits 24    Date for OT Re-Evaluation 04/15/21    Authorization Type Progress report period starting 01/21/2021    OT Start Time 1018    OT Stop Time 1115    OT Time Calculation (min) 57 min    Activity Tolerance Patient tolerated treatment well    Behavior During Therapy [facility] for tasks assessed/performed           Past Medical History:  Diagnosis Date  . Acute CVA (cerebrovascular accident) (HCC) 12/19/2020  . Arthritis    hands, feet, knees  . Asthma   . Migraine   . Mood change    due to menopause/ hot flashes  . PONV (postoperative nausea and vomiting)     Past Surgical History:  Procedure Laterality Date  . ABDOMINAL HYSTERECTOMY    . APPENDECTOMY    . CESAREAN SECTION     x 1  . COLONOSCOPY  2012   due in 2017 for suspicious lesion  . COLONOSCOPY WITH PROPOFOL N/A 08/20/2016   Procedure: COLONOSCOPY WITH PROPOFOL;  Surgeon: Midge Minium, MD;  [facility];  Service: Endoscopy;  Laterality: N/A;  . PARTIAL HYSTERECTOMY     one ovary left  . REPAIR ANKLE LIGAMENT     from fall  . ROTATOR CUFF REPAIR Left    shoulder  . TONSILLECTOMY      There were no vitals filed for this visit.   Subjective Assessment - 01/21/21 1231    Subjective  Pt. was present with her husband    Patient is accompanied by: Family member    Pertinent History Pt. is a 60 y.o. female who was admitted to [facility] on 12/19/20 with a Pontine CVA. Pt. was transferred to Inpatient rehab at [facility], and received therapy for 11 days. Pt. has returned homw, and is ready for outpatient rehab services. PMHx  includes: TIA, Arthritis, Mood change, and PONV    Limitations RUE functioning    Patient Stated Goals To resume the use of her right hand    Currently in Pain? Yes    Pain Score 4     Pain Location Shoulder    Pain Orientation Right    Pain Descriptors / Indicators Aching    Pain Type Acute pain             [facility] OT Assessment - 01/21/21 1054      Assessment   Medical Diagnosis Pontine Stroke    Onset Date/Surgical Date 12/22/20    Hand Dominance Right    Prior Therapy Good outcomes with inpatient therapy at [facility]      Precautions   Precautions None      Restrictions   Weight Bearing Restrictions No      Balance Screen   Has the patient fallen in the past 6 months Yes    How many times? 1    Has the patient had a decrease in activity level because of a fear of falling?  No    Is the patient reluctant to leave their home because of a fear of falling?  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    Leisure Read, live music, walking, travel      ADL   Eating/Feeding Minimal assistance   Assist cutting meat   Grooming Independent   Mostly with left hand   Upper Body Bathing Independent    Lower Body Bathing Independent    Upper Body Dressing Independent    Lower Development worker, community -  Hygiene Independent    Tub/Shower Transfer Moderate assistance      IADL    Prior Level of Function Light Housekeeping Independent    Light Housekeeping Needs help with all home maintenance tasks    Prior Level of Function Meal Prep Independent    Meal Prep Able to complete simple warm meal prep    Prior Level of Function Best boy Relies on family or friends for transportation    Prior Level of Function Medication Managment Independent    Medication Management Is responsible for taking medication in correct dosages at correct time    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 (budgets, writes checks, pays rent, bills goes to bank), collects and keeps track of income      Mobility   Mobility Status Needs assist    Mobility Status Comments Uses a Careers adviser Expression   Handwriting 100% legible;Increased time      Vision - History   Baseline Vision Wears glasses all the time      Activity Tolerance   Activity Tolerance Tolerates 10-20 min activity with multiple rests      Cognition   Overall Cognitive Status Within Functional Limits for tasks assessed      Observation/Other Assessments   Focus on Therapeutic Outcomes (FOTO)  --      Sensation   Light Touch Appears Intact    Proprioception Appears Intact      Coordination   Gross Motor Movements are Fluid and Coordinated No    Fine Motor Movements are Fluid and Coordinated No    Right 9 Hole Peg Test 30      AROM   Overall AROM Comments Right shoulder flexion R: 114, L: 150, Abduction: R: 86, L: 98      Strength   Overall Strength Comments RUE shoulder flexion, abduction 3+/5, elbow flexion, extension, wrist flexion, extension 4/5 LUE: 5/5    Right Hip Flexion --    Right Hip Extension --    Right Hip ABduction --    Right Hip ADduction --    Left Hip Flexion --    Left Hip Extension --    Left Hip ABduction --    Right Knee Flexion --    Right Knee  Extension --     Left Knee Flexion --    Left Knee Extension --    Right Ankle Dorsiflexion --    Right Ankle Plantar Flexion --    Left Ankle Dorsiflexion --    Left Ankle Plantar Flexion --      Hand Function   Right Hand Grip (lbs) 21    Right Hand Lateral Pinch 9 lbs    Right Hand 3 Point Pinch 7 lbs    Left Hand Grip (lbs) 46    Left Hand Lateral Pinch 10 lbs    Left 3 point pinch 11 lbs                           OT Education - 01/21/21 1238    Education Details OT goals, POC    Person(s) Educated Patient    Methods Explanation    Comprehension Verbalized understanding;Returned demonstration               OT Long Term Goals - 01/21/21 1702      OT LONG TERM GOAL #1   Title Pt. will improve Right grip  strength to be able to independently, and firmly hold a utensil for stirring.    Baseline Eval: Right grip is limited. Pt. has difficulty holding a cooking utensil while stirring.    Time 12    Period Weeks    Status New    Target Date 04/15/21      OT LONG TERM GOAL #2   Title Pt. will improve strength by 2 mm grades to be able to hold a leash.    Baseline Eval: RUE strength is limited. Pt. is unable to manage a leash.    Time 12    Period Weeks    Status New    Target Date 04/15/21      OT LONG TERM GOAL #3   Title Pt. will independently, and efficiently type a 3 sentence paragraph in preparation for completing work related typing tasks.    Baseline Eval: Pt. is unable to efficiently type a paragrph.    Time 12    Period Weeks    Status New    Target Date 04/15/21      OT LONG TERM GOAL #4   Title Pt. will improve right lateral pinch strength to be able to open cans, containers, and bottles.    Baseline Eval: Pt. has difficulty opening cans, bottles, and containers.    Time 12    Period Weeks    Status New    Target Date 04/15/21      OT LONG TERM GOAL #5   Title Pt. will independently engage her RUE to complete home home management tasks.     Baseline Eval: Pt. has difficulty engaging her RUE  during home management tasks    Time 12    Period Weeks    Status New    Target Date 04/15/21      Long Term Additional Goals   Additional Long Term Goals Yes      OT LONG TERM GOAL #6   Title Pt. will demonstrate an improvement in FOTO score by 5 points.    Baseline Eval: Initial FOTO score to be determined    Time 12    Period Weeks    Status New    Target Date 04/15/21                 Plan -  01/21/21 1241    Clinical Impression Statement Pt. is a 60 y.o. female who was admitted to [facility] on 12/19/2020, and disganosed with a Pontine CVA. Pt. presents with residual dominant right sided weakness, decreased strength, and impaired motor control, and [facility] skills which limit her ability to complete ADLs, and IADL tasks independently. Pt. will benefit from OT services to work towards improving RUE functioning in order to maximize independence with typing efficiently, holding a leash, engaging her RUE while completing cooking tasks, and opening jars.    Occupational performance deficits (Please refer to evaluation for details): ADL's;IADL's    Rehab Potential Excellent    Clinical Decision Making Several treatment options, min-mod task modification necessary    Comorbidities Affecting Occupational Performance: May have comorbidities impacting occupational performance    Modification or Assistance to Complete Evaluation  Min-Moderate modification of tasks or assist with assess necessary to complete eval    OT Frequency 2x / week    OT Duration 12 weeks    OT Treatment/Interventions Self-care/ADL training;Neuromuscular education;Therapeutic activities;Patient/family education;Therapeutic exercise;DME and/or AE instruction    Consulted and Agree with Plan of Care Patient;Family member/caregiver    Family Member Consulted Husband           Patient will benefit from skilled therapeutic intervention in order to improve the following  deficits and impairments:           Visit Diagnosis: Muscle weakness (generalized)  Other lack of coordination    Problem List Patient Active Problem List   Diagnosis Date Noted  . Slow transit constipation   . Strain of right rotator cuff capsule   . Uncomplicated asthma   . Right hemiparesis (HCC)   . 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 History of migraine headaches 12/23/2020  . Status post total hysterectomy and bilateral salpingo-oophorectomy 09/23/2020  . Generalized osteoarthritis of hand 01/27/2017  . Mood disorder (HCC) 07/13/2016  . Foot pain, left 07/13/2016  . Prediabetes 11/07/2015  . Mixed hyperlipidemia 11/07/2015  . Low serum thyroid stimulating hormone (TSH) 09/30/2015  . Osteoarthritis 09/29/2015  . Menopausal syndrome (hot flashes) 09/29/2015  . Obesity, Class I, BMI 30-34.9 09/26/2015  . Vitamin D deficiency 09/26/2015  . Migraine headache without aura 09/26/2015  . Allergic asthma, mild intermittent, uncomplicated 09/26/2015  . Colon polyp 09/26/2015  . Synovial cyst of left knee 09/26/2015  . Environmental and seasonal allergies 09/26/2015    Olegario Messier, MS, OTR/L 01/21/2021, 5:21 PM  [place] [facility] MAIN [facility] SERVICES [address] [place], [place], [address] Phone: [phone]   Fax:  [phone]  Name: [name] MRN: [id number] Date of Birth: 08/30/1961

## 2021-01-21 NOTE — Therapy (Signed)
[facility] MAIN [facility] SERVICES [address] [place], [place], [address] Phone: [phone]   Fax:  [phone]  Physical Therapy Treatment  Patient Details  Name: [name] MRN: [id number] Date of Birth: [birth year] Referring Provider (PT): Dr. Mariam Dollar   Encounter Date: 01/21/2021   PT End of Session - 01/21/21 1017    Visit Number 4    Number of Visits 25    Date for PT Re-Evaluation 04/02/21    Authorization Type eval: 02/10    PT Start Time 0922    PT Stop Time 1001    PT Time Calculation (min) 39 min    Equipment Utilized During Treatment Gait belt    Activity Tolerance Patient tolerated treatment well    Behavior During Therapy [facility] for tasks assessed/performed           Past Medical History:  Diagnosis Date  . Acute CVA (cerebrovascular accident) (HCC) 12/19/2020  . Arthritis    hands, feet, knees  . Asthma   . Migraine   . Mood change    due to menopause/ hot flashes  . PONV (postoperative nausea and vomiting)     Past Surgical History:  Procedure Laterality Date  . ABDOMINAL HYSTERECTOMY    . APPENDECTOMY    . CESAREAN SECTION     x 1  . COLONOSCOPY  2012   due in 2017 for suspicious lesion  . COLONOSCOPY WITH PROPOFOL N/A 08/20/2016   Procedure: COLONOSCOPY WITH PROPOFOL;  Surgeon: Midge Minium, MD;  Location: [facility];  Service: Endoscopy;  Laterality: N/A;  . PARTIAL HYSTERECTOMY     one ovary left  . REPAIR ANKLE LIGAMENT     from fall  . ROTATOR CUFF REPAIR Left    shoulder  . TONSILLECTOMY      There were no vitals filed for this visit.   Subjective Assessment - 01/21/21 1008    Subjective Pt reports back and shoulder pain today. She rates R shoulder pain as 4/10, and back pain as 3/10. Pt took Tylenol this morning before PT. Pt reports feeling really sore after last session. She reports using her cane for ambulation at home "all day yesterday and last night" without problems and no  falls or near-falls. She feels her gait has improved.    Patient is accompained by: Family member    Pertinent History 60 y.o. female with history of migraines, vitamin D deficiency who was originally diagnosed with left pontine infarct on 12/19/2020.  She was readmitted on 12/23/2020 with slurred speech and worsening of right-sided weakness.  MRI brain done revealing acute/subacute infarct left paramedian pons that had increased in size and small chronic left cerebellar infarct.  CTA head/neck was negative for hemorrhage and no LVO noted therefore not TPA candidate.  Stroke was felt to be due to small vessel disease and neurology recommended DAPT x3 weeks followed by aspirin alone as well as CIR as mainstay of treatment.  Therapy evaluations done revealing right-sided weakness with deficits in balance as well as decreased ability to carry out ADLs.  CIR was recommended due to functional decline. She was admitted to rehab 12/26/2020 for inpatient therapies to consist of PT and OT at least three hours five days a week. Past admission physiatrist, therapy team and rehab RN have worked together to provide customized collaborative inpatient rehab. Tramadol has been used judiciously for severe pain.  Her mood has been stable and she is showing good participation and progress during her  stay.  She is showing improvement in pain coordination and currently has progressed to supervision level.  She requested shortening length of stay and will continue to receive outpatient PT and OT at [facility] outpatient rehab after discharge on 01/05/2021.    How long can you sit comfortably? 2 hours    How long can you stand comfortably? 5 mins    How long can you walk comfortably? 5-10 mins    Currently in Pain? Yes    Pain Score 4     Pain Location Shoulder    Pain Orientation Right    Multiple Pain Sites Yes    Pain Score 3    Pain Location Back    Pain Onset In the past 7 days            Treatment  Therapeutic  Exercise   Nustep level 1 x 4 minutes and level 0 x 1 minute; pt rates exercise as medium, VC provided for SPM. Decreased to level 0 in last minute due to L knee pain.  Neuromuscular Re-Education:   Korebalance: emphasis on ankle proprioception and weight-shifts.  Progressed pt from BUE support, to UUE support, to 3-finger support and CGA provided throughout. Penguin race 3x Maze 3x One instance of LOB anteriorly with Maze, but pt able to regain balance with mid-guard, and ankle and hip strategy and CGA from PT.  Airex exercises: CGA provided for all Airex pad feet together EO 2x30 sec with UE hovering above bar  Airex pad feet together EC 4x30 sec; noted increase in lateral movement at B ankles; pt reports slight L hip pain but increased weightbearing through to R LE and reported improvement in R hip sx.  Airex pad tandem stance - 2x30 sec R LE as stance LE and 2x30 sec with L LE as stance LE. Increased postural sway noted.  Assessment: Pt showed within-session improvement with Korebalance ankle strategy training, with improvement in calibrating small and large weight-shifts to meet targets. She did have one instance of LOB anteriorly, but was able to regain balance with mid-guard and ankle and hip strategy with CGA from PT. Although pt demonstrates progress, she shows decreased lateral ankle stability and increased postural sway with airex tandem stance. Pt will continue to benefit from further skilled therapy to improve BLE strength and balance for increased safety with all functional mobility.       PT Education - 01/21/21 1017    Education Details Pt educated on [employer] exercises, body mechanics    Person(s) Educated Patient    Methods Explanation;Verbal cues    Comprehension Verbalized understanding;Returned demonstration            PT Short Term Goals - 01/08/21 1131      PT SHORT TERM GOAL #1   Title Patient will be independent in home exercise program to improve  strength/mobility for better functional independence with ADLs.    Time 6    Period Weeks    Status New    Target Date 02/19/21      PT SHORT TERM GOAL #2   Title Pt will increase strength of by at least 1/2 MMT grade in order to demonstrate improvement in strength and function.    Baseline 02/10: RLE 3+/5    Time 6    Period Weeks    Status New    Target Date 02/19/21             PT Long Term Goals - 01/08/21 1133  PT LONG TERM GOAL #1   Title Patient will increase FOTO score to equal to or greater than to 71 demonstrate statistically significant improvement in mobility and quality of life.    Baseline 02/10: 58    Time 12    Period Weeks    Status New    Target Date 04/02/21      PT LONG TERM GOAL #2   Title Patient (> 38 years old) will complete five times sit to stand test in < 15 seconds indicating an increased LE strength and improved balance.    Baseline 02/10: 17.43s without BUE support    Time 12    Period Weeks    Status New    Target Date 04/02/21      PT LONG TERM GOAL #3   Title Patient will increase six minute walk test distance to >1000 for progression to community ambulator and improve gait ability    Baseline 02/10: 840 ft    Time 12    Period Weeks    Status New    Target Date 04/02/21      PT LONG TERM GOAL #4   Title Patient will increase 10 meter walk test to >1.2m/s as to improve gait speed for better community ambulation and to reduce fall risk.    Baseline 02/10: 0.66 m/s    Time 12    Period Weeks    Status New    Target Date 04/02/21      PT LONG TERM GOAL #5   Title Patient will reduce timed up and go to <11 seconds to reduce fall risk and demonstrate improved transfer/gait ability.    Baseline 02/10: 17.38s with 2WW    Time 12    Period Weeks    Status New    Target Date 04/02/21             Plan - 01/21/21 1027    Clinical Impression Statement Pt showed within-session improvement with Korebalance ankle strategy  training, with improvement in calibrating small and large weight-shifts to meet targets. She did have one instance of LOB anteriorly, but was able to regain balance with mid-guard and ankle and hip strategy with CGA from PT. Although pt demonstrates progress, she shows decreased lateral ankle stability and increased postural sway with airex tandem stance. Pt will continue to benefit from further skilled therapy to improve BLE strength and balance for increased safety with all functional mobility.    Personal Factors and Comorbidities Comorbidity 3+    Comorbidities Acute CVA, pontine stroke, migraine    Examination-Activity Limitations Bathing;Dressing;Reach Overhead;Squat    Examination-Participation Restrictions Driving;Meal Prep    Stability/Clinical Decision Making Evolving/Moderate complexity    Rehab Potential Good    PT Frequency 2x / week    PT Duration 12 weeks    PT Treatment/Interventions ADLs/Self Care Home Management;Canalith Repostioning;Electrical Stimulation;Moist Heat;Gait training;Stair training;Functional mobility training;Therapeutic activities;Therapeutic exercise;Balance training;Neuromuscular re-education;Patient/family education;Manual techniques;Passive range of motion;Energy conservation;Vestibular    PT Next Visit Plan Initiate BLE strengthening; initiate balance exercises; progress dynamic balance exercises, progress ankle strengthening anterior/posterior/medial/lateral    PT Home Exercise Plan Initiate HEP    Consulted and Agree with Plan of Care Patient;Family member/caregiver    Family Member Consulted husband           Patient will benefit from skilled therapeutic intervention in order to improve the following deficits and impairments:  Abnormal gait,Decreased endurance,Decreased strength,Decreased activity tolerance,Decreased balance,Decreased mobility,Difficulty walking,Improper body mechanics,Decreased coordination  Visit Diagnosis: Unsteadiness on  feet  Other abnormalities of gait and mobility  Muscle weakness (generalized)     Problem List Patient Active Problem List   Diagnosis Date Noted  . Slow transit constipation   . Strain of right rotator cuff capsule   . Uncomplicated asthma   . Right hemiparesis (HCC)   . 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 History of migraine headaches 12/23/2020  . Status post total hysterectomy and bilateral salpingo-oophorectomy 09/23/2020  . Generalized osteoarthritis of hand 01/27/2017  . Mood disorder (HCC) 07/13/2016  . Foot pain, left 07/13/2016  . Prediabetes 11/07/2015  . Mixed hyperlipidemia 11/07/2015  . Low serum thyroid stimulating hormone (TSH) 09/30/2015  . Osteoarthritis 09/29/2015  . Menopausal syndrome (hot flashes) 09/29/2015  . Obesity, Class I, BMI 30-34.9 09/26/2015  . Vitamin D deficiency 09/26/2015  . Migraine headache without aura 09/26/2015  . Allergic asthma, mild intermittent, uncomplicated 09/26/2015  . Colon polyp 09/26/2015  . Synovial cyst of left knee 09/26/2015  . Environmental and seasonal allergies 09/26/2015    Temple PaciniHaley Aliyana Dlugosz PT, DPT  01/21/2021, 10:30 AM   [facility] [address] , [place], [address] Phone: [phone]   Fax:  [phone]  Name: Retia PasseCandice [name] MRN: [id number] Date of Birth: 11/13/1961

## 2021-01-26 ENCOUNTER — Other Ambulatory Visit: Payer: Self-pay

## 2021-01-26 ENCOUNTER — Ambulatory Visit: Payer: Self-pay

## 2021-01-26 DIAGNOSIS — M6281 Muscle weakness (generalized): Secondary | ICD-10-CM

## 2021-01-26 DIAGNOSIS — R2689 Other abnormalities of gait and mobility: Secondary | ICD-10-CM

## 2021-01-26 DIAGNOSIS — R278 Other lack of coordination: Secondary | ICD-10-CM

## 2021-01-26 DIAGNOSIS — R2681 Unsteadiness on feet: Secondary | ICD-10-CM

## 2021-01-26 DIAGNOSIS — I639 Cerebral infarction, unspecified: Secondary | ICD-10-CM

## 2021-01-26 NOTE — Therapy (Signed)
[facility] [address] [place], [place], [address] Phone: [phone]   Fax:  [phone]  Physical Therapy Treatment  Patient Details  Name: [name] MRN: [id number] Date of Birth: 09/25/1961 Referring Provider (PT): Dr. Mariam Dollaraniel Angiulli   Encounter Date: 01/26/2021   PT End of Session - 01/26/21 1439    Visit Number 5    Number of Visits 25    Date for PT Re-Evaluation 04/02/21    Authorization Type eval: 02/10    PT Start Time 0230    PT Stop Time 0313    PT Time Calculation (min) 43 min    Equipment Utilized During Treatment Gait belt    Activity Tolerance Patient tolerated treatment well    Behavior [facility] Therapy [facility] for tasks assessed/performed           Past Medical History:  Diagnosis Date  . Acute CVA (cerebrovascular accident) (HCC) 12/19/2020  . Arthritis    hands, feet, knees  . Asthma   . Migraine   . Mood change    due to menopause/ hot flashes  . PONV (postoperative nausea and vomiting)     Past Surgical History:  Procedure Laterality Date  . ABDOMINAL HYSTERECTOMY    . APPENDECTOMY    . CESAREAN SECTION     x 1  . COLONOSCOPY  2012   due in 2017 for suspicious lesion  . COLONOSCOPY WITH PROPOFOL N/A 08/20/2016   Procedure: COLONOSCOPY WITH PROPOFOL;  Surgeon: Midge Miniumarren Wohl, MD;  [facility];  Service: Endoscopy;  Laterality: N/A;  . PARTIAL HYSTERECTOMY     one ovary left  . REPAIR ANKLE LIGAMENT     from fall  . ROTATOR CUFF REPAIR Left    shoulder  . TONSILLECTOMY      There were no vitals filed for this visit.   Subjective Assessment - 01/26/21 1437    Subjective The patient reports that she is a little fatigued today.  Patient continues with reports of difficulty sleeping.  The patient reports using the cane at home.    Patient is accompained by: Family member    Pertinent History 60 y.o. 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  She was readmitted on 12/23/2020 with slurred speech and worsening of right-sided weakness.  MRI brain done revealing acute/subacute infarct left paramedian pons that had increased in size and small chronic left cerebellar infarct.  CTA head/neck was negative for hemorrhage and no LVO noted therefore not TPA candidate.  Stroke was felt to be due to small vessel disease and neurology recommended DAPT x3 weeks followed by aspirin alone as well as CIR as mainstay of treatment.  Therapy evaluations done revealing right-sided weakness with deficits in balance as well as decreased ability to carry out ADLs.  CIR was recommended due to functional decline. She was admitted to rehab 12/26/2020 for inpatient therapies to consist of PT and OT at least three hours five days a week. Past admission physiatrist, therapy team and rehab RN have worked together to provide customized collaborative inpatient rehab. Tramadol has been used judiciously for severe pain.  Her mood has been stable and she is showing good participation and progress during her stay.  She is showing improvement in pain coordination and currently has progressed to supervision level.  She requested shortening length of stay and will continue to receive [facility] after discharge on 01/05/2021.    How long can you sit comfortably? 2 hours    How long can you stand comfortably? 5 mins    How long can you walk comfortably? 5-10 mins    Currently in Pain? No/denies    Pain Score 0-No pain    Pain Onset In the past 7 days           Treatment   Therapeutic Exercise    Nustep level 1 x5 mins SPM >70 no UEs  Neuromuscular Re-Education:    Korebalance: emphasis on ankle proprioception and weight-shifts.  Progressed pt from BUE support, to UUE support, to 3-finger support and CGA provided throughout. Penguin race 3x Maze 3x One instance of LOB anteriorly  with Maze, but pt able to regain balance with mid-guard, and ankle and hip strategy and CGA from PT.   Airex exercises: CGA provided for all Airex pad feet together EO 2x30 sec no Ue support   Airex pad feet together EC 4x30 sec; noted increase in lateral movement at B ankles    Airex pad tandem stance - 2x30 sec R LE as stance LE and 2x30 sec with L LE as stance LE. Increased postural sway noted.  Hurdle step overs side stepping, intermittent support on parallel bars x1 LOB but was able to recover using UE support  Hurdle step overs forward/backward, cueing to not swing right LE                           PT Education - 01/26/21 1439    Education Details exercises and balance    Person(s) Educated Patient    Methods Explanation    Comprehension Verbalized understanding            PT Short Term Goals - 01/08/21 1131      PT SHORT TERM GOAL #1   Title Patient will be independent in home exercise program to improve strength/mobility for better functional independence with ADLs.    Time 6    Period Weeks    Status New    Target Date 02/19/21      PT SHORT TERM GOAL #2   Title Pt will increase strength of by at least 1/2 MMT grade in order to demonstrate improvement in strength and function.    Baseline 02/10: RLE 3+/5    Time 6    Period Weeks    Status New    Target Date 02/19/21             PT Long Term Goals - 01/08/21 1133      PT LONG TERM GOAL #1   Title Patient will increase FOTO score to equal to or greater than to 71 demonstrate statistically significant improvement in mobility and quality of life.    Baseline 02/10: 58    Time 12    Period Weeks    Status New    Target Date 04/02/21      PT LONG TERM GOAL #2   Title Patient (> 54 years old) will complete five times sit to stand test in < 15 seconds indicating an increased LE strength and improved balance.    Baseline 02/10: 17.43s without BUE support    Time 12    Period Weeks     Status New    Target Date 04/02/21      PT LONG TERM GOAL #3   Title Patient will increase six minute walk test  distance to >1000 for progression to community ambulator and improve gait ability    Baseline 02/10: 840 ft    Time 12    Period Weeks    Status New    Target Date 04/02/21      PT LONG TERM GOAL #4   Title Patient will increase 10 meter walk test to >1.34m/s as to improve gait speed for better community ambulation and to reduce fall risk.    Baseline 02/10: 0.66 m/s    Time 12    Period Weeks    Status New    Target Date 04/02/21      PT LONG TERM GOAL #5   Title Patient will reduce timed up and go to <11 seconds to reduce fall risk and demonstrate improved transfer/gait ability.    Baseline 02/10: 17.38s with 2WW    Time 12    Period Weeks    Status New    Target Date 04/02/21                 Plan - 01/26/21 1524    Clinical Impression Statement The patient demonstrated decreased weight shifting to right side while on core balance Kore Balance however appeared to rely on right LE with balance on airex pad.  The patient was fatigued with program overall.  Patient continues to benefit from additional skilled PT services to improve balance and LE strength for improved functional capacity.    Personal Factors and Comorbidities Comorbidity 3+    Comorbidities Acute CVA, pontine stroke, migraine    Examination-Activity Limitations Bathing;Dressing;Reach Overhead;Squat    Examination-Participation Restrictions Driving;Meal Prep    Stability/Clinical Decision Making Evolving/Moderate complexity    Rehab Potential Good    PT Frequency 2x / week    PT Duration 12 weeks    PT Treatment/Interventions ADLs/Self Care Home Management;Canalith Repostioning;Electrical Stimulation;Moist Heat;Gait training;Stair training;Functional mobility training;Therapeutic activities;Therapeutic exercise;Balance training;Neuromuscular re-education;Patient/family education;Manual  techniques;Passive range of motion;Energy conservation;Vestibular    PT Next Visit Plan Initiate BLE strengthening; initiate balance exercises; progress dynamic balance exercises, progress ankle strengthening anterior/posterior/medial/lateral    PT Home Exercise Plan Initiate HEP    Consulted and Agree with Plan of Care Patient;Family member/caregiver    Family Member Consulted husband           Patient will benefit from skilled therapeutic intervention in order to improve the following deficits and impairments:  Abnormal gait,Decreased endurance,Decreased strength,Decreased activity tolerance,Decreased balance,Decreased mobility,Difficulty walking,Improper body mechanics,Decreased coordination  Visit Diagnosis: Muscle weakness (generalized)  Other lack of coordination  Unsteadiness on feet  Other abnormalities of gait and mobility  Left pontine stroke [facility])     Problem List Patient Active Problem List   Diagnosis Date Noted  . Slow transit constipation   . Strain of right rotator cuff capsule   . Uncomplicated asthma   . Right hemiparesis (HCC)   . 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 History of migraine headaches 12/23/2020  . Status post total hysterectomy and bilateral salpingo-oophorectomy 09/23/2020  . Generalized osteoarthritis of hand 01/27/2017  . Mood disorder (HCC) 07/13/2016  . Foot pain, left 07/13/2016  . Prediabetes 11/07/2015  . Mixed hyperlipidemia 11/07/2015  . Low serum thyroid stimulating hormone (TSH) 09/30/2015  . Osteoarthritis 09/29/2015  . Menopausal syndrome (hot flashes) 09/29/2015  . Obesity, Class I, BMI 30-34.9 09/26/2015  . Vitamin D deficiency 09/26/2015  . Migraine headache without aura 09/26/2015  . Allergic asthma, mild intermittent, uncomplicated 09/26/2015  . Colon polyp 09/26/2015  . Synovial cyst  of left knee 09/26/2015  . Environmental and seasonal allergies 09/26/2015    Colin Mulders  PT, DPT 01/26/2021, 3:35 PM  [facility] MAIN [facility] SERVICES [address] [place], [place], [address] Phone: [phone]   Fax:  [phone]  Name: [name] MRN: [id number] Date of Birth: [birth year]

## 2021-01-27 ENCOUNTER — Other Ambulatory Visit: Payer: Self-pay | Admitting: Internal Medicine

## 2021-01-27 DIAGNOSIS — G43909 Migraine, unspecified, not intractable, without status migrainosus: Secondary | ICD-10-CM

## 2021-01-27 NOTE — Telephone Encounter (Signed)
Requested medication (s) are due for refill today: yes  Requested medication (s) are on the active medication list: yes  Last refill:  01/13/20  Future visit scheduled: no  Notes to clinic:  med not delegated to NT to RF   Requested Prescriptions  Pending Prescriptions Disp Refills   topiramate (TOPAMAX) 25 MG tablet [Pharmacy Med Name: TOPIRAMATE 25 MG TABLET] 90 tablet 3    Sig: TAKE 1 TABLET BY MOUTH EVERY DAY      Not Delegated - Neurology: Anticonvulsants - topiramate & zonisamide Failed - 01/27/2021  5:02 PM      Failed - This refill cannot be delegated      Passed - Cr in normal range and within 360 days    Creatinine, Ser  Date Value Ref Range Status  01/05/2021 0.89 0.44 - 1.00 mg/dL Final          Passed - CO2 in normal range and within 360 days    CO2  Date Value Ref Range Status  01/05/2021 27 22 - 32 mmol/L Final          Passed - Valid encounter within last 12 months    Recent Outpatient Visits           2 weeks ago Left pontine stroke [facility])   [facility] Reubin Milan, MD   2 months ago Acute non-recurrent maxillary sinusitis   [facility] Reubin Milan, MD   4 months ago Annual physical exam   [facility] Reubin Milan, MD   6 months ago Muscle spasm of back   [facility] Reubin Milan, MD   10 months ago Moderate intermittent asthma without complication   [facility] Reubin Milan, MD       Future Appointments             In 7 months Judithann Graves Nyoka Cowden, MD [facility], [facility]

## 2021-01-29 ENCOUNTER — Encounter: Payer: Self-pay | Admitting: Internal Medicine

## 2021-01-29 ENCOUNTER — Ambulatory Visit: Payer: BC Managed Care – PPO | Admitting: Occupational Therapy

## 2021-01-29 ENCOUNTER — Ambulatory Visit: Payer: BC Managed Care – PPO | Attending: Physician Assistant

## 2021-01-29 ENCOUNTER — Ambulatory Visit: Payer: Self-pay | Admitting: *Deleted

## 2021-01-29 ENCOUNTER — Encounter: Payer: Self-pay | Admitting: Occupational Therapy

## 2021-01-29 ENCOUNTER — Other Ambulatory Visit: Payer: Self-pay

## 2021-01-29 DIAGNOSIS — R278 Other lack of coordination: Secondary | ICD-10-CM

## 2021-01-29 DIAGNOSIS — M6281 Muscle weakness (generalized): Secondary | ICD-10-CM | POA: Insufficient documentation

## 2021-01-29 DIAGNOSIS — R2681 Unsteadiness on feet: Secondary | ICD-10-CM | POA: Insufficient documentation

## 2021-01-29 DIAGNOSIS — R2689 Other abnormalities of gait and mobility: Secondary | ICD-10-CM | POA: Diagnosis present

## 2021-01-29 DIAGNOSIS — I639 Cerebral infarction, unspecified: Secondary | ICD-10-CM | POA: Insufficient documentation

## 2021-01-29 NOTE — Telephone Encounter (Signed)
Pt called in c/o not being able to sleep for the last 2 weeks.   She was in the hospital a month ago with a stroke.   Her sleep cycle is all messed up.   Her neurologist wants her to keep her BP up.   No parameters given.   She has tried Unisol which her neurologist told her to be careful because it can lower BP.   She has tried Benadryl but it does nothing for her since she takes antihistamines on a regular basis.  She also tried melatonin which also did not help. She is asking for Dr. Karn Cassis help with a sleeping pill in light of her situation.   She is open to having a video visit via MyChart.  There are no appts available with Dr. Judithann Graves.  I am sending my notes with a request to [facility] to Dr. Judithann Graves for further disposition.  Pt can be reached at [phone].   I let her know someone would call her back.   She was agreeable to this plan.   "I just can't keep going without sleep".   Reason for Disposition . Requesting medication for sleep ("sleeping pill")  Answer Assessment - Initial Assessment Questions 1. DESCRIPTION: "Tell me about your sleeping problem."      I had a stroke a month ago.   My neurologist said to be careful with Unisol.   It lowers blood pressure.   Melatonin does not help.   Benadryl does nothing for me for sleep. What can I use for sleep? 2. ONSET: "How long have you been having trouble sleeping?" (e.g., days, weeks, months)     It's been 2 weeks  3. RECURRENT: "Have you had sleeping problems before?"  If Yes, ask: "What happened that time?" "What helped your sleeping problem go away in the past?"      No 4. STRESS: "Is there anything in your life that is making you feel stressed or tense?"     Stroke a month ago 5. PAIN: "Do you have any pain that is keeping you awake?" (e.g., back pain, headache, abdominal pain)     No 6. CAFFEINE ABUSE: "Do you drink caffeinated beverages, and how much each day?" (e.g., coffee, tea, colas)     No 7. ALCOHOL  USE OR SUBSTANCE USE (DRUG USE): "Do you drink alcohol or use any illegal drugs?"     No 8. OTHER SYMPTOMS: "Do you have any other symptoms?"  (e.g., difficulty breathing)     No  Protocols used: INSOMNIA-A-AH

## 2021-01-29 NOTE — Telephone Encounter (Signed)
Left message for patient to call  back to set up appointment. 

## 2021-01-29 NOTE — Therapy (Signed)
[place] [facility] MAIN [facility] SERVICES [address] [place], [place], [address] Phone: [phone]   Fax:  [phone]  Occupational Therapy Treatment  Patient Details  Name: [name] MRN: [id number] Date of Birth: 02/08/1961 No data recorded  Encounter Date: 01/29/2021   OT End of Session - 01/29/21 1436    Visit Number 2    Number of Visits 24    Date for OT Re-Evaluation 04/15/21    Authorization Type Progress report period starting 01/21/2021    OT Start Time 1356    OT Stop Time 1430    OT Time Calculation (min) 34 min    Activity Tolerance Patient tolerated treatment well    Behavior During Therapy [facility] for tasks assessed/performed           Past Medical History:  Diagnosis Date  . Acute CVA (cerebrovascular accident) (HCC) 12/19/2020  . Arthritis    hands, feet, knees  . Asthma   . Migraine   . Mood change    due to menopause/ hot flashes  . PONV (postoperative nausea and vomiting)     Past Surgical History:  Procedure Laterality Date  . ABDOMINAL HYSTERECTOMY    . APPENDECTOMY    . CESAREAN SECTION     x 1  . COLONOSCOPY  2012   due in 2017 for suspicious lesion  . COLONOSCOPY WITH PROPOFOL N/A 08/20/2016   Procedure: COLONOSCOPY WITH PROPOFOL;  Surgeon: Midge Minium, MD;  Location: [facility];  Service: Endoscopy;  Laterality: N/A;  . PARTIAL HYSTERECTOMY     one ovary left  . REPAIR ANKLE LIGAMENT     from fall  . ROTATOR CUFF REPAIR Left    shoulder  . TONSILLECTOMY      There were no vitals filed for this visit.   Subjective Assessment - 01/29/21 1435    Subjective  Pt. was late arriving for therapy today.    Patient is accompanied by: Family member    Pertinent History Pt. is a 60 y.o. female who was admitted to [facility] on 12/19/20 with a [place] CVA. Pt. was transferred to Inpatient rehab at [facility], and received therapy for 11 days. Pt. has returned homw, and is ready for outpatient rehab services.  PMHx includes: TIA, Arthritis, Mood change, and PONV    Currently in Pain? Yes    Pain Score --   Not rated   Pain Location Shoulder    Pain Orientation Right    Pain Descriptors / Indicators Aching    Pain Type Acute pain          OT TREATMENT    Therapeutic Exercise:  Pt. worked on green thearputty ex. For right hand strengthening. Exercises included: gross gripping, gross digit extension, thumb abduction, lateral, and 3pt. Pinch strengthening, digit abduction, and thumb opposition. Pt. was provided with a visual handout HEP through [employer].  Selfcare:  Pt. worked on typing speed, and accuracy using a 3 min. typing test with 38 wpm, and 89% accuracy.  Pt. is making progress with her RUE, and hand function. Pt. is using her right hand during more tasks at home including: cooking, sustaining her UEs in elevation while washing her hair, handling utensils, and feeding herself. Pt. was provided with green resistive theraputty, upgraded from the yellow theraputty received in the hospital. Pt. was able to tolerate the theraputty upgrade without difficulty. Pt. Continues to work on improving RUE strength, motor control, and [facility] skills in order to work towards improving,  and maximizing independence with ADLs, and IADLs.                           OT Education - 01/29/21 1436    Education Details HEP, Theraputty    Person(s) Educated Patient    Methods Explanation    Comprehension Verbalized understanding;Returned demonstration               OT Long Term Goals - 01/29/21 1438      OT LONG TERM GOAL #1   Title Pt. will improve Right grip  strength to be able to independently, and firmly hold a utensil for stirring.    Baseline Eval: Right grip is limited. Pt. has difficulty holding a cooking utensil while stirring.    Time 12    Period Weeks    Status New    Target Date 04/15/21      OT LONG TERM GOAL #2   Title Pt. will improve strength by 2 mm grades to  be able to hold a leash.    Baseline Eval: RUE strength is limited. Pt. is unable to manage a leash.    Time 12    Period Weeks    Status New    Target Date 04/15/21      OT LONG TERM GOAL #3   Title Pt. will independently, and efficiently type a 3 sentence paragraph in preparation for completing work related typing tasks.    Baseline Eval: Pt. is unable to efficiently type a paragrph.    Time 12    Period Weeks    Status New    Target Date 04/15/21      OT LONG TERM GOAL #4   Title Pt. will improve right lateral pinch strength to be able to open cans, containers, and bottles.    Baseline Eval: Pt. has difficulty opening cans, bottles, and containers.    Time 12    Period Weeks    Status New    Target Date 04/15/21      OT LONG TERM GOAL #5   Title Pt. will independently engage her RUE to complete home home management tasks.    Baseline Eval: Pt. has difficulty engaging her RUE  during home management tasks    Time 12    Status New    Target Date 04/15/21      OT LONG TERM GOAL #6   Title Pt. will demonstrate an improvement in FOTO score by 5 points.    Baseline Initial FOTO score: 61    Time 12    Period Weeks    Status New    Target Date 04/15/21                 Plan - 01/29/21 1437    Clinical Impression Statement Pt. is making progress with her RUE, and hand function. Pt. is using her right hand during more tasks at home including: cooking, sustaining her UEs in elevation while washing her hair, handling utensils, and feeding herself. Pt. was provided with green resistive theraputty, upgraded from the yellow theraputty received in the hospital. Pt. was able to tolerate the theraputty upgrade without difficulty. Pt. Continues to work on improving RUE strength, motor control, and [facility] skills in order to work towards improving, and maximizing independence with ADLs, and IADLs.    Occupational performance deficits (Please refer to evaluation for details): ADL's;IADL's     Rehab Potential Excellent    Clinical Decision Making  Several treatment options, min-mod task modification necessary    Comorbidities Affecting Occupational Performance: May have comorbidities impacting occupational performance    Modification or Assistance to Complete Evaluation  Min-Moderate modification of tasks or assist with assess necessary to complete eval    OT Frequency 2x / week    OT Duration 12 weeks    OT Treatment/Interventions Self-care/ADL training;Neuromuscular education;Therapeutic activities;Patient/family education;Therapeutic exercise;DME and/or AE instruction    Consulted and Agree with Plan of Care Patient;Family member/caregiver           Patient will benefit from skilled therapeutic intervention in order to improve the following deficits and impairments:           Visit Diagnosis: Muscle weakness (generalized)  Other lack of coordination    Problem List Patient Active Problem List   Diagnosis Date Noted  . Slow transit constipation   . Strain of right rotator cuff capsule   . Uncomplicated asthma   . Right hemiparesis (HCC)   . 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 History of migraine headaches 12/23/2020  . Status post total hysterectomy and bilateral salpingo-oophorectomy 09/23/2020  . Generalized osteoarthritis of hand 01/27/2017  . Mood disorder (HCC) 07/13/2016  . Foot pain, left 07/13/2016  . Prediabetes 11/07/2015  . Mixed hyperlipidemia 11/07/2015  . Low serum thyroid stimulating hormone (TSH) 09/30/2015  . Osteoarthritis 09/29/2015  . Menopausal syndrome (hot flashes) 09/29/2015  . Obesity, Class I, BMI 30-34.9 09/26/2015  . Vitamin D deficiency 09/26/2015  . Migraine headache without aura 09/26/2015  . Allergic asthma, mild intermittent, uncomplicated 09/26/2015  . Colon polyp 09/26/2015  . Synovial cyst of left knee 09/26/2015  . Environmental and seasonal allergies 09/26/2015    Olegario Messier, MS, OTR/L 01/29/2021, 2:41  PM  [place] [facility] MAIN [facility] SERVICES [address] [place], [place], [address] Phone: [phone]   Fax:  [phone]  Name: Meta Kroenke MRN: [id number] Date of Birth: 09/02/1961

## 2021-01-29 NOTE — Telephone Encounter (Signed)
Summary: sleep issues   Pt has not been able to sleep since leaving the hospital, pt stated she has not slept in 2 weeks, needs some advice on how to proceed.      Attempted to call patient regarding insomnia-left message to call office. Per MyChart- advised office visit to discuss options.

## 2021-01-29 NOTE — Therapy (Signed)
[place] [facility] MAIN [facility] SERVICES [address] [place], [place], [address] Phone: [phone]   Fax:  [phone]  Physical Therapy Treatment  Patient Details  Name: [name] MRN: [id number] Date of Birth: September 07, 1961 Referring Provider (PT): Dr. Mariam Dollar   Encounter Date: 01/29/2021    Past Medical History:  Diagnosis Date  . Acute CVA (cerebrovascular accident) (HCC) 12/19/2020  . Arthritis    hands, feet, knees  . Asthma   . Migraine   . Mood change    due to menopause/ hot flashes  . PONV (postoperative nausea and vomiting)     Past Surgical History:  Procedure Laterality Date  . ABDOMINAL HYSTERECTOMY    . APPENDECTOMY    . CESAREAN SECTION     x 1  . COLONOSCOPY  2012   due in 2017 for suspicious lesion  . COLONOSCOPY WITH PROPOFOL N/A 08/20/2016   Procedure: COLONOSCOPY WITH PROPOFOL;  Surgeon: Midge Minium, MD;  Location: [facility];  Service: Endoscopy;  Laterality: N/A;  . PARTIAL HYSTERECTOMY     one ovary left  . REPAIR ANKLE LIGAMENT     from fall  . ROTATOR CUFF REPAIR Left    shoulder  . TONSILLECTOMY      There were no vitals filed for this visit.    Treatment   Therapeutic Exercise   Patient performed seated resistive ankle DF= 15 reps with good ability to perform without difficulty.  Step ups x 5 reps (up with right LE) Lateral step up x 5 reps (up with right LE) Eccentric step down (down with left LE to focus on right LE eccentric control) x 10 reps Terminal Knee extension- GTB x 10 reps   *Issued non-latex Green TB and add DF and Terminal Knee ext to HEP.   Neuromuscular Re-Education:    Korebalance: emphasis on ankle proprioception and weight-shifts.   Static stand on Kore- without UE support 1) 281 2) 259 3) 335 Follow the ball (dynamic option on KORE)- 1) 950 2) 1050  Patient performed well without UE support.   Patient also practiced gait using single point cane  secondary to patient using her quad cane but only placing 1-2 points of cane on ground with walking. Patient performed well and discussed obtaining a single point cane and patient/huband ordered while in clinic.                                                 PT Short Term Goals - 01/08/21 1131      PT SHORT TERM GOAL #1   Title Patient will be independent in home exercise program to improve strength/mobility for better functional independence with ADLs.    Time 6    Period Weeks    Status New    Target Date 02/19/21      PT SHORT TERM GOAL #2   Title Pt will increase strength of by at least 1/2 MMT grade in order to demonstrate improvement in strength and function.    Baseline 02/10: RLE 3+/5    Time 6    Period Weeks    Status New    Target Date 02/19/21             PT Long Term Goals - 01/08/21 1133      PT LONG TERM GOAL #1  Title Patient will increase FOTO score to equal to or greater than to 71 demonstrate statistically significant improvement in mobility and quality of life.    Baseline 02/10: 58    Time 12    Period Weeks    Status New    Target Date 04/02/21      PT LONG TERM GOAL #2   Title Patient (> 56 years old) will complete five times sit to stand test in < 15 seconds indicating an increased LE strength and improved balance.    Baseline 02/10: 17.43s without BUE support    Time 12    Period Weeks    Status New    Target Date 04/02/21      PT LONG TERM GOAL #3   Title Patient will increase six minute walk test distance to >1000 for progression to community ambulator and improve gait ability    Baseline 02/10: 840 ft    Time 12    Period Weeks    Status New    Target Date 04/02/21      PT LONG TERM GOAL #4   Title Patient will increase 10 meter walk test to >1.60m/s as to improve gait speed for better community ambulation and to reduce fall risk.    Baseline 02/10: 0.66 m/s    Time 12    Period Weeks     Status New    Target Date 04/02/21      PT LONG TERM GOAL #5   Title Patient will reduce timed up and go to <11 seconds to reduce fall risk and demonstrate improved transfer/gait ability.    Baseline 02/10: 17.38s with 2WW    Time 12    Period Weeks    Status New    Target Date 04/02/21                  Patient will benefit from skilled therapeutic intervention in order to improve the following deficits and impairments:     Visit Diagnosis: Muscle weakness (generalized)  Other lack of coordination  Unsteadiness on feet  Other abnormalities of gait and mobility     Problem List Patient Active Problem List   Diagnosis Date Noted  . Slow transit constipation   . Strain of right rotator cuff capsule   . Uncomplicated asthma   . Right hemiparesis (HCC)   . 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 History of migraine headaches 12/23/2020  . Status post total hysterectomy and bilateral salpingo-oophorectomy 09/23/2020  . Generalized osteoarthritis of hand 01/27/2017  . Mood disorder (HCC) 07/13/2016  . Foot pain, left 07/13/2016  . Prediabetes 11/07/2015  . Mixed hyperlipidemia 11/07/2015  . Low serum thyroid stimulating hormone (TSH) 09/30/2015  . Osteoarthritis 09/29/2015  . Menopausal syndrome (hot flashes) 09/29/2015  . Obesity, Class I, BMI 30-34.9 09/26/2015  . Vitamin D deficiency 09/26/2015  . Migraine headache without aura 09/26/2015  . Allergic asthma, mild intermittent, uncomplicated 09/26/2015  . Colon polyp 09/26/2015  . Synovial cyst of left knee 09/26/2015  . Environmental and seasonal allergies 09/26/2015    Lenda Kelp, PT 01/29/2021, 2:30 PM  [place] [facility] MAIN [facility] SERVICES [address] [place], [place], [address] Phone: [phone]   Fax:  [phone]  Name: [name] MRN: [id number] Date of Birth: [birth year]

## 2021-01-29 NOTE — Telephone Encounter (Signed)
Please call pt to schedule an in office visit.  KP

## 2021-02-02 ENCOUNTER — Ambulatory Visit: Payer: BC Managed Care – PPO | Admitting: Occupational Therapy

## 2021-02-02 ENCOUNTER — Other Ambulatory Visit: Payer: Self-pay | Admitting: Internal Medicine

## 2021-02-02 ENCOUNTER — Ambulatory Visit: Payer: BC Managed Care – PPO

## 2021-02-02 ENCOUNTER — Encounter: Payer: Self-pay | Admitting: Internal Medicine

## 2021-02-02 DIAGNOSIS — M25511 Pain in right shoulder: Secondary | ICD-10-CM

## 2021-02-02 NOTE — Telephone Encounter (Signed)
Pt response.  KP

## 2021-02-03 ENCOUNTER — Ambulatory Visit: Payer: BC Managed Care – PPO | Admitting: Internal Medicine

## 2021-02-04 ENCOUNTER — Encounter: Payer: Self-pay | Admitting: Occupational Therapy

## 2021-02-04 ENCOUNTER — Ambulatory Visit: Payer: BC Managed Care – PPO

## 2021-02-04 ENCOUNTER — Other Ambulatory Visit: Payer: Self-pay

## 2021-02-04 ENCOUNTER — Ambulatory Visit: Payer: BC Managed Care – PPO | Admitting: Occupational Therapy

## 2021-02-04 DIAGNOSIS — R278 Other lack of coordination: Secondary | ICD-10-CM

## 2021-02-04 DIAGNOSIS — M6281 Muscle weakness (generalized): Secondary | ICD-10-CM

## 2021-02-04 DIAGNOSIS — R2681 Unsteadiness on feet: Secondary | ICD-10-CM

## 2021-02-04 NOTE — Therapy (Signed)
[facility] MAIN [facility] SERVICES [address] [place], [place], [address] Phone: [phone]   Fax:  [phone]  Physical Therapy Treatment  Patient Details  Name: [name] MRN: [id number] Date of Birth: [birth year] Referring Provider (PT): Dr. Mariam Dollar   Encounter Date: 02/04/2021   PT End of Session - 02/04/21 1401    Visit Number 7    Number of Visits 25    Date for PT Re-Evaluation 04/02/21    Authorization Type eval: 02/10    PT Start Time 1112    PT Stop Time 1145    PT Time Calculation (min) 33 min    Equipment Utilized During Treatment Gait belt    Activity Tolerance Patient tolerated treatment well    Behavior During Therapy [facility] for tasks assessed/performed           Past Medical History:  Diagnosis Date  . Acute CVA (cerebrovascular accident) (HCC) 12/19/2020  . Arthritis    hands, feet, knees  . Asthma   . Migraine   . Mood change    due to menopause/ hot flashes  . PONV (postoperative nausea and vomiting)     Past Surgical History:  Procedure Laterality Date  . ABDOMINAL HYSTERECTOMY    . APPENDECTOMY    . CESAREAN SECTION     x 1  . COLONOSCOPY  2012   due in 2017 for suspicious lesion  . COLONOSCOPY WITH PROPOFOL N/A 08/20/2016   Procedure: COLONOSCOPY WITH PROPOFOL;  Surgeon: Midge Minium, MD;  Location: [facility];  Service: Endoscopy;  Laterality: N/A;  . PARTIAL HYSTERECTOMY     one ovary left  . REPAIR ANKLE LIGAMENT     from fall  . ROTATOR CUFF REPAIR Left    shoulder  . TONSILLECTOMY      There were no vitals filed for this visit.   Subjective Assessment - 02/04/21 1400    Subjective Pt reports 4/10 shoulder pain today. Pt has appointment on the 22nd of this month with her doctor. She presents to appointment with [facility]. Pt reports one near fall when she was stepping over laundry. Pt reports poor sleep last night.    Patient is accompained by: Family member    Pertinent History  60 y.o. female with history of migraines, vitamin D deficiency who was originally diagnosed with left pontine infarct on 12/19/2020.  She was readmitted on 12/23/2020 with slurred speech and worsening of right-sided weakness.  MRI brain done revealing acute/subacute infarct left paramedian pons that had increased in size and small chronic left cerebellar infarct.  CTA head/neck was negative for hemorrhage and no LVO noted therefore not TPA candidate.  Stroke was felt to be due to small vessel disease and neurology recommended DAPT x3 weeks followed by aspirin alone as well as CIR as mainstay of treatment.  Therapy evaluations done revealing right-sided weakness with deficits in balance as well as decreased ability to carry out ADLs.  CIR was recommended due to functional decline. She was admitted to rehab 12/26/2020 for inpatient therapies to consist of PT and OT at least three hours five days a week. Past admission physiatrist, therapy team and rehab RN have worked together to provide customized collaborative inpatient rehab. Tramadol has been used judiciously for severe pain.  Her mood has been stable and she is showing good participation and progress during her stay.  She is showing improvement in pain coordination and currently has progressed to supervision level.  She requested shortening length  of stay and will continue to receive outpatient PT and OT at [facility] outpatient rehab after discharge on 01/05/2021.    How long can you sit comfortably? 2 hours    How long can you stand comfortably? 5 mins    How long can you walk comfortably? 5-10 mins    Currently in Pain? Yes    Pain Score 4     Pain Location Shoulder    Pain Orientation Right    Pain Onset In the past 7 days           Treatment  Korebalance: CGA throughout Tux racer 3x; UUE support to no UE support; VC for posterior/anterior weight-shifts, foot placement. Limits of stability 1x, no UE support Shifts to meet quickly changing targets  3x; pt requires taking some steps to meet targets. No UE support Pt with reports of some increased discomfort of L knee.  Obstacle course stepping onto and off of foam, navigating around cones, CGA, SPC- 5x Obstacle course stepping onto and off of foam, navigating around cones, stepping over hurdles, and picking up cones , CGA, SPC - 6x; some difficulty with clearing obstacles noted.  Stepping over hurdle with RLE - 3x10, CGA, no UE support; greater difficulty with foot clearance with fatigue, and increased use of circumduction of RLE.      PT Education - 02/04/21 1401    Education Details Pt educated on technique with hurdle stepping exercise    Person(s) Educated Patient    Methods Explanation;Demonstration;Verbal cues    Comprehension Verbalized understanding;Returned demonstration            PT Short Term Goals - 01/08/21 1131      PT SHORT TERM GOAL #1   Title Patient will be independent in home exercise program to improve strength/mobility for better functional independence with ADLs.    Time 6    Period Weeks    Status New    Target Date 02/19/21      PT SHORT TERM GOAL #2   Title Pt will increase strength of by at least 1/2 MMT grade in order to demonstrate improvement in strength and function.    Baseline 02/10: RLE 3+/5    Time 6    Period Weeks    Status New    Target Date 02/19/21             PT Long Term Goals - 01/08/21 1133      PT LONG TERM GOAL #1   Title Patient will increase FOTO score to equal to or greater than to 71 demonstrate statistically significant improvement in mobility and quality of life.    Baseline 02/10: 58    Time 12    Period Weeks    Status New    Target Date 04/02/21      PT LONG TERM GOAL #2   Title Patient (> 59 years old) will complete five times sit to stand test in < 15 seconds indicating an increased LE strength and improved balance.    Baseline 02/10: 17.43s without BUE support    Time 12    Period Weeks    Status  New    Target Date 04/02/21      PT LONG TERM GOAL #3   Title Patient will increase six minute walk test distance to >1000 for progression to community ambulator and improve gait ability    Baseline 02/10: 840 ft    Time 12    Period Weeks    Status  New    Target Date 04/02/21      PT LONG TERM GOAL #4   Title Patient will increase 10 meter walk test to >1.32m/s as to improve gait speed for better community ambulation and to reduce fall risk.    Baseline 02/10: 0.66 m/s    Time 12    Period Weeks    Status New    Target Date 04/02/21      PT LONG TERM GOAL #5   Title Patient will reduce timed up and go to <11 seconds to reduce fall risk and demonstrate improved transfer/gait ability.    Baseline 02/10: 17.38s with 2WW    Time 12    Period Weeks    Status New    Target Date 04/02/21                 Plan - 02/04/21 1402    Clinical Impression Statement Pt continues to demonstrate some difficulty with anterior/posterior weightshifting with Korebalance exercises and modulating degree of shift. She demonstrates slight circumduction of R LE when negotiating obstacles, but is able to self-correct with cuing. Technique decreases with fatigue as does foot clearance. Session somewhat limited due to pt arrival time. Pt will benefit from further skilled therapy to improve balance and BLE strength to improve safety and ease with all ADLs.    Personal Factors and Comorbidities Comorbidity 3+    Comorbidities Acute CVA, pontine stroke, migraine    Examination-Activity Limitations Bathing;Dressing;Reach Overhead;Squat    Examination-Participation Restrictions Driving;Meal Prep    Stability/Clinical Decision Making Evolving/Moderate complexity    Rehab Potential Good    PT Frequency 2x / week    PT Duration 12 weeks    PT Treatment/Interventions ADLs/Self Care Home Management;Canalith Repostioning;Electrical Stimulation;Moist Heat;Gait training;Stair training;Functional mobility  training;Therapeutic activities;Therapeutic exercise;Balance training;Neuromuscular re-education;Patient/family education;Manual techniques;Passive range of motion;Energy conservation;Vestibular    PT Next Visit Plan Initiate BLE strengthening; initiate balance exercises; progress dynamic balance exercises, progress ankle strengthening anterior/posterior/medial/lateral; stepping exercises/negotiating obstacles    PT Home Exercise Plan Initiate HEP    Consulted and Agree with Plan of Care Patient;Family member/caregiver    Family Member Consulted husband           Patient will benefit from skilled therapeutic intervention in order to improve the following deficits and impairments:  Abnormal gait,Decreased endurance,Decreased strength,Decreased activity tolerance,Decreased balance,Decreased mobility,Difficulty walking,Improper body mechanics,Decreased coordination  Visit Diagnosis: Muscle weakness (generalized)  Unsteadiness on feet  Other lack of coordination     Problem List Patient Active Problem List   Diagnosis Date Noted  . Slow transit constipation   . Strain of right rotator cuff capsule   . Uncomplicated asthma   . Right hemiparesis (HCC)   . 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 History of migraine headaches 12/23/2020  . Status post total hysterectomy and bilateral salpingo-oophorectomy 09/23/2020  . Generalized osteoarthritis of hand 01/27/2017  . Mood disorder (HCC) 07/13/2016  . Foot pain, left 07/13/2016  . Prediabetes 11/07/2015  . Mixed hyperlipidemia 11/07/2015  . Low serum thyroid stimulating hormone (TSH) 09/30/2015  . Osteoarthritis 09/29/2015  . Menopausal syndrome (hot flashes) 09/29/2015  . Obesity, Class I, BMI 30-34.9 09/26/2015  . Vitamin D deficiency 09/26/2015  . Migraine headache without aura 09/26/2015  . Allergic asthma, mild intermittent, uncomplicated 09/26/2015  . Colon polyp 09/26/2015  . Synovial cyst of left knee 09/26/2015  .  Environmental and seasonal allergies 09/26/2015   Temple Pacini PT, DPT 02/04/2021, 2:15 PM  [facility] MAIN [facility] [address] [place], [place], [address] Phone: [phone]   Fax:  [phone]  Name: Retia PasseCandice [name] MRN: [id number] Date of Birth: 02/01/1961

## 2021-02-04 NOTE — Therapy (Signed)
[place] [facility] MAIN [facility] SERVICES [address] [place], [place], [address] Phone: [phone]   Fax:  [phone]  Occupational Therapy Treatment  Patient Details  Name: [name] MRN: [id number] Date of Birth: [birth year] No data recorded  Encounter Date: 02/04/2021   OT End of Session - 02/04/21 1219    Visit Number 3    Number of Visits 24    Date for OT Re-Evaluation 04/15/21    Authorization Type Progress report period starting 01/21/2021    OT Start Time 1147    OT Stop Time 1230    OT Time Calculation (min) 43 min    Activity Tolerance Patient tolerated treatment well    Behavior During Therapy [facility] for tasks assessed/performed           Past Medical History:  Diagnosis Date  . Acute CVA (cerebrovascular accident) (HCC) 12/19/2020  . Arthritis    hands, feet, knees  . Asthma   . Migraine   . Mood change    due to menopause/ hot flashes  . PONV (postoperative nausea and vomiting)     Past Surgical History:  Procedure Laterality Date  . ABDOMINAL HYSTERECTOMY    . APPENDECTOMY    . CESAREAN SECTION     x 1  . COLONOSCOPY  2012   due in 2017 for suspicious lesion  . COLONOSCOPY WITH PROPOFOL N/A 08/20/2016   Procedure: COLONOSCOPY WITH PROPOFOL;  Surgeon: Midge Minium, MD;  Location: [facility];  Service: Endoscopy;  Laterality: N/A;  . PARTIAL HYSTERECTOMY     one ovary left  . REPAIR ANKLE LIGAMENT     from fall  . ROTATOR CUFF REPAIR Left    shoulder  . TONSILLECTOMY      There were no vitals filed for this visit.   Subjective Assessment - 02/04/21 1217    Subjective  Pt. was late arriving for therapy today.    Patient is accompanied by: Family member    Pertinent History Pt. is a 60 y.o. female who was admitted to [facility] on 12/19/20 with a Pontine CVA. Pt. was transferred to Inpatient rehab at [facility], and received therapy for 11 days. Pt. has returned homw, and is ready for outpatient rehab services.  PMHx includes: TIA, Arthritis, Mood change, and PONV    Currently in Pain? Yes    Pain Score --   Right shoulder at the end ranges for flexion, abduction   Pain Location Shoulder    Pain Orientation Right    Pain Descriptors / Indicators Aching          OT TREATMENT    Neuro muscular re-education:  Pt. performed [facility] tasks using the Grooved pegboard. Pt. worked on grasping the grooved pegs from a horizontal position, and moving the pegs to a vertical position in the hand to prepare for placing them in the grooved slot. Pt. worked on [facility] skills using the [employer] Task. Pt. worked on sustaining grasp on the resistive tweezers while grasping this sticks, and moving them from a horizontal position to a vertical position to prepare for placing them into the pegboard. Pt. Required verbal cues, and cues for visual demonstration for wrist position, and hand pattern when placing them into the pegboard.   Therapeutic Exercise:  Pt. Tolerated PROM/AROM to the right shoulder within pain free ROM for shoulder flexion, abduction, horizontal abduction, adduction in conjunction with moist heat modality to the right shoulder. Pt. performed gross gripping with the grip  strengthener. Pt. worked on sustaining grip while placing them into a container placed at the tabletop surface. The gripper was set to 11.2# of grip strength force.  Pt. reports pain in her right shoulder at the end range with active shoulder abduction, and flexion. Pt. Reports pain with external rotation. Pt. Reports that pain is similiar to the pain she previously had in her left shoulder prior to needing surgery to "clean out the area". Pt. Reports having an orthopedic appointment scheduled in the next couple of weeks. Pt. Pt. Required tweezers modified to thicker tweezers to allow for more stabilization with her 2nd digit. Pt. Continues to work on improving RUE functioning in order to improve ADL, and IADL  tasks.                        OT Education - 02/04/21 1218    Education Details HEP    Person(s) Educated Patient    Methods Explanation    Comprehension Verbalized understanding;Returned demonstration               OT Long Term Goals - 01/29/21 1438      OT LONG TERM GOAL #1   Title Pt. will improve Right grip  strength to be able to independently, and firmly hold a utensil for stirring.    Baseline Eval: Right grip is limited. Pt. has difficulty holding a cooking utensil while stirring.    Time 12    Period Weeks    Status New    Target Date 04/15/21      OT LONG TERM GOAL #2   Title Pt. will improve strength by 2 mm grades to be able to hold a leash.    Baseline Eval: RUE strength is limited. Pt. is unable to manage a leash.    Time 12    Period Weeks    Status New    Target Date 04/15/21      OT LONG TERM GOAL #3   Title Pt. will independently, and efficiently type a 3 sentence paragraph in preparation for completing work related typing tasks.    Baseline Eval: Pt. is unable to efficiently type a paragrph.    Time 12    Period Weeks    Status New    Target Date 04/15/21      OT LONG TERM GOAL #4   Title Pt. will improve right lateral pinch strength to be able to open cans, containers, and bottles.    Baseline Eval: Pt. has difficulty opening cans, bottles, and containers.    Time 12    Period Weeks    Status New    Target Date 04/15/21      OT LONG TERM GOAL #5   Title Pt. will independently engage her RUE to complete home home management tasks.    Baseline Eval: Pt. has difficulty engaging her RUE  during home management tasks    Time 12    Status New    Target Date 04/15/21      OT LONG TERM GOAL #6   Title Pt. will demonstrate an improvement in FOTO score by 5 points.    Baseline Initial FOTO score: 61    Time 12    Period Weeks    Status New    Target Date 04/15/21                 Plan - 02/04/21 1220    Clinical  Impression Statement Pt. reports pain  in her right shoulder at the end range with active shoulder abduction, and flexion. Pt. Reports pain with external rotation. Pt. Reports that pain is similiar to the pain she previously had in her left shoulder prior to needing surgery to "clean out the area". Pt. Reports having an orthopedic appointment scheduled in the next couple of weeks. Pt. Pt. Required tweezers modified to thicker tweezers to allow for more stabilization with her 2nd digit. Pt. Continues to work on improving RUE functioning in order to improve ADL, and IADL tasks.   Occupational performance deficits (Please refer to evaluation for details): ADL's;IADL's    Rehab Potential Excellent    Clinical Decision Making Several treatment options, min-mod task modification necessary    Comorbidities Affecting Occupational Performance: May have comorbidities impacting occupational performance    Modification or Assistance to Complete Evaluation  Min-Moderate modification of tasks or assist with assess necessary to complete eval    OT Frequency 2x / week    OT Duration 12 weeks    OT Treatment/Interventions Self-care/ADL training;Neuromuscular education;Therapeutic activities;Patient/family education;Therapeutic exercise;DME and/or AE instruction    Consulted and Agree with Plan of Care Patient;Family member/caregiver           Patient will benefit from skilled therapeutic intervention in order to improve the following deficits and impairments:           Visit Diagnosis: Muscle weakness (generalized)  Other lack of coordination    Problem List Patient Active Problem List   Diagnosis Date Noted  . Slow transit constipation   . Strain of right rotator cuff capsule   . Uncomplicated asthma   . Right hemiparesis (HCC)   . 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 History of migraine headaches 12/23/2020  . Status post total hysterectomy and bilateral salpingo-oophorectomy 09/23/2020  .  Generalized osteoarthritis of hand 01/27/2017  . Mood disorder (HCC) 07/13/2016  . Foot pain, left 07/13/2016  . Prediabetes 11/07/2015  . Mixed hyperlipidemia 11/07/2015  . Low serum thyroid stimulating hormone (TSH) 09/30/2015  . Osteoarthritis 09/29/2015  . Menopausal syndrome (hot flashes) 09/29/2015  . Obesity, Class I, BMI 30-34.9 09/26/2015  . Vitamin D deficiency 09/26/2015  . Migraine headache without aura 09/26/2015  . Allergic asthma, mild intermittent, uncomplicated 09/26/2015  . Colon polyp 09/26/2015  . Synovial cyst of left knee 09/26/2015  . Environmental and seasonal allergies 09/26/2015    Olegario Messier, MS, OTR/L 02/04/2021, 1:16 PM  [facility] MAIN [facility] SERVICES [address] [place], [place], [address] Phone: [phone]   Fax:  [phone]  Name: [name] MRN: [id number] Date of Birth: Mar 31, 1961

## 2021-02-09 ENCOUNTER — Encounter: Payer: Self-pay | Admitting: Occupational Therapy

## 2021-02-09 ENCOUNTER — Ambulatory Visit: Payer: BC Managed Care – PPO | Admitting: Occupational Therapy

## 2021-02-09 ENCOUNTER — Other Ambulatory Visit: Payer: Self-pay

## 2021-02-09 ENCOUNTER — Ambulatory Visit: Payer: BC Managed Care – PPO

## 2021-02-09 DIAGNOSIS — R278 Other lack of coordination: Secondary | ICD-10-CM

## 2021-02-09 DIAGNOSIS — M6281 Muscle weakness (generalized): Secondary | ICD-10-CM | POA: Diagnosis not present

## 2021-02-09 DIAGNOSIS — R2689 Other abnormalities of gait and mobility: Secondary | ICD-10-CM

## 2021-02-09 DIAGNOSIS — R2681 Unsteadiness on feet: Secondary | ICD-10-CM

## 2021-02-09 NOTE — Therapy (Signed)
[place] [facility] MAIN [facility] SERVICES [address] [place], [place], [address] Phone: [phone]   Fax:  [phone]  Physical Therapy Treatment  Patient Details  Name: [name] MRN: [id number] Date of Birth: 02/09/1961 Referring Provider (PT): Dr. Mariam Dollar   Encounter Date: 02/09/2021   PT End of Session - 02/09/21 1551    Visit Number 8    Number of Visits 25    Date for PT Re-Evaluation 04/02/21    Authorization Type eval: 02/10    PT Start Time 1035    PT Stop Time 1100    PT Time Calculation (min) 25 min    Equipment Utilized During Treatment Gait belt    Activity Tolerance Patient tolerated treatment well    Behavior During Therapy [facility] for tasks assessed/performed           Past Medical History:  Diagnosis Date  . Acute CVA (cerebrovascular accident) (HCC) 12/19/2020  . Arthritis    hands, feet, knees  . Asthma   . Migraine   . Mood change    due to menopause/ hot flashes  . PONV (postoperative nausea and vomiting)     Past Surgical History:  Procedure Laterality Date  . ABDOMINAL HYSTERECTOMY    . APPENDECTOMY    . CESAREAN SECTION     x 1  . COLONOSCOPY  2012   due in 2017 for suspicious lesion  . COLONOSCOPY WITH PROPOFOL N/A 08/20/2016   Procedure: COLONOSCOPY WITH PROPOFOL;  Surgeon: Midge Minium, MD;  Location: [facility];  Service: Endoscopy;  Laterality: N/A;  . PARTIAL HYSTERECTOMY     one ovary left  . REPAIR ANKLE LIGAMENT     from fall  . ROTATOR CUFF REPAIR Left    shoulder  . TONSILLECTOMY      There were no vitals filed for this visit.   Subjective Assessment - 02/09/21 1549    Subjective Pt reports she hurt her shoulder yesterday when dressing and rates pain as 7/10 currently. Pt did not sleep well last night.    Patient is accompained by: Family member    Pertinent History 60 y.o. 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  She was readmitted on 12/23/2020 with slurred speech and worsening of right-sided weakness.  MRI brain done revealing acute/subacute infarct left paramedian pons that had increased in size and small chronic left cerebellar infarct.  CTA head/neck was negative for hemorrhage and no LVO noted therefore not TPA candidate.  Stroke was felt to be due to small vessel disease and neurology recommended DAPT x3 weeks followed by aspirin alone as well as CIR as mainstay of treatment.  Therapy evaluations done revealing right-sided weakness with deficits in balance as well as decreased ability to carry out ADLs.  CIR was recommended due to functional decline. She was admitted to rehab 12/26/2020 for inpatient therapies to consist of PT and OT at least three hours five days a week. Past admission physiatrist, therapy team and rehab RN have worked together to provide customized collaborative inpatient rehab. Tramadol has been used judiciously for severe pain.  Her mood has been stable and she is showing good participation and progress during her stay.  She is showing improvement in pain coordination and currently has progressed to supervision level.  She requested shortening length of stay and will continue to receive outpatient PT and OT at [facility] outpatient rehab after discharge on 01/05/2021.  How long can you sit comfortably? 2 hours    How long can you stand comfortably? 5 mins    How long can you walk comfortably? 5-10 mins    Currently in Pain? Yes    Pain Score 7     Pain Location Shoulder    Pain Orientation Right    Pain Onset In the past 7 days           TREATMENT  Steps over hurdle and back - x multiple sets and multiple reps BLEs, CGA and no UE support; Pt with less frequent circumduction of R LE this session. However, demonstrates increased inversion of R foot when attempting to clear hurdle.  Heel cone taps emphasis on dorsiflexion BLEs - 4x15 each  SLB in // bars  4x30 sec each LE, followed by 2x30 sec RLE. CGA provided   On foam EC 4x30 sec; CGA provided.   Semi tandem on foam - 4x30 sec each foot. CGA provided. Pt with greater difficulty with LLE as support leg compared to RLE.  Tandem on foam - 2x30 sec with RLE as support leg. CGA provided.     PT Education - 02/09/21 1550    Education Details exericse technique, body mechanics    Person(s) Educated Patient    Methods Explanation;Demonstration;Verbal cues    Comprehension Verbalized understanding;Returned demonstration            PT Short Term Goals - 01/08/21 1131      PT SHORT TERM GOAL #1   Title Patient will be independent in home exercise program to improve strength/mobility for better functional independence with ADLs.    Time 6    Period Weeks    Status New    Target Date 02/19/21      PT SHORT TERM GOAL #2   Title Pt will increase strength of by at least 1/2 MMT grade in order to demonstrate improvement in strength and function.    Baseline 02/10: RLE 3+/5    Time 6    Period Weeks    Status New    Target Date 02/19/21             PT Long Term Goals - 01/08/21 1133      PT LONG TERM GOAL #1   Title Patient will increase FOTO score to equal to or greater than to 71 demonstrate statistically significant improvement in mobility and quality of life.    Baseline 02/10: 58    Time 12    Period Weeks    Status New    Target Date 04/02/21      PT LONG TERM GOAL #2   Title Patient (> 47 years old) will complete five times sit to stand test in < 15 seconds indicating an increased LE strength and improved balance.    Baseline 02/10: 17.43s without BUE support    Time 12    Period Weeks    Status New    Target Date 04/02/21      PT LONG TERM GOAL #3   Title Patient will increase six minute walk test distance to >1000 for progression to community ambulator and improve gait ability    Baseline 02/10: 840 ft    Time 12    Period Weeks    Status New    Target Date  04/02/21      PT LONG TERM GOAL #4   Title Patient will increase 10 meter walk test to >1.53m/s as to improve gait speed for  better community ambulation and to reduce fall risk.    Baseline 02/10: 0.66 m/s    Time 12    Period Weeks    Status New    Target Date 04/02/21      PT LONG TERM GOAL #5   Title Patient will reduce timed up and go to <11 seconds to reduce fall risk and demonstrate improved transfer/gait ability.    Baseline 02/10: 17.38s with 2WW    Time 12    Period Weeks    Status New    Target Date 04/02/21                 Plan - 02/09/21 1551    Clinical Impression Statement Session limited today secondary to pt late arrival. Focused on foot clearance of objects and balance. Pt with increased inversion on R foot when attempting to clear obstacles during hurdle stepping exercise. Pt performed heel cone taps to emphasize dorsiflexion to correct for inversion of R foot. Pt still with difficulty fully dorsiflexing R ankle, indicating decreased strength. Pt will benefit from further skilled therapy to improve LE strength, coordination and balance in order to improve safety with all functional mobility.    Personal Factors and Comorbidities Comorbidity 3+    Comorbidities Acute CVA, pontine stroke, migraine    Examination-Activity Limitations Bathing;Dressing;Reach Overhead;Squat    Examination-Participation Restrictions Driving;Meal Prep    Stability/Clinical Decision Making Evolving/Moderate complexity    Rehab Potential Good    PT Frequency 2x / week    PT Duration 12 weeks    PT Treatment/Interventions ADLs/Self Care Home Management;Canalith Repostioning;Electrical Stimulation;Moist Heat;Gait training;Stair training;Functional mobility training;Therapeutic activities;Therapeutic exercise;Balance training;Neuromuscular re-education;Patient/family education;Manual techniques;Passive range of motion;Energy conservation;Vestibular    PT Next Visit Plan dorsiflexion  exercises    PT Home Exercise Plan Initiate HEP    Consulted and Agree with Plan of Care Patient;Family member/caregiver    Family Member Consulted husband           Patient will benefit from skilled therapeutic intervention in order to improve the following deficits and impairments:  Abnormal gait,Decreased endurance,Decreased strength,Decreased activity tolerance,Decreased balance,Decreased mobility,Difficulty walking,Improper body mechanics,Decreased coordination  Visit Diagnosis: Unsteadiness on feet  Muscle weakness (generalized)  Other lack of coordination  Other abnormalities of gait and mobility     Problem List Patient Active Problem List   Diagnosis Date Noted  . Slow transit constipation   . Strain of right rotator cuff capsule   . Uncomplicated asthma   . Right hemiparesis (HCC)   . 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 History of migraine headaches 12/23/2020  . Status post total hysterectomy and bilateral salpingo-oophorectomy 09/23/2020  . Generalized osteoarthritis of hand 01/27/2017  . Mood disorder (HCC) 07/13/2016  . Foot pain, left 07/13/2016  . Prediabetes 11/07/2015  . Mixed hyperlipidemia 11/07/2015  . Low serum thyroid stimulating hormone (TSH) 09/30/2015  . Osteoarthritis 09/29/2015  . Menopausal syndrome (hot flashes) 09/29/2015  . Obesity, Class I, BMI 30-34.9 09/26/2015  . Vitamin D deficiency 09/26/2015  . Migraine headache without aura 09/26/2015  . Allergic asthma, mild intermittent, uncomplicated 09/26/2015  . Colon polyp 09/26/2015  . Synovial cyst of left knee 09/26/2015  . Environmental and seasonal allergies 09/26/2015   Temple Pacini PT, DPT 02/09/2021, 4:21 PM  [place] [facility] MAIN [facility] SERVICES [address] [place], [place], [address] Phone: [phone]   Fax:  [phone]  Name: [name] MRN: [id number] Date of Birth: [birth year]

## 2021-02-10 NOTE — Therapy (Signed)
[place] [facility] MAIN [facility] SERVICES [address] [place], [place], [address] Phone: [phone]   Fax:  [phone]  Occupational Therapy Treatment  Patient Details  Name: [name] MRN: [id number] Date of Birth: Nov 17, 1961 No data recorded  Encounter Date: 02/09/2021   OT End of Session - 02/09/21 2018    Visit Number 4    Number of Visits 24    Date for OT Re-Evaluation 04/15/21    Authorization Type Progress report period starting 01/21/2021    OT Start Time 1100    OT Stop Time 1146    OT Time Calculation (min) 46 min    Activity Tolerance Patient tolerated treatment well    Behavior During Therapy [facility] for tasks assessed/performed           Past Medical History:  Diagnosis Date  . Acute CVA (cerebrovascular accident) (HCC) 12/19/2020  . Arthritis    hands, feet, knees  . Asthma   . Migraine   . Mood change    due to menopause/ hot flashes  . PONV (postoperative nausea and vomiting)     Past Surgical History:  Procedure Laterality Date  . ABDOMINAL HYSTERECTOMY    . APPENDECTOMY    . CESAREAN SECTION     x 1  . COLONOSCOPY  2012   due in 2017 for suspicious lesion  . COLONOSCOPY WITH PROPOFOL N/A 08/20/2016   Procedure: COLONOSCOPY WITH PROPOFOL;  Surgeon: Midge Minium, MD;  Location: [facility];  Service: Endoscopy;  Laterality: N/A;  . PARTIAL HYSTERECTOMY     one ovary left  . REPAIR ANKLE LIGAMENT     from fall  . ROTATOR CUFF REPAIR Left    shoulder  . TONSILLECTOMY      There were no vitals filed for this visit.   Subjective Assessment - 02/09/21 2033    Subjective  Pt reports her right arm is hurting her today, has been cooking some and cleaning.  Helping a friend over the weekend.  When she went to put on her coat Saturday night and felt she pulled her shoulder.    Pertinent History Pt. is a 60 y.o. female who was admitted to [facility] on 12/19/20 with a Pontine CVA. Pt. was transferred to Inpatient rehab  at [facility], and received therapy for 11 days. Pt. has returned homw, and is ready for outpatient rehab services. PMHx includes: TIA, Arthritis, Mood change, and PONV    Patient Stated Goals To resume the use of her right hand    Pain Onset More than a month ago           Appt for shoulder 02-17-21, reports she had problems prior to her stroke. Pain in right shoulder 4/10 Heat to right shoulder for 5 mins prior to treatment to decrease pain and increase motion.  Neuromuscular Reeducation:  Pt seen for manipulation of small glass beads from flat surface, some with flat side up, others with flat side down.  Cues for prehension patterns.  Pt advancing to using translatory skills of the hand to move items to palm and using hand for storage.  Dropping items occasionally.   Unknotting exercises with medium nylon rope, bilateral hand use with cues for tip to tip patterns.  Some difficulty with knots tied tighter. Manipulation of Small buttons from tabletop with tip to tip prehension and also working towards translatory skills and using hand for storage.  Increased difficulty with moving small buttons from palm and requires cues and  increased use of thumb for task.   Therapeutic Exercise: Pt with edema in her hand and reports nothing has helped to decrease it.  Instructed on contrast info for edema control as a part of home program, issued written handout and patient demonstrates understanding.  Recommend pt perform 2-3 times a day, followed by exercises for ROM.  3 mins warm, 1 min cold, ending on warm and then perform exercises.   Pt educated on tendon gliding exercises for bilateral UEs for ROM, patient able to demo with cues and therapist demo.  Issued [employer] written/pictoral instructions.     Response to tx: Pt responding well to use of heat prior to exercises, able to demonstrate understanding of tendon gliding exercises.  Patient will also implement contrast bath in daily routine 2-3 times a  day to see if this will impact her edema.  Patient progressing well with fine motor coordination and manipulation skills with increased ability to perform translatory skills of the hand and using the hand for storage.  Patient remains limited with right shoulder due to issues with rotator cuff which she was experiencing prior to CVA.  Continue to work towards goals in plan of care to maximize safety and independence in necessary daily tasks.                      OT Education - 02/09/21 2018    Education Details HEP    Person(s) Educated Patient    Methods Explanation    Comprehension Verbalized understanding;Returned demonstration               OT Long Term Goals - 01/29/21 1438      OT LONG TERM GOAL #1   Title Pt. will improve Right grip  strength to be able to independently, and firmly hold a utensil for stirring.    Baseline Eval: Right grip is limited. Pt. has difficulty holding a cooking utensil while stirring.    Time 12    Period Weeks    Status New    Target Date 04/15/21      OT LONG TERM GOAL #2   Title Pt. will improve strength by 2 mm grades to be able to hold a leash.    Baseline Eval: RUE strength is limited. Pt. is unable to manage a leash.    Time 12    Period Weeks    Status New    Target Date 04/15/21      OT LONG TERM GOAL #3   Title Pt. will independently, and efficiently type a 3 sentence paragraph in preparation for completing work related typing tasks.    Baseline Eval: Pt. is unable to efficiently type a paragrph.    Time 12    Period Weeks    Status New    Target Date 04/15/21      OT LONG TERM GOAL #4   Title Pt. will improve right lateral pinch strength to be able to open cans, containers, and bottles.    Baseline Eval: Pt. has difficulty opening cans, bottles, and containers.    Time 12    Period Weeks    Status New    Target Date 04/15/21      OT LONG TERM GOAL #5   Title Pt. will independently engage her RUE to complete  home home management tasks.    Baseline Eval: Pt. has difficulty engaging her RUE  during home management tasks    Time 12    Status New  Target Date 04/15/21      OT LONG TERM GOAL #6   Title Pt. will demonstrate an improvement in FOTO score by 5 points.    Baseline Initial FOTO score: 61    Time 12    Period Weeks    Status New    Target Date 04/15/21                 Plan - 02/09/21 2019    Clinical Impression Statement Pt responding well to use of heat prior to exercises, able to demonstrate understanding of tendon gliding exercises.  Patient will also implement contrast bath in daily routine 2-3 times a day to see if this will impact her edema.  Patient progressing well with fine motor coordination and manipulation skills with increased ability to perform translatory skills of the hand and using the hand for storage.  Patient remains limited with right shoulder due to issues with rotator cuff which she was experiencing prior to CVA.  Continue to work towards goals in plan of care to maximize safety and independence in necessary daily tasks.    Occupational performance deficits (Please refer to evaluation for details): ADL's;IADL's    Rehab Potential Excellent    Clinical Decision Making Several treatment options, min-mod task modification necessary    Comorbidities Affecting Occupational Performance: May have comorbidities impacting occupational performance    Modification or Assistance to Complete Evaluation  Min-Moderate modification of tasks or assist with assess necessary to complete eval    OT Frequency 2x / week    OT Duration 12 weeks    OT Treatment/Interventions Self-care/ADL training;Neuromuscular education;Therapeutic activities;Patient/family education;Therapeutic exercise;DME and/or AE instruction    Consulted and Agree with Plan of Care Patient;Family member/caregiver           Patient will benefit from skilled therapeutic intervention in order to improve  the following deficits and impairments:           Visit Diagnosis: Muscle weakness (generalized)  Other lack of coordination    Problem List Patient Active Problem List   Diagnosis Date Noted  . Slow transit constipation   . Strain of right rotator cuff capsule   . Uncomplicated asthma   . Right hemiparesis (HCC)   . 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 History of migraine headaches 12/23/2020  . Status post total hysterectomy and bilateral salpingo-oophorectomy 09/23/2020  . Generalized osteoarthritis of hand 01/27/2017  . Mood disorder (HCC) 07/13/2016  . Foot pain, left 07/13/2016  . Prediabetes 11/07/2015  . Mixed hyperlipidemia 11/07/2015  . Low serum thyroid stimulating hormone (TSH) 09/30/2015  . Osteoarthritis 09/29/2015  . Menopausal syndrome (hot flashes) 09/29/2015  . Obesity, Class I, BMI 30-34.9 09/26/2015  . Vitamin D deficiency 09/26/2015  . Migraine headache without aura 09/26/2015  . Allergic asthma, mild intermittent, uncomplicated 09/26/2015  . Colon polyp 09/26/2015  . Synovial cyst of left knee 09/26/2015  . Environmental and seasonal allergies 09/26/2015   Kerrie Buffalo, OTR/L, CLT  Conya Ellinwood 02/10/2021, 8:34 PM  [place] [facility] MAIN [facility] SERVICES [address] [place], [place], [address] Phone: [phone]   Fax:  [phone]  Name: [name] MRN: [id number] Date of Birth: [birth year]

## 2021-02-11 ENCOUNTER — Other Ambulatory Visit: Payer: Self-pay

## 2021-02-11 ENCOUNTER — Encounter: Payer: Self-pay | Admitting: Occupational Therapy

## 2021-02-11 ENCOUNTER — Ambulatory Visit: Payer: BC Managed Care – PPO

## 2021-02-11 ENCOUNTER — Ambulatory Visit: Payer: BC Managed Care – PPO | Admitting: Occupational Therapy

## 2021-02-11 DIAGNOSIS — M6281 Muscle weakness (generalized): Secondary | ICD-10-CM | POA: Diagnosis not present

## 2021-02-11 DIAGNOSIS — R278 Other lack of coordination: Secondary | ICD-10-CM

## 2021-02-11 DIAGNOSIS — R2681 Unsteadiness on feet: Secondary | ICD-10-CM

## 2021-02-11 DIAGNOSIS — R2689 Other abnormalities of gait and mobility: Secondary | ICD-10-CM

## 2021-02-11 NOTE — Therapy (Addendum)
[facility] MAIN [facility] SERVICES [address] [place], [place], [address] Phone: [phone]   Fax:  [phone]  Occupational Therapy Treatment  Patient Details  Name: [name] MRN: [id number] Date of Birth: 11-05-1961 No data recorded  Encounter Date: 02/11/2021   OT End of Session - 02/11/21 0901    Visit Number 5    Number of Visits 24    Date for OT Re-Evaluation 04/15/21    Authorization Type Progress report period starting 01/21/2021    OT Start Time 0845    OT Stop Time 0930    OT Time Calculation (min) 45 min    Activity Tolerance Patient tolerated treatment well    Behavior During Therapy [facility] for tasks assessed/performed           Past Medical History:  Diagnosis Date  . Acute CVA (cerebrovascular accident) (HCC) 12/19/2020  . Arthritis    hands, feet, knees  . Asthma   . Migraine   . Mood change    due to menopause/ hot flashes  . PONV (postoperative nausea and vomiting)     Past Surgical History:  Procedure Laterality Date  . ABDOMINAL HYSTERECTOMY    . APPENDECTOMY    . CESAREAN SECTION     x 1  . COLONOSCOPY  2012   due in 2017 for suspicious lesion  . COLONOSCOPY WITH PROPOFOL N/A 08/20/2016   Procedure: COLONOSCOPY WITH PROPOFOL;  Surgeon: Midge Minium, MD;  Location: [facility];  Service: Endoscopy;  Laterality: N/A;  . PARTIAL HYSTERECTOMY     one ovary left  . REPAIR ANKLE LIGAMENT     from fall  . ROTATOR CUFF REPAIR Left    shoulder  . TONSILLECTOMY      There were no vitals filed for this visit.   Subjective Assessment - 02/11/21 0850    Subjective  Pt. reports 4/10 right shoulder pain today.    Patient is accompanied by: Family member    Pertinent History Pt. is a 60 y.o. female who was admitted to [facility] on 12/19/20 with a [place] CVA. Pt. was transferred to Inpatient rehab at [facility], and received therapy for 11 days. Pt. has returned homw, and is ready for outpatient rehab services.  PMHx includes: TIA, Arthritis, Mood change, and PONV    Currently in Pain? Yes    Pain Score 4     Pain Location Shoulder    Pain Orientation Right    Pain Descriptors / Indicators Aching          OT TREATMENT    Neuro muscular re-education:  Pt. worked on translatory movements with the right hand. Pt. worked on moving 1/2" flat marbles from her palm to the tip of her 2nd digit, and thumb. Pt. progressed to perform. Pt. worked on grasping flat coins, and worked on translatory movements moving objects from her palm to the tip of her 2nd digit to thumb, Pt. Worked on right hand [facility] skills grasping 1/2" circular tipped pegs. Pt. worked on moving the pegs through her hand from her palm to the tip of her 2nd digit, and thumb. Pt. performed [facility] tasks using the Grooved pegboard. Pt. worked on grasping the grooved pegs from a horizontal position, and moving the pegs to a vertical position between her 2nd digit, and thumb to turn them in preparation for placing them in the grooved slot.  Pt. worked on [facility] skills grasping 1" sticks, and 1/4" collars. Pt. worked on storing the  objects in the palm, and translatory skills moving the items from the palm of the hand to the tip of the 2nd digit, and thumb. Pt. worked on removing the pegs using bilateral alternating hand patterns.  Pt. reports having multiple MD appointments next week. Pt. has a follow-up appointment with the neurologist, as well as an orthopedic consult for her right shoulder. Pt. Reports the hand exercises that she is doing at home has been helping her. Pt. Is making excellent progress with hand function skills, and  translatory movements progressing from larger 1/2" flat objects to 1/2" circular objects, 1" sticks, grooved sticks, and 1/4" collars. Pt. Continues to work towards [facility] skills refining translatory movements and using the radial, and ulnar aspects of the hand simultaneously in order to improve hand function during ADLs, and IADL  tasks.                         OT Education - 02/11/21 0900    Education Details HEP    Person(s) Educated Patient    Methods Explanation    Comprehension Verbalized understanding;Returned demonstration               OT Long Term Goals - 01/29/21 1438      OT LONG TERM GOAL #1   Title Pt. will improve Right grip  strength to be able to independently, and firmly hold a utensil for stirring.    Baseline Eval: Right grip is limited. Pt. has difficulty holding a cooking utensil while stirring.    Time 12    Period Weeks    Status New    Target Date 04/15/21      OT LONG TERM GOAL #2   Title Pt. will improve strength by 2 mm grades to be able to hold a leash.    Baseline Eval: RUE strength is limited. Pt. is unable to manage a leash.    Time 12    Period Weeks    Status New    Target Date 04/15/21      OT LONG TERM GOAL #3   Title Pt. will independently, and efficiently type a 3 sentence paragraph in preparation for completing work related typing tasks.    Baseline Eval: Pt. is unable to efficiently type a paragrph.    Time 12    Period Weeks    Status New    Target Date 04/15/21      OT LONG TERM GOAL #4   Title Pt. will improve right lateral pinch strength to be able to open cans, containers, and bottles.    Baseline Eval: Pt. has difficulty opening cans, bottles, and containers.    Time 12    Period Weeks    Status New    Target Date 04/15/21      OT LONG TERM GOAL #5   Title Pt. will independently engage her RUE to complete home home management tasks.    Baseline Eval: Pt. has difficulty engaging her RUE  during home management tasks    Time 12    Status New    Target Date 04/15/21      OT LONG TERM GOAL #6   Title Pt. will demonstrate an improvement in FOTO score by 5 points.    Baseline Initial FOTO score: 61    Time 12    Period Weeks    Status New    Target Date 04/15/21  Plan - 02/11/21 0902    Clinical  Impression Statement Pt. reports having multiple MD appointments next week. Pt. has a follow-up appointment with the neurologist, as well as an orthopedic consult for her right shoulder. Pt. Reports the hand exercises that she is doing at home has been helping her. Pt. Is making excellent progress with hand function skills, and  translatory movements progressing from larger 1/2" flat objects to 1/2" circular objects, 1" sticks, grooved sticks, and 1/4" collars. Pt. Continues to work towards [facility] skills refining translatory movements and using the radial, and ulnar aspects of the hand simultaneously in order to improve hand function during ADLs, and IADL tasks.     Occupational performance deficits (Please refer to evaluation for details): ADL's;IADL's    Rehab Potential Excellent    Clinical Decision Making Several treatment options, min-mod task modification necessary    Comorbidities Affecting Occupational Performance: May have comorbidities impacting occupational performance    Modification or Assistance to Complete Evaluation  Min-Moderate modification of tasks or assist with assess necessary to complete eval    OT Frequency 2x / week    OT Duration 12 weeks    OT Treatment/Interventions Self-care/ADL training;Neuromuscular education;Therapeutic activities;Patient/family education;Therapeutic exercise;DME and/or AE instruction    Consulted and Agree with Plan of Care Patient;Family member/caregiver           Patient will benefit from skilled therapeutic intervention in order to improve the following deficits and impairments:           Visit Diagnosis: Muscle weakness (generalized)  Other lack of coordination    Problem List Patient Active Problem List   Diagnosis Date Noted  . Slow transit constipation   . Strain of right rotator cuff capsule   . Uncomplicated asthma   . Right hemiparesis (HCC)   . 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 History of migraine headaches  12/23/2020  . Status post total hysterectomy and bilateral salpingo-oophorectomy 09/23/2020  . Generalized osteoarthritis of hand 01/27/2017  . Mood disorder (HCC) 07/13/2016  . Foot pain, left 07/13/2016  . Prediabetes 11/07/2015  . Mixed hyperlipidemia 11/07/2015  . Low serum thyroid stimulating hormone (TSH) 09/30/2015  . Osteoarthritis 09/29/2015  . Menopausal syndrome (hot flashes) 09/29/2015  . Obesity, Class I, BMI 30-34.9 09/26/2015  . Vitamin D deficiency 09/26/2015  . Migraine headache without aura 09/26/2015  . Allergic asthma, mild intermittent, uncomplicated 09/26/2015  . Colon polyp 09/26/2015  . Synovial cyst of left knee 09/26/2015  . Environmental and seasonal allergies 09/26/2015    Olegario Messier, MS, OTR/L 02/11/2021, 9:13 AM  [facility] MAIN [facility] SERVICES [address] [place], [place], [address] Phone: [phone]   Fax:  [phone]  Name: [name] MRN: [id number] Date of Birth: Jun 09, 1961

## 2021-02-11 NOTE — Therapy (Deleted)
[facility] MAIN [facility] SERVICES [address] [place], [place], [address] Phone: [phone]   Fax:  [phone]  Physical Therapy Treatment  Patient Details  Name: [name] MRN: [id number] Date of Birth: 1961-02-12 Referring Provider (PT): Dr. Mariam Dollar   Encounter Date: 02/11/2021   PT End of Session - 02/11/21 1707    Visit Number 9    Number of Visits 25    Date for PT Re-Evaluation 04/02/21    Authorization Type eval: 02/10    PT Start Time 0804    PT Stop Time 0846    PT Time Calculation (min) 42 min    Equipment Utilized During Treatment Gait belt    Activity Tolerance Patient tolerated treatment well    Behavior During Therapy [facility] for tasks assessed/performed           Past Medical History:  Diagnosis Date  . Acute CVA (cerebrovascular accident) (HCC) 12/19/2020  . Arthritis    hands, feet, knees  . Asthma   . Migraine   . Mood change    due to menopause/ hot flashes  . PONV (postoperative nausea and vomiting)     Past Surgical History:  Procedure Laterality Date  . ABDOMINAL HYSTERECTOMY    . APPENDECTOMY    . CESAREAN SECTION     x 1  . COLONOSCOPY  2012   due in 2017 for suspicious lesion  . COLONOSCOPY WITH PROPOFOL N/A 08/20/2016   Procedure: COLONOSCOPY WITH PROPOFOL;  Surgeon: Midge Minium, MD;  Location: [facility];  Service: Endoscopy;  Laterality: N/A;  . PARTIAL HYSTERECTOMY     one ovary left  . REPAIR ANKLE LIGAMENT     from fall  . ROTATOR CUFF REPAIR Left    shoulder  . TONSILLECTOMY      There were no vitals filed for this visit.   Subjective Assessment - 02/11/21 1705    Subjective Pt reports 5/10 shoulder pain today. Pt states she took pain medication prior to appointment. Pt reports improved sleep last night.    Patient is accompained by: Family member    Pertinent History 60 y.o. female with history of migraines, vitamin D deficiency who was originally diagnosed with  left pontine infarct on 12/19/2020.  She was readmitted on 12/23/2020 with slurred speech and worsening of right-sided weakness.  MRI brain done revealing acute/subacute infarct left paramedian pons that had increased in size and small chronic left cerebellar infarct.  CTA head/neck was negative for hemorrhage and no LVO noted therefore not TPA candidate.  Stroke was felt to be due to small vessel disease and neurology recommended DAPT x3 weeks followed by aspirin alone as well as CIR as mainstay of treatment.  Therapy evaluations done revealing right-sided weakness with deficits in balance as well as decreased ability to carry out ADLs.  CIR was recommended due to functional decline. She was admitted to rehab 12/26/2020 for inpatient therapies to consist of PT and OT at least three hours five days a week. Past admission physiatrist, therapy team and rehab RN have worked together to provide customized collaborative inpatient rehab. Tramadol has been used judiciously for severe pain.  Her mood has been stable and she is showing good participation and progress during her stay.  She is showing improvement in pain coordination and currently has progressed to supervision level.  She requested shortening length of stay and will continue to receive outpatient PT and OT at [facility] outpatient rehab after discharge on 01/05/2021.  How long can you sit comfortably? 2 hours    How long can you stand comfortably? 5 mins    How long can you walk comfortably? 5-10 mins    Currently in Pain? Yes    Pain Score 5     Pain Location Shoulder    Pain Orientation Right    Pain Onset In the past 7 days         TREATMENT  Nustep level 1 x 4 minutes (unbilled)   Stepping over hurdle forward/back, each LE- x multiple reps B.  CGA and no UE support; Pt with fewer instanced of circumduction of RLE initially, but pt technique decreases with fatigue.  Stepping/walking over 2 hurdles in // bars, CGA - 14x, CGA provided. VC for  technique.  Walking on compliant surface with turns, CGA - x several minutes. Pt with some difficulty clearing R LE with appropriate dorsiflexion necessary for turns.   Heel-strike emphasis with stepping through agility ladder - 6x, CGA provided. Demonstration and VC for technique.  Stepping over small decline and onto decline with heel, BLEs - x multiple reps. CGA provided. Pt with poor eccentric control of B quads with reps and fatigue.   Hedge hog heel taps - 2x10 each LE, CGA.  Seated LAQ with 2# AW 3x12   Seated marches with 2# AW 1x15, 2x12    Assessment: Pt with decreased eccentric control of quads as she fatigued with stepping exercises. Pt did show improved ability to self-correct for circumduction of RLE compared to previous sessions, but still with decreased technique with fatigue. Due to these impairments, majority of session focused on improving eccentric control of B LEs over various surfaces such as on a compliant surface and with stepping over objects and on a decline. Pt will benefit from further skilled therapy to improve BLE strength and balance in order to increase safety with all functional mobility.         PT Short Term Goals - 01/08/21 1131      PT SHORT TERM GOAL #1   Title Patient will be independent in home exercise program to improve strength/mobility for better functional independence with ADLs.    Time 6    Period Weeks    Status New    Target Date 02/19/21      PT SHORT TERM GOAL #2   Title Pt will increase strength of by at least 1/2 MMT grade in order to demonstrate improvement in strength and function.    Baseline 02/10: RLE 3+/5    Time 6    Period Weeks    Status New    Target Date 02/19/21             PT Long Term Goals - 01/08/21 1133      PT LONG TERM GOAL #1   Title Patient will increase FOTO score to equal to or greater than to 71 demonstrate statistically significant improvement in mobility and quality of life.    Baseline  02/10: 58    Time 12    Period Weeks    Status New    Target Date 04/02/21      PT LONG TERM GOAL #2   Title Patient (> 62 years old) will complete five times sit to stand test in < 15 seconds indicating an increased LE strength and improved balance.    Baseline 02/10: 17.43s without BUE support    Time 12    Period Weeks    Status New  Target Date 04/02/21      PT LONG TERM GOAL #3   Title Patient will increase six minute walk test distance to >1000 for progression to community ambulator and improve gait ability    Baseline 02/10: 840 ft    Time 12    Period Weeks    Status New    Target Date 04/02/21      PT LONG TERM GOAL #4   Title Patient will increase 10 meter walk test to >1.28m/s as to improve gait speed for better community ambulation and to reduce fall risk.    Baseline 02/10: 0.66 m/s    Time 12    Period Weeks    Status New    Target Date 04/02/21      PT LONG TERM GOAL #5   Title Patient will reduce timed up and go to <11 seconds to reduce fall risk and demonstrate improved transfer/gait ability.    Baseline 02/10: 17.38s with 2WW    Time 12    Period Weeks    Status New    Target Date 04/02/21            Patient will benefit from skilled therapeutic intervention in order to improve the following deficits and impairments:  Abnormal gait,Decreased endurance,Decreased strength,Decreased activity tolerance,Decreased balance,Decreased mobility,Difficulty walking,Improper body mechanics,Decreased coordination  Visit Diagnosis: Muscle weakness (generalized)  Other lack of coordination  Unsteadiness on feet  Other abnormalities of gait and mobility     Problem List Patient Active Problem List   Diagnosis Date Noted  . Slow transit constipation   . Strain of right rotator cuff capsule   . Uncomplicated asthma   . Right hemiparesis (HCC)   . 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 History of migraine headaches 12/23/2020  . Status post total  hysterectomy and bilateral salpingo-oophorectomy 09/23/2020  . Generalized osteoarthritis of hand 01/27/2017  . Mood disorder (HCC) 07/13/2016  . Foot pain, left 07/13/2016  . Prediabetes 11/07/2015  . Mixed hyperlipidemia 11/07/2015  . Low serum thyroid stimulating hormone (TSH) 09/30/2015  . Osteoarthritis 09/29/2015  . Menopausal syndrome (hot flashes) 09/29/2015  . Obesity, Class I, BMI 30-34.9 09/26/2015  . Vitamin D deficiency 09/26/2015  . Migraine headache without aura 09/26/2015  . Allergic asthma, mild intermittent, uncomplicated 09/26/2015  . Colon polyp 09/26/2015  . Synovial cyst of left knee 09/26/2015  . Environmental and seasonal allergies 09/26/2015   Temple Pacini PT, DPT 02/11/2021, 5:24 PM  [facility] MAIN [facility] SERVICES [address] [place], [place], [address] Phone: [phone]   Fax:  [phone]  Name: [name] MRN: [id number] Date of Birth: December 13, 1960

## 2021-02-11 NOTE — Therapy (Signed)
[facility] MAIN [facility] SERVICES [address] [place], [place], [address] Phone: [phone]   Fax:  [phone]  Physical Therapy Treatment  Patient Details  Name: [name] MRN: [id number] Date of Birth: 1961-02-12 Referring Provider (PT): Dr. Mariam Dollar   Encounter Date: 02/11/2021   PT End of Session - 02/11/21 1707    Visit Number 9    Number of Visits 25    Date for PT Re-Evaluation 04/02/21    Authorization Type eval: 02/10    PT Start Time 0804    PT Stop Time 0846    PT Time Calculation (min) 42 min    Equipment Utilized During Treatment Gait belt    Activity Tolerance Patient tolerated treatment well    Behavior During Therapy [facility] for tasks assessed/performed           Past Medical History:  Diagnosis Date  . Acute CVA (cerebrovascular accident) (HCC) 12/19/2020  . Arthritis    hands, feet, knees  . Asthma   . Migraine   . Mood change    due to menopause/ hot flashes  . PONV (postoperative nausea and vomiting)     Past Surgical History:  Procedure Laterality Date  . ABDOMINAL HYSTERECTOMY    . APPENDECTOMY    . CESAREAN SECTION     x 1  . COLONOSCOPY  2012   due in 2017 for suspicious lesion  . COLONOSCOPY WITH PROPOFOL N/A 08/20/2016   Procedure: COLONOSCOPY WITH PROPOFOL;  Surgeon: Midge Minium, MD;  Location: [facility];  Service: Endoscopy;  Laterality: N/A;  . PARTIAL HYSTERECTOMY     one ovary left  . REPAIR ANKLE LIGAMENT     from fall  . ROTATOR CUFF REPAIR Left    shoulder  . TONSILLECTOMY      There were no vitals filed for this visit.   Subjective Assessment - 02/11/21 1705    Subjective Pt reports 5/10 shoulder pain today. Pt states she took pain medication prior to appointment. Pt reports improved sleep last night.    Patient is accompained by: Family member    Pertinent History 60 y.o. female with history of migraines, vitamin D deficiency who was originally diagnosed with  left pontine infarct on 12/19/2020.  She was readmitted on 12/23/2020 with slurred speech and worsening of right-sided weakness.  MRI brain done revealing acute/subacute infarct left paramedian pons that had increased in size and small chronic left cerebellar infarct.  CTA head/neck was negative for hemorrhage and no LVO noted therefore not TPA candidate.  Stroke was felt to be due to small vessel disease and neurology recommended DAPT x3 weeks followed by aspirin alone as well as CIR as mainstay of treatment.  Therapy evaluations done revealing right-sided weakness with deficits in balance as well as decreased ability to carry out ADLs.  CIR was recommended due to functional decline. She was admitted to rehab 12/26/2020 for inpatient therapies to consist of PT and OT at least three hours five days a week. Past admission physiatrist, therapy team and rehab RN have worked together to provide customized collaborative inpatient rehab. Tramadol has been used judiciously for severe pain.  Her mood has been stable and she is showing good participation and progress during her stay.  She is showing improvement in pain coordination and currently has progressed to supervision level.  She requested shortening length of stay and will continue to receive outpatient PT and OT at [facility] outpatient rehab after discharge on 01/05/2021.  How long can you sit comfortably? 2 hours    How long can you stand comfortably? 5 mins    How long can you walk comfortably? 5-10 mins    Currently in Pain? Yes    Pain Score 5     Pain Location Shoulder    Pain Orientation Right    Pain Onset In the past 7 days           TREATMENT  Nustep level 1 x 4 minutes (unbilled)   Stepping over hurdle forward/back, each LE- x multiple reps B.  CGA and no UE support; Pt with fewer instanced of circumduction of RLE initially, but pt technique decreases with fatigue.  Stepping/walking over 2 hurdles in // bars, CGA - 14x, CGA provided. VC for  technique.  Walking on compliant surface with turns, CGA - x several minutes. Pt with some difficulty clearing R LE with appropriate dorsiflexion necessary for turns.   Heel-strike emphasis with stepping through agility ladder - 6x, CGA provided. Demonstration and VC for technique.  Stepping over small decline and onto decline with heel, BLEs - x multiple reps. CGA provided. Pt with poor eccentric control of B quads with reps and fatigue.   Hedge hog heel taps - 2x10 each LE, CGA.  Seated LAQ with 2# AW 3x12   Seated marches with 2# AW 1x15, 2x12    Assessment: Pt with decreased eccentric control of quads as she fatigued with stepping exercises. Pt did show improved ability to self-correct for circumduction of RLE compared to previous sessions, but still with decreased technique with fatigue. Due to these impairments, majority of session focused on improving eccentric control of B LEs over various surfaces such as on a compliant surface and with stepping over objects and on a decline. Pt will benefit from further skilled therapy to improve BLE strength and balance in order to increase safety with all functional mobility.        PT Education - 02/11/21 1706    Education Details eccentric quad control focused exercise technique, body mechanics    Person(s) Educated Patient    Methods Explanation;Demonstration;Verbal cues    Comprehension Verbalized understanding;Returned demonstration            PT Short Term Goals - 01/08/21 1131      PT SHORT TERM GOAL #1   Title Patient will be independent in home exercise program to improve strength/mobility for better functional independence with ADLs.    Time 6    Period Weeks    Status New    Target Date 02/19/21      PT SHORT TERM GOAL #2   Title Pt will increase strength of by at least 1/2 MMT grade in order to demonstrate improvement in strength and function.    Baseline 02/10: RLE 3+/5    Time 6    Period Weeks    Status New     Target Date 02/19/21             PT Long Term Goals - 01/08/21 1133      PT LONG TERM GOAL #1   Title Patient will increase FOTO score to equal to or greater than to 71 demonstrate statistically significant improvement in mobility and quality of life.    Baseline 02/10: 58    Time 12    Period Weeks    Status New    Target Date 04/02/21      PT LONG TERM GOAL #2   Title Patient (>  61 years old) will complete five times sit to stand test in < 15 seconds indicating an increased LE strength and improved balance.    Baseline 02/10: 17.43s without BUE support    Time 12    Period Weeks    Status New    Target Date 04/02/21      PT LONG TERM GOAL #3   Title Patient will increase six minute walk test distance to >1000 for progression to community ambulator and improve gait ability    Baseline 02/10: 840 ft    Time 12    Period Weeks    Status New    Target Date 04/02/21      PT LONG TERM GOAL #4   Title Patient will increase 10 meter walk test to >1.1974m/s as to improve gait speed for better community ambulation and to reduce fall risk.    Baseline 02/10: 0.66 m/s    Time 12    Period Weeks    Status New    Target Date 04/02/21      PT LONG TERM GOAL #5   Title Patient will reduce timed up and go to <11 seconds to reduce fall risk and demonstrate improved transfer/gait ability.    Baseline 02/10: 17.38s with 2WW    Time 12    Period Weeks    Status New    Target Date 04/02/21                 Plan - 02/11/21 1708    Clinical Impression Statement Pt with decreased eccentric control of quads as she fatigued with stepping exercises. Pt did show improved ability to self-correct for circumduction of RLE compared to previous sessions, but still with decreased technique with fatigue. Due to these impairments, majority of session focused on improving eccentric control of B LEs over various surfaces such as on a compliant surface and with stepping over objects and on a  decline. Pt will benefit from further skilled therapy to improve BLE strength and balance in order to increase safety with all functional mobility.    Personal Factors and Comorbidities Comorbidity 3+    Comorbidities Acute CVA, pontine stroke, migraine    Examination-Activity Limitations Bathing;Dressing;Reach Overhead;Squat    Examination-Participation Restrictions Driving;Meal Prep    Stability/Clinical Decision Making Evolving/Moderate complexity    Rehab Potential Good    PT Frequency 2x / week    PT Duration 12 weeks    PT Treatment/Interventions ADLs/Self Care Home Management;Canalith Repostioning;Electrical Stimulation;Moist Heat;Gait training;Stair training;Functional mobility training;Therapeutic activities;Therapeutic exercise;Balance training;Neuromuscular re-education;Patient/family education;Manual techniques;Passive range of motion;Energy conservation;Vestibular    PT Next Visit Plan dorsiflexion exercises; dynamic balance on compliant or uneven surfaces    PT Home Exercise Plan Initiate HEP    Consulted and Agree with Plan of Care Patient;Family member/caregiver    Family Member Consulted husband           Patient will benefit from skilled therapeutic intervention in order to improve the following deficits and impairments:  Abnormal gait,Decreased endurance,Decreased strength,Decreased activity tolerance,Decreased balance,Decreased mobility,Difficulty walking,Improper body mechanics,Decreased coordination  Visit Diagnosis: Muscle weakness (generalized)  Other lack of coordination  Unsteadiness on feet  Other abnormalities of gait and mobility     Problem List Patient Active Problem List   Diagnosis Date Noted  . Slow transit constipation   . Strain of right rotator cuff capsule   . Uncomplicated asthma   . Right hemiparesis (HCC)   . 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 History of migraine  headaches 12/23/2020  . Status post total hysterectomy and  bilateral salpingo-oophorectomy 09/23/2020  . Generalized osteoarthritis of hand 01/27/2017  . Mood disorder (HCC) 07/13/2016  . Foot pain, left 07/13/2016  . Prediabetes 11/07/2015  . Mixed hyperlipidemia 11/07/2015  . Low serum thyroid stimulating hormone (TSH) 09/30/2015  . Osteoarthritis 09/29/2015  . Menopausal syndrome (hot flashes) 09/29/2015  . Obesity, Class I, BMI 30-34.9 09/26/2015  . Vitamin D deficiency 09/26/2015  . Migraine headache without aura 09/26/2015  . Allergic asthma, mild intermittent, uncomplicated 09/26/2015  . Colon polyp 09/26/2015  . Synovial cyst of left knee 09/26/2015  . Environmental and seasonal allergies 09/26/2015    Baird Kay 02/11/2021, 5:26 PM  [facility] MAIN [facility] SERVICES [address] [place], [place], [address] Phone: [phone]   Fax:  [phone]  Name: [name] MRN: [id number] Date of Birth: 1961/03/06

## 2021-02-12 ENCOUNTER — Encounter: Payer: Self-pay | Admitting: Orthopaedic Surgery

## 2021-02-12 ENCOUNTER — Ambulatory Visit: Payer: Self-pay

## 2021-02-12 ENCOUNTER — Encounter: Payer: Self-pay | Admitting: Occupational Therapy

## 2021-02-16 ENCOUNTER — Ambulatory Visit: Payer: BC Managed Care – PPO | Admitting: Occupational Therapy

## 2021-02-16 ENCOUNTER — Ambulatory Visit: Payer: BC Managed Care – PPO

## 2021-02-16 ENCOUNTER — Encounter: Payer: Self-pay | Admitting: Occupational Therapy

## 2021-02-16 ENCOUNTER — Other Ambulatory Visit: Payer: Self-pay

## 2021-02-16 ENCOUNTER — Ambulatory Visit: Payer: Self-pay

## 2021-02-16 DIAGNOSIS — R278 Other lack of coordination: Secondary | ICD-10-CM

## 2021-02-16 DIAGNOSIS — R2681 Unsteadiness on feet: Secondary | ICD-10-CM

## 2021-02-16 DIAGNOSIS — M6281 Muscle weakness (generalized): Secondary | ICD-10-CM | POA: Diagnosis not present

## 2021-02-16 DIAGNOSIS — R2689 Other abnormalities of gait and mobility: Secondary | ICD-10-CM

## 2021-02-16 NOTE — Therapy (Signed)
[place] MAIN [facility] SERVICES [address] [place], [place], [address] Phone: [phone]   Fax:  [phone]  Physical Therapy Treatment/ Physical Therapy Progress Note   Dates of reporting period  01/08/2021   to  02/16/2021   Patient Details  Name: [name] MRN: [id number] Date of Birth: [birth year] Referring Provider (PT): Dr. Lauraine Rinne   Encounter Date: 02/16/2021   PT End of Session - 02/16/21 1301    Visit Number 10    Number of Visits 25    Date for PT Re-Evaluation 04/02/21    Authorization Type eval: 02/10    PT Start Time 0851    PT Stop Time 0915    PT Time Calculation (min) 24 min    Equipment Utilized During Treatment Gait belt    Activity Tolerance Patient tolerated treatment well    Behavior During Therapy [facility] for tasks assessed/performed           Past Medical History:  Diagnosis Date  . Acute CVA (cerebrovascular accident) ([place]) 12/19/2020  . Arthritis    hands, feet, knees  . Asthma   . Migraine   . Mood change    due to menopause/ hot flashes  . PONV (postoperative nausea and vomiting)     Past Surgical History:  Procedure Laterality Date  . ABDOMINAL HYSTERECTOMY    . APPENDECTOMY    . CESAREAN SECTION     x 1  . COLONOSCOPY  2012   due in 2017 for suspicious lesion  . COLONOSCOPY WITH PROPOFOL N/A 08/20/2016   Procedure: COLONOSCOPY WITH PROPOFOL;  Surgeon: Lucilla Lame, MD;  Location: [employer];  Service: Endoscopy;  Laterality: N/A;  . PARTIAL HYSTERECTOMY     one ovary left  . REPAIR ANKLE LIGAMENT     from fall  . ROTATOR CUFF REPAIR Left    shoulder  . TONSILLECTOMY      There were no vitals filed for this visit.   Subjective Assessment - 02/16/21 0853    Subjective Pt shoulder pain is 4/10 today. Pt reports no falls or near falls. Pt reports balance feels ok and that she's not using SPC at home unless she is tired, going out or going up/down stairs.    Patient is  accompained by: Family member    Pertinent History 60 y.o. female with history of migraines, vitamin D deficiency who was originally diagnosed with left pontine infarct on 12/19/2020.  She was readmitted on 12/23/2020 with slurred speech and worsening of right-sided weakness.  MRI brain done revealing acute/subacute infarct left paramedian pons that had increased in size and small chronic left cerebellar infarct.  CTA head/neck was negative for hemorrhage and no LVO noted therefore not TPA candidate.  Stroke was felt to be due to small vessel disease and neurology recommended DAPT x3 weeks followed by aspirin alone as well as CIR as mainstay of treatment.  Therapy evaluations done revealing right-sided weakness with deficits in balance as well as decreased ability to carry out ADLs.  CIR was recommended due to functional decline. She was admitted to rehab 12/26/2020 for inpatient therapies to consist of PT and OT at least three hours five days a week. Past admission physiatrist, therapy team and rehab RN have worked together to provide customized collaborative inpatient rehab. Tramadol has been used judiciously for severe pain.  Her mood has been stable and she is showing good participation and progress during her stay.  She is showing improvement in pain  coordination and currently has progressed to supervision level.  She requested shortening length of stay and will continue to receive outpatient PT and OT at [facility] outpatient rehab after discharge on 01/05/2021.    How long can you sit comfortably? 2 hours    How long can you stand comfortably? 5 mins    How long can you walk comfortably? 5-10 mins    Currently in Pain? Yes    Pain Score 4     Pain Location Shoulder    Pain Orientation Right    Pain Onset In the past 7 days          TREATMENT  FOTO: 73%   MMT:  Hip flexors: L 5/5, R 4+5 Hip abductors 5/5 B Hip adductors 4+/5 B Knee flexors/ext 5/5 B Ankle dorsiflexion/plantarflexion 5/5 B    TUG: 11 sec  10MWT 1.11 m/s with SPC  6MWT 12 laps with SPC (1200 ft)  5xsts 14.8 sec  Assessment: Session limited to pt arrival time, however all goals were able to be assessed. Pt has met 6/7 therapy goals and partially met 7th therapy goal. She shows improvements on 6MWT, 5xSTS, TUG, FOTO and MMT all indicating improved gait ability/endurance, LE strength/power, improved functional mobility and decreased fall risk. Pt only using SPC out in community now for improved kinesthetic awareness for balance with ambulation. Pt is independent with HEP. Patient's condition has the potential to improve in response to therapy. Maximum improvement is yet to be obtained. The anticipated improvement is attainable and reasonable in a generally predictable time.  Due to pt progress, pt will benefit from a follow-up visit to reassess pt gait and 5xSTS prior to discharge from therapy.    PT Short Term Goals - 02/16/21 0855      PT SHORT TERM GOAL #1   Title Patient will be independent in home exercise program to improve strength/mobility for better functional independence with ADLs.    Baseline 02/16/2021: Pt is independent with HEP.    Time 6    Period Weeks    Status Achieved    Target Date 02/19/21      PT SHORT TERM GOAL #2   Title Pt will increase strength of by at least 1/2 MMT grade in order to demonstrate improvement in strength and function.    Baseline 02/10: RLE 3+/5; 3/21: grossly 4+/5 RLE, grossly 5/5 LLE 5/5    Time 6    Period Weeks    Status Achieved    Target Date 02/19/21             PT Long Term Goals - 02/16/21 0903      PT LONG TERM GOAL #1   Title Patient will increase FOTO score to equal to or greater than to 71 demonstrate statistically significant improvement in mobility and quality of life.    Baseline 02/10: 58; 02/16/2021 73%    Time 12    Period Weeks    Status Achieved      PT LONG TERM GOAL #2   Title Patient (> 70 years old) will complete five times sit to  stand test in < 15 seconds indicating an increased LE strength and improved balance.    Baseline 02/10: 17.43s without BUE support; 3/21 14.8 5 sec    Time 12    Period Weeks    Status Achieved      PT LONG TERM GOAL #3   Title Patient will increase six minute walk test distance to >1000 for progression  to community ambulator and improve gait ability    Baseline 02/10: 840 ft; 02/16/2021 1200 ft with SPC    Time 12    Period Weeks    Status Achieved      PT LONG TERM GOAL #4   Title Patient will increase 10 meter walk test to >1.28ms as to improve gait speed for better community ambulation and to reduce fall risk.    Baseline 02/10: 0.66 m/s; 1.11 m/s with SPC    Time 12    Period Weeks    Status Achieved      PT LONG TERM GOAL #5   Title Patient will reduce timed up and go to <11 seconds to reduce fall risk and demonstrate improved transfer/gait ability.    Baseline 02/10: 17.38s with 2WW; 02/16/2021 11 sec    Time 12    Period Weeks    Status Partially Met    Target Date 03/16/21                 Plan - 02/16/21 1504    Clinical Impression Statement Session limited to pt arrival time, however all goals were able to be assessed. Pt has met 6/7 therapy goals and partially met 7th therapy goal. She shows improvements on 6MWT, 5xSTS, TUG, FOTO and MMT all indicating improved gait ability/endurance, LE strength/power, improved functional mobility and decreased fall risk. Pt only using SPC out in community now for improved kinesthetic awareness for balance with ambulation. Pt is independent with HEP. Patient's condition has the potential to improve in response to therapy. Maximum improvement is yet to be obtained. The anticipated improvement is attainable and reasonable in a generally predictable time.  Due to pt progress, pt will benefit from a follow-up visit to reassess pt gait and 5xSTS prior to discharge from therapy and to provide pt with new HEP to maintain gains beyond therapy.     Personal Factors and Comorbidities Comorbidity 3+    Comorbidities Acute CVA, pontine stroke, migraine    Examination-Activity Limitations Bathing;Dressing;Reach Overhead;Squat    Examination-Participation Restrictions Driving;Meal Prep    Stability/Clinical Decision Making Evolving/Moderate complexity    Rehab Potential Good    PT Frequency 2x / week    PT Duration 12 weeks    PT Treatment/Interventions ADLs/Self Care Home Management;Canalith Repostioning;Electrical Stimulation;Moist Heat;Gait training;Stair training;Functional mobility training;Therapeutic activities;Therapeutic exercise;Balance training;Neuromuscular re-education;Patient/family education;Manual techniques;Passive range of motion;Energy conservation;Vestibular    PT Next Visit Plan dorsiflexion exercises; dynamic balance on compliant or uneven surfaces, reassess gait and 5xsts    PT Home Exercise Plan provide additional HEP    Consulted and Agree with Plan of Care Patient;Family member/caregiver    Family Member Consulted husband           Patient will benefit from skilled therapeutic intervention in order to improve the following deficits and impairments:  Abnormal gait,Decreased endurance,Decreased strength,Decreased activity tolerance,Decreased balance,Decreased mobility,Difficulty walking,Improper body mechanics,Decreased coordination  Visit Diagnosis: Other abnormalities of gait and mobility  Unsteadiness on feet     Problem List Patient Active Problem List   Diagnosis Date Noted  . Slow transit constipation   . Strain of right rotator cuff capsule   . Uncomplicated asthma   . Right hemiparesis ([place]   . Left pontine stroke ([place] 12/26/2020  . History of migraine headaches 12/23/2020  . Status post total hysterectomy and bilateral salpingo-oophorectomy 09/23/2020  . Generalized osteoarthritis of hand 01/27/2017  . Mood disorder ([place] 07/13/2016  . Foot pain, left 07/13/2016  . Prediabetes  11/07/2015  . Mixed hyperlipidemia 11/07/2015  . Low serum thyroid stimulating hormone (TSH) 09/30/2015  . Osteoarthritis 09/29/2015  . Menopausal syndrome (hot flashes) 09/29/2015  . Obesity, Class I, BMI 30-34.9 09/26/2015  . Vitamin D deficiency 09/26/2015  . Migraine headache without aura 09/26/2015  . Allergic asthma, mild intermittent, uncomplicated 09/31/1216  . Colon polyp 09/26/2015  . Synovial cyst of left knee 09/26/2015  . Environmental and seasonal allergies 09/26/2015   Ricard Dillon PT, DPT 02/16/2021, 3:11 PM  [place] MAIN [facility] SERVICES [address] [place], [place], [address] Phone: [phone]   Fax:  [phone]  Name: [name] MRN: [id number] Date of Birth: 1961-08-29

## 2021-02-16 NOTE — Therapy (Signed)
[facility] MAIN [facility] SERVICES [address] [place], [place], [address] Phone: [phone]   Fax:  [phone]  Occupational Therapy Treatment  Patient Details  Name: [name] MRN: [id number] Date of Birth: August 29, 1961 No data recorded  Encounter Date: 02/16/2021   OT End of Session - 02/16/21 0926    Visit Number 6    Number of Visits 24    Date for OT Re-Evaluation 04/15/21    Authorization Type Progress report period starting 01/21/2021    OT Start Time 0915    OT Stop Time 1000    OT Time Calculation (min) 45 min    Activity Tolerance Patient tolerated treatment well    Behavior During Therapy [facility] for tasks assessed/performed           Past Medical History:  Diagnosis Date  . Acute CVA (cerebrovascular accident) (HCC) 12/19/2020  . Arthritis    hands, feet, knees  . Asthma   . Migraine   . Mood change    due to menopause/ hot flashes  . PONV (postoperative nausea and vomiting)     Past Surgical History:  Procedure Laterality Date  . ABDOMINAL HYSTERECTOMY    . APPENDECTOMY    . CESAREAN SECTION     x 1  . COLONOSCOPY  2012   due in 2017 for suspicious lesion  . COLONOSCOPY WITH PROPOFOL N/A 08/20/2016   Procedure: COLONOSCOPY WITH PROPOFOL;  Surgeon: Midge Minium, MD;  Location: [facility];  Service: Endoscopy;  Laterality: N/A;  . PARTIAL HYSTERECTOMY     one ovary left  . REPAIR ANKLE LIGAMENT     from fall  . ROTATOR CUFF REPAIR Left    shoulder  . TONSILLECTOMY      There were no vitals filed for this visit.   Subjective Assessment - 02/16/21 0924    Subjective  Pt. reports 4/10 right shoulder pain today.    Patient is accompanied by: Family member    Pertinent History Pt. is a 60 y.o. female who was admitted to [facility] on 12/19/20 with a Pontine CVA. Pt. was transferred to Inpatient rehab at [facility], and received therapy for 11 days. Pt. has returned homw, and is ready for outpatient rehab services.  PMHx includes: TIA, Arthritis, Mood change, and PONV    Currently in Pain? Yes    Pain Location Shoulder    Pain Orientation Right    Pain Descriptors / Indicators Aching    Pain Type Acute pain          OT TREATMENT    Neuro muscular re-education:  Pt. worked on grasping the grooved pegs from a horizontal position, and moving the pegs to a vertical position between her 2nd digit, and thumb to turn them in preparation for placing them in the grooved slot.  Pt. worked on [facility] skills grasping 1" sticks, and 1/4" collars, and 1/4" flat washers. Pt. worked on storing the objects in the palm, and translatory skills moving the items from the palm of the hand to the tip of the 2nd digit, and thumb. Pt. worked on removing the pegs using bilateral alternating hand patterns. Pt. performed [facility] tasks using the Grooved pegboard. Pt. worked on grasping the grooved pegs from a horizontal position, and worked on Comptroller, moving the pegs to a vertical position in the hand to prepare for placing them in the grooved slot. Pt. worked on Architect with her right hand to grasp 1/8" squares, and remove  them from a small cylindrical container. Pt. worked on pouring the squares, and small flat buttons from the container into her right hand.   Pt. Is now able to wash the pots and pans, folding, and hanging up cloths. Pt. Is able to hold up 3 fingers while holding her   Pt. reports the hand exercises that she is doing at home has been helping her. Pt. Is making excellent progress with hand function skills, and continues to pregress with translatory movements progressing with1" sticks, grooved sticks, 1/4" collars, and 1/4" collars. Pt. Is able to pick up, and store the 1/4" washers, however at times they slide through between her fingers. Pt. Continues to work towards [facility] skills refining translatory movements and using the radial, and ulnar aspects of the hand simultaneously in order to improve hand  function during ADLs, and IADL tasks.                       OT Education - 02/16/21 0925    Education Details Right  hand [facility]    Person(s) Educated Patient    Methods Explanation    Comprehension Verbalized understanding;Returned demonstration               OT Long Term Goals - 01/29/21 1438      OT LONG TERM GOAL #1   Title Pt. will improve Right grip  strength to be able to independently, and firmly hold a utensil for stirring.    Baseline Eval: Right grip is limited. Pt. has difficulty holding a cooking utensil while stirring.    Time 12    Period Weeks    Status New    Target Date 04/15/21      OT LONG TERM GOAL #2   Title Pt. will improve strength by 2 mm grades to be able to hold a leash.    Baseline Eval: RUE strength is limited. Pt. is unable to manage a leash.    Time 12    Period Weeks    Status New    Target Date 04/15/21      OT LONG TERM GOAL #3   Title Pt. will independently, and efficiently type a 3 sentence paragraph in preparation for completing work related typing tasks.    Baseline Eval: Pt. is unable to efficiently type a paragrph.    Time 12    Period Weeks    Status New    Target Date 04/15/21      OT LONG TERM GOAL #4   Title Pt. will improve right lateral pinch strength to be able to open cans, containers, and bottles.    Baseline Eval: Pt. has difficulty opening cans, bottles, and containers.    Time 12    Period Weeks    Status New    Target Date 04/15/21      OT LONG TERM GOAL #5   Title Pt. will independently engage her RUE to complete home home management tasks.    Baseline Eval: Pt. has difficulty engaging her RUE  during home management tasks    Time 12    Status New    Target Date 04/15/21      OT LONG TERM GOAL #6   Title Pt. will demonstrate an improvement in FOTO score by 5 points.    Baseline Initial FOTO score: 61    Time 12    Period Weeks    Status New    Target Date 04/15/21  Plan - 02/16/21 0926    Clinical Impression Statement Pt. Is now able to wash the pots and pans, folding, and hanging up cloths. Pt. Is able to hold up 3 fingers while holding her   Pt. reports the hand exercises that she is doing at home has been helping her. Pt. Is making excellent progress with hand function skills, and continues to pregress with translatory movements progressing with1" sticks, grooved sticks, 1/4" collars, and 1/4" collars. Pt. Is able to pick up, and store the 1/4" washers, however at times they slide through between her fingers. Pt. Continues to work towards [facility] skills refining translatory movements and using the radial, and ulnar aspects of the hand simultaneously in order to improve hand function during ADLs, and IADL tasks.     Occupational performance deficits (Please refer to evaluation for details): ADL's;IADL's    Rehab Potential Excellent    Clinical Decision Making Several treatment options, min-mod task modification necessary    Comorbidities Affecting Occupational Performance: May have comorbidities impacting occupational performance    Modification or Assistance to Complete Evaluation  Min-Moderate modification of tasks or assist with assess necessary to complete eval    OT Frequency 2x / week    OT Duration 12 weeks    OT Treatment/Interventions Self-care/ADL training;Neuromuscular education;Therapeutic activities;Patient/family education;Therapeutic exercise;DME and/or AE instruction    Consulted and Agree with Plan of Care Patient;Family member/caregiver           Patient will benefit from skilled therapeutic intervention in order to improve the following deficits and impairments:           Visit Diagnosis: Muscle weakness (generalized)  Other lack of coordination    Problem List Patient Active Problem List   Diagnosis Date Noted  . Slow transit constipation   . Strain of right rotator cuff capsule   . Uncomplicated asthma    . Right hemiparesis (HCC)   . 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 History of migraine headaches 12/23/2020  . Status post total hysterectomy and bilateral salpingo-oophorectomy 09/23/2020  . Generalized osteoarthritis of hand 01/27/2017  . Mood disorder (HCC) 07/13/2016  . Foot pain, left 07/13/2016  . Prediabetes 11/07/2015  . Mixed hyperlipidemia 11/07/2015  . Low serum thyroid stimulating hormone (TSH) 09/30/2015  . Osteoarthritis 09/29/2015  . Menopausal syndrome (hot flashes) 09/29/2015  . Obesity, Class I, BMI 30-34.9 09/26/2015  . Vitamin D deficiency 09/26/2015  . Migraine headache without aura 09/26/2015  . Allergic asthma, mild intermittent, uncomplicated 09/26/2015  . Colon polyp 09/26/2015  . Synovial cyst of left knee 09/26/2015  . Environmental and seasonal allergies 09/26/2015    Olegario Messier, MS, OTR/L 02/16/2021, 9:28 AM  [place] [facility] MAIN [facility] SERVICES [address] [place], [place], [address] Phone: [phone]   Fax:  [phone]  Name: Kyelle Urbas MRN: [id number] Date of Birth: [birth year]

## 2021-02-17 ENCOUNTER — Ambulatory Visit: Payer: BC Managed Care – PPO

## 2021-02-17 ENCOUNTER — Ambulatory Visit: Payer: BC Managed Care – PPO | Admitting: Orthopaedic Surgery

## 2021-02-17 ENCOUNTER — Ambulatory Visit: Payer: Self-pay

## 2021-02-17 DIAGNOSIS — M25511 Pain in right shoulder: Secondary | ICD-10-CM | POA: Diagnosis not present

## 2021-02-17 NOTE — Progress Notes (Signed)
Subjective: Patient is here for ultrasound-guided intra-articular right glenohumeral injection.    Objective:  Pain with overhead reach.  Procedure: Ultrasound guided injection is preferred based studies that show increased duration, increased effect, greater accuracy, decreased procedural pain, increased response rate, and decreased cost with ultrasound guided versus blind injection.   Verbal informed consent obtained.  Time-out conducted.  Noted no overlying erythema, induration, or other signs of local infection. Ultrasound-guided right glenohumeral injection: After sterile prep with Betadine, injected 4 cc 0.25% bupivocaine without epinephrine and 6 mg betamethasone using a 22-gauge spinal needle, passing the needle from posterior approach into the glenohumeral joint.  Injectate seen filling joint capsule.  Good immediate relief.    

## 2021-02-17 NOTE — Progress Notes (Signed)
Office Visit Note   Patient: [name]           Date of Birth: 1961/08/17           MRN: [id number] Visit Date: 02/17/2021              Requested by: Reubin Milan, MD [address] [place],  [place] [address] PCP: Reubin Milan, MD   Assessment & Plan: Visit Diagnoses:  1. Acute pain of right shoulder     Plan: Impression is right shoulder adhesive capsulitis possible rotator cuff tendinosis.  Based on our discussion of treatment options she elected to have a glenohumeral injection as well as a course of outpatient physical therapy focused on her shoulder.  I would like to recheck her in 6 weeks.  MRI of she fails to improve.  Follow-Up Instructions: Return in about 6 weeks (around 03/31/2021).   Orders:  Orders Placed This Encounter  Procedures  . XR Shoulder Right  . US Guided Needle Placement  . Ambulatory referral to Physical Therapy   No orders of the defined types were placed in this encounter.     Procedures: No procedures performed   Clinical Data: No additional findings.   Subjective: Chief Complaint  Patient presents with  . Right Shoulder - Pain    [name] is a very pleasant 60 year old female here for evaluation of chronic right shoulder pain since December when she was lifting something to put in the attic and felt immediate pain.  She has used Voltaren gel and tramadol with minimal relief.  Her pain is lateral over the shoulder.  Denies any numbness and tingling.  Unfortunately after the injury she had a couple of strokes that affected the right side.  She is currently in neuro rehab.  She has noticed some decreased range of motion to the right shoulder.   Review of Systems  Constitutional: Negative.   HENT: Negative.   Eyes: Negative.   Respiratory: Negative.   Cardiovascular: Negative.   Endocrine: Negative.   Musculoskeletal: Negative.   Neurological: Negative.   Hematological: Negative.   Psychiatric/Behavioral:  Negative.   All other systems reviewed and are negative.    Objective: Vital Signs: There were no vitals taken for this visit.  Physical Exam Vitals and nursing note reviewed.  Constitutional:      Appearance: She is well-developed.  HENT:     Head: Normocephalic and atraumatic.  Pulmonary:     Effort: Pulmonary effort is normal.  Abdominal:     Palpations: Abdomen is soft.  Musculoskeletal:     Cervical back: Neck supple.  Skin:    General: Skin is warm.     Capillary Refill: Capillary refill takes less than 2 seconds.  Neurological:     Mental Status: She is alert and oriented to person, place, and time.  Psychiatric:        Behavior: Behavior normal.        Thought Content: Thought content normal.        Judgment: Judgment normal.     Ortho Exam Right shoulder shows moderate decreased range of motion with minimal decrease in strength secondary to pain with testing of rotator cuff.  Equivocal Hawkins and Neer sign.  AC joint is asymptomatic. Specialty Comments:  No specialty comments available.  Imaging: US Guided Needle Placement  Result Date: 02/17/2021 Ultrasound guided injection is preferred based studies that show increased duration, increased effect, greater accuracy, decreased procedural pain, increased response rate,  and decreased cost with ultrasound guided versus blind injection.   Verbal informed consent obtained.  Time-out conducted.  Noted no overlying erythema, induration, or other signs of local infection. Ultrasound-guided right glenohumeral injection: After sterile prep with Betadine, injected 4 cc 0.25% bupivocaine without epinephrine and 6 mg betamethasone using a 22-gauge spinal needle, passing the needle from posterior approach into the glenohumeral joint.  Injectate seen filling joint capsule.  Good immediate relief.    XR Shoulder Right  Result Date: 02/17/2021 No acute or structural abnormalities    PMFS History: Patient Active Problem List    Diagnosis Date Noted  . Slow transit constipation   . Strain of right rotator cuff capsule   . Uncomplicated asthma   . Right hemiparesis (HCC)   . 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 History of migraine headaches 12/23/2020  . Status post total hysterectomy and bilateral salpingo-oophorectomy 09/23/2020  . Generalized osteoarthritis of hand 01/27/2017  . Mood disorder (HCC) 07/13/2016  . Foot pain, left 07/13/2016  . Prediabetes 11/07/2015  . Mixed hyperlipidemia 11/07/2015  . Low serum thyroid stimulating hormone (TSH) 09/30/2015  . Osteoarthritis 09/29/2015  . Menopausal syndrome (hot flashes) 09/29/2015  . Obesity, Class I, BMI 30-34.9 09/26/2015  . Vitamin D deficiency 09/26/2015  . Migraine headache without aura 09/26/2015  . Allergic asthma, mild intermittent, uncomplicated 09/26/2015  . Colon polyp 09/26/2015  . Synovial cyst of left knee 09/26/2015  . Environmental and seasonal allergies 09/26/2015   Past Medical History:  Diagnosis Date  . Acute CVA (cerebrovascular accident) (HCC) 12/19/2020  . Arthritis    hands, feet, knees  . Asthma   . Migraine   . Mood change    due to menopause/ hot flashes  . PONV (postoperative nausea and vomiting)     Family History  Problem Relation Age of Onset  . Diabetes Paternal Uncle   . Breast cancer Paternal Grandmother 61  . CAD Father     Past Surgical History:  Procedure Laterality Date  . ABDOMINAL HYSTERECTOMY    . APPENDECTOMY    . CESAREAN SECTION     x 1  . COLONOSCOPY  2012   due in 2017 for suspicious lesion  . COLONOSCOPY WITH PROPOFOL N/A 08/20/2016   Procedure: COLONOSCOPY WITH PROPOFOL;  Surgeon: Midge Minium, MD;  Location: [facility];  Service: Endoscopy;  Laterality: N/A;  . PARTIAL HYSTERECTOMY     one ovary left  . REPAIR ANKLE LIGAMENT     from fall  . ROTATOR CUFF REPAIR Left    shoulder  . TONSILLECTOMY     Social History   Occupational History  . Not on file  Tobacco Use  .  Smoking status: Never Smoker  . Smokeless tobacco: Never Used  Vaping Use  . Vaping Use: Never used  Substance and Sexual Activity  . Alcohol use: Yes    Alcohol/week: 0.0 standard drinks    Comment: 2 drinks - 2x/mo  . Drug use: No  . Sexual activity: Not Currently

## 2021-02-18 ENCOUNTER — Ambulatory Visit: Payer: BC Managed Care – PPO | Admitting: Occupational Therapy

## 2021-02-18 ENCOUNTER — Ambulatory Visit: Payer: BC Managed Care – PPO

## 2021-02-19 ENCOUNTER — Encounter: Payer: BC Managed Care – PPO | Attending: Registered Nurse | Admitting: Physical Medicine & Rehabilitation

## 2021-02-19 ENCOUNTER — Ambulatory Visit: Payer: Self-pay

## 2021-02-19 ENCOUNTER — Encounter: Payer: Self-pay | Admitting: Physical Medicine & Rehabilitation

## 2021-02-19 ENCOUNTER — Other Ambulatory Visit: Payer: Self-pay

## 2021-02-19 VITALS — BP 145/75 | HR 76 | Temp 98.3°F | Ht 64.0 in | Wt 176.0 lb

## 2021-02-19 DIAGNOSIS — I639 Cerebral infarction, unspecified: Secondary | ICD-10-CM | POA: Diagnosis not present

## 2021-02-19 DIAGNOSIS — R269 Unspecified abnormalities of gait and mobility: Secondary | ICD-10-CM

## 2021-02-19 DIAGNOSIS — Z8669 Personal history of other diseases of the nervous system and sense organs: Secondary | ICD-10-CM | POA: Diagnosis not present

## 2021-02-19 DIAGNOSIS — M7581 Other shoulder lesions, right shoulder: Secondary | ICD-10-CM | POA: Insufficient documentation

## 2021-02-19 NOTE — Progress Notes (Signed)
Subjective:    Patient ID: [name], female    DOB: [birth year], 60 y.o.   MRN: [id number]  HPI Female with history of migraines, vitamin D deficiency who was originally diagnosed with left pontine infarct on 12/19/2020 presents for follow up for left pontine infarct.   Husband supplements history. She was last seen in clinic on 01/14/21 by NP, notes reviewed. She was seen by Neuro, notes reviewed. Since that time, pt states she did not follow up with orthotics because she has not required bracing. She saw PCP, has an appointment with Neuro. She is on ASA alone. She had steroid injection in her right shoulder with benefit. BP is relatively controlled.  Denies migraines. Bowel movements are regular now. Denies falls.  Patient works as Restaurant manager, fast food for hospitals.   Therapies: 2/week DME: Purchased shower chair Mobility: Cane in community  Pain Inventory Average Pain 2 Pain Right Now 1 My pain is dull  In the last 24 hours, has pain interfered with the following? General activity 0 Relation with others 0 Enjoyment of life 0 What TIME of day is your pain at its worst? 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  no  Any changes since last visit?  no    Family History  Problem Relation Age of Onset  . Diabetes Paternal Uncle   . Breast cancer Paternal Grandmother 85  . CAD Father    Social History   Socioeconomic History  . Marital status: Married    Spouse name: Not on file  . Number of children: Not on file  . Years of education: Not on file  . Highest education level: Not on file  Occupational History  . Not on file  Tobacco Use  . Smoking status: Never Smoker  . Smokeless tobacco: Never Used  Vaping Use  . Vaping Use: Never used  Substance and  Sexual Activity  . Alcohol use: Yes    Alcohol/week: 0.0 standard drinks    Comment: 2 drinks - 2x/mo  . Drug use: No  . Sexual activity: Not Currently  Other Topics Concern  . Not on file  Social History Narrative  . Not on file   Social Determinants of Health   Financial Resource Strain: Not on file  Food Insecurity: Not on file  Transportation Needs: Not on file  Physical Activity: Not on file  Stress: Not on file  Social Connections: Not on file   Past Surgical History:  Procedure Laterality Date  . ABDOMINAL HYSTERECTOMY    . APPENDECTOMY    . CESAREAN SECTION     x 1  . COLONOSCOPY  2012   due in 2017 for suspicious lesion  . COLONOSCOPY WITH PROPOFOL N/A 08/20/2016   Procedure: COLONOSCOPY WITH PROPOFOL;  Surgeon: Midge Minium, MD;  Location: [facility];  Service: Endoscopy;  Laterality: N/A;  . PARTIAL HYSTERECTOMY     one ovary left  . REPAIR ANKLE LIGAMENT     from fall  . ROTATOR CUFF REPAIR Left    shoulder  . TONSILLECTOMY     Past Medical History:  Diagnosis Date  . Acute CVA (cerebrovascular accident) (HCC) 12/19/2020  . Arthritis    hands, feet, knees  . Asthma   . Migraine   .  Mood change    due to menopause/ hot flashes  . PONV (postoperative nausea and vomiting)    BP (!) 145/75   Pulse 76   Temp 98.3 F (36.8 C)   Ht 5\' 4"  (1.626 m)   Wt 176 lb (79.8 kg)   SpO2 96%   BMI 30.21 kg/m   Opioid Risk Score:   Fall Risk Score:  `1  Depression screen PHQ 2/9  Depression screen [facility] 2/9 01/14/2021 01/09/2021 11/17/2020 09/17/2020 03/04/2020 09/17/2019 08/08/2019  Decreased Interest 0 0 0 1 0 0 0  Down, Depressed, Hopeless 1 0 0 1 1 0 1  PHQ - 2 Score 1 0 0 2 1 0 1  Altered sleeping 0 0 0 1 0 0 -  Tired, decreased energy 1 0 0 1 0 0 -  Change in appetite 0 0 0 1 0 0 -  Feeling bad or failure about yourself  0 0 0 0 0 0 -  Trouble concentrating 0 0 0 1 0 0 -  Moving slowly or fidgety/restless 0 0 0 0 0 0 -  Suicidal thoughts 0 0 0 0 0  0 -  PHQ-9 Score 2 0 0 6 1 0 -  Difficult doing work/chores - - Not difficult at all Not difficult at all Not difficult at all Not difficult at all -    Review of Systems  Constitutional: Negative.   HENT: Negative.   Eyes: Negative.   Respiratory: Negative.   Cardiovascular: Negative.   Gastrointestinal: Negative.   Endocrine: Negative.   Genitourinary: Negative.   Musculoskeletal: Positive for gait problem.  Skin: Negative.   Allergic/Immunologic: Negative.   Neurological: Positive for weakness and headaches. Negative for numbness.  Hematological: Negative.   Psychiatric/Behavioral: Negative.   All other systems reviewed and are negative.     Objective:   Physical Exam  Constitutional: No distress . Vital signs reviewed. HENT: Normocephalic.  Atraumatic. Eyes: EOMI. No discharge. Cardiovascular: No JVD.   Respiratory: Normal effort.  No stridor.   GI: Non-distended.   Skin: Warm and dry.  Intact. Psych: Normal mood.  Normal behavior. Musc: No edema in extremities.  No tenderness in extremities. Neurologic: Alert Motor:  RUE: Shoulder abduction, elbow flexion/extension 4+/5, wrist extension, handgrip 4+/5 Right lower extremity: Hip flexion 4+-5/5, knee extension 4+-5/5, ankle dorsiflexion 4+/5. Good standing balance and gait.    Assessment & Plan:  Female with history of migraines, vitamin D deficiency who was originally diagnosed with left pontine infarct on 12/19/2020 presents for follow up for left pontine infarct.   1.  Functional deficits and right hemiparesis secondary to left pontine infarct  Cont therapies  Follow up with Neuro  Discussed gradual returning  Patient may return to work for 4 hours/day and gradually increase to full time - note provided  2. Pain Management/pre-morbid right RTC injury:   Right ?subacromial bursa injection with benefit  3. H/o Migraines:   Cont meds  Relatively controlled at present  4. Gait abnormality  Cont  therapies  Cont cane for safety  >30 minutes spent in total, reviewing records as well as discussion with patient and husband regarding shoulder pain, return to work, return to driving, as well as aforementioned.

## 2021-02-20 ENCOUNTER — Other Ambulatory Visit: Payer: Self-pay | Admitting: Internal Medicine

## 2021-02-20 NOTE — Telephone Encounter (Signed)
Notes to clinic:  medication filled by a different provider  Review for refill    Requested Prescriptions  Pending Prescriptions Disp Refills   atorvastatin (LIPITOR) 80 MG tablet [Pharmacy Med Name: ATORVASTATIN 80 MG TABLET] 30 tablet 0    Sig: TAKE 1 TABLET EVERY DAY      Cardiovascular:  Antilipid - Statins Passed - 02/20/2021  2:31 PM      Passed - Total Cholesterol in normal range and within 360 days    Cholesterol, Total  Date Value Ref Range Status  09/17/2020 245 (H) 100 - 199 mg/dL Final   Cholesterol  Date Value Ref Range Status  12/24/2020 168 0 - 200 mg/dL Final          Passed - LDL in normal range and within 360 days    LDL Chol Calc (NIH)  Date Value Ref Range Status  09/17/2020 161 (H) 0 - 99 mg/dL Final   LDL Cholesterol  Date Value Ref Range Status  12/24/2020 86 0 - 99 mg/dL Final    Comment:           Total Cholesterol/HDL:CHD Risk Coronary Heart Disease Risk Table                     Men   Women  1/2 Average Risk   3.4   3.3  Average Risk       5.0   4.4  2 X Average Risk   9.6   7.1  3 X Average Risk  23.4   11.0        Use the calculated Patient Ratio above and the CHD Risk Table to determine the patient's CHD Risk.        ATP III CLASSIFICATION (LDL):  <100     mg/dL   Optimal  267-124  mg/dL   Near or Above                    Optimal  130-159  mg/dL   Borderline  580-998  mg/dL   High  >338     mg/dL   Very High Performed at [facility], [address] [place], [place] [address]           Passed - HDL in normal range and within 360 days    HDL  Date Value Ref Range Status  12/24/2020 68 >40 mg/dL Final  97/67/3419 70 >37 mg/dL Final          Passed - Triglycerides in normal range and within 360 days    Triglycerides  Date Value Ref Range Status  12/24/2020 70 <150 mg/dL Final          Passed - Patient is not pregnant      Passed - Valid encounter within last 12 months    Recent Outpatient Visits            1 month ago Left pontine stroke [facility])   [facility] Reubin Milan, MD   3 months ago Acute non-recurrent maxillary sinusitis   [facility] Reubin Milan, MD   5 months ago Annual physical exam   [facility] Reubin Milan, MD   6 months ago Muscle spasm of back   [facility] Reubin Milan, MD   11 months ago Moderate intermittent asthma without complication   [facility] Reubin Milan, MD       Future Appointments  In 1 month Tarry Kos, MD [facility]   In 7 months Judithann Graves Nyoka Cowden, MD [facility], [facility]

## 2021-02-25 ENCOUNTER — Ambulatory Visit: Payer: Self-pay

## 2021-02-25 ENCOUNTER — Ambulatory Visit: Payer: BC Managed Care – PPO | Admitting: Occupational Therapy

## 2021-02-25 ENCOUNTER — Other Ambulatory Visit: Payer: Self-pay

## 2021-02-25 ENCOUNTER — Encounter: Payer: Self-pay | Admitting: Occupational Therapy

## 2021-02-25 DIAGNOSIS — M6281 Muscle weakness (generalized): Secondary | ICD-10-CM | POA: Diagnosis not present

## 2021-02-25 DIAGNOSIS — I639 Cerebral infarction, unspecified: Secondary | ICD-10-CM

## 2021-02-25 DIAGNOSIS — R278 Other lack of coordination: Secondary | ICD-10-CM

## 2021-02-25 NOTE — Therapy (Signed)
[name] SERVICES [address] [place], [place], [address] Phone: [phone]   Fax:  [phone]  Occupational Therapy Treatment  Patient Details  Name: [name] MRN: [id number] Date of Birth: March 16, 1961 No data recorded  Encounter Date: 02/25/2021   OT End of Session - 02/25/21 1608    Visit Number 7    Number of Visits 24    Date for OT Re-Evaluation 04/15/21    Authorization Type Progress report period starting 01/21/2021    OT Start Time 1401    OT Stop Time 1446    OT Time Calculation (min) 45 min    Activity Tolerance Patient tolerated treatment well    Behavior During Therapy [facility] for tasks assessed/performed           Past Medical History:  Diagnosis Date  . Acute CVA (cerebrovascular accident) (HCC) 12/19/2020  . Arthritis    hands, feet, knees  . Asthma   . Migraine   . Mood change    due to menopause/ hot flashes  . PONV (postoperative nausea and vomiting)     Past Surgical History:  Procedure Laterality Date  . ABDOMINAL HYSTERECTOMY    . APPENDECTOMY    . CESAREAN SECTION     x 1  . COLONOSCOPY  2012   due in 2017 for suspicious lesion  . COLONOSCOPY WITH PROPOFOL N/A 08/20/2016   Procedure: COLONOSCOPY WITH PROPOFOL;  Surgeon: Midge Minium, MD;  Location: [facility];  Service: Endoscopy;  Laterality: N/A;  . PARTIAL HYSTERECTOMY     one ovary left  . REPAIR ANKLE LIGAMENT     from fall  . ROTATOR CUFF REPAIR Left    shoulder  . TONSILLECTOMY      There were no vitals filed for this visit.   Subjective Assessment - 02/25/21 1605    Subjective  Pt reports she returned to work and has been cooking.    Pertinent History Pt. is a 60 y.o. female who was admitted to [name] on 12/[facility]/20 with a Pontine CVA. Pt. was transferred to [facility] at [name], and received therapy for 11 days. Pt. has returned homw, and is ready for outpatient rehab services. PMHx includes: TIA, Arthritis,  Mood change, and PONV    Limitations RUE functioning    Patient Stated Goals To resume the use of her right hand    Currently in Pain? Yes    Pain Score 1     Pain Location Shoulder    Pain Orientation Right    Pain Descriptors / Indicators Aching    Pain Type Chronic pain    Pain Onset More than a month ago    Pain Onset In the past 7 days            Self Care Tool use with regular scissors with excellent bimanual integration. Pt completed multiple reps and was able to cut out a variety of lines, straight and curved.  Pt completed cutting out x2 brain shapes with 100% accuracy. Patient requires increased time to thread thick string through 2 inch and 1 inch buttons utilizing L hand to hold button and R hand to thread. Pt completed x10 total buttons threading onto string. Pt completed typing test - completed 1 paragraph of typing in under 2 minutes with 96% accuracy and 35 WPM. Pt reports R hand is slower causing errors.      OT Education - 02/25/21 1612    Education Details Right  hand [name]    Person(s) Educated Patient    Methods Explanation    Comprehension Verbalized understanding;Returned demonstration               OT Long Term Goals - 01/29/21 1438      OT LONG TERM GOAL #1   Title Pt. will improve Right grip  strength to be able to independently, and firmly hold a utensil for stirring.    Baseline Eval: Right grip is limited. Pt. has difficulty holding a cooking utensil while stirring.    Time 12    Period Weeks    Status New    Target Date 04/15/21      OT LONG TERM GOAL #2   Title Pt. will improve strength by 2 mm grades to be able to hold a leash.    Baseline Eval: RUE strength is limited. Pt. is unable to manage a leash.    Time 12    Period Weeks    Status New    Target Date 04/15/21      OT LONG TERM GOAL #3   Title Pt. will independently, and efficiently type a 3 sentence paragraph in preparation for completing work related typing tasks.     Baseline Eval: Pt. is unable to efficiently type a paragrph.    Time 12    Period Weeks    Status New    Target Date 04/15/21      OT LONG TERM GOAL #4   Title Pt. will improve right lateral pinch strength to be able to open cans, containers, and bottles.    Baseline Eval: Pt. has difficulty opening cans, bottles, and containers.    Time 12    Period Weeks    Status New    Target Date 04/15/21      OT LONG TERM GOAL #5   Title Pt. will independently engage her RUE to complete home home management tasks.    Baseline Eval: Pt. has difficulty engaging her RUE  during home management tasks    Time 12    Status New    Target Date 04/15/21      OT LONG TERM GOAL #6   Title Pt. will demonstrate an improvement in FOTO score by 5 points.    Baseline Initial FOTO score: 61    Time 12    Period Weeks    Status New    Target Date 04/15/21                 Plan - 02/25/21 1612    Clinical Impression Statement Pt reports retunring to work including typing with minimal deficits.Pt is making excellent progress with hand function skills, and continues to progress with R hand FMC to thread needles. Pt improved typing speed to 36 WPM fo complete a paragraph with 96% accuracy, reports R weakness is limiting her accuracy/speed.  Pt continues to work towards [facility] skills refining translatory movements and using the radial, and ulnar aspects of the hand simultaneously in order to improve hand function during ADLs, and IADL tasks.    Occupational performance deficits (Please refer to evaluation for details): ADL's;IADL's    Rehab Potential Excellent    Clinical Decision Making Several treatment options, min-mod task modification necessary    Comorbidities Affecting Occupational Performance: May have comorbidities impacting occupational performance    Modification or Assistance to Complete Evaluation  Min-Moderate modification of tasks or assist with assess necessary to complete eval    OT Frequency  2x /  week    OT Duration 12 weeks    OT Treatment/Interventions Self-care/ADL training;Neuromuscular education;Therapeutic activities;Patient/family education;Therapeutic exercise;DME and/or AE instruction    Consulted and Agree with Plan of Care Patient           Patient will benefit from skilled therapeutic intervention in order to improve the following deficits and impairments:           Visit Diagnosis: Other lack of coordination  Left pontine stroke [name])    Problem List Patient Active Problem List   Diagnosis Date Noted  . History of migraine 02/19/2021  . 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 Slow transit constipation   . Strain of right rotator cuff capsule   . Uncomplicated asthma   . Right hemiparesis (HCC)   . 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 History of migraine headaches 12/23/2020  . Status post total hysterectomy and bilateral salpingo-oophorectomy 09/23/2020  . Generalized osteoarthritis of hand 01/27/2017  . Mood disorder (HCC) 07/13/2016  . Foot pain, left 07/13/2016  . Prediabetes 11/07/2015  . Mixed hyperlipidemia 11/07/2015  . Low serum thyroid stimulating hormone (TSH) 09/30/2015  . Osteoarthritis 09/29/2015  . Menopausal syndrome (hot flashes) 09/29/2015  . Obesity, Class I, BMI 30-34.9 09/26/2015  . Vitamin D deficiency 09/26/2015  . Migraine headache without aura 09/26/2015  . Allergic asthma, mild intermittent, uncomplicated 09/26/2015  . Colon polyp 09/26/2015  . Synovial cyst of left knee 09/26/2015  . Environmental and seasonal allergies 09/26/2015   Kathie Dike, M.S. OTR/L  02/25/21, 5:44 PM  ascom [phone]  [facility] MAIN [facility] SERVICES [address] [place], [place], [address] Phone: [phone]   Fax:  [phone]  Name: Davonne Baby MRN: [id number] Date of Birth: July 16, 1961

## 2021-02-27 ENCOUNTER — Ambulatory Visit: Payer: Self-pay

## 2021-02-27 ENCOUNTER — Ambulatory Visit: Payer: BC Managed Care – PPO | Admitting: Occupational Therapy

## 2021-03-03 ENCOUNTER — Ambulatory Visit: Payer: BC Managed Care – PPO | Admitting: Physical Therapy

## 2021-03-05 ENCOUNTER — Encounter: Payer: Self-pay | Admitting: Occupational Therapy

## 2021-03-05 ENCOUNTER — Other Ambulatory Visit: Payer: Self-pay

## 2021-03-05 ENCOUNTER — Other Ambulatory Visit: Payer: Self-pay | Admitting: Internal Medicine

## 2021-03-05 ENCOUNTER — Ambulatory Visit: Payer: BC Managed Care – PPO | Admitting: Occupational Therapy

## 2021-03-05 ENCOUNTER — Ambulatory Visit: Payer: BC Managed Care – PPO | Attending: Physician Assistant

## 2021-03-05 DIAGNOSIS — I639 Cerebral infarction, unspecified: Secondary | ICD-10-CM | POA: Insufficient documentation

## 2021-03-05 DIAGNOSIS — M6281 Muscle weakness (generalized): Secondary | ICD-10-CM

## 2021-03-05 DIAGNOSIS — G8191 Hemiplegia, unspecified affecting right dominant side: Secondary | ICD-10-CM | POA: Insufficient documentation

## 2021-03-05 DIAGNOSIS — R2689 Other abnormalities of gait and mobility: Secondary | ICD-10-CM

## 2021-03-05 DIAGNOSIS — R2681 Unsteadiness on feet: Secondary | ICD-10-CM | POA: Diagnosis present

## 2021-03-05 DIAGNOSIS — R278 Other lack of coordination: Secondary | ICD-10-CM | POA: Diagnosis present

## 2021-03-05 DIAGNOSIS — R269 Unspecified abnormalities of gait and mobility: Secondary | ICD-10-CM | POA: Insufficient documentation

## 2021-03-05 NOTE — Therapy (Signed)
[place] MAIN [facility] SERVICES [address] [place], [place], [address] Phone: [phone]   Fax:  [phone]  Physical Therapy Treatment  Patient Details  Name: [name] MRN: [id number] Date of Birth: 1960/12/29 Referring Provider (PT): Dr. Lauraine Rinne   Encounter Date: 03/05/2021   PT End of Session - 03/05/21 1423    Visit Number 11    Number of Visits 25    Date for PT Re-Evaluation 04/02/21    PT Start Time 4268    PT Stop Time 1345    PT Time Calculation (min) 38 min    Activity Tolerance Patient tolerated treatment well;No increased pain    Behavior During Therapy WFL for tasks assessed/performed           Past Medical History:  Diagnosis Date  . Acute CVA (cerebrovascular accident) ([place]) 12/19/2020  . Arthritis    hands, feet, knees  . Asthma   . Migraine   . Mood change    due to menopause/ hot flashes  . PONV (postoperative nausea and vomiting)     Past Surgical History:  Procedure Laterality Date  . ABDOMINAL HYSTERECTOMY    . APPENDECTOMY    . CESAREAN SECTION     x 1  . COLONOSCOPY  2012   due in 2017 for suspicious lesion  . COLONOSCOPY WITH PROPOFOL N/A 08/20/2016   Procedure: COLONOSCOPY WITH PROPOFOL;  Surgeon: Lucilla Lame, MD;  Location: [place];  Service: Endoscopy;  Laterality: N/A;  . PARTIAL HYSTERECTOMY     one ovary left  . REPAIR ANKLE LIGAMENT     from fall  . ROTATOR CUFF REPAIR Left    shoulder  . TONSILLECTOMY      There were no vitals filed for this visit.   Subjective Assessment - 03/05/21 1422    Subjective Pt conitnues to do well with her mobility at home. Todya she would like to discuss her longterm POC with author, update her HEP, have help with floor to standing transitions, and increaes her independence with strength regimen at home to decreaes burden of twice weekly PT schedule.    Pertinent History 60 y.o. female with history of migraines, vitamin D  deficiency who was originally diagnosed with left pontine infarct on 12/19/2020.  She was readmitted on 12/23/2020 with slurred speech and worsening of right-sided weakness.  MRI brain done revealing acute/subacute infarct left paramedian pons that had increased in size and small chronic left cerebellar infarct.  CTA head/neck was negative for hemorrhage and no LVO noted therefore not TPA candidate.  Stroke was felt to be due to small vessel disease and neurology recommended DAPT x3 weeks followed by aspirin alone as well as CIR as mainstay of treatment.  Therapy evaluations done revealing right-sided weakness with deficits in balance as well as decreased ability to carry out ADLs.  CIR was recommended due to functional decline. She was admitted to rehab 12/26/2020 for inpatient therapies to consist of PT and OT at least three hours five days a week. Past admission physiatrist, therapy team and rehab RN have worked together to provide customized collaborative inpatient rehab. Tramadol has been used judiciously for severe pain.  Her mood has been stable and she is showing good participation and progress during her stay.  She is showing improvement in pain coordination and currently has progressed to supervision level.  She requested shortening length of stay and will continue to receive outpatient PT and OT at [facility] outpatient rehab after  discharge on 01/05/2021.    Currently in Pain? No/denies           INTERVENTION THIS DATE: -education on HEP advancement to allow patient to continue to work on remaining deficits at home with independence  -STS from lower surface, hands free (15 inches) 1x10, then 1x10 without full sit -STS from chair with 6" step under Left foot 1x10 -[place] 1x12, wide stance -Education on floor to standing transition 1. tailor sitting to quadruped: education on use of dumbbells for wrist comfort successful 2. quadruped to half kneeling to tall half kneeling: recommend  performed near a wall for safety, garden pad fo knee DJD 3. quadruped on free-weights to standing with downward dog transition 4. tall half kneeling to standing with chair anterior for BUE leaning -Education on use of SPC to maintain symmetry of gait for progression of time in exercise walking -semitandem static balance 2x30sec, education on progression -semitandem balance 2x30sec c trunk rotation, education on progress           PT Short Term Goals - 02/16/21 0855      PT SHORT TERM GOAL #1   Title Patient will be independent in home exercise program to improve strength/mobility for better functional independence with ADLs.    Baseline 02/16/2021: Pt is independent with HEP.    Time 6    Period Weeks    Status Achieved    Target Date 02/19/21      PT SHORT TERM GOAL #2   Title Pt will increase strength of by at least 1/2 MMT grade in order to demonstrate improvement in strength and function.    Baseline 02/10: RLE 3+/5; 3/21: grossly 4+/5 RLE, grossly 5/5 LLE 5/5    Time 6    Period Weeks    Status Achieved    Target Date 02/19/21             PT Long Term Goals - 03/05/21 1426      PT LONG TERM GOAL #1   Title Patient will increase FOTO score to equal to or greater than to 71 demonstrate statistically significant improvement in mobility and quality of life.    Baseline 02/10: 58; 02/16/2021 73%    Time 12    Period Weeks    Status Achieved    Target Date 04/02/21      PT LONG TERM GOAL #2   Title Patient (> 58 years old) will complete five times sit to stand test in < 10 seconds from a 15" surface height indicating an increased LE strength and improved balance.    Time 12    Period Weeks    Status Revised    Target Date 04/02/21      PT LONG TERM GOAL #3   Title Patient will increase six minute walk test distance to >1500 for progression to community ambulator and improve gait ability without decliner in motor control of pelvis.    Time 12    Period Weeks     Status Revised    Target Date 04/02/21      PT LONG TERM GOAL #4   Title Patient will increase 10 meter walk test to >1.55ms as to improve gait speed for better community ambulation and to reduce fall risk.    Baseline 02/10: 0.66 m/s; 1.11 m/s with SPC    Time 12    Period Weeks    Status Achieved    Target Date 04/02/21      PT  LONG TERM GOAL #5   Title Patient will reduce timed up and go to <11 seconds to reduce fall risk and demonstrate improved transfer/gait ability.    Baseline 02/10: 17.38s with 2WW; 02/16/2021 11 sec    Time 12    Period Weeks    Status Partially Met    Target Date 04/02/21      Additional Long Term Goals   Additional Long Term Goals Yes      PT LONG TERM GOAL #6   Title Pt will demonstrate ability to go from tailor sitting on floor to standing in less than 15 seconds without use of other object (permitted use of dummbells).    Time 4    Period Weeks    Status New    Target Date 04/02/21                 Plan - 03/05/21 1423    Clinical Impression Statement Education on HEP updates. Education self progression of HEP. Education on gait progression at home. Pt issued handout, would like to return in 1 month for another followup and potential HEP update one again. Plan is to reassess in early May.    Personal Factors and Comorbidities Comorbidity 3+    Comorbidities Acute CVA, pontine stroke, migraine    Examination-Activity Limitations Bathing;Dressing;Reach Overhead;Squat    Examination-Participation Restrictions Driving;Meal Prep    Stability/Clinical Decision Making Evolving/Moderate complexity    Clinical Decision Making Moderate    Rehab Potential Good    PT Frequency 2x / week    PT Duration 12 weeks    PT Treatment/Interventions ADLs/Self Care Home Management;Canalith Repostioning;Electrical Stimulation;Moist Heat;Gait training;Stair training;Functional mobility training;Therapeutic activities;Therapeutic exercise;Balance  training;Neuromuscular re-education;Patient/family education;Manual techniques;Passive range of motion;Energy conservation;Vestibular    PT Next Visit Plan reassess stability to impairments in gait over 5-8 minutes; reassess floor to standing transition.    PT Home Exercise Plan updated on 03/05/21 (progress as needed)    Consulted and Agree with Plan of Care Patient           Patient will benefit from skilled therapeutic intervention in order to improve the following deficits and impairments:  Abnormal gait,Decreased endurance,Decreased strength,Decreased activity tolerance,Decreased balance,Decreased mobility,Difficulty walking,Improper body mechanics,Decreased coordination  Visit Diagnosis: No diagnosis found.     Problem List Patient Active Problem List   Diagnosis Date Noted  . History of migraine 02/19/2021  . 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 Slow transit constipation   . Strain of right rotator cuff capsule   . Uncomplicated asthma   . Right hemiparesis ([place])   . Left pontine stroke ([place]) 12/26/2020  . History of migraine headaches 12/23/2020  . Status post total hysterectomy and bilateral salpingo-oophorectomy 09/23/2020  . Generalized osteoarthritis of hand 01/27/2017  . Mood disorder ([place]) 07/13/2016  . Foot pain, left 07/13/2016  . Prediabetes 11/07/2015  . Mixed hyperlipidemia 11/07/2015  . Low serum thyroid stimulating hormone (TSH) 09/30/2015  . Osteoarthritis 09/29/2015  . Menopausal syndrome (hot flashes) 09/29/2015  . Obesity, Class I, BMI 30-34.9 09/26/2015  . Vitamin D deficiency 09/26/2015  . Migraine headache without aura 09/26/2015  . Allergic asthma, mild intermittent, uncomplicated 16/08/9603  . Colon polyp 09/26/2015  . Synovial cyst of left knee 09/26/2015  . Environmental and seasonal allergies 09/26/2015   2:42 PM, 03/05/21 Etta Grandchild, PT, DPT Physical Therapist - [facility]  Outpatient Physical  [place] C 03/05/2021, 2:34 PM  [facility]  [place] MAIN [facility] SERVICES [address] [place], [place], [address] Phone: [phone]   Fax:  [phone]  Name: [name] MRN: [id number] Date of Birth: 05/02/1961

## 2021-03-05 NOTE — Therapy (Signed)
[place] MAIN [facility] SERVICES [address] [place], [place], [address] Phone: [phone]   Fax:  [phone]  Occupational Therapy Treatment/Discharge Note  Patient Details  Name: [name] MRN: [id number] Date of Birth: [birth year] No data recorded  Encounter Date: 03/05/2021   OT End of Session - 03/05/21 1358    Visit Number 8    Number of Visits 24    Date for OT Re-Evaluation 04/15/21    Authorization Type Progress report period starting 01/21/2021    OT Start Time 1348    OT Stop Time 1430    OT Time Calculation (min) 42 min    Activity Tolerance Patient tolerated treatment well    Behavior During Therapy [facility] for tasks assessed/performed           Past Medical History:  Diagnosis Date  . Acute CVA (cerebrovascular accident) ([place]) 12/19/2020  . Arthritis    hands, feet, knees  . Asthma   . Migraine   . Mood change    due to menopause/ hot flashes  . PONV (postoperative nausea and vomiting)     Past Surgical History:  Procedure Laterality Date  . ABDOMINAL HYSTERECTOMY    . APPENDECTOMY    . CESAREAN SECTION     x 1  . COLONOSCOPY  2012   due in 2017 for suspicious lesion  . COLONOSCOPY WITH PROPOFOL N/A 08/20/2016   Procedure: COLONOSCOPY WITH PROPOFOL;  Surgeon: Lucilla Lame, MD;  Location: [place];  Service: Endoscopy;  Laterality: N/A;  . PARTIAL HYSTERECTOMY     one ovary left  . REPAIR ANKLE LIGAMENT     from fall  . ROTATOR CUFF REPAIR Left    shoulder  . TONSILLECTOMY      There were no vitals filed for this visit.   Subjective Assessment - 03/05/21 1357    Subjective  Pt reports she returned to work and has been cooking.    Patient is accompanied by: Family member    Pertinent History Pt. is a 60 y.o. female who was admitted to [facility] on 12/19/20 with a [place] CVA. Pt. was transferred to Inpatient rehab at [facility], and received therapy for 11 days. Pt. has returned homw, and is ready for  outpatient rehab services. PMHx includes: TIA, Arthritis, Mood change, and PONV    Currently in Pain? No/denies              [facility] OT Assessment - 03/05/21 1400      Coordination   Right 9 Hole Peg Test 26      Hand Function   Right Hand Grip (lbs) 30#    Right Hand Lateral Pinch 13 lbs    Right Hand 3 Point Pinch 9 lbs          Measurements were obtained, and goals were reviewed with the pt. Pt. has made progress overall with UE grip strength, pinch strength, and [place] skills. Pt. Is now able to complete daily ADLS, and IADL tasks. Pt. has met goals, and has started back to work 6 hours a day. FOTO score is 77. Pt. is now ready for discharge form OT services.                   OT Education - 03/05/21 1358    Education Details Right  hand [facility]    Person(s) Educated Patient    Methods Explanation    Comprehension Verbalized understanding;Returned demonstration  OT Long Term Goals - 03/05/21 1410      OT LONG TERM GOAL #1   Title Pt. will improve Right grip  strength to be able to independently, and firmly hold a utensil for stirring.    Baseline Met    Time 12    Period Weeks    Status Achieved      OT LONG TERM GOAL #2   Title Pt. will improve strength by 2 mm grades to be able to hold a leash.    Baseline Eval: RUE strength is limited.    Time 12    Period Weeks    Status New      OT LONG TERM GOAL #3   Title Pt. will independently, and efficiently type a 3 sentence paragraph in preparation for completing work related typing tasks.    Baseline Pt. has returned to work, and typing efficiently.    Time 12    Period Weeks    Status Achieved      OT LONG TERM GOAL #4   Title Pt. will improve right lateral pinch strength to be able to open cans, containers, and bottles.    Baseline Pt. has improved, and is now able to  open cans, and jars.    Time 12    Period Weeks    Status Achieved      OT LONG TERM GOAL #5   Title Pt. will  independently engage her RUE to complete home home management tasks.    Baseline Pt. has resumed home managemetn tasks.    Time 12    Period Weeks    Status Achieved      OT LONG TERM GOAL #6   Title Pt. will demonstrate an improvement in FOTO score by 5 points.    Baseline FOTO score: 77    Time 12    Period Weeks    Status Achieved                 Plan - 03/05/21 1359    Clinical Impression Statement Measurements were obtained, and goals were reviewed with the pt. Pt. has made progress overall with UE grip strength, pinch strength, and [place] skills. Pt. Is now able to complete daily ADLS, and IADL tasks. Pt. has met goals, and has started back to work 6 hours a day. FOTO score is 77. Pt. is now ready for discharge form OT services.    Occupational performance deficits (Please refer to evaluation for details): ADL's;IADL's    Rehab Potential Excellent    Clinical Decision Making Several treatment options, min-mod task modification necessary    Comorbidities Affecting Occupational Performance: May have comorbidities impacting occupational performance    Modification or Assistance to Complete Evaluation  Min-Moderate modification of tasks or assist with assess necessary to complete eval    OT Frequency 2x / week    OT Duration 12 weeks    OT Treatment/Interventions Self-care/ADL training;Neuromuscular education;Therapeutic activities;Patient/family education;Therapeutic exercise;DME and/or AE instruction    Consulted and Agree with Plan of Care Patient           Patient will benefit from skilled therapeutic intervention in order to improve the following deficits and impairments:           Visit Diagnosis: Muscle weakness (generalized)  Other lack of coordination    Problem List Patient Active Problem List   Diagnosis Date Noted  . History of migraine 02/19/2021  . 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 Slow transit constipation   .  Strain of right rotator cuff  capsule   . Uncomplicated asthma   . Right hemiparesis ([place])   . Left pontine stroke ([place]) 12/26/2020  . History of migraine headaches 12/23/2020  . Status post total hysterectomy and bilateral salpingo-oophorectomy 09/23/2020  . Generalized osteoarthritis of hand 01/27/2017  . Mood disorder ([place]) 07/13/2016  . Foot pain, left 07/13/2016  . Prediabetes 11/07/2015  . Mixed hyperlipidemia 11/07/2015  . Low serum thyroid stimulating hormone (TSH) 09/30/2015  . Osteoarthritis 09/29/2015  . Menopausal syndrome (hot flashes) 09/29/2015  . Obesity, Class I, BMI 30-34.9 09/26/2015  . Vitamin D deficiency 09/26/2015  . Migraine headache without aura 09/26/2015  . Allergic asthma, mild intermittent, uncomplicated 91/79/2178  . Colon polyp 09/26/2015  . Synovial cyst of left knee 09/26/2015  . Environmental and seasonal allergies 09/26/2015    Harrel Carina, MS, OTR/L 03/05/2021, 2:45 PM  [place] MAIN [facility] SERVICES [address] [place], [place], [address] Phone: [phone]   Fax:  [phone]  Name: [name] MRN: [id number] Date of Birth: 1961-04-26

## 2021-03-09 ENCOUNTER — Encounter: Payer: Self-pay | Admitting: Internal Medicine

## 2021-03-09 ENCOUNTER — Ambulatory Visit: Payer: BC Managed Care – PPO

## 2021-03-10 ENCOUNTER — Other Ambulatory Visit: Payer: Self-pay

## 2021-03-10 DIAGNOSIS — J3089 Other allergic rhinitis: Secondary | ICD-10-CM

## 2021-03-10 MED ORDER — MONTELUKAST SODIUM 10 MG PO TABS: 10.0000 mg | ORAL_TABLET | Freq: Every day | ORAL | 1 refills | Status: DC

## 2021-03-11 ENCOUNTER — Encounter: Payer: BC Managed Care – PPO | Admitting: Occupational Therapy

## 2021-03-12 ENCOUNTER — Ambulatory Visit: Payer: BC Managed Care – PPO

## 2021-03-17 ENCOUNTER — Encounter: Payer: BC Managed Care – PPO | Admitting: Occupational Therapy

## 2021-03-17 ENCOUNTER — Ambulatory Visit: Payer: BC Managed Care – PPO

## 2021-03-18 ENCOUNTER — Encounter: Payer: Self-pay | Admitting: Orthopaedic Surgery

## 2021-03-18 ENCOUNTER — Encounter: Payer: Self-pay | Admitting: Internal Medicine

## 2021-03-19 ENCOUNTER — Ambulatory Visit: Payer: BC Managed Care – PPO

## 2021-03-19 ENCOUNTER — Encounter: Payer: BC Managed Care – PPO | Admitting: Occupational Therapy

## 2021-03-24 ENCOUNTER — Encounter: Payer: BC Managed Care – PPO | Admitting: Occupational Therapy

## 2021-03-24 ENCOUNTER — Ambulatory Visit: Payer: BC Managed Care – PPO

## 2021-03-26 ENCOUNTER — Other Ambulatory Visit: Payer: Self-pay | Admitting: Internal Medicine

## 2021-03-26 ENCOUNTER — Ambulatory Visit: Payer: BC Managed Care – PPO

## 2021-03-26 ENCOUNTER — Encounter: Payer: BC Managed Care – PPO | Admitting: Occupational Therapy

## 2021-03-26 MED ORDER — TIZANIDINE HCL 4 MG PO TABS: 2.0000 mg | ORAL_TABLET | Freq: Every day | ORAL | 0 refills | Status: DC

## 2021-03-26 NOTE — Telephone Encounter (Signed)
Please advise for tizanidine refill.  Last prescription discontinued by another provider 01/14/21.

## 2021-03-30 ENCOUNTER — Ambulatory Visit: Payer: BC Managed Care – PPO

## 2021-03-31 ENCOUNTER — Other Ambulatory Visit: Payer: Self-pay

## 2021-03-31 ENCOUNTER — Ambulatory Visit: Payer: Self-pay

## 2021-03-31 ENCOUNTER — Ambulatory Visit: Payer: BC Managed Care – PPO | Admitting: Orthopaedic Surgery

## 2021-03-31 DIAGNOSIS — G8929 Other chronic pain: Secondary | ICD-10-CM

## 2021-03-31 DIAGNOSIS — M25511 Pain in right shoulder: Secondary | ICD-10-CM | POA: Diagnosis not present

## 2021-03-31 NOTE — Progress Notes (Signed)
Subjective: Patient is here for ultrasound-guided intra-articular right glenohumeral injection.  Improved ROM after last injection.  Objective:  Still slightly limited with overhead reach and behind the back.  Procedure: Ultrasound guided injection is preferred based studies that show increased duration, increased effect, greater accuracy, decreased procedural pain, increased response rate, and decreased cost with ultrasound guided versus blind injection.   Verbal informed consent obtained.  Time-out conducted.  Noted no overlying erythema, induration, or other signs of local infection. Ultrasound-guided right glenohumeral injection: After sterile prep with Betadine, injected 4 cc 0.25% bupivocaine without epinephrine and 6 mg betamethasone using a 22-gauge spinal needle, passing the needle from posterior approach into the glenohumeral joint.  Good ROM during anesthetic phase.

## 2021-03-31 NOTE — Progress Notes (Signed)
Office Visit Note   Patient: [name]           Date of Birth: 1961-09-27           MRN: [id number] Visit Date: 03/31/2021              Requested by: Reubin Milan, MD [address] [place],  [place] [address] PCP: Reubin Milan, MD   Assessment & Plan: Visit Diagnoses:  1. Chronic right shoulder pain     Plan: Her frozen shoulder is improving from the injection.  We will resubmit the referral to outpatient PT as she never got a phone call the first time.  She will follow-up with [place] if she continues to have any problems if it does not resolve with another injection today and outpatient PT.  Follow-Up Instructions: Return if symptoms worsen or fail to improve.   Orders:  No orders of the defined types were placed in this encounter.  No orders of the defined types were placed in this encounter.     Procedures: No procedures performed   Clinical Data: No additional findings.   Subjective: Chief Complaint  Patient presents with  . Right Shoulder - Pain    Ms. Eaglin returns today for follow-up of right shoulder pain likely due to frozen shoulder.  The shoulder injection helped about 50%.  Her range of motion is significantly better.  The main complaint is that she has trouble reaching behind her back.  She has also continued to recover from the stroke and is now walking without a cane.   Review of Systems   Objective: Vital Signs: There were no vitals taken for this visit.  Physical Exam  Ortho Exam Right shoulder shows improvement in range of motion.  Forward flexion 125 degrees, external rotation 45 degrees, internal rotation to the waist. Specialty Comments:  No specialty comments available.  Imaging: No results found.   PMFS History: Patient Active Problem List   Diagnosis Date Noted  . History of migraine 02/19/2021  . 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 Slow transit constipation   . Strain of right rotator cuff capsule    . Uncomplicated asthma   . Right hemiparesis ([facility])   . 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 History of migraine headaches 12/23/2020  . Status post total hysterectomy and bilateral salpingo-oophorectomy 09/23/2020  . Generalized osteoarthritis of hand 01/27/2017  . Mood disorder ([facility]) 07/13/2016  . Foot pain, left 07/13/2016  . Prediabetes 11/07/2015  . Mixed hyperlipidemia 11/07/2015  . Low serum thyroid stimulating hormone (TSH) 09/30/2015  . Osteoarthritis 09/29/2015  . Menopausal syndrome (hot flashes) 09/29/2015  . Obesity, Class I, BMI 30-34.9 09/26/2015  . Vitamin D deficiency 09/26/2015  . Migraine headache without aura 09/26/2015  . Allergic asthma, mild intermittent, uncomplicated 09/26/2015  . Colon polyp 09/26/2015  . Synovial cyst of left knee 09/26/2015  . Environmental and seasonal allergies 09/26/2015   Past Medical History:  Diagnosis Date  . Acute CVA (cerebrovascular accident) ([facility]) 12/19/2020  . Arthritis    hands, feet, knees  . Asthma   . Migraine   . Mood change    due to menopause/ hot flashes  . PONV (postoperative nausea and vomiting)     Family History  Problem Relation Age of Onset  . Diabetes Paternal Uncle   . Breast cancer Paternal Grandmother 28  . CAD Father     Past Surgical History:  Procedure Laterality Date  . ABDOMINAL HYSTERECTOMY    .  APPENDECTOMY    . CESAREAN SECTION     x 1  . COLONOSCOPY  2012   due in 2017 for suspicious lesion  . COLONOSCOPY WITH PROPOFOL N/A 08/20/2016   Procedure: COLONOSCOPY WITH PROPOFOL;  Surgeon: Midge Minium, MD;  Location: [facility];  Service: Endoscopy;  Laterality: N/A;  . PARTIAL HYSTERECTOMY     one ovary left  . REPAIR ANKLE LIGAMENT     from fall  . ROTATOR CUFF REPAIR Left    shoulder  . TONSILLECTOMY     Social History   Occupational History  . Not on file  Tobacco Use  . Smoking status: Never Smoker  . Smokeless tobacco: Never Used  Vaping Use  . Vaping Use:  Never used  Substance and Sexual Activity  . Alcohol use: Yes    Alcohol/week: 0.0 standard drinks    Comment: 2 drinks - 2x/mo  . Drug use: No  . Sexual activity: Not Currently

## 2021-04-02 ENCOUNTER — Other Ambulatory Visit: Payer: Self-pay | Admitting: Internal Medicine

## 2021-04-02 ENCOUNTER — Ambulatory Visit: Payer: BC Managed Care – PPO

## 2021-04-02 ENCOUNTER — Other Ambulatory Visit: Payer: Self-pay

## 2021-04-02 NOTE — Telephone Encounter (Signed)
  Notes to clinic:  medication that has been requested is not on current medication list  Review for refill    Requested Prescriptions  Pending Prescriptions Disp Refills   citalopram (CELEXA) 20 MG tablet [Pharmacy Med Name: CITALOPRAM HBR 20 MG TABLET] 90 tablet     Sig: TAKE 1 TABLET BY MOUTH EVERY DAY      Psychiatry:  Antidepressants - SSRI Passed - 04/02/2021 11:57 AM      Passed - Valid encounter within last 6 months    Recent Outpatient Visits           2 months ago Left pontine stroke [facility])   [facility] Reubin Milan, MD   4 months ago Acute non-recurrent maxillary sinusitis   [facility] Reubin Milan, MD   6 months ago Annual physical exam   [facility] Reubin Milan, MD   8 months ago Muscle spasm of back   [facility] Reubin Milan, MD   1 year ago Moderate intermittent asthma without complication   [facility] Reubin Milan, MD       Future Appointments             In 5 months Judithann Graves Nyoka Cowden, MD [facility]

## 2021-04-03 ENCOUNTER — Other Ambulatory Visit: Payer: Self-pay

## 2021-04-03 ENCOUNTER — Encounter: Payer: Self-pay | Admitting: Internal Medicine

## 2021-04-03 ENCOUNTER — Other Ambulatory Visit: Payer: Self-pay | Admitting: Internal Medicine

## 2021-04-03 ENCOUNTER — Ambulatory Visit: Payer: BC Managed Care – PPO | Attending: Orthopaedic Surgery

## 2021-04-03 DIAGNOSIS — F39 Unspecified mood [affective] disorder: Secondary | ICD-10-CM

## 2021-04-03 DIAGNOSIS — R278 Other lack of coordination: Secondary | ICD-10-CM | POA: Insufficient documentation

## 2021-04-03 DIAGNOSIS — M25511 Pain in right shoulder: Secondary | ICD-10-CM | POA: Insufficient documentation

## 2021-04-03 DIAGNOSIS — G8929 Other chronic pain: Secondary | ICD-10-CM | POA: Diagnosis present

## 2021-04-03 DIAGNOSIS — R2689 Other abnormalities of gait and mobility: Secondary | ICD-10-CM

## 2021-04-03 DIAGNOSIS — M6281 Muscle weakness (generalized): Secondary | ICD-10-CM | POA: Diagnosis present

## 2021-04-03 MED ORDER — CITALOPRAM HYDROBROMIDE 20 MG PO TABS: 20.0000 mg | ORAL_TABLET | Freq: Every day | ORAL | 1 refills | Status: DC

## 2021-04-03 NOTE — Therapy (Signed)
[facility] MAIN [facility] SERVICES [address] [place], [place], [address] Phone: [phone]   Fax:  [phone]  Physical Therapy Evaluation  Patient Details  Name: [name] MRN: [id number] Date of Birth: 1961/05/30 Referring Provider (PT): Tarry Kos, MD   Encounter Date: 04/03/2021   PT End of Session - 04/03/21 0912    Visit Number 1    Number of Visits 25    Date for PT Re-Evaluation 06/26/21    Authorization Type eval performed 04/03/2021    PT Start Time 0808    PT Stop Time 0903    PT Time Calculation (min) 55 min    Activity Tolerance Patient tolerated treatment well    Behavior During Therapy [facility] for tasks assessed/performed           Past Medical History:  Diagnosis Date  . Acute CVA (cerebrovascular accident) (HCC) 12/19/2020  . Arthritis    hands, feet, knees  . Asthma   . Migraine   . Mood change    due to menopause/ hot flashes  . PONV (postoperative nausea and vomiting)     Past Surgical History:  Procedure Laterality Date  . ABDOMINAL HYSTERECTOMY    . APPENDECTOMY    . CESAREAN SECTION     x 1  . COLONOSCOPY  2012   due in 2017 for suspicious lesion  . COLONOSCOPY WITH PROPOFOL N/A 08/20/2016   Procedure: COLONOSCOPY WITH PROPOFOL;  Surgeon: Midge Minium, MD;  Location: [facility];  Service: Endoscopy;  Laterality: N/A;  . PARTIAL HYSTERECTOMY     one ovary left  . REPAIR ANKLE LIGAMENT     from fall  . ROTATOR CUFF REPAIR Left    shoulder  . TONSILLECTOMY      There were no vitals filed for this visit.    Subjective Assessment - 04/03/21 0813    Subjective Pt known to PT. Pt reports chronic R shoulder pain that started two weeks prior to Pontine CVA this past January. Pt states she was lifting multiple heavy boxes into her attic and states, "they were too heavy."  She reports pain onset soon after and states, "it would not go away."  Pt then had stroke that affected her R side and  states, "I was too weak to do anything." Pt later seen at this clinic for both OT and PT. Pt reports her R shoulder pain and mobility limitations have continued to be a problem since she has been discharged from OT and PT, so she decided it was time to pursue PT for her R shoulder. Pt has since had two cortisone injections in R shoulder (most recently as 3/22 per chart). She states she felt "almost 50%" improvement with first injection," but states "nothing really improved" after second injection. She reports she has been diagnosed with R frozen shoulder. She reports difficulties holding groceries and heavy objects with R arm. She is unable to sleep on R side due to pain. Pt reports R shoulder pain is currently 3/10. Worst R shoulder pain is 8/10. Best pain is 3/10. She reports heat and ice help pain. She indicates using her RUE increases pain. Pt reports pain travels into neck, elbow, and even her wrist. She denies burning sensations, N/T. Describes pain as mostly an "ache." She thinks her neck is contributing to her problem. She reports R side neck pain and says it is currently 3/10. Best pain is 3/10. Worst pain is 6/10. Pt would like to decrease  her pain and improve R shoulder ROM so she can dress without pain and play the drums without pain. She reports she did fall last week when she was walking her dog. She reports holding leash in LUE and that dog suddenly pulled her and she fell on an outstretched hand on both UEs. She presents with some bruising on dorsal side of R lateral wrist. She has mild swelling at medial elbow of RUE.  She reports no further injury.    Pertinent History Pt. is a 60 y.o. female known to PT where she was previously seen secondary to Pontine CVA with right-sided weakness (she also received OT); initially admitted to [facility] on 12/19/20 and transferred to Inpatient rehab at [facility] where she received therapy for 11 days, after which she attended outpatient neuro. She now returns to PT  referred for acute pain of R shoulder/adhesive capsulitis of R shoulder that pt reports started two weeks prior to her stroke after lifting heavy boxes. Pt with hx of migraines, Vit D deficiency, OA, asthma (uncomplicated), L foot pain, prediabetes.    Limitations Lifting;House hold activities;Other (comment)   limits her ability to perform work duties, stand up from the floor, play the drums, dress   How long can you sit comfortably? n/a    How long can you stand comfortably? n/a    How long can you walk comfortably? unaffected    Diagnostic tests none listed    Patient Stated Goals Pt would like to be able to put on/take off her jacket without pain, she would like to be able to play the drums without R shoulder pain    Currently in Pain? Yes    Pain Score 3     Pain Location Shoulder    Pain Orientation Right    Pain Descriptors / Indicators Sharp;Aching;Shooting    Pain Type Chronic pain    Pain Radiating Towards neck, and elbow to wrist    Pain Onset More than a month ago    Pain Frequency Constant    Aggravating Factors  doing things quickly with R arm, lifting with R arm, laying on R arm, most activities that require use of RUE    Pain Relieving Factors Ice, heat, pain medication    Effect of Pain on Daily Activities limiting ADLs    Multiple Pain Sites Yes    Pain Score 3    Pain Location Neck    Pain Orientation Right    Pain Descriptors / Indicators Aching            History: Pt known to PT. Pt reports chronic R shoulder pain that started two weeks prior to Pontine CVA this past January. Pt states she was lifting multiple heavy boxes into her attic and states, "they were too heavy."  She reports pain onset soon after and states, "it would not go away."  Pt then had stroke that affected her R side and states, "I was too weak to do anything." Pt later seen at this clinic for both OT and PT. Pt reports her R shoulder pain and mobility limitations have continued to be a problem  since she has been discharged from OT and PT, so she decided it was time to pursue PT for her R shoulder. Pt has since had two cortisone injections in R shoulder (most recently as 3/22 per chart). She states she felt "almost 50%" improvement with first injection," but states "nothing really improved" after second injection. She reports she has been  diagnosed with R frozen shoulder. She reports difficulties holding groceries and heavy objects with R arm. She is unable to sleep on R side due to pain. Pt reports R shoulder pain is currently 3/10. Worst R shoulder pain is 8/10. Best pain is 3/10. She reports heat and ice help pain. She indicates using her RUE increases pain. Pt reports pain travels into neck, elbow, and even her wrist. She denies burning sensations, N/T. Describes pain as mostly an "ache." She thinks her neck is contributing to her problem. She reports R side neck pain and says it is currently 3/10. Best pain is 3/10. Worst pain is 6/10. Pt would like to decrease her pain and improve R shoulder ROM so she can dress without pain and play the drums without pain. She reports she did fall last week when she was walking her dog. She reports holding leash in LUE and that dog suddenly pulled her and she fell on an outstretched hand on both UEs. She presents with some bruising on dorsal side of R lateral wrist. She has mild swelling at medial elbow of RUE.  She reports no further injury.   GOALS for PT: perform work duties, dress, play drums without pain or mobility limitations in RUE  PAIN: RUE: currently 3/10, worst 8/10, best 3/10 Neck (R side): currently 3/10, worst 6/10, best 3/10  Observation:  Bruising on R lateral aspect of wrist; some swelling observed medial aspect of R elbow near cubital fossa  POSTURE:  No gross abnormalities noted or observed in sitting or standing  STRENGTH/MMT: UE: LUE grossly 5/5.  RUE grossly 4-/5.  Greatest weakness with shoulder extension (3-/5), R shoulder ER  (3/5, pain limited) and shoulder abduction (3/5). Majority of other muscles tested 4/5. Overall, muscle testing of RTC musculature pain limited. Pt RUE strength exception was WNL grip strength and elbow extension.  AROM: UE Motion: LUE: WNL for all, no pain   RUE:   Shoulder flexion 140 deg.* Shoulder abduction 114 deg, painful* Shoulder ER 35 deg and painful* Shoulder IR 60 deg and painful  Cervical: Flexion: WFL Extension: WFL Rotation: R 50 deg. (states "feels very tigh");  L 75 deg.*  Lateral flexion: R 35 deg; L 40 deg*  No pain with any cervical motions  SENSATION: WNL  Coordination: WNL    OUTCOME MEASURES:  FOTO: 58 (risk-adjusted 47; goal 64)  QuickDash Disability/Symptom score: 43%  QuickDash Work Module: 18.75%  QuickDash Performing Arts Module: 50%      Interventions:   Instructed in and had pt perform HEP in session (see below) Educated patient on performing activities and exercises within pain-free ranges/intensities.  Standing with towel shoulder isometrics: R shoulder extension 1x10 with 5 sec hold R shoulder IR: 1x10 with 5 sec hold R shoulder ER: 1x10 with 5 sec hold No pain throughout. Does report more fatigue with R shoulder RE    Access Code: [email or web address] URL: [email or web address] Date: 04/03/2021 Prepared by: Temple Pacini  Exercises Circular Shoulder Pendulum with Table Support - 1 x daily - 7 x weekly - 2 sets - 10 reps Seated Cervical Sidebending Stretch - 1 x daily - 7 x weekly - 2 sets - 2 reps - 30 hold Seated Levator Scapulae Stretch - 1 x daily - 7 x weekly - 2 sets - 2 reps - 30 hold    Assessment: Pt is a pleasant 60 y/o female referred to PT for right shoulder adhesive capsulitis and pain. Upon examination pt presents with R shoulder and  R-sided neck pain, impaired R shoulder AROM/PROM and strength, as well as impaired cervical ROM. Pt scores on FOTO (58), and QuickDash Disability/Symptom score (43%), as well as  QuickDash Performing Arts Module (50%), indicate decreased functional mobility of R shoulder and decreased QOL. These deficits are impacting pt's ability to play the drums and perform ADLs as well as work-related tasks. The pt will benefit from further skilled PT to improve pain, ROM and strength deficits in order to increase ease with all functional mobility and QOL.  Objective measurements completed on examination: See above findings.      [facility] PT Assessment - 04/03/21 0001      Assessment   Medical Diagnosis R acute shoulder pain    Referring Provider (PT) Tarry Kos, MD    Onset Date/Surgical Date 12/08/20    Hand Dominance Right    Prior Therapy Good outcomes with inpatient therapy at [facility]      Precautions   Precautions None      Restrictions   Weight Bearing Restrictions No      Home Environment   Living Environment Private residence    Living Arrangements Spouse/significant other    Available Help at Discharge Family    Type of Home House    Home Access Stairs to enter    Entrance Stairs-Number of Steps 4    Entrance Stairs-Rails Right;Left   pt able to use R UE with handrail but repots decreased weighbearing due to pain     Prior Function   Level of Independence Independent    Vocation Full time employment    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    Baseline 5/6: initial HEP issued (pendulums, upper trap stretch, levator scap stretch)    Time 6    Period Weeks    Status New    Target Date 05/15/21             PT Long Term Goals - 04/03/21 0915      PT LONG TERM GOAL #1   Title Patient will increase FOTO score to equal to or greater than  to 64 demonstrate statistically significant improvement in mobility and quality of life.    Baseline 5/6: 58% (risk-adjusted 47)    Time 12    Period Weeks    Status New    Target Date 06/26/21      PT LONG TERM GOAL #2   Title Patient will decrease Quick DASH score by > 8 points demonstrating reduced self-reported upper extremity disability.    Baseline 5/6: 43%    Time 12    Period Weeks    Status New    Target Date 06/26/21      PT LONG TERM GOAL #3   Title Patient will decrease Quick DASH Performing Arts module by > 8 points demonstrating improved ability for pt to use RUE to play the drums.    Baseline 04/03/2021: 50%    Time 12    Period Weeks    Status New    Target Date 06/26/21      PT  LONG TERM GOAL #4   Title Patient will report a NPR worst pain of 3/10 in RUE over past two weeks in order to improve tolerance with ADLs and reduced symptoms with taking off a jacket.    Baseline 5/6: Pt rates worst pain as 8/10, has difficulty doffing jacket due to RUE mobility restrictions and pain.    Time 12    Period Weeks    Status New    Target Date 06/26/21      PT LONG TERM GOAL #5   Title Patient will increase RUE gross strength to 4+/5 as to improve functional strength and tolerance for work related tasks.    Baseline 5/6: RUE grossly 4-/5. Pain limited thorughout. Greatest impairment seen with R shoulder extension, abduction and ER.    Time 12    Period Weeks    Status New    Target Date 06/26/21      PT LONG TERM GOAL #6   Title Pt will demonstrate improvement of at least 15 degrees with R shoulder flexion, abuction, ER, and IR in order to improve ease with bathing.    Baseline 5/6: R shoulder flexion 140 deg.; R shoulder abduction 114 deg.; R shoulder ER 35 deg.; R shoulder IR 60 deg (abduciton, ER and IR are painful)    Time 12    Period Days    Target Date 06/26/21                  Plan - 04/03/21 1014    Clinical Impression Statement Pt is a pleasant 60  y/o female referred to PT for right shoulder adhesive capsulitis and pain. Upon examination pt presents with R shoulder and R-sided neck pain, impaired R shoulder AROM/PROM and strength, as well as impaired cervical ROM. Pt scores on FOTO (58), and QuickDash Disability/Symptom score (43%), as well as QuickDash Performing Arts Module (50%), indicate decreased functional mobility of R shoulder and decreased QOL. These deficits are impacting pt's ability to play the drums and perform ADLs as well as work-related tasks. The pt will benefit from further skilled PT to improve pain, ROM and strength deficits in order to increase ease with all functional mobility and QOL.    Personal Factors and Comorbidities Sex;Comorbidity 1;Comorbidity 2;Past/Current Experience;Time since onset of injury/illness/exacerbation    Comorbidities CVA with R side weakness, OA, migraine    Examination-Activity Limitations Bathing;Reach Overhead;Bed Mobility;Dressing;Hygiene/Grooming;Lift;Carry    Examination-Participation Restrictions Cleaning;Laundry;Community Activity;Meal Prep;Shop;Yard Work;Occupation    Stability/Clinical Decision Making Stable/Uncomplicated    Clinical Decision Making Low    Rehab Potential Good    PT Frequency 2x / week    PT Duration 12 weeks    PT Treatment/Interventions ADLs/Self Care Home Management;Biofeedback;Cryotherapy;Electrical Stimulation;Moist Heat;Traction;Ultrasound;Functional mobility training;Therapeutic activities;Therapeutic exercise;Neuromuscular re-education;Patient/family education;Manual techniques;Passive range of motion;Splinting;Taping;Joint Manipulations;Spinal Manipulations    PT Next Visit Plan mobilizations to tolerance, strengthening    PT Home Exercise Plan Initial HEP Access Code: [email or web address]    Consulted and Agree with Plan of Care Patient           Patient will benefit from skilled therapeutic intervention in order to improve the following deficits and impairments:   Decreased activity tolerance,Decreased endurance,Decreased range of motion,Decreased strength,Hypomobility,Increased fascial restricitons,Impaired UE functional use,Improper body mechanics,Pain,Decreased mobility,Increased muscle spasms,Impaired flexibility  Visit Diagnosis: Chronic right shoulder pain  Muscle weakness (generalized)  Other abnormalities of gait and mobility     Problem List Patient Active Problem List   Diagnosis Date Noted  . History of migraine 02/19/2021  .  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 Slow transit constipation   . Strain of right rotator cuff capsule   . Uncomplicated asthma   . Right hemiparesis (HCC)   . 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 History of migraine headaches 12/23/2020  . Status post total hysterectomy and bilateral salpingo-oophorectomy 09/23/2020  . Generalized osteoarthritis of hand 01/27/2017  . Mood disorder (HCC) 07/13/2016  . Foot pain, left 07/13/2016  . Prediabetes 11/07/2015  . Mixed hyperlipidemia 11/07/2015  . Low serum thyroid stimulating hormone (TSH) 09/30/2015  . Osteoarthritis 09/29/2015  . Menopausal syndrome (hot flashes) 09/29/2015  . Obesity, Class I, BMI 30-34.9 09/26/2015  . Vitamin D deficiency 09/26/2015  . Migraine headache without aura 09/26/2015  . Allergic asthma, mild intermittent, uncomplicated 09/26/2015  . Colon polyp 09/26/2015  . Synovial cyst of left knee 09/26/2015  . Environmental and seasonal allergies 09/26/2015   Temple PaciniHaley Saiquan Hands PT, DPT 04/03/2021, 10:21 AM  [facility] [address] [place], [place], [id number] Phone: [phone]   Fax:  [phone]  Name: Retia PasseCandice Malek MRN: [id number] Date of Birth: 12/11/1960

## 2021-04-04 ENCOUNTER — Other Ambulatory Visit: Payer: Self-pay | Admitting: Internal Medicine

## 2021-04-04 DIAGNOSIS — J3089 Other allergic rhinitis: Secondary | ICD-10-CM

## 2021-04-07 ENCOUNTER — Other Ambulatory Visit: Payer: Self-pay

## 2021-04-07 ENCOUNTER — Ambulatory Visit: Payer: BC Managed Care – PPO | Admitting: Physical Therapy

## 2021-04-07 ENCOUNTER — Ambulatory Visit: Payer: BC Managed Care – PPO

## 2021-04-07 ENCOUNTER — Encounter: Payer: BC Managed Care – PPO | Admitting: Occupational Therapy

## 2021-04-07 DIAGNOSIS — R2689 Other abnormalities of gait and mobility: Secondary | ICD-10-CM

## 2021-04-07 DIAGNOSIS — M25511 Pain in right shoulder: Secondary | ICD-10-CM

## 2021-04-07 DIAGNOSIS — G8929 Other chronic pain: Secondary | ICD-10-CM

## 2021-04-07 DIAGNOSIS — M6281 Muscle weakness (generalized): Secondary | ICD-10-CM

## 2021-04-07 NOTE — Therapy (Signed)
[facility] MAIN [facility] SERVICES [address] [place], [place], [address] Phone: [phone]   Fax:  [phone]  Physical Therapy Treatment  Patient Details  Name: [name] MRN: [id number] Date of Birth: 04-08-1961 Referring Provider (PT): Tarry Kos, MD   Encounter Date: 04/07/2021   PT End of Session - 04/07/21 1315    Visit Number 2    Number of Visits 25    Date for PT Re-Evaluation 06/26/21    Authorization Type eval performed 04/03/2021    PT Start Time 0810    PT Stop Time 0854    PT Time Calculation (min) 44 min    Activity Tolerance Patient tolerated treatment well    Behavior During Therapy [facility] for tasks assessed/performed           Past Medical History:  Diagnosis Date  . Acute CVA (cerebrovascular accident) (HCC) 12/19/2020  . Arthritis    hands, feet, knees  . Asthma   . Migraine   . Mood change    due to menopause/ hot flashes  . PONV (postoperative nausea and vomiting)     Past Surgical History:  Procedure Laterality Date  . ABDOMINAL HYSTERECTOMY    . APPENDECTOMY    . CESAREAN SECTION     x 1  . COLONOSCOPY  2012   due in 2017 for suspicious lesion  . COLONOSCOPY WITH PROPOFOL N/A 08/20/2016   Procedure: COLONOSCOPY WITH PROPOFOL;  Surgeon: Midge Minium, MD;  Location: [facility];  Service: Endoscopy;  Laterality: N/A;  . PARTIAL HYSTERECTOMY     one ovary left  . REPAIR ANKLE LIGAMENT     from fall  . ROTATOR CUFF REPAIR Left    shoulder  . TONSILLECTOMY      There were no vitals filed for this visit.   Subjective Assessment - 04/07/21 1216    Subjective Pt states pain level in R shoulder is 2/10 today. She reports warm shower prior to PT session helped make her R shoulder feel better. She reports performing some HEP (stretches, pendulums).    Pertinent History Pt. is a 60 y.o. female known to PT where she was previously seen secondary to Pontine CVA with right-sided weakness (she also  received OT); initially admitted to [facility] on 12/19/20 and transferred to Inpatient rehab at [facility] where she received therapy for 11 days, after which she attended outpatient neuro. She now returns to PT referred for acute pain of R shoulder/adhesive capsulitis of R shoulder that pt reports started two weeks prior to her stroke after lifting heavy boxes. Pt with hx of migraines, Vit D deficiency, OA, asthma (uncomplicated), L foot pain, prediabetes.    Limitations Lifting;House hold activities;Other (comment)   limits her ability to perform work duties, stand up from the floor, play the drums, dress   How long can you sit comfortably? n/a    How long can you stand comfortably? n/a    How long can you walk comfortably? unaffected    Diagnostic tests none listed    Patient Stated Goals Pt would like to be able to put on/take off her jacket without pain, she would like to be able to play the drums without R shoulder pain    Currently in Pain? Yes    Pain Score 2     Pain Location Shoulder    Pain Orientation Right    Pain Onset More than a month ago  TREATMENT   Seated UBE bike forward/backward 3 min warm-up (unbilled)  Manual:  Pt supine on plinth: R anterior and posterior shoulder glides, grade II-III -  6x30 sec each  R shoulder lateral distraction grade I-II- 6x30 sec  R shoulder inferior glide, grade I-II x30 sec; discontinued as pt reports increased sx. PROM R shoulder abduction (to approx 85 deg, pain-free range); flexion (to approx 165 deg, pain-free range), ER - 2x for each to assess range after glides.   Pt supine on plinth as PT provided STM to R upper trap, RTC/posterior shoulder musculature, R cervical paraspinals, suboccip. Release x 12 min  Therex:  Pendulums CW/CC, forward/backward, side-to-side - 10x for each; VC/Demo for technique  Seated, physioball rollouts forward/backward, side-to-side within patients pain-free range - 10x each  Standing R shoulder  isometrics with towel roll: shoulder flexion, extension, ER - 10 reps each with 5 sec hold, within pain-free range.  Pt seated, hot pack applied to R shoulder musculature at end of session for approx 5 minute. Once hot pack removed no adverse reaction to treatment reported, pt reports heat feels good on shoulder musculature. (unbilled)  Education provided throughout session via VC/TC and demonstration to facilitate movement at target joints and correct muscle activation for all testing and exercises performed. Pt exhibits good carryover within session after cuing.   Assessment: Pt highly motivated to participate in treatment. She responds well to majority of manual therapy and glides performed to R shoulder. PT did have to discontinue inferior glide of R shoulder as pt reported increase in sx. The pt will benefit from further skilled therapy to improve pain, strength and ROM of R shoulder.      PT Short Term Goals - 04/03/21 0913      PT SHORT TERM GOAL #1   Title Patient will be independent in home exercise program to improve strength/mobility of RUE for better functional independence with ADLs.    Baseline 5/6: initial HEP issued (pendulums, upper trap stretch, levator scap stretch)    Time 6    Period Weeks    Status New    Target Date 05/15/21             PT Long Term Goals - 04/03/21 0915      PT LONG TERM GOAL #1   Title Patient will increase FOTO score to equal to or greater than to 64 demonstrate statistically significant improvement in mobility and quality of life.    Baseline 5/6: 58% (risk-adjusted 47)    Time 12    Period Weeks    Status New    Target Date 06/26/21      PT LONG TERM GOAL #2   Title Patient will decrease Quick DASH score by > 8 points demonstrating reduced self-reported upper extremity disability.    Baseline 5/6: 43%    Time 12    Period Weeks    Status New    Target Date 06/26/21      PT LONG TERM GOAL #3   Title Patient will decrease Quick  DASH Performing Arts module by > 8 points demonstrating improved ability for pt to use RUE to play the drums.    Baseline 04/03/2021: 50%    Time 12    Period Weeks    Status New    Target Date 06/26/21      PT LONG TERM GOAL #4   Title Patient will report a NPR worst pain of 3/10 in RUE over past two weeks in order  to improve tolerance with ADLs and reduced symptoms with taking off a jacket.    Baseline 5/6: Pt rates worst pain as 8/10, has difficulty doffing jacket due to RUE mobility restrictions and pain.    Time 12    Period Weeks    Status New    Target Date 06/26/21      PT LONG TERM GOAL #5   Title Patient will increase RUE gross strength to 4+/5 as to improve functional strength and tolerance for work related tasks.    Baseline 5/6: RUE grossly 4-/5. Pain limited thorughout. Greatest impairment seen with R shoulder extension, abduction and ER.    Time 12    Period Weeks    Status New    Target Date 06/26/21      PT LONG TERM GOAL #6   Title Pt will demonstrate improvement of at least 15 degrees with R shoulder flexion, abuction, ER, and IR in order to improve ease with bathing.    Baseline 5/6: R shoulder flexion 140 deg.; R shoulder abduction 114 deg.; R shoulder ER 35 deg.; R shoulder IR 60 deg (abduciton, ER and IR are painful)    Time 12    Period Days    Target Date 06/26/21                 Plan - 04/07/21 1313    Clinical Impression Statement Pt highly motivated to participate in treatment. She responds well to majority of manual therapy and glides performed to R shoulder. PT did have to discontinue inferior glide of R shoulder as pt reported increase in sx. The pt will benefit from further skilled therapy to improve pain, strength and ROM of R shoulder.    Personal Factors and Comorbidities Sex;Comorbidity 1;Comorbidity 2;Past/Current Experience;Time since onset of injury/illness/exacerbation    Comorbidities CVA with R side weakness, OA, migraine     Examination-Activity Limitations Bathing;Reach Overhead;Bed Mobility;Dressing;Hygiene/Grooming;Lift;Carry    Examination-Participation Restrictions Cleaning;Laundry;Community Activity;Meal Prep;Shop;Yard Work;Occupation    Stability/Clinical Decision Making Stable/Uncomplicated    Rehab Potential Good    PT Frequency 2x / week    PT Duration 12 weeks    PT Treatment/Interventions ADLs/Self Care Home Management;Biofeedback;Cryotherapy;Electrical Stimulation;Moist Heat;Traction;Ultrasound;Functional mobility training;Therapeutic activities;Therapeutic exercise;Neuromuscular re-education;Patient/family education;Manual techniques;Passive range of motion;Splinting;Taping;Joint Manipulations;Spinal Manipulations    PT Next Visit Plan mobilizations to tolerance, strengthening, progress shoulder IR    PT Home Exercise Plan Initial HEP Access Code: [email or web address]    Consulted and Agree with Plan of Care Patient           Patient will benefit from skilled therapeutic intervention in order to improve the following deficits and impairments:  Decreased activity tolerance,Decreased endurance,Decreased range of motion,Decreased strength,Hypomobility,Increased fascial restricitons,Impaired UE functional use,Improper body mechanics,Pain,Decreased mobility,Increased muscle spasms,Impaired flexibility  Visit Diagnosis: Chronic right shoulder pain  Muscle weakness (generalized)  Other abnormalities of gait and mobility     Problem List Patient Active Problem List   Diagnosis Date Noted  . History of migraine 02/19/2021  . 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 Slow transit constipation   . Strain of right rotator cuff capsule   . Uncomplicated asthma   . Right hemiparesis (HCC)   . 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 History of migraine headaches 12/23/2020  . Status post total hysterectomy and bilateral salpingo-oophorectomy 09/23/2020  . Generalized osteoarthritis of hand 01/27/2017  . Mood  disorder (HCC) 07/13/2016  . Foot pain, left 07/13/2016  . Prediabetes 11/07/2015  . Mixed hyperlipidemia 11/07/2015  . Low serum  thyroid stimulating hormone (TSH) 09/30/2015  . Osteoarthritis 09/29/2015  . Menopausal syndrome (hot flashes) 09/29/2015  . Obesity, Class I, BMI 30-34.9 09/26/2015  . Vitamin D deficiency 09/26/2015  . Migraine headache without aura 09/26/2015  . Allergic asthma, mild intermittent, uncomplicated 09/26/2015  . Colon polyp 09/26/2015  . Synovial cyst of left knee 09/26/2015  . Environmental and seasonal allergies 09/26/2015   Temple PaciniHaley Cydni Reddoch PT, DPT 04/07/2021, 1:18 PM  [facility] [address] [place], [place], [address] Phone: [phone]   Fax:  [phone]  Name: Retia PasseCandice [name] MRN: [id number] Date of Birth: 04/11/1961

## 2021-04-09 ENCOUNTER — Ambulatory Visit: Payer: BC Managed Care – PPO

## 2021-04-10 ENCOUNTER — Ambulatory Visit: Payer: BC Managed Care – PPO

## 2021-04-13 ENCOUNTER — Ambulatory Visit: Payer: BC Managed Care – PPO

## 2021-04-15 ENCOUNTER — Ambulatory Visit: Payer: BC Managed Care – PPO

## 2021-04-15 ENCOUNTER — Encounter: Payer: BC Managed Care – PPO | Admitting: Occupational Therapy

## 2021-04-17 ENCOUNTER — Ambulatory Visit: Payer: BC Managed Care – PPO

## 2021-04-17 ENCOUNTER — Other Ambulatory Visit: Payer: Self-pay

## 2021-04-17 DIAGNOSIS — M25511 Pain in right shoulder: Secondary | ICD-10-CM

## 2021-04-17 DIAGNOSIS — G8929 Other chronic pain: Secondary | ICD-10-CM

## 2021-04-17 DIAGNOSIS — R278 Other lack of coordination: Secondary | ICD-10-CM

## 2021-04-17 DIAGNOSIS — M6281 Muscle weakness (generalized): Secondary | ICD-10-CM

## 2021-04-17 DIAGNOSIS — R2689 Other abnormalities of gait and mobility: Secondary | ICD-10-CM

## 2021-04-17 NOTE — Therapy (Signed)
[place] [facility] MAIN [facility] SERVICES [address] [place], [place], [address] Phone: [phone]   Fax:  [phone]  Physical Therapy Treatment  Patient Details  Name: [name] MRN: [id number] Date of Birth: 01/14/1961 Referring Provider (PT): Tarry Kos, MD   Encounter Date: 04/17/2021   PT End of Session - 04/17/21 1207    Visit Number 3    Number of Visits 25    Date for PT Re-Evaluation 06/26/21    Authorization Type eval performed 04/03/2021    PT Start Time 0816    PT Stop Time 0845    PT Time Calculation (min) 29 min    Activity Tolerance Patient tolerated treatment well    Behavior During Therapy [facility] for tasks assessed/performed           Past Medical History:  Diagnosis Date  . Acute CVA (cerebrovascular accident) (HCC) 12/19/2020  . Arthritis    hands, feet, knees  . Asthma   . Migraine   . Mood change    due to menopause/ hot flashes  . PONV (postoperative nausea and vomiting)     Past Surgical History:  Procedure Laterality Date  . ABDOMINAL HYSTERECTOMY    . APPENDECTOMY    . CESAREAN SECTION     x 1  . COLONOSCOPY  2012   due in 2017 for suspicious lesion  . COLONOSCOPY WITH PROPOFOL N/A 08/20/2016   Procedure: COLONOSCOPY WITH PROPOFOL;  Surgeon: Midge Minium, MD;  Location: [facility];  Service: Endoscopy;  Laterality: N/A;  . PARTIAL HYSTERECTOMY     one ovary left  . REPAIR ANKLE LIGAMENT     from fall  . ROTATOR CUFF REPAIR Left    shoulder  . TONSILLECTOMY      There were no vitals filed for this visit.  Subjective Assessment - 04/17/21 1205    Subjective Pt reports pain level in R shoulder currently 1/10. Pt reports she put on a jacket and it didn't hurt. Pt reports doing HEP.    Pertinent History Pt. is a 60 y.o. female known to PT where she was previously seen secondary to Pontine CVA with right-sided weakness (she also received OT); initially admitted to [facility] on 12/19/20 and transferred  to Inpatient rehab at [facility] where she received therapy for 11 days, after which she attended outpatient neuro. She now returns to PT referred for acute pain of R shoulder/adhesive capsulitis of R shoulder that pt reports started two weeks prior to her stroke after lifting heavy boxes. Pt with hx of migraines, Vit D deficiency, OA, asthma (uncomplicated), L foot pain, prediabetes.    Limitations Lifting;House hold activities;Other (comment)   limits her ability to perform work duties, stand up from the floor, play the drums, dress   How long can you sit comfortably? n/a    How long can you stand comfortably? n/a    How long can you walk comfortably? unaffected    Diagnostic tests none listed    Patient Stated Goals Pt would like to be able to put on/take off her jacket without pain, she would like to be able to play the drums without R shoulder pain    Currently in Pain? Yes    Pain Score 1     Pain Location Shoulder    Pain Orientation Right    Pain Onset More than a month ago           TREATMENT  Seated UBE bike forward/backward 3  min warm-up (unbilled)  Latex-free band. Seated YTB shoulder rows - 1x15, 1x10, 1x8 Seated YTB shoulder IR- 1x15, 1x10 BUEs Seated YTB Shoulder ER - 1x10 BUEs SPC-assisted shoulder IR - 2x10 with last rep with 5-10 sec hold Shrugs - 2x15 BUEs; pt with noticeable delay/decreased AROM on R vs. LUE.   Manual:   Pt supine on plinth with hot pack applied to R shoulder musculature x 11 min as PT provides STM. Once hot pack removed pt skin checked and is WNL.  PT provided STM to R upper trap, RTC/posterior shoulder musculature, R cervical paraspinals x 11 minute.   Education provided throughout session via VC/DEMO to facilitate movement at target joints and correct muscle activation for all testing and exercises performed. Pt exhibits good carryover within session after cuing.      PT Education - 04/17/21 1206    Education Details exercise technique, body  mechanics with rows, shoulder IR    Person(s) Educated Patient    Methods Explanation;Demonstration;Verbal cues    Comprehension Verbalized understanding;Returned demonstration            PT Short Term Goals - 04/03/21 0913      PT SHORT TERM GOAL #1   Title Patient will be independent in home exercise program to improve strength/mobility of RUE for better functional independence with ADLs.    Baseline 5/6: initial HEP issued (pendulums, upper trap stretch, levator scap stretch)    Time 6    Period Weeks    Status New    Target Date 05/15/21             PT Long Term Goals - 04/03/21 0915      PT LONG TERM GOAL #1   Title Patient will increase FOTO score to equal to or greater than to 64 demonstrate statistically significant improvement in mobility and quality of life.    Baseline 5/6: 58% (risk-adjusted 47)    Time 12    Period Weeks    Status New    Target Date 06/26/21      PT LONG TERM GOAL #2   Title Patient will decrease Quick DASH score by > 8 points demonstrating reduced self-reported upper extremity disability.    Baseline 5/6: 43%    Time 12    Period Weeks    Status New    Target Date 06/26/21      PT LONG TERM GOAL #3   Title Patient will decrease Quick DASH Performing Arts module by > 8 points demonstrating improved ability for pt to use RUE to play the drums.    Baseline 04/03/2021: 50%    Time 12    Period Weeks    Status New    Target Date 06/26/21      PT LONG TERM GOAL #4   Title Patient will report a NPR worst pain of 3/10 in RUE over past two weeks in order to improve tolerance with ADLs and reduced symptoms with taking off a jacket.    Baseline 5/6: Pt rates worst pain as 8/10, has difficulty doffing jacket due to RUE mobility restrictions and pain.    Time 12    Period Weeks    Status New    Target Date 06/26/21      PT LONG TERM GOAL #5   Title Patient will increase RUE gross strength to 4+/5 as to improve functional strength and  tolerance for work related tasks.    Baseline 5/6: RUE grossly 4-/5. Pain limited thorughout. Greatest  impairment seen with R shoulder extension, abduction and ER.    Time 12    Period Weeks    Status New    Target Date 06/26/21      PT LONG TERM GOAL #6   Title Pt will demonstrate improvement of at least 15 degrees with R shoulder flexion, abuction, ER, and IR in order to improve ease with bathing.    Baseline 5/6: R shoulder flexion 140 deg.; R shoulder abduction 114 deg.; R shoulder ER 35 deg.; R shoulder IR 60 deg (abduciton, ER and IR are painful)    Time 12    Period Days    Target Date 06/26/21                 Plan - 04/17/21 1207    Clinical Impression Statement Session limited d/t pt arrival time. Pt continues to be highly motivated in PT. She reports no pain with all thereband strengthening exercises performed today. She continues to respond well to [facility]. She exhibits overall progress with RUE ROM and pain by reporting she was able to donn jacket without pain. Pt will benefit from further skilled PT to continue to improve RUE pain, ROM and strength to increase QOL and ease with ADLs.    Personal Factors and Comorbidities Sex;Comorbidity 1;Comorbidity 2;Past/Current Experience;Time since onset of injury/illness/exacerbation    Comorbidities CVA with R side weakness, OA, migraine    Examination-Activity Limitations Bathing;Reach Overhead;Bed Mobility;Dressing;Hygiene/Grooming;Lift;Carry    Examination-Participation Restrictions Cleaning;Laundry;Community Activity;Meal Prep;Shop;Yard Work;Occupation    Stability/Clinical Decision Making Stable/Uncomplicated    Rehab Potential Good    PT Frequency 2x / week    PT Duration 12 weeks    PT Treatment/Interventions ADLs/Self Care Home Management;Biofeedback;Cryotherapy;Electrical Stimulation;Moist Heat;Traction;Ultrasound;Functional mobility training;Therapeutic activities;Therapeutic exercise;Neuromuscular  re-education;Patient/family education;Manual techniques;Passive range of motion;Splinting;Taping;Joint Manipulations;Spinal Manipulations    PT Next Visit Plan mobilizations to tolerance, strengthening, progress shoulder IR, continue progressing resistance used with shoulder strengthening within pain-tolerance    PT Home Exercise Plan Initial HEP Access Code: [email or web address]    Consulted and Agree with Plan of Care Patient           Patient will benefit from skilled therapeutic intervention in order to improve the following deficits and impairments:  Decreased activity tolerance,Decreased endurance,Decreased range of motion,Decreased strength,Hypomobility,Increased fascial restricitons,Impaired UE functional use,Improper body mechanics,Pain,Decreased mobility,Increased muscle spasms,Impaired flexibility  Visit Diagnosis: Chronic right shoulder pain  Muscle weakness (generalized)  Other abnormalities of gait and mobility  Other lack of coordination     Problem List Patient Active Problem List   Diagnosis Date Noted  . History of migraine 02/19/2021  . 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 Slow transit constipation   . Strain of right rotator cuff capsule   . Uncomplicated asthma   . Right hemiparesis (HCC)   . 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 History of migraine headaches 12/23/2020  . Status post total hysterectomy and bilateral salpingo-oophorectomy 09/23/2020  . Generalized osteoarthritis of hand 01/27/2017  . Mood disorder (HCC) 07/13/2016  . Foot pain, left 07/13/2016  . Prediabetes 11/07/2015  . Mixed hyperlipidemia 11/07/2015  . Low serum thyroid stimulating hormone (TSH) 09/30/2015  . Osteoarthritis 09/29/2015  . Menopausal syndrome (hot flashes) 09/29/2015  . Obesity, Class I, BMI 30-34.9 09/26/2015  . Vitamin D deficiency 09/26/2015  . Migraine headache without aura 09/26/2015  . Allergic asthma, mild intermittent, uncomplicated 09/26/2015  . Colon polyp  09/26/2015  . Synovial cyst of left knee 09/26/2015  . Environmental and seasonal allergies 09/26/2015  Temple PaciniHaley Cathalina Barcia PT, DPT 04/17/2021, 12:17 PM  [facility] [facility] [address] [place], [place], [address] Phone: [phone]   Fax:  [phone]  Name: Retia PasseCandice Doyle MRN: [id number] Date of Birth: 02/07/1961

## 2021-04-19 ENCOUNTER — Encounter: Payer: Self-pay | Admitting: Internal Medicine

## 2021-04-20 ENCOUNTER — Ambulatory Visit: Payer: BC Managed Care – PPO

## 2021-04-20 ENCOUNTER — Encounter: Payer: BC Managed Care – PPO | Admitting: Occupational Therapy

## 2021-04-21 ENCOUNTER — Ambulatory Visit: Payer: BC Managed Care – PPO

## 2021-04-23 ENCOUNTER — Encounter: Payer: BC Managed Care – PPO | Admitting: Occupational Therapy

## 2021-04-23 ENCOUNTER — Ambulatory Visit: Payer: BC Managed Care – PPO | Admitting: Physical Medicine & Rehabilitation

## 2021-04-23 ENCOUNTER — Ambulatory Visit: Payer: BC Managed Care – PPO

## 2021-04-24 ENCOUNTER — Ambulatory Visit: Payer: BC Managed Care – PPO

## 2021-04-24 ENCOUNTER — Other Ambulatory Visit: Payer: Self-pay

## 2021-04-24 DIAGNOSIS — G8929 Other chronic pain: Secondary | ICD-10-CM

## 2021-04-24 DIAGNOSIS — M25511 Pain in right shoulder: Secondary | ICD-10-CM | POA: Diagnosis not present

## 2021-04-24 DIAGNOSIS — R2689 Other abnormalities of gait and mobility: Secondary | ICD-10-CM

## 2021-04-24 DIAGNOSIS — M6281 Muscle weakness (generalized): Secondary | ICD-10-CM

## 2021-04-24 NOTE — Therapy (Signed)
[facility] MAIN [facility] SERVICES [address] [place], [place], [address] Phone: [phone]   Fax:  [phone]  Physical Therapy Treatment  Patient Details  Name: [name] MRN: [id number] Date of Birth: 29-Aug-1961 Referring Provider (PT): Tarry Kos, MD   Encounter Date: 04/24/2021   PT End of Session - 04/24/21 0910    Visit Number 4    Number of Visits 25    Date for PT Re-Evaluation 06/26/21    Authorization Type eval performed 04/03/2021    PT Start Time 0804    PT Stop Time 0851    PT Time Calculation (min) 47 min    Activity Tolerance Patient tolerated treatment well    Behavior During Therapy [facility] for tasks assessed/performed           Past Medical History:  Diagnosis Date  . Acute CVA (cerebrovascular accident) (HCC) 12/19/2020  . Arthritis    hands, feet, knees  . Asthma   . Migraine   . Mood change    due to menopause/ hot flashes  . PONV (postoperative nausea and vomiting)     Past Surgical History:  Procedure Laterality Date  . ABDOMINAL HYSTERECTOMY    . APPENDECTOMY    . CESAREAN SECTION     x 1  . COLONOSCOPY  2012   due in 2017 for suspicious lesion  . COLONOSCOPY WITH PROPOFOL N/A 08/20/2016   Procedure: COLONOSCOPY WITH PROPOFOL;  Surgeon: Midge Minium, MD;  Location: [facility];  Service: Endoscopy;  Laterality: N/A;  . PARTIAL HYSTERECTOMY     one ovary left  . REPAIR ANKLE LIGAMENT     from fall  . ROTATOR CUFF REPAIR Left    shoulder  . TONSILLECTOMY      There were no vitals filed for this visit.   Subjective Assessment - 04/24/21 0806    Subjective Pt reports her shoulder is feeling "pretty good," currently. She reports doing HEP.  Pt states reaching behind her back with R UE causes 4-5/10 R shoulder pain. She reports her R shoulder extension and IR has improved.    Pertinent History Pt. is a 60 y.o. female known to PT where she was previously seen secondary to Pont[place] CVA with  right-sided weakness (she also received OT); initially admitted to [facility] on 12/19/20 and transferred to Inpatient rehab at [facility] where she received therapy for 11 days, after which she attended outpatient neuro. She now returns to PT referred for acute pain of R shoulder/adhesive capsulitis of R shoulder that pt reports started two weeks prior to her stroke after lifting heavy boxes. Pt with hx of migraines, Vit D deficiency, OA, asthma (uncomplicated), L foot pain, prediabetes.    Limitations Lifting;House hold activities;Other (comment)   limits her ability to perform work duties, stand up from the floor, play the drums, dress   How long can you sit comfortably? n/a    How long can you stand comfortably? n/a    How long can you walk comfortably? unaffected    Diagnostic tests none listed    Patient Stated Goals Pt would like to be able to put on/take off her jacket without pain, she would like to be able to play the drums without R shoulder pain    Currently in Pain? No/denies    Pain Onset More than a month ago           TREATMENT   Manual: 5 min  Pt supine on plinth.  PT  provides R shoulder distraction in varying degrees of abduction (0-65 deg) for approx 3 min for pain modulation. Pt supine on plinth.  Pt provides posterior and anterior glides to R shoulder for pain modulation for approx 2 min.    Therex:  Matrix - standing rows 2.5# - 1x10, reports of increased discomfort RUE. Placed towel roll under RUE to improve technique. Pt performed 2x10 reports no pain. VC/TC/Demo for technique.  Standing, wall ladder walks for R shoulder flexion and abduction - 6x each. WFL shoulder flexion achieved. Pt able to reach 112 deg. Abduction within pain-free range.     Latex-free bands used for the following: RTB shoulder extension - 1x10; pt rates easy. Demo/TC/VC for technique. GTB shoulder extension - 2x10; rates medium RTB shoulder ER/pull-aparts - 3x10; rates medium RUE RTB shoulder  abduction 2x8, difficulty maintaining technique. Decreased resistance to YTB for 1x10, pt exhibits improved technique. Demo/VC provided for technique.  RTB tricep extension 2x10. VC/Demo for technique RTB bicep curls 2x10; Demo/VC for technique. pt rates easy  Pt seated with hot pack applied to R shoulder musculature x 6 min as she performed the following exercises (once hot pack removed pt skin checked and is WNL, pt reports R shoulder feels better):  Seated upper trap stretch - 2x30 sec B UEs. Demo/VC provided for technique. Seated levator scap stretch - 2x30 sec BUEs. Demo/VC provided for technique. Seated, cervical extensors stretch with initial chin-tuck- 60 sec targeting B mm.  Seated chin tucks 10x. VC/Demo for technique.    Access Code: [email or web address] URL: [email or web address] Date: 04/24/2021 Prepared by: Temple Pacini  Exercises Shoulder extension with resistance - Neutral - 1 x daily - 4 x weekly - 3 sets - 10 reps Standing Bilateral Low Shoulder Row with Anchored Resistance - 1 x daily - 4 x weekly - 3 sets - 10 reps Shoulder External Rotation and Scapular Retraction with Resistance - 1 x daily - 4 x weekly - 3 sets - 10 reps Standing Single Arm Shoulder Abduction with Resistance - 1 x daily - 4 x weekly - 3 sets - 10 reps   Education provided throughout session via VC/DEMO to facilitate movement at target joints and correct muscle activation for all testing and exercises performed. Pt exhibits good carryover within session after cuing.        PT Education - 04/24/21 0909    Education Details Advanced and reviewed HEP, body mechanics, exercise technique    Person(s) Educated Patient    Methods Explanation;Demonstration;Tactile cues;Verbal cues;Handout    Comprehension Verbalized understanding;Returned demonstration            PT Short Term Goals - 04/03/21 0913      PT SHORT TERM GOAL #1   Title Patient will be independent in home exercise program to  improve strength/mobility of RUE for better functional independence with ADLs.    Baseline 5/6: initial HEP issued (pendulums, upper trap stretch, levator scap stretch)    Time 6    Period Weeks    Status New    Target Date 05/15/21             PT Long Term Goals - 04/03/21 0915      PT LONG TERM GOAL #1   Title Patient will increase FOTO score to equal to or greater than to 64 demonstrate statistically significant improvement in mobility and quality of life.    Baseline 5/6: 58% (risk-adjusted 47)    Time 12    Period Weeks  Status New    Target Date 06/26/21      PT LONG TERM GOAL #2   Title Patient will decrease Quick DASH score by > 8 points demonstrating reduced self-reported upper extremity disability.    Baseline 5/6: 43%    Time 12    Period Weeks    Status New    Target Date 06/26/21      PT LONG TERM GOAL #3   Title Patient will decrease Quick DASH Performing Arts module by > 8 points demonstrating improved ability for pt to use RUE to play the drums.    Baseline 04/03/2021: 50%    Time 12    Period Weeks    Status New    Target Date 06/26/21      PT LONG TERM GOAL #4   Title Patient will report a NPR worst pain of 3/10 in RUE over past two weeks in order to improve tolerance with ADLs and reduced symptoms with taking off a jacket.    Baseline 5/6: Pt rates worst pain as 8/10, has difficulty doffing jacket due to RUE mobility restrictions and pain.    Time 12    Period Weeks    Status New    Target Date 06/26/21      PT LONG TERM GOAL #5   Title Patient will increase RUE gross strength to 4+/5 as to improve functional strength and tolerance for work related tasks.    Baseline 5/6: RUE grossly 4-/5. Pain limited thorughout. Greatest impairment seen with R shoulder extension, abduction and ER.    Time 12    Period Weeks    Status New    Target Date 06/26/21      PT LONG TERM GOAL #6   Title Pt will demonstrate improvement of at least 15 degrees with R  shoulder flexion, abuction, ER, and IR in order to improve ease with bathing.    Baseline 5/6: R shoulder flexion 140 deg.; R shoulder abduction 114 deg.; R shoulder ER 35 deg.; R shoulder IR 60 deg (abduciton, ER and IR are painful)    Time 12    Period Days    Target Date 06/26/21                 Plan - 04/24/21 0911    Clinical Impression Statement Pt shows progress with self-reported improvements and observed improvements in R shoulder extension and IR, although ranges are still pain-limited. Pt able to progress to restistance exercises for R shoulder in all planes within pain-limited and pain-free range. Advanced pt's HEP and reviewed exercises/technique. Pt demonstrated good technique and verbalized understanding. Pt R shoulder abduction measured today after multiple reps of wall ladder exercise. She was able to achieve 112 deg. R shoulder abduction. The pt will continue to benefit from further skilled PT to improve RUE pain, strength and ROM in order to increase ease with ADLs and QOL.    Personal Factors and Comorbidities Sex;Comorbidity 1;Comorbidity 2;Past/Current Experience;Time since onset of injury/illness/exacerbation    Comorbidities CVA with R side weakness, OA, migraine    Examination-Activity Limitations Bathing;Reach Overhead;Bed Mobility;Dressing;Hygiene/Grooming;Lift;Carry    Examination-Participation Restrictions Cleaning;Laundry;Community Activity;Meal Prep;Shop;Yard Work;Occupation    Stability/Clinical Decision Making Stable/Uncomplicated    Rehab Potential Good    PT Frequency 2x / week    PT Duration 12 weeks    PT Treatment/Interventions ADLs/Self Care Home Management;Biofeedback;Cryotherapy;Electrical Stimulation;Moist Heat;Traction;Ultrasound;Functional mobility training;Therapeutic activities;Therapeutic exercise;Neuromuscular re-education;Patient/family education;Manual techniques;Passive range of motion;Splinting;Taping;Joint Manipulations;Spinal  Manipulations    PT Next Visit  Plan mobilizations to tolerance, strengthening, progress shoulder IR, continue progressing resistance used with shoulder strengthening within pain-tolerance    PT Home Exercise Plan Initial HEP Access Code: [email or web address]; Advanced HEP 5/27:  Access Code: [email or web address]    Consulted and Agree with Plan of Care Patient           Patient will benefit from skilled therapeutic intervention in order to improve the following deficits and impairments:  Decreased activity tolerance,Decreased endurance,Decreased range of motion,Decreased strength,Hypomobility,Increased fascial restricitons,Impaired UE functional use,Improper body mechanics,Pain,Decreased mobility,Increased muscle spasms,Impaired flexibility  Visit Diagnosis: Chronic right shoulder pain  Muscle weakness (generalized)  Other abnormalities of gait and mobility     Problem List Patient Active Problem List   Diagnosis Date Noted  . History of migraine 02/19/2021  . 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 Slow transit constipation   . Strain of right rotator cuff capsule   . Uncomplicated asthma   . Right hemiparesis (HCC)   . 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 History of migraine headaches 12/23/2020  . Status post total hysterectomy and bilateral salpingo-oophorectomy 09/23/2020  . Generalized osteoarthritis of hand 01/27/2017  . Mood disorder (HCC) 07/13/2016  . Foot pain, left 07/13/2016  . Prediabetes 11/07/2015  . Mixed hyperlipidemia 11/07/2015  . Low serum thyroid stimulating hormone (TSH) 09/30/2015  . Osteoarthritis 09/29/2015  . Menopausal syndrome (hot flashes) 09/29/2015  . Obesity, Class I, BMI 30-34.9 09/26/2015  . Vitamin D deficiency 09/26/2015  . Migraine headache without aura 09/26/2015  . Allergic asthma, mild intermittent, uncomplicated 09/26/2015  . Colon polyp 09/26/2015  . Synovial cyst of left knee 09/26/2015  . Environmental and seasonal allergies 09/26/2015   Temple Pacini PT, DPT 04/24/2021, 10:33 AM  [facility] MAIN [facility] SERVICES [address] [place], [place], [address] Phone: [phone]   Fax:  [phone]  Name: [name] MRN: [id number] Date of Birth: 1961/02/25

## 2021-04-28 ENCOUNTER — Ambulatory Visit: Payer: BC Managed Care – PPO

## 2021-04-29 ENCOUNTER — Ambulatory Visit: Payer: BC Managed Care – PPO

## 2021-04-30 ENCOUNTER — Encounter: Payer: BC Managed Care – PPO | Admitting: Occupational Therapy

## 2021-04-30 ENCOUNTER — Ambulatory Visit: Payer: BC Managed Care – PPO

## 2021-05-01 ENCOUNTER — Other Ambulatory Visit: Payer: Self-pay | Admitting: Internal Medicine

## 2021-05-01 DIAGNOSIS — J3089 Other allergic rhinitis: Secondary | ICD-10-CM

## 2021-05-04 ENCOUNTER — Other Ambulatory Visit: Payer: Self-pay

## 2021-05-04 ENCOUNTER — Ambulatory Visit: Payer: BC Managed Care – PPO | Attending: Orthopaedic Surgery

## 2021-05-04 DIAGNOSIS — M25511 Pain in right shoulder: Secondary | ICD-10-CM | POA: Diagnosis not present

## 2021-05-04 DIAGNOSIS — M6281 Muscle weakness (generalized): Secondary | ICD-10-CM | POA: Diagnosis present

## 2021-05-04 DIAGNOSIS — R2689 Other abnormalities of gait and mobility: Secondary | ICD-10-CM | POA: Insufficient documentation

## 2021-05-04 DIAGNOSIS — G8929 Other chronic pain: Secondary | ICD-10-CM | POA: Diagnosis present

## 2021-05-04 NOTE — Therapy (Signed)
[facility]IN [facility] [address] [place], [place], [address] Phone: [phone]   Fax:  [phone]  Physical Therapy Treatment  Patient Details  Name: [name] PasseCandice Ardoin MRN: [id number] Date of Birth: 01/16/1961 Referring Provider (PT): Tarry KosXu, Naiping M, MD   Encounter Date: 05/04/2021   PT End of Session - 05/04/21 0852    Visit Number 5    Number of Visits 25    Date for PT Re-Evaluation 06/26/21    Authorization Type eval performed 04/03/2021    PT Start Time 0810    PT Stop Time 0846    PT Time Calculation (min) 36 min    Activity Tolerance Patient tolerated treatment well    Behavior During Therapy [facility] for tasks assessed/performed           Past Medical History:  Diagnosis Date  . Acute CVA (cerebrovascular accident) (HCC) 12/19/2020  . Arthritis    hands, feet, knees  . Asthma   . Migraine   . Mood change    due to menopause/ hot flashes  . PONV (postoperative nausea and vomiting)     Past Surgical History:  Procedure Laterality Date  . ABDOMINAL HYSTERECTOMY    . APPENDECTOMY    . CESAREAN SECTION     x 1  . COLONOSCOPY  2012   due in 2017 for suspicious lesion  . COLONOSCOPY WITH PROPOFOL N/A 08/20/2016   Procedure: COLONOSCOPY WITH PROPOFOL;  Surgeon: Midge Miniumarren Wohl, MD;  [facility];  Service: Endoscopy;  Laterality: N/A;  . PARTIAL HYSTERECTOMY     one ovary left  . REPAIR ANKLE LIGAMENT     from fall  . ROTATOR CUFF REPAIR Left    shoulder  . TONSILLECTOMY      There were no vitals filed for this visit.    Subjective Assessment - 05/04/21 0851    Subjective Pt reports R shoulder pain today is 1/10. Pt states "it has been so much better lately." She says she has reduced medication to use of tylenol. Pt reports performing some HEP. Pt reports "I am able to get my clothes on easier."    Pertinent History Pt. is a 60 y.o. female known to PT where she was previously seen secondary to [place]  CVA with right-sided weakness (she also received OT); [facility] on [facility]/19/20 and transferred to [facility] where she received therapy for 11 days, after which she attended outpatient neuro. She now returns to PT referred for acute pain of R shoulder/adhesive capsulitis of R shoulder that pt reports started two weeks prior to her stroke after lifting heavy boxes. Pt with hx of migraines, Vit D deficiency, OA, asthma (uncomplicated), L foot pain, prediabetes.    Limitations Lifting;House hold activities;Other (comment)   limits her ability to perform work duties, stand up from the floor, play the drums, dress   How long can you sit comfortably? n/a    How long can you stand comfortably? n/a    How long can you walk comfortably? unaffected    Diagnostic tests none listed    Patient Stated Goals Pt would like to be able to put on/take off her jacket without pain, she would like to be able to play the drums without R shoulder pain    Currently in Pain? Yes    Pain Score 1     Pain Location Shoulder    Pain Orientation Right    Pain Onset More  than a month ago           TREATMENT   SCIFIT UBE bike - 4 min warm-up, level 3 forward/backward  (unbilled)    Therex:   Matrix - standing rows 2.5# - 1x10, progressed to 7.5# 2x8; pt rates exercise as "medium," VC for technique   Matrix R Shoulder IR - 2.5# 1x10, 1x8  Shoulder extension at Matrix - 2.5# for 1x10, 7.5# for 1x10; pt rates medium  YTB shoulder ER/pull aparts - 15x, 12x, 6x; pt rates medium  Shoulder IR with cane 3 sets of multiple reps. Pt with improved ROM, no pain.  Wall ladder, R shoulder flexion - 4x; pt approx 10 deg from [facility] R shoulder flexion  Ball-rolls outs forward/backward for shoulder flexion - 10x; pt reports feeling stretch  R shoulder abduction slides on plinth - 10x   Hot pack placed to R shoulder mm for the following x 10 min:  Supine pec stretch holding 1# dumbbell in R UE -  2x60 sec   Supine upper trap stretch - 2x30 sec B UEs. VC provided for technique.  Supine levator scap stretch - 2x30 sec BUEs. Demo/VC provided for technique.  Seated chin tucks 10x   Cervical extensor stretch 30 sec  Hot pack removed with skin checked, no adverse reaction observed or reported with tx.   Education provided throughout session via VC/TC and demonstration to facilitate movement at target joints and correct muscle activation for all testing and exercises performed.   PT Education - 05/04/21 0851    Education Details exercise technique, body mechanics with pec stretch    Person(s) Educated Patient    Methods Explanation;Demonstration;Verbal cues    Comprehension Returned demonstration;Verbalized understanding            PT Short Term Goals - 04/03/21 0913      PT SHORT TERM GOAL #1   Title Patient will be independent in home exercise program to improve strength/mobility of RUE for better functional independence with ADLs.    Baseline 5/6: initial HEP issued (pendulums, upper trap stretch, levator scap stretch)    Time 6    Period Weeks    Status New    Target Date 05/15/21             PT Long Term Goals - 04/03/21 0915      PT LONG TERM GOAL #1   Title Patient will increase FOTO score to equal to or greater than to 64 demonstrate statistically significant improvement in mobility and quality of life.    Baseline 5/6: 58% (risk-adjusted 47)    Time 12    Period Weeks    Status New    Target Date 06/26/21      PT LONG TERM GOAL #2   Title Patient will decrease Quick DASH score by > 8 points demonstrating reduced self-reported upper extremity disability.    Baseline 5/6: 43%    Time 12    Period Weeks    Status New    Target Date 06/26/21      PT LONG TERM GOAL #3   Title Patient will decrease Quick DASH Performing Arts module by > 8 points demonstrating improved ability for pt to use RUE to play the drums.    Baseline 04/03/2021: 50%    Time 12     Period Weeks    Status New    Target Date 06/26/21      PT LONG TERM GOAL #4   Title Patient will  report a NPR worst pain of 3/10 in RUE over past two weeks in order to improve tolerance with ADLs and reduced symptoms with taking off a jacket.    Baseline 5/6: Pt rates worst pain as 8/10, has difficulty doffing jacket due to RUE mobility restrictions and pain.    Time 12    Period Weeks    Status New    Target Date 06/26/21      PT LONG TERM GOAL #5   Title Patient will increase RUE gross strength to 4+/5 as to improve functional strength and tolerance for work related tasks.    Baseline 5/6: RUE grossly 4-/5. Pain limited thorughout. Greatest impairment seen with R shoulder extension, abduction and ER.    Time 12    Period Weeks    Status New    Target Date 06/26/21      PT LONG TERM GOAL #6   Title Pt will demonstrate improvement of at least 15 degrees with R shoulder flexion, abuction, ER, and IR in order to improve ease with bathing.    Baseline 5/6: R shoulder flexion 140 deg.; R shoulder abduction 114 deg.; R shoulder ER 35 deg.; R shoulder IR 60 deg (abduciton, ER and IR are painful)    Time 12    Period Days    Target Date 06/26/21                 Plan - 05/04/21 3810    Clinical Impression Statement Session somewhat limited secondary to pt arrival time. Pt is highly motivated throughout session, however, and reports overall progress with R shoulder pain and functional mobility. Pt able to progress resistance used with shoulder extension based exerises without pain. The pt will benefit from further skilled therapy to continue to improve R shoulder pain, strength and ROM in order to increase ease with ADLs and QOL.    Personal Factors and Comorbidities Sex;Comorbidity 1;Comorbidity 2;Past/Current Experience;Time since onset of injury/illness/exacerbation    Comorbidities CVA with R side weakness, OA, migraine    Examination-Activity Limitations Bathing;Reach  Overhead;Bed Mobility;Dressing;Hygiene/Grooming;Lift;Carry    Examination-Participation Restrictions Cleaning;Laundry;Community Activity;Meal Prep;Shop;Yard Work;Occupation    Stability/Clinical Decision Making Stable/Uncomplicated    Rehab Potential Good    PT Frequency 2x / week    PT Duration 12 weeks    PT Treatment/Interventions ADLs/Self Care Home Management;Biofeedback;Cryotherapy;Electrical Stimulation;Moist Heat;Traction;Ultrasound;Functional mobility training;Therapeutic activities;Therapeutic exercise;Neuromuscular re-education;Patient/family education;Manual techniques;Passive range of motion;Splinting;Taping;Joint Manipulations;Spinal Manipulations    PT Next Visit Plan mobilizations to tolerance, strengthening, progress shoulder IR, continue progressing resistance used with shoulder strengthening within pain-tolerance, continue POC as previously indicated    PT Home Exercise Plan Initial HEP Access Code: [email or web address]; Advanced HEP 5/27:  Access Code: [email or web address]; supine pec stretch    Consulted and Agree with Plan of Care Patient           Patient will benefit from skilled therapeutic intervention in order to improve the following deficits and impairments:  Decreased activity tolerance,Decreased endurance,Decreased range of motion,Decreased strength,Hypomobility,Increased fascial restricitons,Impaired UE functional use,Improper body mechanics,Pain,Decreased mobility,Increased muscle spasms,Impaired flexibility  Visit Diagnosis: Chronic right shoulder pain  Muscle weakness (generalized)  Other abnormalities of gait and mobility     Problem List Patient Active Problem List   Diagnosis Date Noted  . History of migraine 02/19/2021  . 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 Slow transit constipation   . Strain of right rotator cuff capsule   . Uncomplicated asthma   . Right hemiparesis (HCC)   . Left pontine stroke (HCC)  12/26/2020  . History of migraine headaches 12/23/2020  .  Status post total hysterectomy and bilateral salpingo-oophorectomy 09/23/2020  . Generalized osteoarthritis of hand 01/27/2017  . Mood disorder (HCC) 07/13/2016  . Foot pain, left 07/13/2016  . Prediabetes 11/07/2015  . Mixed hyperlipidemia 11/07/2015  . Low serum thyroid stimulating hormone (TSH) 09/30/2015  . Osteoarthritis 09/29/2015  . Menopausal syndrome (hot flashes) 09/29/2015  . Obesity, Class I, BMI 30-34.9 09/26/2015  . Vitamin D deficiency 09/26/2015  . Migraine headache without aura 09/26/2015  . Allergic asthma, mild intermittent, uncomplicated 09/26/2015  . Colon polyp 09/26/2015  . Synovial cyst of left knee 09/26/2015  . Environmental and seasonal allergies 09/26/2015   Temple Pacini PT, DPT 05/04/2021, 11:15 AM  [facility] MAIN [facility] SERVICES [address] [place], [place], [address] Phone: [phone]   Fax:  [phone]  Name: Ruther Ephraim MRN: [id number] Date of Birth: [birth year]

## 2021-05-05 ENCOUNTER — Encounter: Payer: BC Managed Care – PPO | Admitting: Occupational Therapy

## 2021-05-05 ENCOUNTER — Ambulatory Visit: Payer: BC Managed Care – PPO

## 2021-05-07 ENCOUNTER — Ambulatory Visit: Payer: BC Managed Care – PPO

## 2021-05-07 ENCOUNTER — Encounter: Payer: BC Managed Care – PPO | Admitting: Occupational Therapy

## 2021-05-11 ENCOUNTER — Encounter: Payer: BC Managed Care – PPO | Admitting: Occupational Therapy

## 2021-05-11 ENCOUNTER — Ambulatory Visit: Payer: BC Managed Care – PPO

## 2021-05-12 ENCOUNTER — Ambulatory Visit: Payer: BC Managed Care – PPO

## 2021-05-13 ENCOUNTER — Encounter: Payer: BC Managed Care – PPO | Admitting: Occupational Therapy

## 2021-05-13 ENCOUNTER — Ambulatory Visit: Payer: BC Managed Care – PPO

## 2021-05-14 ENCOUNTER — Other Ambulatory Visit: Payer: Self-pay

## 2021-05-14 ENCOUNTER — Ambulatory Visit: Payer: BC Managed Care – PPO

## 2021-05-14 DIAGNOSIS — R2689 Other abnormalities of gait and mobility: Secondary | ICD-10-CM

## 2021-05-14 DIAGNOSIS — G8929 Other chronic pain: Secondary | ICD-10-CM

## 2021-05-14 DIAGNOSIS — M25511 Pain in right shoulder: Secondary | ICD-10-CM | POA: Diagnosis not present

## 2021-05-14 DIAGNOSIS — M6281 Muscle weakness (generalized): Secondary | ICD-10-CM

## 2021-05-14 NOTE — Therapy (Signed)
[place] [facility] MAIN [facility] [address] [place], [place], [address] Phone: [phone]   Fax:  [phone]  Physical Therapy Treatment  Patient Details  Name: [name] MRN: [id number] Date of Birth: 06/17/1961 Referring Provider (PT): Tarry KosXu, Naiping M, MD   Encounter Date: 05/14/2021   PT End of Session - 05/14/21 1031     Visit Number 6    Number of Visits 25    Date for PT Re-Evaluation 06/26/21    Authorization Type eval performed 04/03/2021    PT Start Time 0939    PT Stop Time 1015    PT Time Calculation (min) 36 min    Activity Tolerance Patient tolerated treatment well    Behavior [facility] for tasks assessed/performed             Past Medical History:  Diagnosis Date   Acute CVA (cerebrovascular accident) (HCC) 12/19/2020   Arthritis    hands, feet, knees   Asthma    Migraine    Mood change    due to menopause/ hot flashes   PONV (postoperative nausea and vomiting)     Past Surgical History:  Procedure Laterality Date   ABDOMINAL HYSTERECTOMY     APPENDECTOMY     CESAREAN SECTION     x 1   COLONOSCOPY  2012   due in 2017 for suspicious lesion   COLONOSCOPY WITH PROPOFOL N/A 08/20/2016   Procedure: COLONOSCOPY WITH PROPOFOL;  Surgeon: Midge Miniumarren Wohl, MD;  [facility];  Service: Endoscopy;  Laterality: N/A;   PARTIAL HYSTERECTOMY     one ovary left   REPAIR ANKLE LIGAMENT     from fall   ROTATOR CUFF REPAIR Left    shoulder   TONSILLECTOMY      There were no vitals filed for this visit.   Subjective Assessment - 05/14/21 0939     Subjective Pt reports she is able to sleep on R side for about 45 min -1 hr before she is woken up and has to turn over. Pt rates average R shoulder pain over past 7 days as a 2/10. Current pain is 2/10. She reports she has had stressful week and she thinks that has contributed to pain.    Patient is accompained by: Family member    Pertinent History  Pt. is a 60 y.o. female known to PT where she was previously seen secondary to Pontine CVA with right-sided weakness (she also received OT); [facility] and [facility] where she received therapy for 11 days, after which she attended outpatient neuro. She now returns to PT referred for acute pain of R shoulder/adhesive capsulitis of R shoulder that pt reports started two weeks prior to her stroke after lifting heavy boxes. Pt with hx of migraines, Vit D deficiency, OA, asthma (uncomplicated), L foot pain, prediabetes.    Limitations Lifting;House hold activities;Other (comment)   limits her ability to perform work duties, stand up from the floor, play the drums, dress   How long can you sit comfortably? n/a    How long can you stand comfortably? n/a    How long can you walk comfortably? unaffected    Diagnostic tests none listed    Patient Stated Goals Pt would like to be able to put on/take off her jacket without pain, she would like to be able to play the drums without R shoulder pain    Currently in  Pain? Yes    Pain Score 2     Pain Location Shoulder    Pain Orientation Right    Pain Onset More than a month ago    Pain Onset In the past 7 days             TREATMENT   THEREX-  Pendulums - CW/CC, side-to-side, forward/backward for R shoulder - 10x for each.  Standing scapular retractions (elbows flexed, at sides) - 10x.  Seated physioball forward/backward rollouts, side-to-side - 10x each and each direction with 3-5 sec holds.   R shoulder AROM assessment: -shoulder flexion 145. Deg -shoulder abduction 150 deg.   Standing, SPC-assisted R shoulder IR - 2x10    Standing at Matrix cable machine -Shoulder ext. (B), 2.5# - 1x10; progressed to 7.5# 2x10; pt rates exercise as "medium" -Standing rows 2.5# 1x15; progressed to 4.5# for 1x15; pt rates exercise as "medium," increased VC/TC for technique -R shoulder IR 2.5#  3x8  RTB (latex free) R shoulder ER - 2x10, 1x8; increased VC for technique.  RTB (latex free) wall-walks - 4 min  Supine pec stretch - 1x90 sec  Education provided throughout session via VC/TC and demonstration to facilitate movement at target joints and correct muscle activation for all testing and exercises performed.       PT Education - 05/14/21 1033     Education Details exercise technique, body mechanics    Person(s) Educated Patient    Methods Explanation;Demonstration;Verbal cues    Comprehension Verbalized understanding;Returned demonstration              PT Short Term Goals - 04/03/21 0913       PT SHORT TERM GOAL #1   Title Patient will be independent in home exercise program to improve strength/mobility of RUE for better functional independence with ADLs.    Baseline 5/6: initial HEP issued (pendulums, upper trap stretch, levator scap stretch)    Time 6    Period Weeks    Status New    Target Date 05/15/21               PT Long Term Goals - 04/03/21 0915       PT LONG TERM GOAL #1   Title Patient will increase FOTO score to equal to or greater than to 64 demonstrate statistically significant improvement in mobility and quality of life.    Baseline 5/6: 58% (risk-adjusted 47)    Time 12    Period Weeks    Status New    Target Date 06/26/21      PT LONG TERM GOAL #2   Title Patient will decrease Quick DASH score by > 8 points demonstrating reduced self-reported upper extremity disability.    Baseline 5/6: 43%    Time 12    Period Weeks    Status New    Target Date 06/26/21      PT LONG TERM GOAL #3   Title Patient will decrease Quick DASH Performing Arts module by > 8 points demonstrating improved ability for pt to use RUE to play the drums.    Baseline 04/03/2021: 50%    Time 12    Period Weeks    Status New    Target Date 06/26/21      PT LONG TERM GOAL #4   Title Patient will report a NPR worst pain of 3/10 in RUE over past two  weeks in order to improve tolerance with ADLs and reduced symptoms with taking off a jacket.  Baseline 5/6: Pt rates worst pain as 8/10, has difficulty doffing jacket due to RUE mobility restrictions and pain.    Time 12    Period Weeks    Status New    Target Date 06/26/21      PT LONG TERM GOAL #5   Title Patient will increase RUE gross strength to 4+/5 as to improve functional strength and tolerance for work related tasks.    Baseline 5/6: RUE grossly 4-/5. Pain limited thorughout. Greatest impairment seen with R shoulder extension, abduction and ER.    Time 12    Period Weeks    Status New    Target Date 06/26/21      PT LONG TERM GOAL #6   Title Pt will demonstrate improvement of at least 15 degrees with R shoulder flexion, abuction, ER, and IR in order to improve ease with bathing.    Baseline 5/6: R shoulder flexion 140 deg.; R shoulder abduction 114 deg.; R shoulder ER 35 deg.; R shoulder IR 60 deg (abduciton, ER and IR are painful)    Time 12    Period Days    Target Date 06/26/21                   Plan - 05/14/21 1033     Clinical Impression Statement Pt continues to make gains in therapy with increasing resistance and volume overall for therex. She also reports average pain in R shoulder has decreased. Current AROM R shoulder flexion is 145 deg., and R shoulder abduction is 150 deg. Pt most challenged with combination shoulder ext. and IR AROM. The pt will benefit from further skilled PT to improve pain, RUE strength and ROM to increase ease with functional mobilty and QOL.    Personal Factors and Comorbidities Sex;Comorbidity 1;Comorbidity 2;Past/Current Experience;Time since onset of injury/illness/exacerbation    Comorbidities CVA with R side weakness, OA, migraine    Examination-Activity Limitations Bathing;Reach Overhead;Bed Mobility;Dressing;Hygiene/Grooming;Lift;Carry    Examination-Participation Restrictions Cleaning;Laundry;Community Activity;Meal  Prep;Shop;Yard Work;Occupation    Stability/Clinical Decision Making Stable/Uncomplicated    Rehab Potential Good    PT Frequency 2x / week    PT Duration 12 weeks    PT Treatment/Interventions ADLs/Self Care Home Management;Biofeedback;Cryotherapy;Electrical Stimulation;Moist Heat;Traction;Ultrasound;Functional mobility training;Therapeutic activities;Therapeutic exercise;Neuromuscular re-education;Patient/family education;Manual techniques;Passive range of motion;Splinting;Taping;Joint Manipulations;Spinal Manipulations    PT Next Visit Plan mobilizations to tolerance, strengthening, progress shoulder IR, continue progressing resistance used with shoulder strengthening within pain-tolerance, continue POC as previously indicated    PT Home Exercise Plan Initial HEP Access Code: [email or web address]; Advanced HEP 5/27:  Access Code: [email or web address]; supine pec stretch    Consulted and Agree with Plan of Care Patient             Patient will benefit from skilled therapeutic intervention in order to improve the following deficits and impairments:  Decreased activity tolerance, Decreased endurance, Decreased range of motion, Decreased strength, Hypomobility, Increased fascial restricitons, Impaired UE functional use, Improper body mechanics, Pain, Decreased mobility, Increased muscle spasms, Impaired flexibility  Visit Diagnosis: Chronic right shoulder pain  Muscle weakness (generalized)  Other abnormalities of gait and mobility     Problem List Patient Active Problem List   Diagnosis Date Noted   History of migraine 02/19/2021   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   Slow transit constipation    Strain of right rotator cuff capsule    Uncomplicated asthma    Right hemiparesis (HCC)    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   History of migraine headaches 12/23/2020  Status post total hysterectomy and bilateral salpingo-oophorectomy 09/23/2020   Generalized osteoarthritis of hand 01/27/2017   Mood  disorder (HCC) 07/13/2016   Foot pain, left 07/13/2016   Prediabetes 11/07/2015   Mixed hyperlipidemia 11/07/2015   Low serum thyroid stimulating hormone (TSH) 09/30/2015   Osteoarthritis 09/29/2015   Menopausal syndrome (hot flashes) 09/29/2015   Obesity, Class I, BMI 30-34.9 09/26/2015   Vitamin D deficiency 09/26/2015   Migraine headache without aura 09/26/2015   Allergic asthma, mild intermittent, uncomplicated 09/26/2015   Colon polyp 09/26/2015   Synovial cyst of left knee 09/26/2015   Environmental and seasonal allergies 09/26/2015   Temple Pacini PT, DPT 05/14/2021, 10:43 AM  [place] [facility] MAIN [facility] SERVICES [address] [place], [place], [address] Phone: [phone]   Fax:  [phone]  Name: [name] MRN: [id number] Date of Birth: [birth year]

## 2021-05-18 ENCOUNTER — Ambulatory Visit: Payer: BC Managed Care – PPO

## 2021-05-19 ENCOUNTER — Ambulatory Visit: Payer: BC Managed Care – PPO

## 2021-05-20 ENCOUNTER — Ambulatory Visit: Payer: BC Managed Care – PPO

## 2021-05-21 ENCOUNTER — Ambulatory Visit: Payer: BC Managed Care – PPO

## 2021-05-22 ENCOUNTER — Ambulatory Visit: Payer: BC Managed Care – PPO

## 2021-05-26 ENCOUNTER — Encounter: Payer: BC Managed Care – PPO | Admitting: Occupational Therapy

## 2021-05-26 ENCOUNTER — Other Ambulatory Visit: Payer: Self-pay

## 2021-05-26 ENCOUNTER — Ambulatory Visit: Payer: BC Managed Care – PPO

## 2021-05-26 DIAGNOSIS — M6281 Muscle weakness (generalized): Secondary | ICD-10-CM

## 2021-05-26 DIAGNOSIS — M25511 Pain in right shoulder: Secondary | ICD-10-CM | POA: Diagnosis not present

## 2021-05-26 DIAGNOSIS — R2689 Other abnormalities of gait and mobility: Secondary | ICD-10-CM

## 2021-05-26 NOTE — Therapy (Signed)
[facility] MAIN [facility] SERVICES [address] [place], [place], [address] Phone: [phone]   Fax:  [phone]  Physical Therapy Treatment  Patient Details  Name: [name] MRN: [id number] Date of Birth: Aug 16, 1961 Referring Provider (PT): Tarry Kos, MD   Encounter Date: 05/26/2021   PT End of Session - 05/26/21 1100     Visit Number 7    Number of Visits 25    Date for PT Re-Evaluation 06/26/21    Authorization Type eval performed 04/03/2021    PT Start Time 0859    PT Stop Time 0930    PT Time Calculation (min) 31 min    Activity Tolerance Patient tolerated treatment well    Behavior During Therapy [facility] for tasks assessed/performed             Past Medical History:  Diagnosis Date   Acute CVA (cerebrovascular accident) (HCC) 12/19/2020   Arthritis    hands, feet, knees   Asthma    Migraine    Mood change    due to menopause/ hot flashes   PONV (postoperative nausea and vomiting)     Past Surgical History:  Procedure Laterality Date   ABDOMINAL HYSTERECTOMY     APPENDECTOMY     CESAREAN SECTION     x 1   COLONOSCOPY  2012   due in 2017 for suspicious lesion   COLONOSCOPY WITH PROPOFOL N/A 08/20/2016   Procedure: COLONOSCOPY WITH PROPOFOL;  Surgeon: Midge Minium, MD;  [facility];  Service: Endoscopy;  Laterality: N/A;   PARTIAL HYSTERECTOMY     one ovary left   REPAIR ANKLE LIGAMENT     from fall   ROTATOR CUFF REPAIR Left    shoulder   TONSILLECTOMY      There were no vitals filed for this visit.   Subjective Assessment - 05/26/21 0901     Subjective Pt reports shoulder R shoulder is "good" today and rates current pain as 0/10.  Pt reports she almost slept half the night on her R shoulder. She stlil has most trouble with R shoulder IR.    Patient is accompained by: Family member    Pertinent History Pt. is a 60 y.o. female known to PT where she was previously seen secondary to Pontine CVA  with right-sided weakness (she also received OT); initially admitted to [facility] on 12/19/20 and transferred to Inpatient rehab at [facility] where she received therapy for 11 days, after which she attended outpatient neuro. She now returns to PT referred for acute pain of R shoulder/adhesive capsulitis of R shoulder that pt reports started two weeks prior to her stroke after lifting heavy boxes. Pt with hx of migraines, Vit D deficiency, OA, asthma (uncomplicated), L foot pain, prediabetes.    Limitations Lifting;House hold activities;Other (comment)   limits her ability to perform work duties, stand up from the floor, play the drums, dress   How long can you sit comfortably? n/a    How long can you stand comfortably? n/a    How long can you walk comfortably? unaffected    Diagnostic tests none listed    Patient Stated Goals Pt would like to be able to put on/take off her jacket without pain, she would like to be able to play the drums without R shoulder pain    Currently in Pain? No/denies    Pain Onset More than a month ago    Pain Onset In the past 7 days  TREATMENT  Therex- PT instruct pt through warm-up: Scapular squeezes 15x B; TC for technique Shrugs 10x B R shoulder pendulums - CW/CC, side-to-side, forward/backward for R shoulder - 10x for each  Standing at Matrix cable machine: -B shoulder extension - 2.5# - 1x10; progressed to 7.5# 2x10; pt continues to rate exercise as "medium". -B shoulder rows -  7.5# 3x10; pt rates "medium" -R shoulder ER/IR 2.5# 2x10 for each. Pt reports fatigue.  -R shoulder pendulums CW/CC 1x10 for each  Wall ladder abduction/flexion 1x for each, with pt maintaining end-range position for 20-30 sec for each. RTB R shoulder abduction 1x8, and RTB R shoulder flexion 1x6. Pt reports fatigue.   Shoulder IR with SPC - 2x10 with 20-30 sec hold with last rep for stretch. Attempted with cloth, but pt unable to maintain position.  30 sec for each: Upper  trap stretch - performed each side Levator scap stretch - performed each side Cervical extensors stretch  Education provided throughout session via VC/TC and demonstration to facilitate movement at target joints and correct muscle activation for all testing and exercises performed.     PT Education - 05/26/21 1100     Education Details exercise technique, body mechanics    Person(s) Educated Patient    Methods Explanation;Demonstration;Tactile cues;Verbal cues    Comprehension Returned demonstration;Verbalized understanding              PT Short Term Goals - 04/03/21 0913       PT SHORT TERM GOAL #1   Title Patient will be independent in home exercise program to improve strength/mobility of RUE for better functional independence with ADLs.    Baseline 5/6: initial HEP issued (pendulums, upper trap stretch, levator scap stretch)    Time 6    Period Weeks    Status New    Target Date 05/15/21               PT Long Term Goals - 04/03/21 0915       PT LONG TERM GOAL #1   Title Patient will increase FOTO score to equal to or greater than to 64 demonstrate statistically significant improvement in mobility and quality of life.    Baseline 5/6: 58% (risk-adjusted 47)    Time 12    Period Weeks    Status New    Target Date 06/26/21      PT LONG TERM GOAL #2   Title Patient will decrease Quick DASH score by > 8 points demonstrating reduced self-reported upper extremity disability.    Baseline 5/6: 43%    Time 12    Period Weeks    Status New    Target Date 06/26/21      PT LONG TERM GOAL #3   Title Patient will decrease Quick DASH Performing Arts module by > 8 points demonstrating improved ability for pt to use RUE to play the drums.    Baseline 04/03/2021: 50%    Time 12    Period Weeks    Status New    Target Date 06/26/21      PT LONG TERM GOAL #4   Title Patient will report a NPR worst pain of 3/10 in RUE over past two weeks in order to improve tolerance  with ADLs and reduced symptoms with taking off a jacket.    Baseline 5/6: Pt rates worst pain as 8/10, has difficulty doffing jacket due to RUE mobility restrictions and pain.    Time 12    Period Weeks  Status New    Target Date 06/26/21      PT LONG TERM GOAL #5   Title Patient will increase RUE gross strength to 4+/5 as to improve functional strength and tolerance for work related tasks.    Baseline 5/6: RUE grossly 4-/5. Pain limited thorughout. Greatest impairment seen with R shoulder extension, abduction and ER.    Time 12    Period Weeks    Status New    Target Date 06/26/21      PT LONG TERM GOAL #6   Title Pt will demonstrate improvement of at least 15 degrees with R shoulder flexion, abuction, ER, and IR in order to improve ease with bathing.    Baseline 5/6: R shoulder flexion 140 deg.; R shoulder abduction 114 deg.; R shoulder ER 35 deg.; R shoulder IR 60 deg (abduciton, ER and IR are painful)    Time 12    Period Days    Target Date 06/26/21                   Plan - 05/26/21 1101     Clinical Impression Statement Session limited d/t pt arrival time. Pt continues to demonstrate progress in PT with reports of 0/10 R shoulder pain today and by reporting improved ease with performing ADLs with RUE. Although pt demonstrates progress, she still has impaired R shoulder IR and R shoulder RTC musculature fatigues quickly with therex. The pt will continue to benefit from further skilled PT to maintain improvements in pain, and to increase RUE strength and ROM to increase ease with ADLs.    Personal Factors and Comorbidities Sex;Comorbidity 1;Comorbidity 2;Past/Current Experience;Time since onset of injury/illness/exacerbation    Comorbidities CVA with R side weakness, OA, migraine    Examination-Activity Limitations Bathing;Reach Overhead;Bed Mobility;Dressing;Hygiene/Grooming;Lift;Carry    Examination-Participation Restrictions Cleaning;Laundry;Community Activity;Meal  Prep;Shop;Yard Work;Occupation    Stability/Clinical Decision Making Stable/Uncomplicated    Rehab Potential Good    PT Frequency 2x / week    PT Duration 12 weeks    PT Treatment/Interventions ADLs/Self Care Home Management;Biofeedback;Cryotherapy;Electrical Stimulation;Moist Heat;Traction;Ultrasound;Functional mobility training;Therapeutic activities;Therapeutic exercise;Neuromuscular re-education;Patient/family education;Manual techniques;Passive range of motion;Splinting;Taping;Joint Manipulations;Spinal Manipulations    PT Next Visit Plan mobilizations to tolerance, strengthening, progress shoulder IR, continue progressing resistance used with shoulder strengthening within pain-tolerance, continue POC as previously indicated    PT Home Exercise Plan Initial HEP Access Code: [email or web address]; Advanced HEP 5/27:  Access Code: [email or web address]; supine pec stretch, no changes on this date    Consulted and Agree with Plan of Care Patient             Patient will benefit from skilled therapeutic intervention in order to improve the following deficits and impairments:  Decreased activity tolerance, Decreased endurance, Decreased range of motion, Decreased strength, Hypomobility, Increased fascial restricitons, Impaired UE functional use, Improper body mechanics, Pain, Decreased mobility, Increased muscle spasms, Impaired flexibility  Visit Diagnosis: Muscle weakness (generalized)  Other abnormalities of gait and mobility     Problem List Patient Active Problem List   Diagnosis Date Noted   History of migraine 02/19/2021   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   Slow transit constipation    Strain of right rotator cuff capsule    Uncomplicated asthma    Right hemiparesis (HCC)    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   History of migraine headaches 12/23/2020   Status post total hysterectomy and bilateral salpingo-oophorectomy 09/23/2020   Generalized osteoarthritis of hand 01/27/2017   Mood  disorder (HCC) 07/13/2016   Foot  pain, left 07/13/2016   Prediabetes 11/07/2015   Mixed hyperlipidemia 11/07/2015   Low serum thyroid stimulating hormone (TSH) 09/30/2015   Osteoarthritis 09/29/2015   Menopausal syndrome (hot flashes) 09/29/2015   Obesity, Class I, BMI 30-34.9 09/26/2015   Vitamin D deficiency 09/26/2015   Migraine headache without aura 09/26/2015   Allergic asthma, mild intermittent, uncomplicated 09/26/2015   Colon polyp 09/26/2015   Synovial cyst of left knee 09/26/2015   Environmental and seasonal allergies 09/26/2015   Temple Pacini PT, DPT 05/26/2021, 11:09 AM  [place] [facility] MAIN [facility] SERVICES [address] [place], [place], [address] Phone: [phone]   Fax:  [phone]  Name: [name] MRN: [id number] Date of Birth: [birth year]

## 2021-05-28 ENCOUNTER — Ambulatory Visit: Payer: BC Managed Care – PPO

## 2021-05-28 ENCOUNTER — Encounter: Payer: BC Managed Care – PPO | Admitting: Occupational Therapy

## 2021-06-02 ENCOUNTER — Ambulatory Visit: Payer: BC Managed Care – PPO

## 2021-06-04 ENCOUNTER — Ambulatory Visit: Payer: BC Managed Care – PPO

## 2021-06-09 ENCOUNTER — Ambulatory Visit: Payer: BC Managed Care – PPO

## 2021-06-11 ENCOUNTER — Encounter: Payer: Self-pay | Admitting: Internal Medicine

## 2021-06-11 ENCOUNTER — Other Ambulatory Visit: Payer: Self-pay

## 2021-06-11 DIAGNOSIS — F39 Unspecified mood [affective] disorder: Secondary | ICD-10-CM

## 2021-06-11 MED ORDER — CITALOPRAM HYDROBROMIDE 20 MG PO TABS: 30.0000 mg | ORAL_TABLET | Freq: Every day | ORAL | 0 refills | Status: DC

## 2021-06-16 ENCOUNTER — Other Ambulatory Visit: Payer: Self-pay

## 2021-06-16 ENCOUNTER — Ambulatory Visit: Payer: BC Managed Care – PPO | Attending: Orthopaedic Surgery

## 2021-06-16 DIAGNOSIS — M6281 Muscle weakness (generalized): Secondary | ICD-10-CM | POA: Diagnosis not present

## 2021-06-16 NOTE — Therapy (Signed)
[place] MAIN [facility] SERVICES [address] [place], [place], [address] Phone: [phone]   Fax:  [phone]  Physical Therapy Treatment/DISCHARGE SUMMARY  Patient Details  Name: [name] MRN: [id number] Date of Birth: 1961-07-17 Referring Provider (PT): Leandrew Koyanagi, MD   Encounter Date: 06/16/2021   PT End of Session - 06/16/21 0846     Visit Number 8    Number of Visits 25    Date for PT Re-Evaluation 06/26/21    Authorization Type eval performed 04/03/2021    PT Start Time 0809    PT Stop Time 0839    PT Time Calculation (min) 30 min    Activity Tolerance Patient tolerated treatment well    Behavior During Therapy [facility] for tasks assessed/performed             Past Medical History:  Diagnosis Date   Acute CVA (cerebrovascular accident) ([place]) 12/19/2020   Arthritis    hands, feet, knees   Asthma    Migraine    Mood change    due to menopause/ hot flashes   PONV (postoperative nausea and vomiting)     Past Surgical History:  Procedure Laterality Date   ABDOMINAL HYSTERECTOMY     APPENDECTOMY     CESAREAN SECTION     x 1   COLONOSCOPY  2012   due in 2017 for suspicious lesion   COLONOSCOPY WITH PROPOFOL N/A 08/20/2016   Procedure: COLONOSCOPY WITH PROPOFOL;  Surgeon: Lucilla Lame, MD;  Location: [place];  Service: Endoscopy;  Laterality: N/A;   PARTIAL HYSTERECTOMY     one ovary left   REPAIR ANKLE LIGAMENT     from fall   ROTATOR CUFF REPAIR Left    shoulder   TONSILLECTOMY      There were no vitals filed for this visit.   Subjective Assessment - 06/16/21 0810     Subjective Pt reports she has had to miss multiple sessions d/t job obligations. She reports stress from work. Pt reports 0/10 R shoulder pain currently. She says she's starting sleep on her R side. Still has some difficulty with shoulder IR, but reports "I feel normal again."    Patient is accompained by: Family member    Pertinent  History Pt. is a 60 y.o. female known to PT where she was previously seen secondary to [place] CVA with right-sided weakness (she also received OT); initially admitted to [facility] on 12/19/20 and transferred to Inpatient rehab at [facility] where she received therapy for 11 days, after which she attended outpatient neuro. She now returns to PT referred for acute pain of R shoulder/adhesive capsulitis of R shoulder that pt reports started two weeks prior to her stroke after lifting heavy boxes. Pt with hx of migraines, Vit D deficiency, OA, asthma (uncomplicated), L foot pain, prediabetes.    Limitations Lifting;House hold activities;Other (comment)   limits her ability to perform work duties, stand up from the floor, play the drums, dress   How long can you sit comfortably? n/a    How long can you stand comfortably? n/a    How long can you walk comfortably? unaffected    Diagnostic tests none listed    Patient Stated Goals Pt would like to be able to put on/take off her jacket without pain, she would like to be able to play the drums without R shoulder pain    Currently in Pain? No/denies    Pain Onset More than a month  ago    Pain Onset In the past 7 days              TREATMENT - reassessment of goals for d/c  FOTO- 72   AROM of RUE: 150 deg shoulder flexion, 150 deg abduction, 50 deg ER, 40 deg. IR  MMT: grossly 4+/5 RUE  QuickDash Disability/Symptom Score: 2.27% QuickDash Work module: 0% QuickDash Sports/performing arts: 0%    PT Education - 06/16/21 0849     Education Details fingings of goal retesting, indications for progress and D/C from PT, instruction to contact PT should she have questions after d/c from PT    Person(s) Educated Patient    Methods Explanation    Comprehension Verbalized understanding              PT Short Term Goals - 06/16/21 0813       PT SHORT TERM GOAL #1   Title Patient will be independent in home exercise program to improve strength/mobility  of RUE for better functional independence with ADLs.    Baseline 5/6: initial HEP issued (pendulums, upper trap stretch, levator scap stretch); 7/19: Pt reports independence    Time 6    Period Weeks    Status Achieved    Target Date 05/15/21               PT Long Term Goals - 06/16/21 0813       PT LONG TERM GOAL #1   Title Patient will increase FOTO score to equal to or greater than to 64 demonstrate statistically significant improvement in mobility and quality of life.    Baseline 5/6: 58% (risk-adjusted 47); 7/19: 72%    Time 12    Period Weeks    Status Achieved      PT LONG TERM GOAL #2   Title Patient will decrease Quick DASH score by > 8 points demonstrating reduced self-reported upper extremity disability.    Baseline 5/6: 43%; 7/19: 2.27%    Time 12    Period Weeks    Status Achieved      PT LONG TERM GOAL #3   Title Patient will decrease Quick DASH Performing Arts module by > 8 points demonstrating improved ability for pt to use RUE to play the drums.    Baseline 04/03/2021: 50%; 7/19: 0%    Time 12    Period Weeks    Status Achieved      PT LONG TERM GOAL #4   Title Patient will report a NPR worst pain of 3/10 in RUE over past two weeks in order to improve tolerance with ADLs and reduced symptoms with taking off a jacket.    Baseline 5/6: Pt rates worst pain as 8/10, has difficulty doffing jacket due to RUE mobility restrictions and pain.; 7/19: pt reports no pain over past several weeks    Time 12    Period Weeks    Status Achieved      PT LONG TERM GOAL #5   Title Patient will increase RUE gross strength to 4+/5 as to improve functional strength and tolerance for work related tasks.    Baseline 5/6: RUE grossly 4-/5. Pain limited thorughout. Greatest impairment seen with R shoulder extension, abduction and ER. 7/19: grossly 4+/5    Time 12    Period Weeks    Status Achieved      PT LONG TERM GOAL #6   Title Pt will demonstrate improvement of at least  15 degrees with R shoulder flexion,  abuction, ER, and IR in order to improve ease with bathing.    Baseline 5/6: R shoulder flexion 140 deg.; R shoulder abduction 114 deg.; R shoulder ER 35 deg.; R shoulder IR 60 deg (abduciton, ER and IR are painful)/ 7/19: 150 deg shoulder flexion, 150 deg abduction, 50 deg ER, 40 deg. IR    Time 12    Period Days    Status Partially Met                   Plan - 06/16/21 0849     Clinical Impression Statement Pt goals retested as pt feels ready for d/c from PT reporting no pain in RUE in several weeks and states, "I feel normal again." Pt has met all but one therapy goal, and remaining goal has been partially met. Over reporting period pt has achieved gains in RUE ROM, strength and overall functional mobility with resolution of pain to 0/10. PT instructs pt to follow-up in the future should she have questions after d/c. Pt verbalizes understanding. The pt does not require further skilled PT at this time.    Personal Factors and Comorbidities Sex;Comorbidity 1;Comorbidity 2;Past/Current Experience;Time since onset of injury/illness/exacerbation    Comorbidities CVA with R side weakness, OA, migraine    Examination-Activity Limitations Bathing;Reach Overhead;Bed Mobility;Dressing;Hygiene/Grooming;Lift;Carry    Examination-Participation Restrictions Cleaning;Laundry;Community Activity;Meal Prep;Shop;Yard Work;Occupation    Stability/Clinical Decision Making Stable/Uncomplicated    Rehab Potential Good    PT Frequency 2x / week    PT Duration 12 weeks    PT Treatment/Interventions ADLs/Self Care Home Management;Biofeedback;Cryotherapy;Electrical Stimulation;Moist Heat;Traction;Ultrasound;Functional mobility training;Therapeutic activities;Therapeutic exercise;Neuromuscular re-education;Patient/family education;Manual techniques;Passive range of motion;Splinting;Taping;Joint Manipulations;Spinal Manipulations    PT Next Visit Plan mobilizations to  tolerance, strengthening, progress shoulder IR, continue progressing resistance used with shoulder strengthening within pain-tolerance, continue POC as previously indicated    PT Home Exercise Plan Initial HEP Access Code: [email or web address]; Advanced HEP 5/27:  Access Code: [email or web address]; supine pec stretch, no changes on this date    Consulted and Agree with Plan of Care Patient             Patient will benefit from skilled therapeutic intervention in order to improve the following deficits and impairments:  Decreased activity tolerance, Decreased endurance, Decreased range of motion, Decreased strength, Hypomobility, Increased fascial restricitons, Impaired UE functional use, Improper body mechanics, Pain, Decreased mobility, Increased muscle spasms, Impaired flexibility  Visit Diagnosis: Muscle weakness (generalized)     Problem List Patient Active Problem List   Diagnosis Date Noted   History of migraine 02/19/2021   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   Slow transit constipation    Strain of right rotator cuff capsule    Uncomplicated asthma    Right hemiparesis (HCC)    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   History of migraine headaches 12/23/2020   Status post total hysterectomy and bilateral salpingo-oophorectomy 09/23/2020   Generalized osteoarthritis of hand 01/27/2017   Mood disorder ([place]) 07/13/2016   Foot pain, left 07/13/2016   Prediabetes 11/07/2015   Mixed hyperlipidemia 11/07/2015   Low serum thyroid stimulating hormone (TSH) 09/30/2015   Osteoarthritis 09/29/2015   Menopausal syndrome (hot flashes) 09/29/2015   Obesity, Class I, BMI 30-34.9 09/26/2015   Vitamin D deficiency 09/26/2015   Migraine headache without aura 09/26/2015   Allergic asthma, mild intermittent, uncomplicated 80/01/4916   Colon polyp 09/26/2015   Synovial cyst of left knee 09/26/2015   Environmental and seasonal allergies 09/26/2015   Ricard Dillon PT, DPT 06/16/2021, 8:56 AM  [place] MAIN [facility] SERVICES [address] [place], [place], [address] Phone: [phone]   Fax:  [phone]  Name: [name] MRN: [id number] Date of Birth: 06-09-1961

## 2021-06-29 ENCOUNTER — Other Ambulatory Visit: Payer: Self-pay | Admitting: Internal Medicine

## 2021-06-29 ENCOUNTER — Telehealth: Payer: Self-pay | Admitting: *Deleted

## 2021-06-29 DIAGNOSIS — Z1231 Encounter for screening mammogram for malignant neoplasm of breast: Secondary | ICD-10-CM

## 2021-06-29 NOTE — Telephone Encounter (Signed)
[name] called for a copy of her RTW letter to be emailed to her personal email. We do not send to personal email, so I have recreated letter to send through MyChart. Sent.

## 2021-07-01 ENCOUNTER — Other Ambulatory Visit: Payer: Self-pay | Admitting: Neurology

## 2021-07-01 DIAGNOSIS — Z8673 Personal history of transient ischemic attack (TIA), and cerebral infarction without residual deficits: Secondary | ICD-10-CM

## 2021-07-04 ENCOUNTER — Other Ambulatory Visit: Payer: Self-pay | Admitting: Internal Medicine

## 2021-07-04 DIAGNOSIS — F39 Unspecified mood [affective] disorder: Secondary | ICD-10-CM

## 2021-07-04 NOTE — Telephone Encounter (Signed)
last RF 06/11/21 #90 RF

## 2021-07-12 ENCOUNTER — Encounter: Payer: Self-pay | Admitting: Internal Medicine

## 2021-07-20 ENCOUNTER — Ambulatory Visit: Payer: BC Managed Care – PPO

## 2021-07-22 ENCOUNTER — Other Ambulatory Visit: Payer: Self-pay | Admitting: Internal Medicine

## 2021-07-22 DIAGNOSIS — G43909 Migraine, unspecified, not intractable, without status migrainosus: Secondary | ICD-10-CM

## 2021-07-22 NOTE — Telephone Encounter (Signed)
Requested medication (s) are due for refill today - no  Requested medication (s) are on the active medication list -no  Future visit scheduled -yes  Last refill: 04/27/21  Notes to clinic: Request RF: non delegated Rx- not current on medication list  Requested Prescriptions  Pending Prescriptions Disp Refills   topiramate (TOPAMAX) 25 MG tablet [Pharmacy Med Name: TOPIRAMATE 25 MG TABLET] 90 tablet 1    Sig: TAKE 1 TABLET BY MOUTH EVERY DAY     Not Delegated - Neurology: Anticonvulsants - topiramate & zonisamide Failed - 07/22/2021  4:08 PM      Failed - This refill cannot be delegated      Passed - Cr in normal range and within 360 days    Creatinine, Ser  Date Value Ref Range Status  01/05/2021 0.89 0.44 - 1.00 mg/dL Final          Passed - CO2 in normal range and within 360 days    CO2  Date Value Ref Range Status  01/05/2021 27 22 - 32 mmol/L Final          Passed - Valid encounter within last 12 months    Recent Outpatient Visits           6 months ago Left pontine stroke [facility])   [facility] Reubin Milan, MD   8 months ago Acute non-recurrent maxillary sinusitis   [facility] Reubin Milan, MD   10 months ago Annual physical exam   [facility] Reubin Milan, MD   11 months ago Muscle spasm of back   [facility] Reubin Milan, MD   1 year ago Moderate intermittent asthma without complication   [facility] Reubin Milan, MD       Future Appointments             In 2 months Reubin Milan, MD [facility], [facility]               Requested Prescriptions  Pending Prescriptions Disp Refills   topiramate (TOPAMAX) 25 MG tablet [Pharmacy Med Name: TOPIRAMATE 25 MG TABLET] 90 tablet 1    Sig: TAKE 1 TABLET BY MOUTH EVERY DAY     Not Delegated - Neurology: Anticonvulsants - topiramate & zonisamide Failed - 07/22/2021  4:08 PM      Failed - This refill cannot be delegated       Passed - Cr in normal range and within 360 days    Creatinine, Ser  Date Value Ref Range Status  01/05/2021 0.89 0.44 - 1.00 mg/dL Final          Passed - CO2 in normal range and within 360 days    CO2  Date Value Ref Range Status  01/05/2021 27 22 - 32 mmol/L Final          Passed - Valid encounter within last 12 months    Recent Outpatient Visits           6 months ago Left pontine stroke [facility])   [facility] Reubin Milan, MD   8 months ago Acute non-recurrent maxillary sinusitis   [facility] Reubin Milan, MD   10 months ago Annual physical exam   [facility] Reubin Milan, MD   11 months ago Muscle spasm of back   [facility] Reubin Milan, MD   1 year ago Moderate intermittent asthma without complication  [facility] Reubin Milan, MD       Future Appointments             In 2 months Judithann Graves Nyoka Cowden, MD [facility]

## 2021-07-23 ENCOUNTER — Other Ambulatory Visit: Payer: Self-pay

## 2021-07-29 ENCOUNTER — Other Ambulatory Visit: Payer: Self-pay | Admitting: Internal Medicine

## 2021-07-29 DIAGNOSIS — F39 Unspecified mood [affective] disorder: Secondary | ICD-10-CM

## 2021-08-05 ENCOUNTER — Ambulatory Visit
Admission: RE | Admit: 2021-08-05 | Discharge: 2021-08-05 | Disposition: A | Discharge status: Home / Self Care | Payer: BC Managed Care – PPO | Source: Ambulatory Visit | Attending: Neurology | Admitting: Neurology

## 2021-08-05 ENCOUNTER — Other Ambulatory Visit: Payer: Self-pay

## 2021-08-05 DIAGNOSIS — Z8673 Personal history of transient ischemic attack (TIA), and cerebral infarction without residual deficits: Secondary | ICD-10-CM | POA: Diagnosis not present

## 2021-08-17 ENCOUNTER — Other Ambulatory Visit: Payer: Self-pay

## 2021-08-17 ENCOUNTER — Ambulatory Visit
Admission: RE | Admit: 2021-08-17 | Discharge: 2021-08-17 | Disposition: A | Discharge status: Home / Self Care | Payer: BC Managed Care – PPO | Source: Ambulatory Visit | Attending: Internal Medicine | Admitting: Internal Medicine

## 2021-08-17 DIAGNOSIS — Z1231 Encounter for screening mammogram for malignant neoplasm of breast: Secondary | ICD-10-CM | POA: Insufficient documentation

## 2021-09-22 ENCOUNTER — Encounter: Payer: BC Managed Care – PPO | Admitting: Internal Medicine

## 2021-09-30 ENCOUNTER — Other Ambulatory Visit: Payer: Self-pay | Admitting: Internal Medicine

## 2021-09-30 DIAGNOSIS — F39 Unspecified mood [affective] disorder: Secondary | ICD-10-CM

## 2021-09-30 NOTE — Telephone Encounter (Signed)
Requested medications are due for refill today.  yes  Requested medications are on the active medications list.  yes  Last refill. 07/29/2021  Future visit scheduled.   no  Notes to clinic.  Pharmacy needs a Dx code.

## 2021-10-19 ENCOUNTER — Ambulatory Visit: Payer: Self-pay | Admitting: Internal Medicine

## 2021-12-25 ENCOUNTER — Encounter: Payer: Self-pay | Admitting: Internal Medicine

## 2021-12-26 ENCOUNTER — Other Ambulatory Visit: Payer: Self-pay | Admitting: Internal Medicine

## 2021-12-27 NOTE — Telephone Encounter (Signed)
dc'd 12/20/21 "stop taking at discharge" by Lewie Chamber MD  Requested Prescriptions  Refused Prescriptions Disp Refills   rizatriptan (MAXALT-MLT) 10 MG disintegrating tablet [Pharmacy Med Name: RIZATRIPTAN 10 MG ODT] 10 tablet 5    Sig: TAKE 1 TABLET BY MOUTH AT HEADACHE ONSET MAY REPEAT ONCE IN 2 HOURS IF NEEDED. NO MORE THAN 2 TABS IN 24 HOURS     Neurology:  Migraine Therapy - Triptan Failed - 12/26/2021  3:02 PM      Failed - Last BP in normal range    BP Readings from Last 1 Encounters:  02/19/21 (!) 145/75         Passed - Valid encounter within last 12 months    Recent Outpatient Visits          11 months ago Left pontine stroke [facility])   [facility] Reubin Milan, MD   1 year ago Acute non-recurrent maxillary sinusitis   [facility] Reubin Milan, MD   1 year ago Annual physical exam   [facility] Reubin Milan, MD   1 year ago Muscle spasm of back   [facility] Reubin Milan, MD   1 year ago Moderate intermittent asthma without complication   [facility] Reubin Milan, MD

## 2022-01-05 ENCOUNTER — Other Ambulatory Visit: Payer: Self-pay | Admitting: Internal Medicine

## 2022-01-05 DIAGNOSIS — F39 Unspecified mood [affective] disorder: Secondary | ICD-10-CM

## 2022-01-05 NOTE — Telephone Encounter (Signed)
Appointment 01/08/22 Requested Prescriptions  Pending Prescriptions Disp Refills   citalopram (CELEXA) 20 MG tablet [Pharmacy Med Name: Citalopram Hydrobromide 20 MG Oral Tablet] 90 tablet 0    Sig: Take 1 tablet by mouth once daily     Psychiatry:  Antidepressants - SSRI Failed - 01/05/2022  2:30 AM      Failed - Valid encounter within last 6 months    Recent Outpatient Visits          12 months ago Left pontine stroke [facility])   Rudy, Laura H, MD   1 year ago Acute non-recurrent maxillary sinusitis   [facility] Glean Hess, MD   1 year ago Annual physical exam   [facility] Glean Hess, MD   1 year ago Muscle spasm of back   [facility] Glean Hess, MD   1 year ago Moderate intermittent asthma without complication   [facility] Glean Hess, MD      Future Appointments            In 3 days Glean Hess, MD [facility], [facility]

## 2022-01-08 ENCOUNTER — Other Ambulatory Visit: Payer: Self-pay

## 2022-01-08 ENCOUNTER — Ambulatory Visit (INDEPENDENT_AMBULATORY_CARE_PROVIDER_SITE_OTHER): Payer: 59 | Admitting: Internal Medicine

## 2022-01-08 ENCOUNTER — Encounter: Payer: Self-pay | Admitting: Internal Medicine

## 2022-01-08 VITALS — BP 128/85 | HR 86 | Ht 64.0 in | Wt 183.0 lb

## 2022-01-08 DIAGNOSIS — J4520 Mild intermittent asthma, uncomplicated: Secondary | ICD-10-CM | POA: Diagnosis not present

## 2022-01-08 DIAGNOSIS — R7303 Prediabetes: Secondary | ICD-10-CM

## 2022-01-08 DIAGNOSIS — M67449 Ganglion, unspecified hand: Secondary | ICD-10-CM

## 2022-01-08 DIAGNOSIS — E782 Mixed hyperlipidemia: Secondary | ICD-10-CM

## 2022-01-08 DIAGNOSIS — Z0000 Encounter for general adult medical examination without abnormal findings: Secondary | ICD-10-CM | POA: Diagnosis not present

## 2022-01-08 DIAGNOSIS — J3089 Other allergic rhinitis: Secondary | ICD-10-CM

## 2022-01-08 DIAGNOSIS — Z1231 Encounter for screening mammogram for malignant neoplasm of breast: Secondary | ICD-10-CM | POA: Diagnosis not present

## 2022-01-08 DIAGNOSIS — E559 Vitamin D deficiency, unspecified: Secondary | ICD-10-CM | POA: Diagnosis not present

## 2022-01-08 DIAGNOSIS — G8191 Hemiplegia, unspecified affecting right dominant side: Secondary | ICD-10-CM

## 2022-01-08 DIAGNOSIS — F39 Unspecified mood [affective] disorder: Secondary | ICD-10-CM

## 2022-01-08 MED ORDER — MONTELUKAST SODIUM 10 MG PO TABS: ORAL_TABLET | ORAL | 3 refills | Status: DC

## 2022-01-08 NOTE — Progress Notes (Signed)
Date:  01/08/2022   Name:  [name]   DOB:  Jul 25, 1961   MRN:  [id number]   Chief Complaint: Annual Exam (Breast exam no pap) [name] is a 61 y.o. female who presents today for her Complete Annual Exam. She feels well. She reports exercising walking 4 times a week. She reports she is sleeping well. Breast complaints none.  Mammogram: 07/2021 DEXA: none Pap smear: discontinued Colonoscopy: 07/2016 repeat 10 yrs  Immunization History  Administered Date(s) Administered   Influenza, Quadrivalent, Recombinant, Inj, Pf 07/16/2020   Influenza,inj,Quad PF,6+ Mos 07/22/2017, 08/08/2019   Influenza-Unspecified 09/03/2015, 09/05/2016, 07/23/2020, 10/06/2021   Moderna Sars-Covid-2 Vaccination 11/02/2020   PFIZER(Purple Top)SARS-COV-2 Vaccination 02/01/2020, 02/27/2020   PPD Test 07/16/2020   Tdap 08/08/2019   Zoster, Live 03/14/2011    Asthma She complains of cough and wheezing. There is no shortness of breath. This is a recurrent problem. The problem occurs rarely. Associated symptoms include headaches (much improved). Pertinent negatives include no chest pain, fever or trouble swallowing. Her symptoms are alleviated by leukotriene antagonist. Her past medical history is significant for asthma.  Hyperlipidemia This is a chronic problem. The problem is controlled. Pertinent negatives include no chest pain or shortness of breath. Current antihyperlipidemic treatment includes statins. The current treatment provides significant improvement of lipids.  Migraine  This is a recurrent (followed by Neurology) problem. Associated symptoms include coughing and weakness (very slight right sided weaknss). Pertinent negatives include no abdominal pain, dizziness, fever, hearing loss, tinnitus or vomiting.  Depression        This is a chronic problem.  The problem occurs intermittently.  Associated symptoms include headaches (much improved).  Associated symptoms include no fatigue.  Past  treatments include SSRIs - Selective serotonin reuptake inhibitors (and buspar added by Neurology). 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  Followed by Neurology; on ASA and statin.  Lab Results  Component Value Date   NA 141 01/05/2021   K 4.0 01/05/2021   CO2 27 01/05/2021   GLUCOSE 100 (H) 01/05/2021   BUN 18 01/05/2021   CREATININE 0.89 01/05/2021   CALCIUM 8.9 01/05/2021   GFRNONAA >60 01/05/2021   Lab Results  Component Value Date   CHOL 168 12/24/2020   HDL 68 12/24/2020   LDLCALC 86 12/24/2020   TRIG 70 12/24/2020   CHOLHDL 2.5 12/24/2020   Lab Results  Component Value Date   TSH 1.330 09/17/2020   Lab Results  Component Value Date   HGBA1C 5.6 12/24/2020   Lab Results  Component Value Date   WBC 6.4 01/05/2021   HGB 13.6 01/05/2021   HCT 41.6 01/05/2021   MCV 92.7 01/05/2021   PLT 277 01/05/2021   Lab Results  Component Value Date   ALT 21 12/27/2020   AST 22 12/27/2020   ALKPHOS 50 12/27/2020   BILITOT 0.9 12/27/2020   Lab Results  Component Value Date   VD25OH 31.3 01/17/2017     Review of Systems  Constitutional:  Negative for chills, fatigue and fever.  HENT:  Negative for congestion, hearing loss, tinnitus, trouble swallowing and voice change.   Eyes:  Negative for visual disturbance.  Respiratory:  Positive for cough and wheezing. Negative for chest tightness and shortness of breath.   Cardiovascular:  Negative for chest pain, palpitations and leg swelling.  Gastrointestinal:  Negative for abdominal pain, constipation, diarrhea and vomiting.  Endocrine: Negative for polydipsia and polyuria.  Genitourinary:  Negative for dysuria, frequency, genital  sores, vaginal bleeding and vaginal discharge.  Musculoskeletal:  Negative for arthralgias, gait problem and joint swelling.  Skin:  Negative for color change and rash.  Neurological:  Positive for weakness (very slight right sided weaknss) and headaches (much improved).  Negative for dizziness, tremors and light-headedness.  Hematological:  Negative for adenopathy. Does not bruise/bleed easily.  Psychiatric/Behavioral:  Positive for depression. Negative for dysphoric mood and sleep disturbance. The patient is not nervous/anxious.    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   Strain of right rotator cuff capsule    Right hemiparesis (HCC)    Left pontine stroke ([place]) 12/26/2020   Status post total hysterectomy and bilateral salpingo-oophorectomy 09/23/2020   Generalized osteoarthritis of hand 01/27/2017   Mood disorder ([place]) 07/13/2016   Foot pain, left 07/13/2016   Prediabetes 11/07/2015   Mixed hyperlipidemia 11/07/2015   Low serum thyroid stimulating hormone (TSH) 09/30/2015   Osteoarthritis 09/29/2015   Obesity, Class I, BMI 30-34.9 09/26/2015   Vitamin D deficiency 09/26/2015   Migraine headache without aura 09/26/2015   Allergic asthma, mild intermittent, uncomplicated [id number]   Colon polyp 09/26/2015   Synovial cyst of left knee 09/26/2015   Environmental and seasonal allergies 09/26/2015    Allergies  Allergen Reactions   Clavulanic Acid Diarrhea   Oxycodone Itching   Tape Rash    Past Surgical History:  Procedure Laterality Date   ABDOMINAL HYSTERECTOMY     APPENDECTOMY     CESAREAN SECTION     x 1   COLONOSCOPY  2012   due in 2017 for suspicious lesion   COLONOSCOPY WITH PROPOFOL N/A 08/20/2016   Procedure: COLONOSCOPY WITH PROPOFOL;  Surgeon: Lucilla Lame, MD;  Location: [facility];  Service: Endoscopy;  Laterality: N/A;   PARTIAL HYSTERECTOMY     one ovary left   REPAIR ANKLE LIGAMENT     from fall   ROTATOR CUFF REPAIR Left    shoulder   TONSILLECTOMY      Social History   Tobacco Use   Smoking status: Never   Smokeless tobacco: Never  Vaping Use   Vaping Use: Never used  Substance Use Topics   Alcohol use: Yes    Alcohol/week: 0.0 standard drinks     Comment: 2 drinks - 2x/mo   Drug use: No     Medication list has been reviewed and updated.  Current Meds  Medication Sig   acetaminophen (TYLENOL) 650 MG CR tablet Take 650 mg by mouth 2 (two) times daily as needed for pain.   albuterol (PROVENTIL) (2.5 MG/3ML) 0.083% nebulizer solution Take 3 mLs (2.5 mg total) by nebulization every 6 (six) hours as needed for wheezing or shortness of breath.   aspirin EC 81 MG EC tablet Take 1 tablet (81 mg total) by mouth daily. Swallow whole.   atorvastatin (LIPITOR) 20 MG tablet Take 40 mg by mouth daily.   busPIRone (BUSPAR) 5 MG tablet Take by mouth.   cetirizine (ZYRTEC) 10 MG tablet Take by mouth.   citalopram (CELEXA) 20 MG tablet Take 1 tablet by mouth once daily   cyanocobalamin 500 MCG tablet Take 500 mcg by mouth daily.   loratadine (CLARITIN) 10 MG tablet Take by mouth.   melatonin 5 MG TABS Take 5-10 mg by mouth at bedtime as needed (sleep).   montelukast (SINGULAIR) 10 MG tablet TAKE 1 TABLET BY MOUTH EVERYDAY AT BEDTIME   Multiple Vitamins-Minerals (MULTIVITAMIN ADULTS 50+ PO) Take 1  capsule by mouth daily.   nortriptyline (PAMELOR) 10 MG capsule SMARTSIG:1 Capsule(s) By Mouth Every Evening   Pyridoxine HCl (VITAMIN B6 PO) Take by mouth.   rizatriptan (MAXALT) 10 MG tablet as needed.   Triamcinolone Acetonide (NASACORT ALLERGY 24HR NA) Place 1-2 sprays into both nostrils daily as needed (allergies).   Vitamin D, Cholecalciferol, 1000 UNITS CAPS Take 1,000 Units by mouth daily.   [DISCONTINUED] diclofenac Sodium (VOLTAREN) 1 % GEL Apply 2 g topically 4 (four) times daily. To right shoulder    PHQ 2/9 Scores 01/08/2022 01/14/2021 01/09/2021 11/17/2020  PHQ - 2 Score 0 1 0 0  PHQ- 9 Score 0 2 0 0    GAD 7 : Generalized Anxiety Score 01/08/2022 01/09/2021 11/17/2020 09/17/2020  Nervous, Anxious, on Edge 1 1 0 1  Control/stop worrying 1 1 0 1  Worry too much - different things 1 1 0 1  Trouble relaxing 1 0 0 1  Restless 0 0 0 1   Easily annoyed or irritable 1 1 0 1  Afraid - awful might happen 0 1 0 1  Total GAD 7 Score 5 5 0 7  Anxiety Difficulty - - Not difficult at all Not difficult at all    BP Readings from Last 3 Encounters:  01/08/22 128/85  02/19/21 (!) 145/75  01/14/21 130/78    Physical Exam Vitals and nursing note reviewed.  Constitutional:      General: She is not in acute distress.    Appearance: She is well-developed.  HENT:     Head: Normocephalic and atraumatic.     Right Ear: Tympanic membrane and ear canal normal.     Left Ear: Tympanic membrane and ear canal normal.     Nose:     Right Sinus: No maxillary sinus tenderness.     Left Sinus: No maxillary sinus tenderness.  Eyes:     General: No scleral icterus.       Right eye: No discharge.        Left eye: No discharge.     Conjunctiva/sclera: Conjunctivae normal.  Neck:     Thyroid: No thyromegaly.     Vascular: No carotid bruit.  Cardiovascular:     Rate and Rhythm: Normal rate and regular rhythm.     Pulses: Normal pulses.     Heart sounds: Normal heart sounds.  Pulmonary:     Effort: Pulmonary effort is normal. No respiratory distress.     Breath sounds: No wheezing.  Chest:  Breasts:    Right: No mass, nipple discharge, skin change or tenderness.     Left: No mass, nipple discharge, skin change or tenderness.  Abdominal:     General: Bowel sounds are normal.     Palpations: Abdomen is soft.     Tenderness: There is no abdominal tenderness.  Musculoskeletal:        General: Normal range of motion.     Cervical back: Normal range of motion. No erythema.     Right lower leg: No edema.     Left lower leg: No edema.  Lymphadenopathy:     Cervical: No cervical adenopathy.  Skin:    General: Skin is warm and dry.     Capillary Refill: Capillary refill takes less than 2 seconds.     Findings: No rash.  Neurological:     Mental Status: She is alert and oriented to person, place, and time. Mental status is at baseline.      Cranial Nerves: No  cranial nerve deficit.     Sensory: No sensory deficit.     Deep Tendon Reflexes: Reflexes are normal and symmetric.  Psychiatric:        Attention and Perception: Attention normal.        Mood and Affect: Mood normal.    Wt Readings from Last 3 Encounters:  01/08/22 183 lb (83 kg)  02/19/21 176 lb (79.8 kg)  01/14/21 174 lb 3.2 oz (79 kg)    BP 128/85 (BP Location: Right Arm, Cuff Size: Large)    Pulse 86    Ht 5\' 4"  (1.626 m)    Wt 183 lb (83 kg)    SpO2 97%    BMI 31.41 kg/m   Assessment and Plan: 1. Annual physical exam Normal exam except for weight. Continue healthy diet, regular exercise. Up to date on screenings and immunizations.  2. Encounter for screening mammogram for breast cancer Due in September.  3. Allergic asthma, mild intermittent, uncomplicated Continue albuterol MDI as needed. Resume singulair in the spring - CBC with Differential/Platelet  4. Vitamin D deficiency supplemented - VITAMIN D 25 Hydroxy (Vit-D Deficiency, Fractures)  5. Prediabetes Work on low carb diet - Comprehensive metabolic panel - Hemoglobin A1c  6. Mixed hyperlipidemia Tolerating statin medication without side effects at this time LDL is at goal of < 70 on current dose Continue same therapy without change at this time. - Lipid panel  7. Mood disorder ([place]) Improved with celexa plus Buspar. No side effects, no SI/HI - TSH  8. Right hemiparesis (HCC) Resolved except for mild LE sx which may be OA Some tenderness and cyst of left middle finger - recommend Ortho evaluation  9. Environmental and seasonal allergies - montelukast (SINGULAIR) 10 MG tablet; TAKE 1 TABLET BY MOUTH EVERYDAY AT BEDTIME  Dispense: 30 tablet; Refill: 3   Partially dictated using Editor, commissioning. Any errors are unintentional.  Halina Maidens, MD [facility]  01/08/2022

## 2022-01-22 ENCOUNTER — Other Ambulatory Visit: Payer: Self-pay | Admitting: Internal Medicine

## 2022-01-22 DIAGNOSIS — J3089 Other allergic rhinitis: Secondary | ICD-10-CM

## 2022-01-22 NOTE — Telephone Encounter (Signed)
Refilled 01/08/2022 #30 3 refills. Requested Prescriptions  Pending Prescriptions Disp Refills   montelukast (SINGULAIR) 10 MG tablet [Pharmacy Med Name: MONTELUKAST SOD 10 MG TABLET] 90 tablet 1    Sig: TAKE 1 TABLET BY MOUTH EVERYDAY AT BEDTIME     Pulmonology:  Leukotriene Inhibitors Passed - 01/22/2022  2:01 PM      Passed - Valid encounter within last 12 months    Recent Outpatient Visits          2 weeks ago Annual physical exam   [facility] Reubin Milan, MD   1 year ago Left pontine stroke [facility])   [facility] Reubin Milan, MD   1 year ago Acute non-recurrent maxillary sinusitis   [facility] Reubin Milan, MD   1 year ago Annual physical exam   [facility] Reubin Milan, MD   1 year ago Muscle spasm of back   [facility] Reubin Milan, MD      Future Appointments            In 11 months Judithann Graves Nyoka Cowden, MD [facility], [facility]

## 2022-01-23 LAB — TSH: TSH: 2.44 u[IU]/mL (ref 0.450–4.500)

## 2022-01-23 LAB — CBC WITH DIFFERENTIAL/PLATELET
Basophils Absolute: 0 10*3/uL (ref 0.0–0.2)
Basos: 0 %
EOS (ABSOLUTE): 0.2 10*3/uL (ref 0.0–0.4)
Eos: 3 %
Hematocrit: 43.2 % (ref 34.0–46.6)
Hemoglobin: 14 g/dL (ref 11.1–15.9)
Immature Grans (Abs): 0 10*3/uL (ref 0.0–0.1)
Immature Granulocytes: 0 %
Lymphocytes Absolute: 2.9 10*3/uL (ref 0.7–3.1)
Lymphs: 38 %
MCH: 29.2 pg (ref 26.6–33.0)
MCHC: 32.4 g/dL (ref 31.5–35.7)
MCV: 90 fL (ref 79–97)
Monocytes Absolute: 0.4 10*3/uL (ref 0.1–0.9)
Monocytes: 6 %
Neutrophils Absolute: 4.1 10*3/uL (ref 1.4–7.0)
Neutrophils: 53 %
Platelets: 311 10*3/uL (ref 150–450)
RBC: 4.79 x10E6/uL (ref 3.77–5.28)
RDW: 13.1 % (ref 11.7–15.4)
WBC: 7.7 10*3/uL (ref 3.4–10.8)

## 2022-01-23 LAB — COMPREHENSIVE METABOLIC PANEL
ALT: 18 IU/L (ref 0–32)
AST: 19 IU/L (ref 0–40)
Albumin/Globulin Ratio: 2.4 — ABNORMAL HIGH (ref 1.2–2.2)
Albumin: 4.5 g/dL (ref 3.8–4.8)
Alkaline Phosphatase: 81 IU/L (ref 44–121)
BUN/Creatinine Ratio: 16 (ref 12–28)
BUN: 15 mg/dL (ref 8–27)
Bilirubin Total: 0.4 mg/dL (ref 0.0–1.2)
CO2: 25 mmol/L (ref 20–29)
Calcium: 8.8 mg/dL (ref 8.7–10.3)
Chloride: 102 mmol/L (ref 96–106)
Creatinine, Ser: 0.96 mg/dL (ref 0.57–1.00)
Globulin, Total: 1.9 g/dL (ref 1.5–4.5)
Glucose: 99 mg/dL (ref 70–99)
Potassium: 4 mmol/L (ref 3.5–5.2)
Sodium: 141 mmol/L (ref 134–144)
Total Protein: 6.4 g/dL (ref 6.0–8.5)
eGFR: 67 mL/min/{1.73_m2} (ref >59)

## 2022-01-23 LAB — LIPID PANEL
Chol/HDL Ratio: 1.7 ratio (ref 0.0–4.4)
Cholesterol, Total: 125 mg/dL (ref 100–199)
HDL: 73 mg/dL (ref >39)
LDL Chol Calc (NIH): 39 mg/dL (ref 0–99)
Triglycerides: 62 mg/dL (ref 0–149)
VLDL Cholesterol Cal: 13 mg/dL (ref 5–40)

## 2022-01-23 LAB — HEMOGLOBIN A1C
Est. average glucose Bld gHb Est-mCnc: 126 mg/dL
Hgb A1c MFr Bld: 6 % — ABNORMAL HIGH (ref 4.8–5.6)

## 2022-01-23 LAB — VITAMIN D 25 HYDROXY (VIT D DEFICIENCY, FRACTURES): Vit D, 25-Hydroxy: 47.2 ng/mL (ref 30.0–100.0)

## 2022-03-04 ENCOUNTER — Other Ambulatory Visit: Payer: Self-pay | Admitting: Internal Medicine

## 2022-03-04 DIAGNOSIS — F39 Unspecified mood [affective] disorder: Secondary | ICD-10-CM

## 2022-03-05 NOTE — Telephone Encounter (Signed)
Call to patient to verify pharmacy. Rx sent to [facility]  01/05/22 #90- [facility]. Left message for patient to call office. ?

## 2022-03-05 NOTE — Telephone Encounter (Signed)
[place] where the original prescription was sent 01/05/22. Pt did not pick up that prescription. [facility]. ? ?Requested Prescriptions  ?Pending Prescriptions Disp Refills  ?? citalopram (CELEXA) 20 MG tablet [Pharmacy Med Name: CITALOPRAM HBR 20 MG TABLET] 90 tablet 0  ?  Sig: TAKE 1 AND 1/2 TABLETS BY MOUTH DAILY  ?  ? Psychiatry:  Antidepressants - SSRI Passed - 03/05/2022  9:10 AM  ?  ?  Passed - Valid encounter within last 6 months  ?  Recent Outpatient Visits   ?      ? 1 month ago Annual physical exam  ? [facility], MD  ? 1 year ago Left pontine stroke [facility])  ? [facility] Glean [facility], MD  ? 1 year ago Acute non-recurrent maxillary sinusitis  ? [facility] Glean Hess, MD  ? 1 year ago Annual physical exam  ? [facility] Glean Hess, MD  ? 1 year ago Muscle spasm of back  ? [facility] Glean Hess, MD  ?  ?  ?Future Appointments   ?        ? In 10 months Glean Hess, MD [facility], [place]  ?  ? ?  ?  ?  ? ? ?

## 2022-03-05 NOTE — Telephone Encounter (Signed)
Pt called in to inform what pharmacy she is using,  ?[id number] [address], [place] [address] Phone:  [phone]  ?Fax:  [phone]  ?  ? ?

## 2022-04-09 ENCOUNTER — Other Ambulatory Visit: Payer: Self-pay | Admitting: Internal Medicine

## 2022-04-09 DIAGNOSIS — J3089 Other allergic rhinitis: Secondary | ICD-10-CM

## 2022-04-09 NOTE — Telephone Encounter (Signed)
Requested Prescriptions  ?Pending Prescriptions Disp Refills  ?? montelukast (SINGULAIR) 10 MG tablet [Pharmacy Med Name: MONTELUKAST SOD 10 MG TABLET] 90 tablet 1  ?  Sig: TAKE 1 TABLET BY MOUTH EVERYDAY AT BEDTIME  ?  ? Pulmonology:  Leukotriene Inhibitors Passed - 04/09/2022  2:35 AM  ?  ?  Passed - Valid encounter within last 12 months  ?  Recent Outpatient Visits   ?      ? 3 months ago Annual physical exam  ? [facility] Reubin Milan, MD  ? 1 year ago Left pontine stroke [facility])  ? [facility] Reubin Milan, MD  ? 1 year ago Acute non-recurrent maxillary sinusitis  ? [facility] Reubin Milan, MD  ? 1 year ago Annual physical exam  ? [facility] Reubin Milan, MD  ? 1 year ago Muscle spasm of back  ? [facility] Reubin Milan, MD  ?  ?  ?Future Appointments   ?        ? In 9 months Reubin Milan, MD [facility], [facility]  ?  ? ?  ?  ?  ? ? ?

## 2022-04-19 ENCOUNTER — Encounter: Payer: Self-pay | Admitting: Internal Medicine

## 2022-04-23 ENCOUNTER — Ambulatory Visit: Payer: 59 | Admitting: Internal Medicine

## 2022-05-04 ENCOUNTER — Other Ambulatory Visit: Payer: Self-pay | Admitting: Internal Medicine

## 2022-05-04 DIAGNOSIS — F39 Unspecified mood [affective] disorder: Secondary | ICD-10-CM

## 2022-05-04 NOTE — Telephone Encounter (Signed)
Requested Prescriptions  Pending Prescriptions Disp Refills  . citalopram (CELEXA) 20 MG tablet [Pharmacy Med Name: CITALOPRAM HBR 20 MG TABLET] 135 tablet 1    Sig: TAKE 1 AND 1/2 TABLETS DAILY BY MOUTH     Psychiatry:  Antidepressants - SSRI Passed - 05/04/2022  2:31 PM      Passed - Valid encounter within last 6 months    Recent Outpatient Visits          3 months ago Annual physical exam   [facility] Reubin Milan, MD   1 year ago Left pontine stroke [facility])   [facility] Reubin Milan, MD   1 year ago Acute non-recurrent maxillary sinusitis   [facility] Reubin Milan, MD   1 year ago Annual physical exam   [facility] Reubin Milan, MD   1 year ago Muscle spasm of back   [facility] Reubin Milan, MD      Future Appointments            In 8 months Judithann Graves Nyoka Cowden, MD [facility], [facility]

## 2022-05-15 IMAGING — MR MR HEAD W/O CM
12 series · 48 of 48 positions shown · non-contrast
Comparison: CT head 12/19/2020

CLINICAL DATA: TIA.  Right-sided weakness.  Speech abnormality.

EXAM:
MRI HEAD WITHOUT CONTRAST
TECHNIQUE: Multiplanar, multiecho pulse sequences of the brain and surrounding
structures were obtained without intravenous contrast.

[Series 5: ax dwi_tracew · axial · 3.0mm · 0.71mm/px · z∈[-88,+64]mm · 3 of 52 slices shown]
[im 1/52]
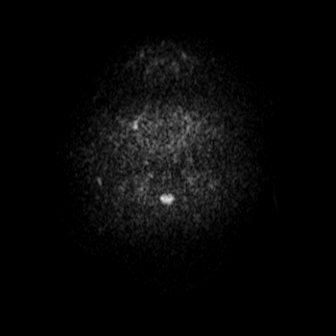
[im 26/52]
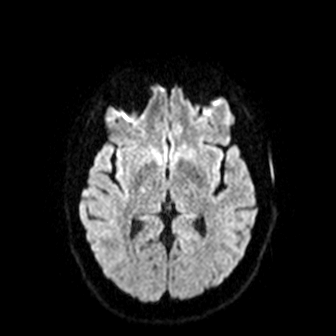
[im 52/52]
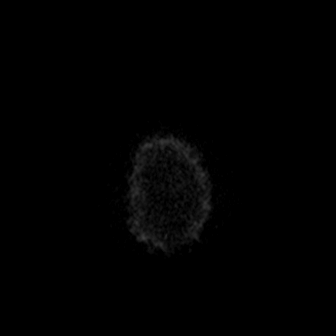

[Series 6: ax dwi_adc · axial · 3.0mm · 0.71mm/px · z∈[-88,+64]mm · 3 of 52 slices shown]
[im 1/52]
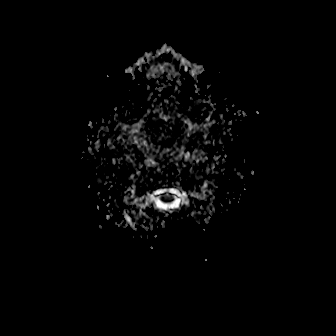
[im 26/52]
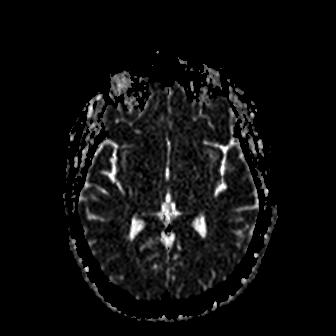
[im 52/52]
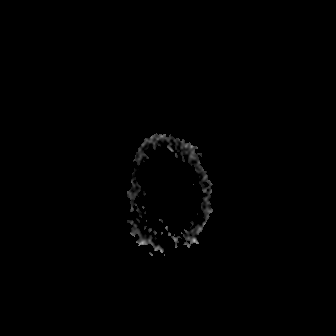

[Series 7: cor dwi_tracew · coronal · 5.0mm · 0.68mm/px · 3 of 34 slices shown]
[im 1/34]
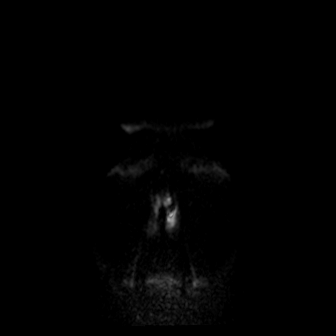
[im 17/34]
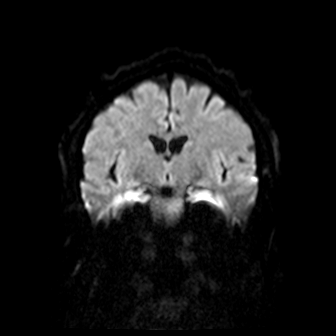
[im 34/34]
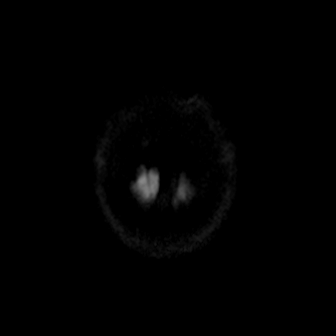

[Series 8: cor dwi_adc · coronal · 5.0mm · 0.68mm/px · 3 of 34 slices shown]
[im 1/34]
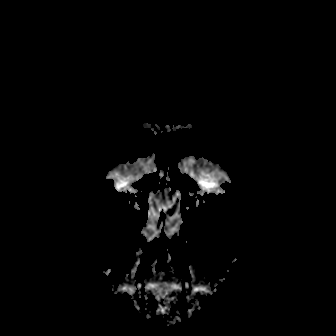
[im 17/34]
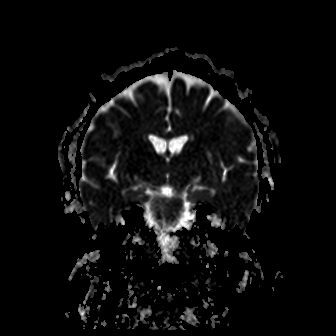
[im 34/34]
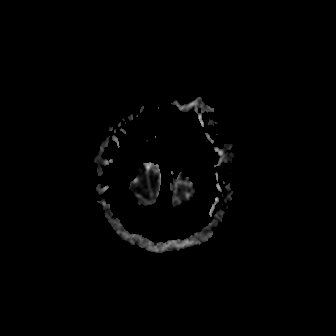

[Series 9: T1 · sagittal · 5.0mm · 0.62mm/px · 2 of 21 slices shown (1 of 2)]
[im 1/21]
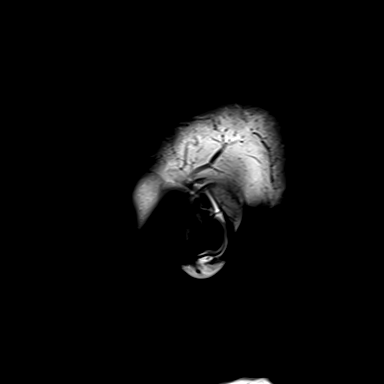
[im 21/21]
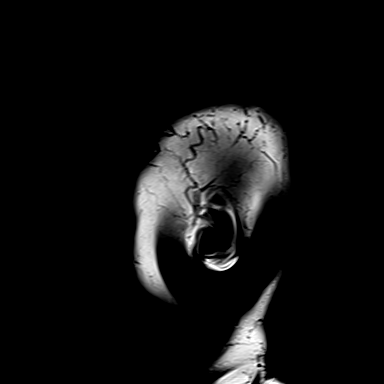

[Series 10: T2 · axial · 5.0mm · 0.53mm/px · z∈[-83,+60]mm · 2 of 25 slices shown (1 of 2)]
[im 1/25]
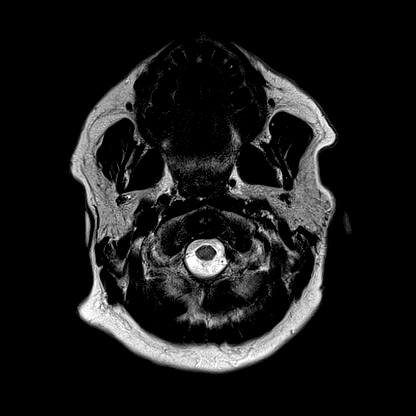
[im 25/25]
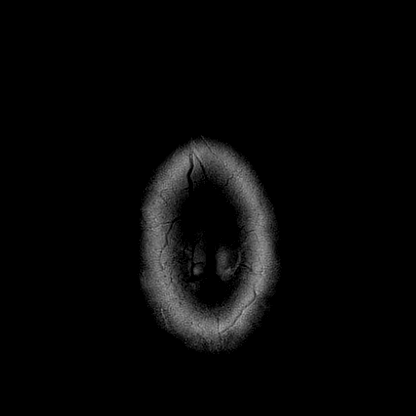

[Series 11: mag_images · axial · 3.0mm · 0.90mm/px · z∈[-100,+76]mm · 5 of 60 slices shown]
[im 1/60]
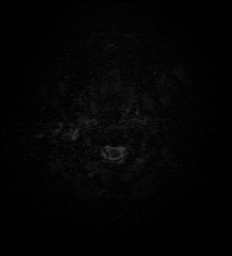
[im 15/60]
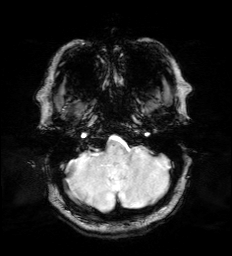
[im 30/60]
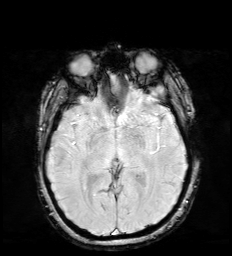
[im 45/60]
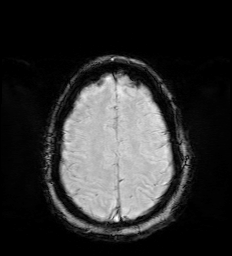
[im 60/60]
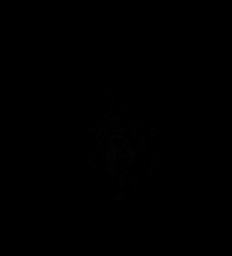

[Series 12: pha_images · axial · 3.0mm · 0.90mm/px · z∈[-100,+76]mm · 5 of 59 slices shown]
[im 1/59]
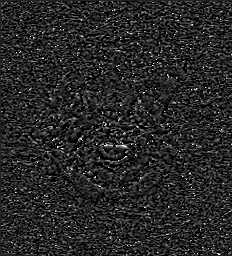
[im 15/59]
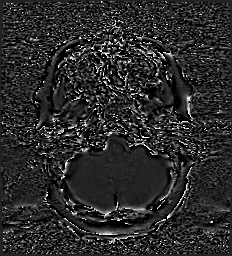
[im 30/59]
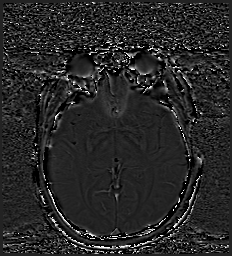
[im 44/59]
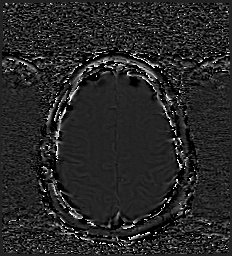
[im 59/59]
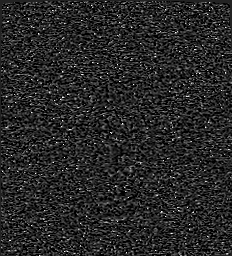

[Series 13: swi_images · axial · 3.0mm · 0.90mm/px · z∈[-100,+76]mm · 5 of 60 slices shown]
[im 1/60]
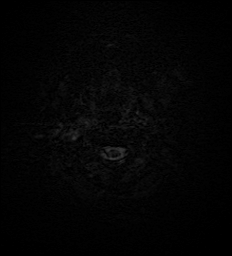
[im 15/60]
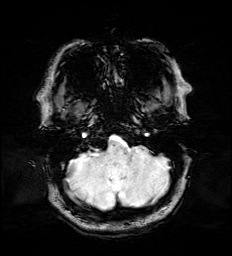
[im 30/60]
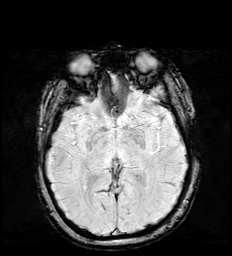
[im 45/60]
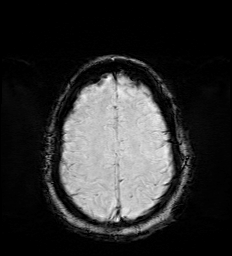
[im 60/60]
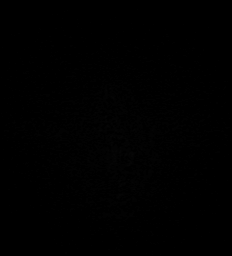

[Series 15: FLAIR · axial · 3.0mm · 0.53mm/px · z∈[-92,+70]mm · 4 of 55 slices shown]
[im 1/55]
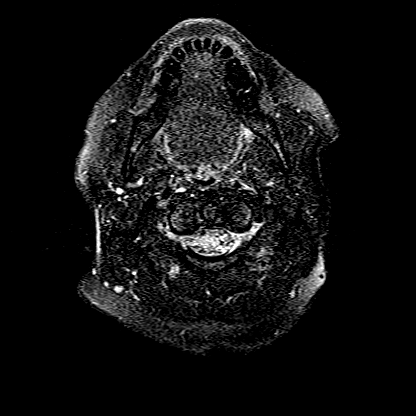
[im 19/55]
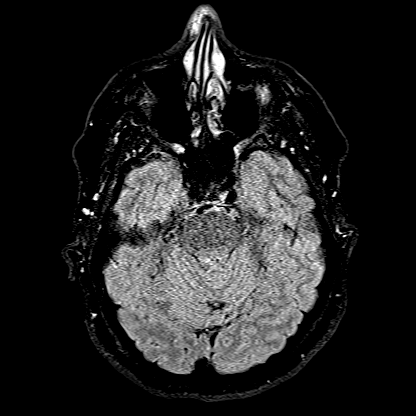
[im 37/55]
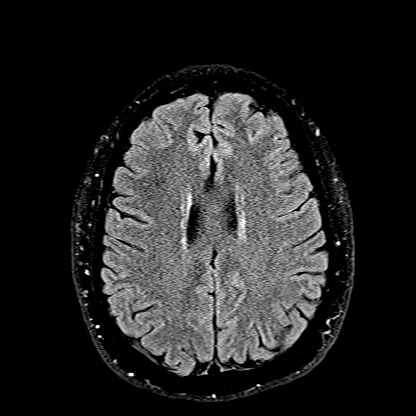
[im 55/55]
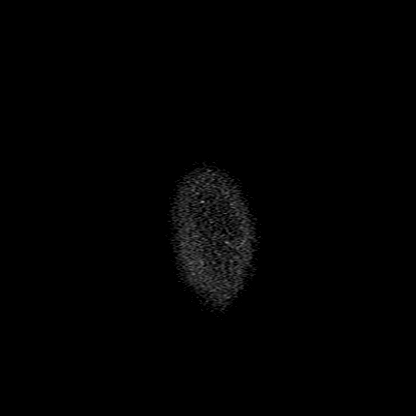

[Series 16: T1 · axial · 1.0mm · 0.98mm/px · z∈[-84,+59]mm · 11 of 144 slices shown (2 of 2)]
[im 1/144]
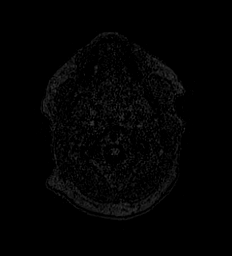
[im 15/144]
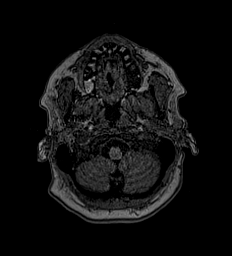
[im 29/144]
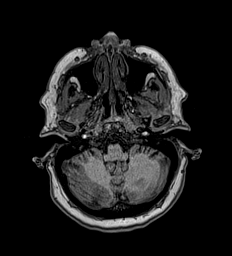
[im 43/144]
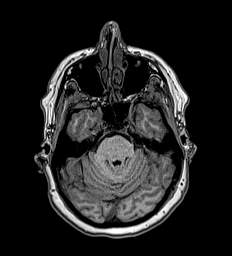
[im 58/144]
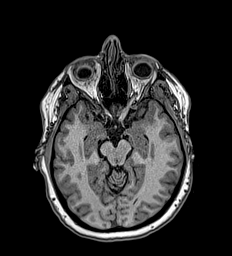
[im 72/144]
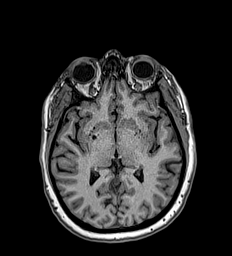
[im 86/144]
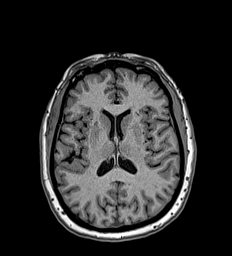
[im 101/144]
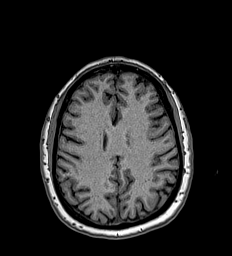
[im 115/144]
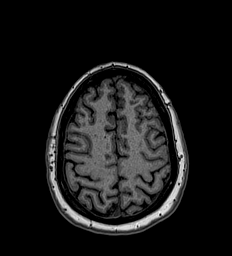
[im 129/144]
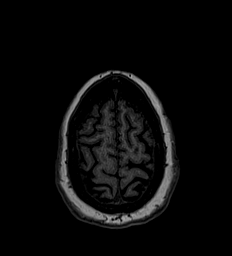
[im 144/144]
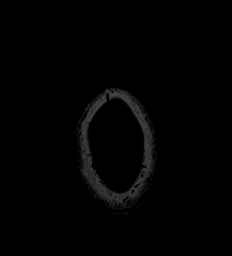

[Series 17: T2 · coronal · 5.0mm · 0.57mm/px · 2 of 29 slices shown (2 of 2)]
[im 1/29]
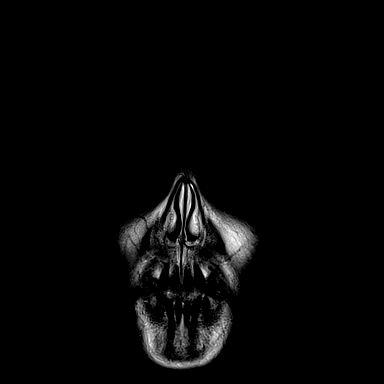
[im 29/29]
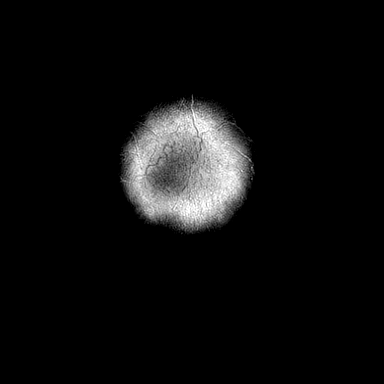

[48 of 48 positions shown; findings below may reference images not displayed]

FINDINGS: Brain: Small diffusion hyperintensity in the left lower pons. This
appears restricted and compatible with a small acute infarct. No
other acute infarct.

Ventricle size and cerebral volume normal. Small chronic infarct
left cerebellum. Normal cerebral white matter. Negative for
hemorrhage or mass.

Vascular: Normal arterial flow voids

Skull and upper cervical spine: Negative

Sinuses/Orbits: Negative

Other: None
IMPRESSION: Small acute infarct left lower pons. Small chronic infarct left
cerebellum.

## 2022-07-21 ENCOUNTER — Other Ambulatory Visit: Payer: Self-pay | Admitting: Internal Medicine

## 2022-07-21 DIAGNOSIS — Z1231 Encounter for screening mammogram for malignant neoplasm of breast: Secondary | ICD-10-CM

## 2022-09-27 ENCOUNTER — Ambulatory Visit
Admission: RE | Admit: 2022-09-27 | Discharge: 2022-09-27 | Disposition: A | Discharge status: Home / Self Care | Payer: 59 | Source: Ambulatory Visit | Attending: Internal Medicine | Admitting: Internal Medicine

## 2022-09-27 DIAGNOSIS — Z1231 Encounter for screening mammogram for malignant neoplasm of breast: Secondary | ICD-10-CM | POA: Insufficient documentation

## 2022-09-29 ENCOUNTER — Other Ambulatory Visit: Payer: Self-pay | Admitting: Internal Medicine

## 2022-09-29 DIAGNOSIS — R928 Other abnormal and inconclusive findings on diagnostic imaging of breast: Secondary | ICD-10-CM

## 2022-09-29 DIAGNOSIS — N63 Unspecified lump in unspecified breast: Secondary | ICD-10-CM

## 2022-09-30 ENCOUNTER — Ambulatory Visit
Admission: RE | Admit: 2022-09-30 | Discharge: 2022-09-30 | Disposition: A | Discharge status: Home / Self Care | Payer: 59 | Source: Ambulatory Visit | Attending: Internal Medicine | Admitting: Internal Medicine

## 2022-09-30 DIAGNOSIS — N63 Unspecified lump in unspecified breast: Secondary | ICD-10-CM

## 2022-09-30 DIAGNOSIS — R928 Other abnormal and inconclusive findings on diagnostic imaging of breast: Secondary | ICD-10-CM | POA: Diagnosis present

## 2022-10-31 ENCOUNTER — Other Ambulatory Visit: Payer: Self-pay | Admitting: Internal Medicine

## 2022-10-31 DIAGNOSIS — J3089 Other allergic rhinitis: Secondary | ICD-10-CM

## 2022-11-18 ENCOUNTER — Encounter: Payer: Self-pay | Admitting: Internal Medicine

## 2023-01-10 ENCOUNTER — Other Ambulatory Visit: Payer: Self-pay

## 2023-01-10 DIAGNOSIS — F39 Unspecified mood [affective] disorder: Secondary | ICD-10-CM

## 2023-01-10 MED ORDER — CITALOPRAM HYDROBROMIDE 20 MG PO TABS: ORAL_TABLET | ORAL | 0 refills | Status: DC

## 2023-01-11 ENCOUNTER — Encounter: Payer: 59 | Admitting: Internal Medicine

## 2023-02-19 ENCOUNTER — Other Ambulatory Visit: Payer: Self-pay | Admitting: Internal Medicine

## 2023-02-20 MED ORDER — ATORVASTATIN CALCIUM 20 MG PO TABS
40.0000 mg | ORAL_TABLET | Freq: Every day | ORAL | 0 refills | Status: DC
  Filled 2023-02-20: qty 90, 45d supply, fill #0

## 2023-02-21 ENCOUNTER — Other Ambulatory Visit (HOSPITAL_COMMUNITY): Payer: Self-pay

## 2023-02-22 ENCOUNTER — Encounter: Payer: Self-pay | Admitting: Internal Medicine

## 2023-02-22 ENCOUNTER — Other Ambulatory Visit: Payer: Self-pay

## 2023-02-22 DIAGNOSIS — J3089 Other allergic rhinitis: Secondary | ICD-10-CM

## 2023-02-22 MED ORDER — MONTELUKAST SODIUM 10 MG PO TABS
ORAL_TABLET | ORAL | 0 refills | Status: DC
Start: 2023-02-22 — End: 2023-08-15

## 2023-02-22 MED ORDER — ATORVASTATIN CALCIUM 20 MG PO TABS: 40.0000 mg | ORAL_TABLET | Freq: Every day | ORAL | 0 refills | Status: DC

## 2023-02-22 MED ORDER — BUSPIRONE HCL 5 MG PO TABS: 5.0000 mg | ORAL_TABLET | Freq: Two times a day (BID) | ORAL | 0 refills | Status: DC

## 2023-03-17 ENCOUNTER — Other Ambulatory Visit: Payer: Self-pay | Admitting: Internal Medicine

## 2023-03-18 NOTE — Telephone Encounter (Signed)
Unable to refill per protocol, Rx request is too soon. Last refill 02/22/23 for 45 days.  Requested Prescriptions  Pending Prescriptions Disp Refills   atorvastatin (LIPITOR) 20 MG tablet [Pharmacy Med Name: ATORVASTATIN 20 MG TABLET] 60 tablet 0    Sig: Take 2 tablets (40 mg total) by mouth daily.     Cardiovascular:  Antilipid - Statins Failed - 03/17/2023  8:36 PM      Failed - Valid encounter within last 12 months    Recent Outpatient Visits           1 year ago Annual physical exam   [facility], Nyoka Cowden, MD   2 years ago Left pontine stroke [facility])   [place] [facility], Nyoka Cowden, MD   2 years ago Acute non-recurrent maxillary sinusitis   [place] [facility], Nyoka Cowden, MD   2 years ago Annual physical exam   [facility], Nyoka Cowden, MD   2 years ago Muscle spasm of back   [facility], Nyoka Cowden, MD       Future Appointments             In 3 months Reubin Milan, MD [facility], [facility]            Failed - Lipid Panel in normal range within the last 12 months    Cholesterol, Total  Date Value Ref Range Status  01/22/2022 125 100 - 199 mg/dL Final   LDL Chol Calc (NIH)  Date Value Ref Range Status  01/22/2022 39 0 - 99 mg/dL Final   HDL  Date Value Ref Range Status  01/22/2022 73 >39 mg/dL Final   Triglycerides  Date Value Ref Range Status  01/22/2022 62 0 - 149 mg/dL Final         Passed - Patient is not pregnant

## 2023-03-21 ENCOUNTER — Other Ambulatory Visit: Payer: Self-pay | Admitting: Internal Medicine

## 2023-03-24 ENCOUNTER — Encounter: Payer: Self-pay | Admitting: Internal Medicine

## 2023-03-24 NOTE — Telephone Encounter (Signed)
Pt response.  KP

## 2023-03-25 ENCOUNTER — Other Ambulatory Visit: Payer: Self-pay | Admitting: Internal Medicine

## 2023-03-25 MED ORDER — ATORVASTATIN CALCIUM 20 MG PO TABS: 40.0000 mg | ORAL_TABLET | Freq: Every day | ORAL | 0 refills | Status: DC

## 2023-05-08 ENCOUNTER — Encounter: Payer: Self-pay | Admitting: Internal Medicine

## 2023-05-10 ENCOUNTER — Ambulatory Visit (INDEPENDENT_AMBULATORY_CARE_PROVIDER_SITE_OTHER): Payer: PRIVATE HEALTH INSURANCE | Admitting: Internal Medicine

## 2023-05-10 ENCOUNTER — Encounter: Payer: Self-pay | Admitting: Internal Medicine

## 2023-05-10 VITALS — BP 130/78 | HR 86 | Ht 64.0 in | Wt 181.0 lb

## 2023-05-10 DIAGNOSIS — B354 Tinea corporis: Secondary | ICD-10-CM

## 2023-05-10 DIAGNOSIS — F39 Unspecified mood [affective] disorder: Secondary | ICD-10-CM

## 2023-05-10 DIAGNOSIS — E782 Mixed hyperlipidemia: Secondary | ICD-10-CM | POA: Diagnosis not present

## 2023-05-10 MED ORDER — BUSPIRONE HCL 5 MG PO TABS: 5.0000 mg | ORAL_TABLET | Freq: Two times a day (BID) | ORAL | 1 refills | Status: DC

## 2023-05-10 MED ORDER — NYSTATIN-TRIAMCINOLONE 100000-0.1 UNIT/GM-% EX OINT: 1.0000 | TOPICAL_OINTMENT | Freq: Two times a day (BID) | CUTANEOUS | 1 refills | Status: DC

## 2023-05-10 MED ORDER — ATORVASTATIN CALCIUM 20 MG PO TABS: 40.0000 mg | ORAL_TABLET | Freq: Every day | ORAL | 1 refills | Status: DC

## 2023-05-10 MED ORDER — NORTRIPTYLINE HCL 10 MG PO CAPS: ORAL_CAPSULE | ORAL | 1 refills | Status: AC

## 2023-05-10 MED ORDER — CITALOPRAM HYDROBROMIDE 20 MG PO TABS: ORAL_TABLET | ORAL | 1 refills | Status: DC

## 2023-05-10 NOTE — Progress Notes (Signed)
Date:  05/10/2023   Name:  [name]   DOB:  1961/04/14   MRN:  [id number]   Chief Complaint: Rash (Itchy Rash Under Both Breasts. Started 5 days ago. Not painful. )  Rash This is a new problem. The current episode started in the past 7 days. The problem has been gradually worsening since onset. The affected locations include the torso. The rash is characterized by redness and itchiness. Pertinent negatives include no fever or shortness of breath.    Lab Results  Component Value Date   NA 141 01/22/2022   K 4.0 01/22/2022   CO2 25 01/22/2022   GLUCOSE 99 01/22/2022   BUN 15 01/22/2022   CREATININE 0.96 01/22/2022   CALCIUM 8.8 01/22/2022   EGFR 67 01/22/2022   GFRNONAA >60 01/05/2021   Lab Results  Component Value Date   CHOL 125 01/22/2022   HDL 73 01/22/2022   LDLCALC 39 01/22/2022   TRIG 62 01/22/2022   CHOLHDL 1.7 01/22/2022   Lab Results  Component Value Date   TSH 2.440 01/22/2022   Lab Results  Component Value Date   HGBA1C 6.0 (H) 01/22/2022   Lab Results  Component Value Date   WBC 7.7 01/22/2022   HGB 14.0 01/22/2022   HCT 43.2 01/22/2022   MCV 90 01/22/2022   PLT 311 01/22/2022   Lab Results  Component Value Date   ALT 18 01/22/2022   AST 19 01/22/2022   ALKPHOS 81 01/22/2022   BILITOT 0.4 01/22/2022   Lab Results  Component Value Date   VD25OH 47.2 01/22/2022     Review of Systems  Constitutional:  Negative for chills and fever.  Respiratory:  Negative for chest tightness and shortness of breath.   Cardiovascular:  Negative for chest pain.  Skin:  Positive for rash.  Neurological:  Negative for dizziness and headaches.    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   Strain of right rotator cuff capsule    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   Status post total hysterectomy and bilateral salpingo-oophorectomy 09/23/2020   Generalized  osteoarthritis of hand 01/27/2017   Mood disorder (HCC) 07/13/2016   Foot pain, left 07/13/2016   Prediabetes 11/07/2015   Mixed hyperlipidemia 11/07/2015   Low serum thyroid stimulating hormone (TSH) 09/30/2015   Osteoarthritis 09/29/2015   Obesity, Class I, BMI 30-34.9 09/26/2015   Vitamin D deficiency 09/26/2015   Migraine headache without aura 09/26/2015   Allergic asthma, mild intermittent, uncomplicated 09/26/2015   Colon polyp 09/26/2015   Synovial cyst of left knee 09/26/2015   Environmental and seasonal allergies 09/26/2015    Allergies  Allergen Reactions   Clavulanic Acid Diarrhea   Oxycodone Itching   Tape Rash    Past Surgical History:  Procedure Laterality Date   APPENDECTOMY     CESAREAN SECTION     x 1   COLONOSCOPY  2012   due in 2017 for suspicious lesion   COLONOSCOPY WITH PROPOFOL N/A 08/20/2016   Procedure: COLONOSCOPY WITH PROPOFOL;  Surgeon: Midge Minium, MD;  Location: [facility];  Service: Endoscopy;  Laterality: N/A;   REPAIR ANKLE LIGAMENT     from fall   ROTATOR CUFF REPAIR Left    shoulder   TONSILLECTOMY     TOTAL ABDOMINAL HYSTERECTOMY      Social History   Tobacco Use   Smoking status: Never   Smokeless  tobacco: Never  Vaping Use   Vaping Use: Never used  Substance Use Topics   Alcohol use: Yes    Alcohol/week: 0.0 standard drinks of alcohol    Comment: 2 drinks - 2x/mo   Drug use: No     Medication list has been reviewed and updated.  Current Meds  Medication Sig   acetaminophen (TYLENOL) 650 MG CR tablet Take 650 mg by mouth 2 (two) times daily as needed for pain.   albuterol (PROVENTIL) (2.5 MG/3ML) 0.083% nebulizer solution Take 3 mLs (2.5 mg total) by nebulization every 6 (six) hours as needed for wheezing or shortness of breath.   aspirin EC 81 MG EC tablet Take 1 tablet (81 mg total) by mouth daily. Swallow whole.   cetirizine (ZYRTEC) 10 MG tablet Take by mouth.   cyanocobalamin 500 MCG tablet Take 500 mcg  by mouth daily.   loratadine (CLARITIN) 10 MG tablet Take by mouth.   melatonin 5 MG TABS Take 5-10 mg by mouth at bedtime as needed (sleep).   montelukast (SINGULAIR) 10 MG tablet TAKE 1 TABLET BY MOUTH EVERYDAY AT BEDTIME   Multiple Vitamins-Minerals (MULTIVITAMIN ADULTS 50+ PO) Take 1 capsule by mouth daily.   nystatin-triamcinolone ointment (MYCOLOG) Apply 1 Application topically 2 (two) times daily.   Pyridoxine HCl (VITAMIN B6 PO) Take by mouth.   rizatriptan (MAXALT) 10 MG tablet as needed.   Triamcinolone Acetonide (NASACORT ALLERGY 24HR NA) Place 1-2 sprays into both nostrils daily as needed (allergies).   Vitamin D, Cholecalciferol, 1000 UNITS CAPS Take 1,000 Units by mouth daily.   [DISCONTINUED] atorvastatin (LIPITOR) 20 MG tablet Take 2 tablets (40 mg total) by mouth daily.   [DISCONTINUED] busPIRone (BUSPAR) 5 MG tablet Take 1 tablet (5 mg total) by mouth 2 (two) times daily.   [DISCONTINUED] citalopram (CELEXA) 20 MG tablet TAKE 1 AND 1/2 TABLETS DAILY BY MOUTH   [DISCONTINUED] nortriptyline (PAMELOR) 10 MG capsule SMARTSIG:1 Capsule(s) By Mouth Every Evening       05/10/2023    4:11 PM 01/08/2022   10:04 AM 01/09/2021   11:01 AM 11/17/2020   11:09 AM  GAD 7 : Generalized Anxiety Score  Nervous, Anxious, on Edge 1 1 1  0  Control/stop worrying 1 1 1  0  Worry too much - different things 2 1 1  0  Trouble relaxing 2 1 0 0  Restless 2 0 0 0  Easily annoyed or irritable 2 1 1  0  Afraid - awful might happen 0 0 1 0  Total GAD 7 Score 10 5 5  0  Anxiety Difficulty Somewhat difficult   Not difficult at all       05/10/2023    4:11 PM 01/08/2022   10:04 AM 01/14/2021    3:09 PM  Depression screen PHQ 2/9  Decreased Interest 1 0 0  Down, Depressed, Hopeless 1 0 1  PHQ - 2 Score 2 0 1  Altered sleeping 1 0 0  Tired, decreased energy 2 0 1  Change in appetite 2 0 0  Feeling bad or failure about yourself  2 0 0  Trouble concentrating 2 0 0  Moving slowly or fidgety/restless  2 0 0  Suicidal thoughts 0 0 0  PHQ-9 Score 13 0 2  Difficult doing work/chores Somewhat difficult Not difficult at all     BP Readings from Last 3 Encounters:  05/10/23 130/78  01/08/22 128/85  02/19/21 (!) 145/75    Physical Exam Vitals and nursing note reviewed.  Constitutional:  General: She is not in acute distress.    Appearance: Normal appearance. She is well-developed.  HENT:     Head: Normocephalic and atraumatic.  Cardiovascular:     Rate and Rhythm: Normal rate and regular rhythm.  Pulmonary:     Effort: Pulmonary effort is normal. No respiratory distress.     Breath sounds: No wheezing or rhonchi.  Skin:    General: Skin is warm and dry.     Findings: Erythema (under both breasts c/w tinea) and rash present.  Neurological:     Mental Status: She is alert and oriented to person, place, and time.  Psychiatric:        Mood and Affect: Mood normal.        Behavior: Behavior normal.     Wt Readings from Last 3 Encounters:  05/10/23 181 lb (82.1 kg)  01/08/22 183 lb (83 kg)  02/19/21 176 lb (79.8 kg)    BP 130/78   Pulse 86   Ht 5\' 4"  (1.626 m)   Wt 181 lb (82.1 kg)   SpO2 94%   BMI 31.07 kg/m   Assessment and Plan:  Problem List Items Addressed This Visit     Mood disorder (HCC)   Relevant Medications   citalopram (CELEXA) 20 MG tablet   busPIRone (BUSPAR) 5 MG tablet   nortriptyline (PAMELOR) 10 MG capsule   Mixed hyperlipidemia   Relevant Medications   atorvastatin (LIPITOR) 20 MG tablet   Other Visit Diagnoses     Tinea corporis    -  Primary   local care advised - continue treatment until resolved   Relevant Medications   nystatin-triamcinolone ointment (MYCOLOG)       No follow-ups on file.   Partially dictated using Dragon software, any errors are not intentional.  Reubin Milan, MD [facility] [place], [place]

## 2023-05-30 ENCOUNTER — Ambulatory Visit: Payer: PRIVATE HEALTH INSURANCE | Admitting: Internal Medicine

## 2023-06-15 ENCOUNTER — Other Ambulatory Visit: Payer: Self-pay | Admitting: Internal Medicine

## 2023-06-15 DIAGNOSIS — E782 Mixed hyperlipidemia: Secondary | ICD-10-CM

## 2023-06-24 ENCOUNTER — Ambulatory Visit (INDEPENDENT_AMBULATORY_CARE_PROVIDER_SITE_OTHER): Payer: PRIVATE HEALTH INSURANCE | Admitting: Internal Medicine

## 2023-06-24 ENCOUNTER — Encounter: Payer: Self-pay | Admitting: Internal Medicine

## 2023-06-24 VITALS — BP 126/74 | HR 95 | Ht 64.0 in | Wt 179.8 lb

## 2023-06-24 DIAGNOSIS — J4520 Mild intermittent asthma, uncomplicated: Secondary | ICD-10-CM | POA: Diagnosis not present

## 2023-06-24 DIAGNOSIS — Z23 Encounter for immunization: Secondary | ICD-10-CM

## 2023-06-24 DIAGNOSIS — Z0000 Encounter for general adult medical examination without abnormal findings: Secondary | ICD-10-CM | POA: Diagnosis not present

## 2023-06-24 DIAGNOSIS — G43009 Migraine without aura, not intractable, without status migrainosus: Secondary | ICD-10-CM | POA: Diagnosis not present

## 2023-06-24 DIAGNOSIS — R7303 Prediabetes: Secondary | ICD-10-CM

## 2023-06-24 DIAGNOSIS — G3184 Mild cognitive impairment, so stated: Secondary | ICD-10-CM

## 2023-06-24 DIAGNOSIS — Z1231 Encounter for screening mammogram for malignant neoplasm of breast: Secondary | ICD-10-CM | POA: Diagnosis not present

## 2023-06-24 DIAGNOSIS — F39 Unspecified mood [affective] disorder: Secondary | ICD-10-CM

## 2023-06-24 DIAGNOSIS — E782 Mixed hyperlipidemia: Secondary | ICD-10-CM

## 2023-06-24 NOTE — Assessment & Plan Note (Addendum)
No recent change in the character or frequency of headaches. Headaches respond well to current therapy with Maxalt prn and Nortriptyline at bedtime. Will continue current plan and follow up if worsening.

## 2023-06-24 NOTE — Assessment & Plan Note (Signed)
LDL is  Lab Results  Component Value Date   LDLCALC 39 01/22/2022   Currently being treated with atorvastatin with good compliance and no concerns.

## 2023-06-24 NOTE — Assessment & Plan Note (Signed)
Clinically stable on current regimen with good control of symptoms, No SI or HI. No change in management at this time. Continue Buspar and Celexa

## 2023-06-24 NOTE — Assessment & Plan Note (Signed)
Managed with diet and exercise Lab Results  Component Value Date   HGBA1C 6.0 (H) 01/22/2022

## 2023-06-24 NOTE — Progress Notes (Signed)
Date:  06/24/2023   Name:  [name]   DOB:  1961-01-01   MRN:  [id number]   Chief Complaint: Annual Exam [name] is a 62 y.o. female who presents today for her Complete Annual Exam. She feels well. She reports exercising - walking. She reports she is sleeping well. Breast complaints - none. She continues to work full time. She does not feel that her stroke has impaired her significantly.  Mammogram: 08/2022 DEXA: none Pap smear: discontinued Colonoscopy: 07/2016 repeat 10 yrs  Health Maintenance Due  Topic Date Due   COVID-19 Vaccine (4 - 2023-24 season) 07/30/2022    Immunization History  Administered Date(s) Administered   Influenza, Quadrivalent, Recombinant, Inj, Pf 07/16/2020   Influenza,inj,Quad PF,6+ Mos 07/22/2017, 08/08/2019   Influenza-Unspecified 09/03/2015, 09/05/2016, 07/23/2020, 10/06/2021   Moderna Sars-Covid-2 Vaccination 11/02/2020   PFIZER(Purple Top)SARS-COV-2 Vaccination 02/01/2020, 02/27/2020   PPD Test 07/16/2020   Tdap 08/08/2019   Zoster Recombinant(Shingrix) 06/24/2023   Zoster, Live 03/14/2011    Depression        This is a chronic problem.The problem is unchanged.  Associated symptoms include no fatigue and no headaches.  Past treatments include SSRIs - Selective serotonin reuptake inhibitors (celexa and buspar). Hyperlipidemia This is a chronic problem. The problem is controlled. Pertinent negatives include no chest pain or shortness of breath. Current antihyperlipidemic treatment includes statins.  Migraine  This is a recurrent problem. Episode frequency: about every 2 months. Pertinent negatives include no abdominal pain, coughing, dizziness, fever, hearing loss, tinnitus or vomiting. She has tried triptans for the symptoms.    Lab Results  Component Value Date   NA 141 01/22/2022   K 4.0 01/22/2022   CO2 25 01/22/2022   GLUCOSE 99 01/22/2022   BUN 15 01/22/2022   CREATININE 0.96 01/22/2022   CALCIUM 8.8 01/22/2022   EGFR 67  01/22/2022   GFRNONAA >60 01/05/2021   Lab Results  Component Value Date   CHOL 125 01/22/2022   HDL 73 01/22/2022   LDLCALC 39 01/22/2022   TRIG 62 01/22/2022   CHOLHDL 1.7 01/22/2022   Lab Results  Component Value Date   TSH 2.440 01/22/2022   Lab Results  Component Value Date   HGBA1C 6.0 (H) 01/22/2022   Lab Results  Component Value Date   WBC 7.7 01/22/2022   HGB 14.0 01/22/2022   HCT 43.2 01/22/2022   MCV 90 01/22/2022   PLT 311 01/22/2022   Lab Results  Component Value Date   ALT 18 01/22/2022   AST 19 01/22/2022   ALKPHOS 81 01/22/2022   BILITOT 0.4 01/22/2022   Lab Results  Component Value Date   VD25OH 47.2 01/22/2022     Review of Systems  Constitutional:  Negative for chills, fatigue and fever.  HENT:  Negative for congestion, hearing loss, tinnitus, trouble swallowing and voice change.   Eyes:  Negative for visual disturbance.  Respiratory:  Negative for cough, chest tightness, shortness of breath and wheezing.   Cardiovascular:  Negative for chest pain, palpitations and leg swelling.  Gastrointestinal:  Negative for abdominal pain, constipation, diarrhea and vomiting.  Endocrine: Negative for polydipsia and polyuria.  Genitourinary:  Negative for dysuria, frequency, genital sores, vaginal bleeding and vaginal discharge.  Musculoskeletal:  Negative for arthralgias, gait problem and joint swelling.  Skin:  Negative for color change and rash.  Neurological:  Negative for dizziness, tremors, light-headedness and headaches.  Hematological:  Negative for adenopathy. Does not bruise/bleed easily.  Psychiatric/Behavioral:  Positive for  depression. Negative for dysphoric mood and sleep disturbance. The patient is not nervous/anxious.     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   Strain of right rotator cuff capsule    Mild cognitive impairment 12/26/2020   Status post total hysterectomy and bilateral  salpingo-oophorectomy 09/23/2020   Generalized osteoarthritis of hand 01/27/2017   Mood disorder (HCC) 07/13/2016   Foot pain, left 07/13/2016   Prediabetes 11/07/2015   Mixed hyperlipidemia 11/07/2015   Low serum thyroid stimulating hormone (TSH) 09/30/2015   Osteoarthritis 09/29/2015   Obesity, Class I, BMI 30-34.9 09/26/2015   Vitamin D deficiency 09/26/2015   Migraine headache without aura 09/26/2015   Allergic asthma, mild intermittent, uncomplicated 09/26/2015   Colon polyp 09/26/2015   Synovial cyst of left knee 09/26/2015   Environmental and seasonal allergies 09/26/2015    Allergies  Allergen Reactions   Clavulanic Acid Diarrhea   Oxycodone Itching   Tape Rash    Past Surgical History:  Procedure Laterality Date   APPENDECTOMY     CESAREAN SECTION     x 1   COLONOSCOPY  2012   due in 2017 for suspicious lesion   COLONOSCOPY WITH PROPOFOL N/A 08/20/2016   Procedure: COLONOSCOPY WITH PROPOFOL;  Surgeon: Midge Minium, MD;  Location: [facility];  Service: Endoscopy;  Laterality: N/A;   REPAIR ANKLE LIGAMENT     from fall   ROTATOR CUFF REPAIR Left    shoulder   TONSILLECTOMY     TOTAL ABDOMINAL HYSTERECTOMY      Social History   Tobacco Use   Smoking status: Never   Smokeless tobacco: Never  Vaping Use   Vaping status: Never Used  Substance Use Topics   Alcohol use: Yes    Alcohol/week: 0.0 standard drinks of alcohol    Comment: 2 drinks - 2x/mo   Drug use: No     Medication list has been reviewed and updated.  Current Meds  Medication Sig   acetaminophen (TYLENOL) 650 MG CR tablet Take 650 mg by mouth 2 (two) times daily as needed for pain.   albuterol (PROVENTIL) (2.5 MG/3ML) 0.083% nebulizer solution Take 3 mLs (2.5 mg total) by nebulization every 6 (six) hours as needed for wheezing or shortness of breath.   aspirin EC 81 MG EC tablet Take 1 tablet (81 mg total) by mouth daily. Swallow whole.   atorvastatin (LIPITOR) 20 MG tablet Take 2  tablets (40 mg total) by mouth daily.   busPIRone (BUSPAR) 5 MG tablet Take 1 tablet (5 mg total) by mouth 2 (two) times daily. (Patient taking differently: Take 5 mg by mouth daily.)   cetirizine (ZYRTEC) 10 MG tablet Take by mouth.   citalopram (CELEXA) 20 MG tablet TAKE 1 AND 1/2 TABLETS DAILY BY MOUTH (Patient taking differently: Take 20 mg by mouth daily.)   cyanocobalamin 500 MCG tablet Take 500 mcg by mouth daily.   loratadine (CLARITIN) 10 MG tablet Take by mouth.   melatonin 5 MG TABS Take 5-10 mg by mouth at bedtime as needed (sleep).   montelukast (SINGULAIR) 10 MG tablet TAKE 1 TABLET BY MOUTH EVERYDAY AT BEDTIME   Multiple Vitamins-Minerals (MULTIVITAMIN ADULTS 50+ PO) Take 1 capsule by mouth daily.   nortriptyline (PAMELOR) 10 MG capsule SMARTSIG:1 Capsule(s) By Mouth Every Evening   nystatin-triamcinolone ointment (MYCOLOG) Apply 1 Application topically 2 (two) times daily.   Pyridoxine HCl (VITAMIN B6 PO) Take by mouth.   rizatriptan (MAXALT) 10  MG tablet as needed.   Triamcinolone Acetonide (NASACORT ALLERGY 24HR NA) Place 1-2 sprays into both nostrils daily as needed (allergies).   Vitamin D, Cholecalciferol, 1000 UNITS CAPS Take 1,000 Units by mouth daily.       06/24/2023    8:23 AM 05/10/2023    4:11 PM 01/08/2022   10:04 AM 01/09/2021   11:01 AM  GAD 7 : Generalized Anxiety Score  Nervous, Anxious, on Edge 1 1 1 1   Control/stop worrying 1 1 1 1   Worry too much - different things 1 2 1 1   Trouble relaxing 1 2 1  0  Restless 1 2 0 0  Easily annoyed or irritable 1 2 1 1   Afraid - awful might happen 0 0 0 1  Total GAD 7 Score 6 10 5 5   Anxiety Difficulty Somewhat difficult Somewhat difficult         06/24/2023    8:23 AM 05/10/2023    4:11 PM 01/08/2022   10:04 AM  Depression screen PHQ 2/9  Decreased Interest 0 1 0  Down, Depressed, Hopeless 1 1 0  PHQ - 2 Score 1 2 0  Altered sleeping 1 1 0  Tired, decreased energy 1 2 0  Change in appetite 1 2 0  Feeling  bad or failure about yourself  1 2 0  Trouble concentrating 1 2 0  Moving slowly or fidgety/restless 1 2 0  Suicidal thoughts 0 0 0  PHQ-9 Score 7 13 0  Difficult doing work/chores Somewhat difficult Somewhat difficult Not difficult at all    BP Readings from Last 3 Encounters:  06/24/23 126/74  05/10/23 130/78  01/08/22 128/85    Physical Exam Vitals and nursing note reviewed.  Constitutional:      General: She is not in acute distress.    Appearance: She is well-developed.  HENT:     Head: Normocephalic and atraumatic.     Right Ear: Tympanic membrane and ear canal normal.     Left Ear: Tympanic membrane and ear canal normal.     Nose:     Right Sinus: No maxillary sinus tenderness.     Left Sinus: No maxillary sinus tenderness.  Eyes:     General: No scleral icterus.       Right eye: No discharge.        Left eye: No discharge.     Conjunctiva/sclera: Conjunctivae normal.  Neck:     Thyroid: No thyromegaly.     Vascular: No carotid bruit.  Cardiovascular:     Rate and Rhythm: Normal rate and regular rhythm.     Pulses: Normal pulses.     Heart sounds: Normal heart sounds.  Pulmonary:     Effort: Pulmonary effort is normal. No respiratory distress.     Breath sounds: No wheezing.  Chest:  Breasts:    Right: No mass, nipple discharge, skin change or tenderness.     Left: No mass, nipple discharge, skin change or tenderness.  Abdominal:     General: Bowel sounds are normal.     Palpations: Abdomen is soft.     Tenderness: There is no abdominal tenderness.  Musculoskeletal:     Cervical back: Normal range of motion. No erythema.     Right lower leg: No edema.     Left lower leg: No edema.  Lymphadenopathy:     Cervical: No cervical adenopathy.  Skin:    General: Skin is warm and dry.     Findings: No rash.  Neurological:     General: No focal deficit present.     Mental Status: She is alert and oriented to person, place, and time.     Cranial Nerves: No  cranial nerve deficit.     Sensory: No sensory deficit.     Motor: Motor function is intact.     Coordination: Coordination is intact.     Deep Tendon Reflexes: Reflexes are normal and symmetric.  Psychiatric:        Attention and Perception: Attention normal.        Mood and Affect: Mood normal.     Wt Readings from Last 3 Encounters:  06/24/23 179 lb 12.8 oz (81.6 kg)  05/10/23 181 lb (82.1 kg)  01/08/22 183 lb (83 kg)    BP 126/74 (BP Location: Right Arm, Cuff Size: Large)   Pulse 95   Ht 5\' 4"  (1.626 m)   Wt 179 lb 12.8 oz (81.6 kg)   SpO2 97%   BMI 30.86 kg/m   Assessment and Plan:  Problem List Items Addressed This Visit     Prediabetes    Managed with diet and exercise Lab Results  Component Value Date   HGBA1C 6.0 (H) 01/22/2022         Relevant Orders   Comprehensive metabolic panel   Hemoglobin A1c   Mood disorder (HCC)    Clinically stable on current regimen with good control of symptoms, No SI or HI. No change in management at this time. Continue Buspar and Celexa      Relevant Orders   TSH   Mixed hyperlipidemia    LDL is  Lab Results  Component Value Date   LDLCALC 39 01/22/2022   Currently being treated with atorvastatin with good compliance and no concerns.       Relevant Orders   Lipid panel   Mild cognitive impairment    Followed by Neurology and felt to be almost fully recovered She continues to work full time and enjoys it. She decided not to apply for SSD.       Migraine headache without aura    No recent change in the character or frequency of headaches. Headaches respond well to current therapy with Maxalt prn and Nortriptyline at bedtime. Will continue current plan and follow up if worsening.       Allergic asthma, mild intermittent, uncomplicated    Asthma stable; uses albuterol PRN      Other Visit Diagnoses     Annual physical exam    -  Primary   Relevant Orders   CBC with Differential/Platelet    Comprehensive metabolic panel   Hemoglobin A1c   Lipid panel   TSH   Encounter for screening mammogram for breast cancer       Relevant Orders   MM 3D SCREENING MAMMOGRAM BILATERAL BREAST   Need for shingles vaccine       Relevant Orders   Varicella-zoster vaccine IM (Completed)       No follow-ups on file.    Reubin Milan, MD [facility] [place]

## 2023-06-24 NOTE — Patient Instructions (Signed)
Call [facility] to schedule your mammogram at [phone].  

## 2023-06-24 NOTE — Assessment & Plan Note (Signed)
Asthma stable; uses albuterol PRN

## 2023-06-24 NOTE — Assessment & Plan Note (Addendum)
Followed by Neurology and felt to be almost fully recovered She continues to work full time and enjoys it. She decided not to apply for SSD.

## 2023-06-27 ENCOUNTER — Encounter: Payer: Self-pay | Admitting: Internal Medicine

## 2023-06-28 ENCOUNTER — Other Ambulatory Visit: Payer: Self-pay | Admitting: Internal Medicine

## 2023-06-28 DIAGNOSIS — B354 Tinea corporis: Secondary | ICD-10-CM

## 2023-06-28 MED ORDER — FLUCONAZOLE 100 MG PO TABS
100.0000 mg | ORAL_TABLET | Freq: Every day | ORAL | 0 refills | Status: AC
Start: 2023-06-28 — End: 2023-07-01

## 2023-07-02 ENCOUNTER — Encounter: Payer: Self-pay | Admitting: Internal Medicine

## 2023-07-05 ENCOUNTER — Other Ambulatory Visit: Payer: Self-pay | Admitting: Internal Medicine

## 2023-07-05 DIAGNOSIS — E782 Mixed hyperlipidemia: Secondary | ICD-10-CM

## 2023-07-11 ENCOUNTER — Ambulatory Visit: Payer: PRIVATE HEALTH INSURANCE | Admitting: Physician Assistant

## 2023-07-11 ENCOUNTER — Ambulatory Visit
Admission: RE | Admit: 2023-07-11 | Discharge: 2023-07-11 | Disposition: A | Discharge status: Home / Self Care | Payer: PRIVATE HEALTH INSURANCE | Source: Ambulatory Visit | Attending: Physician Assistant | Admitting: Physician Assistant

## 2023-07-11 ENCOUNTER — Encounter: Payer: Self-pay | Admitting: Physician Assistant

## 2023-07-11 ENCOUNTER — Ambulatory Visit (INDEPENDENT_AMBULATORY_CARE_PROVIDER_SITE_OTHER): Payer: PRIVATE HEALTH INSURANCE | Admitting: Physician Assistant

## 2023-07-11 ENCOUNTER — Telehealth: Payer: PRIVATE HEALTH INSURANCE | Admitting: Family Medicine

## 2023-07-11 ENCOUNTER — Encounter: Payer: Self-pay | Admitting: Internal Medicine

## 2023-07-11 ENCOUNTER — Encounter: Payer: Self-pay | Admitting: Family Medicine

## 2023-07-11 ENCOUNTER — Ambulatory Visit: Payer: Self-pay | Admitting: *Deleted

## 2023-07-11 VITALS — BP 148/100 | HR 87 | Temp 97.8°F | Ht 64.0 in | Wt 176.0 lb

## 2023-07-11 DIAGNOSIS — M546 Pain in thoracic spine: Secondary | ICD-10-CM | POA: Diagnosis not present

## 2023-07-11 DIAGNOSIS — R1011 Right upper quadrant pain: Secondary | ICD-10-CM

## 2023-07-11 DIAGNOSIS — R109 Unspecified abdominal pain: Secondary | ICD-10-CM

## 2023-07-11 LAB — POCT URINALYSIS DIPSTICK
Bilirubin, UA: NEGATIVE
Glucose, UA: NEGATIVE
Leukocytes, UA: NEGATIVE
Nitrite, UA: NEGATIVE
Protein, UA: NEGATIVE
Spec Grav, UA: 1.015 (ref 1.010–1.025)
Urobilinogen, UA: 0.2 E.U./dL
pH, UA: 7 (ref 5.0–8.0)

## 2023-07-11 MED ORDER — BACLOFEN 10 MG PO TABS
10.0000 mg | ORAL_TABLET | Freq: Two times a day (BID) | ORAL | 0 refills | Status: DC
Start: 2023-07-11 — End: 2023-07-22

## 2023-07-11 MED ORDER — HYDROCODONE-ACETAMINOPHEN 5-325 MG PO TABS
1.0000 | ORAL_TABLET | Freq: Four times a day (QID) | ORAL | 0 refills | Status: AC | PRN
Start: 2023-07-11 — End: 2023-07-16

## 2023-07-11 MED ORDER — TAMSULOSIN HCL 0.4 MG PO CAPS
0.4000 mg | ORAL_CAPSULE | Freq: Every day | ORAL | 0 refills | Status: DC
Start: 2023-07-11 — End: 2023-11-11

## 2023-07-11 NOTE — Telephone Encounter (Signed)
Pease review.  KP

## 2023-07-11 NOTE — Telephone Encounter (Signed)
Noted   Pt has an appt today.  KP

## 2023-07-11 NOTE — Progress Notes (Signed)
Date:  07/11/2023   Name:  [name]   DOB:  1961-08-18   MRN:  [id number]   Chief Complaint: Back Pain  Back Pain This is a new problem. The current episode started yesterday. The problem occurs constantly. The problem is unchanged. The pain is present in the lumbar spine. Radiates to: lower back to right side. The pain is at a severity of 9/10. The pain is moderate. The pain is The same all the time. The symptoms are aggravated by bending, sitting, twisting, position, coughing, lying down and standing. Pertinent negatives include no abdominal pain, chest pain, dysuria or fever. She has tried NSAIDs, bed rest, ice and heat (tylenol, clelebrex, soma, baclofen) for the symptoms. The treatment provided mild relief.   [name] is a very pleasant 62 year old female new to me today, typically sees my colleague Dr. Bari Edward, MD, here for evaluation of acute onset lower back/flank pain since yesterday morning without triggering event. The pain is "rolling" and comes in waves of severe localized pain near the posteroinferior right ribs. She had a virtual visit this morning and was prescribed baclofen without relief. Lidocaine patches also did nothing. She cannot find relief. [name] had to drive her here today. No history of kidney stones. No blood in urine. No fever.    Medication list has been reviewed and updated.  Current Meds  Medication Sig   acetaminophen (TYLENOL) 650 MG CR tablet Take 650 mg by mouth 2 (two) times daily as needed for pain.   albuterol (PROVENTIL) (2.5 MG/3ML) 0.083% nebulizer solution Take 3 mLs (2.5 mg total) by nebulization every 6 (six) hours as needed for wheezing or shortness of breath.   aspirin EC 81 MG EC tablet Take 1 tablet (81 mg total) by mouth daily. Swallow whole.   atorvastatin (LIPITOR) 20 MG tablet Take 2 tablets (40 mg total) by mouth daily.   baclofen (LIORESAL) 10 MG tablet Take 1 tablet (10 mg total) by mouth 2 (two) times daily.    busPIRone (BUSPAR) 5 MG tablet Take 1 tablet (5 mg total) by mouth 2 (two) times daily. (Patient taking differently: Take 5 mg by mouth daily.)   cetirizine (ZYRTEC) 10 MG tablet Take by mouth.   citalopram (CELEXA) 20 MG tablet TAKE 1 AND 1/2 TABLETS DAILY BY MOUTH (Patient taking differently: Take 20 mg by mouth daily.)   cyanocobalamin 500 MCG tablet Take 500 mcg by mouth daily.   loratadine (CLARITIN) 10 MG tablet Take by mouth.   melatonin 5 MG TABS Take 5-10 mg by mouth at bedtime as needed (sleep).   montelukast (SINGULAIR) 10 MG tablet TAKE 1 TABLET BY MOUTH EVERYDAY AT BEDTIME   Multiple Vitamins-Minerals (MULTIVITAMIN ADULTS 50+ PO) Take 1 capsule by mouth daily.   nortriptyline (PAMELOR) 10 MG capsule SMARTSIG:1 Capsule(s) By Mouth Every Evening   Pyridoxine HCl (VITAMIN B6 PO) Take by mouth.   rizatriptan (MAXALT) 10 MG tablet as needed.   Triamcinolone Acetonide (NASACORT ALLERGY 24HR NA) Place 1-2 sprays into both nostrils daily as needed (allergies).   Vitamin D, Cholecalciferol, 1000 UNITS CAPS Take 1,000 Units by mouth daily.     Review of Systems  Constitutional:  Negative for fatigue and fever.  Respiratory:  Negative for chest tightness and shortness of breath.   Cardiovascular:  Negative for chest pain and palpitations.  Gastrointestinal:  Positive for nausea. Negative for abdominal pain, blood in stool, constipation and diarrhea.  Genitourinary:  Positive for flank pain. Negative for dysuria and  hematuria.  Musculoskeletal:  Positive for back pain.    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   Strain of right rotator cuff capsule    Mild cognitive impairment 12/26/2020   Status post total hysterectomy and bilateral salpingo-oophorectomy 09/23/2020   Generalized osteoarthritis of hand 01/27/2017   Mood disorder (HCC) 07/13/2016   Foot pain, left 07/13/2016   Prediabetes 11/07/2015   Mixed hyperlipidemia 11/07/2015    Low serum thyroid stimulating hormone (TSH) 09/30/2015   Osteoarthritis 09/29/2015   Obesity, Class I, BMI 30-34.9 09/26/2015   Vitamin D deficiency 09/26/2015   Migraine headache without aura 09/26/2015   Allergic asthma, mild intermittent, uncomplicated 09/26/2015   Colon polyp 09/26/2015   Synovial cyst of left knee 09/26/2015   Environmental and seasonal allergies 09/26/2015    Allergies  Allergen Reactions   Clavulanic Acid Diarrhea   Oxycodone Itching   Tape Rash    Immunization History  Administered Date(s) Administered   Influenza, Quadrivalent, Recombinant, Inj, Pf 07/16/2020   Influenza,inj,Quad PF,6+ Mos 07/22/2017, 08/08/2019   Influenza-Unspecified 09/03/2015, 09/05/2016, 07/23/2020, 10/06/2021   Moderna Sars-Covid-2 Vaccination 11/02/2020   PFIZER(Purple Top)SARS-COV-2 Vaccination 02/01/2020, 02/27/2020   PPD Test 07/16/2020   Tdap 08/08/2019   Zoster Recombinant(Shingrix) 06/24/2023   Zoster, Live 03/14/2011    Past Surgical History:  Procedure Laterality Date   APPENDECTOMY     CESAREAN SECTION     x 1   COLONOSCOPY  2012   due in 2017 for suspicious lesion   COLONOSCOPY WITH PROPOFOL N/A 08/20/2016   Procedure: COLONOSCOPY WITH PROPOFOL;  Surgeon: Midge Minium, MD;  Location: [facility];  Service: Endoscopy;  Laterality: N/A;   REPAIR ANKLE LIGAMENT     from fall   ROTATOR CUFF REPAIR Left    shoulder   TONSILLECTOMY     TOTAL ABDOMINAL HYSTERECTOMY      Social History   Tobacco Use   Smoking status: Never   Smokeless tobacco: Never  Vaping Use   Vaping status: Never Used  Substance Use Topics   Alcohol use: Yes    Alcohol/week: 0.0 standard drinks of alcohol    Comment: 2 drinks - 2x/mo   Drug use: No    Family History  Problem Relation Age of Onset   Diabetes Paternal Uncle    Breast cancer Paternal Grandmother 33   CAD Father         07/11/2023    3:34 PM 06/24/2023    8:23 AM 05/10/2023    4:11 PM 01/08/2022   10:04  AM  GAD 7 : Generalized Anxiety Score  Nervous, Anxious, on Edge 1 1 1 1   Control/stop worrying 1 1 1 1   Worry too much - different things 1 1 2 1   Trouble relaxing 1 1 2 1   Restless 1 1 2  0  Easily annoyed or irritable 1 1 2 1   Afraid - awful might happen 0 0 0 0  Total GAD 7 Score 6 6 10 5   Anxiety Difficulty Somewhat difficult Somewhat difficult Somewhat difficult        07/11/2023    3:33 PM 06/24/2023    8:23 AM 05/10/2023    4:11 PM  Depression screen PHQ 2/9  Decreased Interest 0 0 1  Down, Depressed, Hopeless 1 1 1   PHQ - 2 Score 1 1 2   Altered sleeping 1 1 1   Tired, decreased energy 1 1 2   Change in appetite 1 1 2  Feeling bad or failure about yourself  1 1 2   Trouble concentrating 1 1 2   Moving slowly or fidgety/restless 1 1 2   Suicidal thoughts 0 0 0  PHQ-9 Score 7 7 13   Difficult doing work/chores Somewhat difficult Somewhat difficult Somewhat difficult    BP Readings from Last 3 Encounters:  07/11/23 (!) 148/100  06/24/23 126/74  05/10/23 130/78    Wt Readings from Last 3 Encounters:  07/11/23 176 lb (79.8 kg)  06/24/23 179 lb 12.8 oz (81.6 kg)  05/10/23 181 lb (82.1 kg)    BP (!) 148/100   Pulse 87   Temp 97.8 F (36.6 C) (Oral)   Ht 5\' 4"  (1.626 m)   Wt 176 lb (79.8 kg)   SpO2 96%   BMI 30.21 kg/m   Physical Exam Vitals and nursing note reviewed.  Constitutional:      General: She is not in acute distress.    Appearance: She is ill-appearing.  Cardiovascular:     Rate and Rhythm: Normal rate and regular rhythm.     Heart sounds: No murmur heard.    No friction rub. No gallop.  Pulmonary:     Effort: Pulmonary effort is normal.     Breath sounds: Normal breath sounds.  Abdominal:     General: Abdomen is flat. Bowel sounds are normal. There is no distension.     Palpations: Abdomen is soft.     Tenderness: There is abdominal tenderness in the right upper quadrant. There is no right CVA tenderness, left CVA tenderness or rebound. Negative  signs include Murphy's sign and McBurney's sign.     Hernia: No hernia is present.     Comments: Patient gesturing to right CVA as the area of her pain, but negative bump test bilaterally.  She does have some tenderness upon attempt to "capture" the kidney with two-handed exam.  Otherwise normal abdomen  Musculoskeletal:        General: Normal range of motion.  Skin:    General: Skin is warm and dry.  Neurological:     Mental Status: She is alert and oriented to person, place, and time.     Gait: Gait is intact.  Psychiatric:        Mood and Affect: Mood and affect normal.      Recent Labs     Component Value Date/Time   NA 141 06/24/2023 0941   K 3.9 06/24/2023 0941   CL 101 06/24/2023 0941   CO2 29 06/24/2023 0941   GLUCOSE 73 06/24/2023 0941   GLUCOSE 100 (H) 01/05/2021 0626   BUN 20 06/24/2023 0941   CREATININE 0.93 06/24/2023 0941   CALCIUM 9.5 06/24/2023 0941   PROT 6.6 06/24/2023 0941   ALBUMIN 4.3 06/24/2023 0941   AST 20 06/24/2023 0941   ALT 19 06/24/2023 0941   ALKPHOS 95 06/24/2023 0941   BILITOT 0.3 06/24/2023 0941   GFRNONAA >60 01/05/2021 0626   GFRAA 73 09/17/2020 0915    Lab Results  Component Value Date   WBC 7.5 06/24/2023   HGB 13.4 06/24/2023   HCT 41.0 06/24/2023   MCV 87 06/24/2023   PLT 332 06/24/2023   Lab Results  Component Value Date   HGBA1C 6.1 (H) 06/24/2023   Lab Results  Component Value Date   CHOL 154 06/24/2023   HDL 75 06/24/2023   LDLCALC 67 06/24/2023   TRIG 58 06/24/2023   CHOLHDL 2.1 06/24/2023   Lab Results  Component Value Date  TSH 1.420 06/24/2023     Assessment and Plan:  1. Acute right flank pain Differential is broad, but suspicion for nephrolithiasis.  She does not have acute abdomen.  Pain quality and onset not characteristic of musculoskeletal injury.  Dipstick negative today aside from trace blood.  Will focus on pain control, tamsulosin, push fluids.  Ordering stat CT stone study. - POCT urinalysis  dipstick - HYDROcodone-acetaminophen (NORCO/VICODIN) 5-325 MG tablet; Take 1 tablet by mouth every 6 (six) hours as needed for up to 5 days for moderate pain.  Dispense: 20 tablet; Refill: 0 - tamsulosin (FLOMAX) 0.4 MG CAPS capsule; Take 1 capsule (0.4 mg total) by mouth daily.  Dispense: 15 capsule; Refill: 0  2. Right upper quadrant abdominal pain Plan as above - CT RENAL STONE STUDY   F/u TBD   Alvester Morin, PA-C, DMSc, Nutritionist [facility] [phone]

## 2023-07-11 NOTE — Telephone Encounter (Signed)
  Chief Complaint: Back pain Symptoms: Lower, right sided back pain "Constant spasms, in waves."  9/10. On episode of vomiting, Frequency: Yesterday Pertinent Negatives: Patient denies does not radiate, no dysuria, fever Disposition: [] ED /[] Urgent Care (no appt availability in office) / [x] Appointment(In office/virtual)/ []  [place] [facility]/ [] Home Care/ [] Refused Recommended Disposition /[] [place] [facility]/ []  Follow-up with PCP Additional Notes: Pt did Evisit this AM, prescribed Baclofen, states is not working at all. Took 800mg  IBU, 3 tylenol, one Soma, ineffective.  Calling to request additional pain meds. Advised needs appt. Appt secured for this afternoon. Pt requests if she done NOT need to be seen, please call . Advised plan on keeping appt. Care advise provided, pt verbalizes understanding. Reason for Disposition  [1] SEVERE back pain (e.g., excruciating, unable to do any normal activities) AND [2] not improved 2 hours after pain medicine  Answer Assessment - Initial Assessment Questions 1. ONSET: "When did the pain begin?"      Yesterday AM 2. LOCATION: "Where does it hurt?" (upper, mid or lower back)     Lower mid back 3. SEVERITY: "How bad is the pain?"  (e.g., Scale 1-10; mild, moderate, or severe)   - MILD (1-3): Doesn't interfere with normal activities.    - MODERATE (4-7): Interferes with normal activities or awakens from sleep.    - SEVERE (8-10): Excruciating pain, unable to do any normal activities.      9/10 4. PATTERN: "Is the pain constant?" (e.g., yes, no; constant, intermittent)      In waves, constant 5. RADIATION: "Does the pain shoot into your legs or somewhere else?"     No 6. CAUSE:  "What do you think is causing the back pain?"      Unsure 7. BACK OVERUSE:  "Any recent lifting of heavy objects, strenuous work or exercise?"     No 8. MEDICINES: "What have you taken so far for the pain?" (e.g., nothing, acetaminophen, NSAIDS)     IBU 800mg , 3  tylenol 9. NEUROLOGIC SYMPTOMS: "Do you have any weakness, numbness, or problems with bowel/bladder control?"     no 10. OTHER SYMPTOMS: "Do you have any other symptoms?" (e.g., fever, abdomen pain, burning with urination, blood in urine)       Vomiting, one episode  Protocols used: Back Pain-A-AH

## 2023-07-11 NOTE — Progress Notes (Signed)
We are sorry that you are not feeling well.  Here is how we plan to help!  Based on what you have shared with me it looks like you mostly have acute back pain.  Acute back pain is defined as musculoskeletal pain that can resolve in 1-3 weeks with conservative treatment.  I have prescribe a muscle relaxer- Baclofen 10 mg twice daily as needed. Some patients experience stomach irritation or in increased heartburn with anti-inflammatory drugs.  Please keep in mind that muscle relaxer's can cause fatigue and should not be taken while at work or driving.  Back pain is very common.  The pain often gets better over time.  The cause of back pain is usually not dangerous.  Most people can learn to manage their back pain on their own.  Home Care Stay active.  Start with short walks on flat ground if you can.  Try to walk farther each day. Do not sit, drive or stand in one place for more than 30 minutes.  Do not stay in bed. Do not avoid exercise or work.  Activity can help your back heal faster. Be careful when you bend or lift an object.  Bend at your knees, keep the object close to you, and do not twist. Sleep on a firm mattress.  Lie on your side, and bend your knees.  If you lie on your back, put a pillow under your knees. Only take medicines as told by your doctor. Put ice on the injured area. Put ice in a plastic bag Place a towel between your skin and the bag Leave the ice on for 15-20 minutes, 3-4 times a day for the first 2-3 days. 210 After that, you can switch between ice and heat packs. Ask your doctor about back exercises or massage. Avoid feeling anxious or stressed.  Find good ways to deal with stress, such as exercise.  Get Help Right Way If: Your pain does not go away with rest or medicine. Your pain does not go away in 1 week. You have new problems. You do not feel well. The pain spreads into your legs. You cannot control when you poop (bowel movement) or pee (urinate) You feel  sick to your stomach (nauseous) or throw up (vomit) You have belly (abdominal) pain. You feel like you may pass out (faint). If you develop a fever.  Make Sure you: Understand these instructions. Will watch your condition Will get help right away if you are not doing well or get worse.  Your e-visit answers were reviewed by a board certified advanced clinical practitioner to complete your personal care plan.  Depending on the condition, your plan could have included both over the counter or prescription medications.  If there is a problem please reply  once you have received a response from your provider.  Your safety is important to [place].  If you have drug allergies check your prescription carefully.    You can use MyChart to ask questions about today's visit, request a non-urgent call back, or ask for a work or school excuse for 24 hours related to this e-Visit. If it has been greater than 24 hours you will need to follow up with your provider, or enter a new e-Visit to address those concerns.  You will get an e-mail in the next two days asking about your experience.  I hope that your e-visit has been valuable and will speed your recovery. Thank you for using e-visits.  I provided 5 minutes  of non face-to-face time during this encounter for chart review, medication and order placement, as well as and documentation.

## 2023-07-12 ENCOUNTER — Other Ambulatory Visit: Payer: Self-pay

## 2023-07-12 ENCOUNTER — Other Ambulatory Visit: Payer: Self-pay | Admitting: Internal Medicine

## 2023-07-12 ENCOUNTER — Emergency Department
Admission: EM | Admit: 2023-07-12 | Discharge: 2023-07-12 | Disposition: A | Discharge status: Home / Self Care | Payer: PRIVATE HEALTH INSURANCE | Attending: Emergency Medicine | Admitting: Emergency Medicine

## 2023-07-12 ENCOUNTER — Telehealth: Payer: Self-pay

## 2023-07-12 ENCOUNTER — Ambulatory Visit: Payer: Self-pay

## 2023-07-12 DIAGNOSIS — Z79899 Other long term (current) drug therapy: Secondary | ICD-10-CM | POA: Insufficient documentation

## 2023-07-12 DIAGNOSIS — N200 Calculus of kidney: Secondary | ICD-10-CM | POA: Diagnosis not present

## 2023-07-12 DIAGNOSIS — R109 Unspecified abdominal pain: Secondary | ICD-10-CM

## 2023-07-12 DIAGNOSIS — I10 Essential (primary) hypertension: Secondary | ICD-10-CM | POA: Diagnosis not present

## 2023-07-12 DIAGNOSIS — F39 Unspecified mood [affective] disorder: Secondary | ICD-10-CM

## 2023-07-12 DIAGNOSIS — J45909 Unspecified asthma, uncomplicated: Secondary | ICD-10-CM | POA: Diagnosis not present

## 2023-07-12 DIAGNOSIS — R1031 Right lower quadrant pain: Secondary | ICD-10-CM | POA: Diagnosis present

## 2023-07-12 LAB — CBC WITH DIFFERENTIAL/PLATELET
Abs Immature Granulocytes: 0.02 10*3/uL (ref 0.00–0.07)
Basophils Absolute: 0 10*3/uL (ref 0.0–0.1)
Basophils Relative: 0 %
Eosinophils Absolute: 0 10*3/uL (ref 0.0–0.5)
Eosinophils Relative: 0 %
HCT: 43.7 % (ref 36.0–46.0)
Hemoglobin: 14.4 g/dL (ref 12.0–15.0)
Immature Granulocytes: 0 %
Lymphocytes Relative: 21 %
Lymphs Abs: 2.2 10*3/uL (ref 0.7–4.0)
MCH: 29 pg (ref 26.0–34.0)
MCHC: 33 g/dL (ref 30.0–36.0)
MCV: 87.9 fL (ref 80.0–100.0)
Monocytes Absolute: 0.4 10*3/uL (ref 0.1–1.0)
Monocytes Relative: 4 %
Neutro Abs: 7.6 10*3/uL (ref 1.7–7.7)
Neutrophils Relative %: 75 %
Platelets: 342 10*3/uL (ref 150–400)
RBC: 4.97 MIL/uL (ref 3.87–5.11)
RDW: 12.8 % (ref 11.5–15.5)
WBC: 10.2 10*3/uL (ref 4.0–10.5)
nRBC: 0 % (ref 0.0–0.2)

## 2023-07-12 LAB — COMPREHENSIVE METABOLIC PANEL
ALT: 20 U/L (ref 0–44)
AST: 24 U/L (ref 15–41)
Albumin: 4.4 g/dL (ref 3.5–5.0)
Alkaline Phosphatase: 71 U/L (ref 38–126)
Anion gap: 12 (ref 5–15)
BUN: 16 mg/dL (ref 8–23)
CO2: 27 mmol/L (ref 22–32)
Calcium: 9.1 mg/dL (ref 8.9–10.3)
Chloride: 97 mmol/L — ABNORMAL LOW (ref 98–111)
Creatinine, Ser: 0.86 mg/dL (ref 0.44–1.00)
GFR, Estimated: >60 mL/min (ref >60)
Glucose, Bld: 123 mg/dL — ABNORMAL HIGH (ref 70–99)
Potassium: 3.3 mmol/L — ABNORMAL LOW (ref 3.5–5.1)
Sodium: 136 mmol/L (ref 135–145)
Total Bilirubin: 0.9 mg/dL (ref 0.3–1.2)
Total Protein: 7.7 g/dL (ref 6.5–8.1)

## 2023-07-12 MED ORDER — SODIUM CHLORIDE 0.9 % IV BOLUS
1000.0000 mL | Freq: Once | INTRAVENOUS | Status: AC
  Administered 2023-07-12: 1000 mL via INTRAVENOUS

## 2023-07-12 MED ORDER — ONDANSETRON HCL 4 MG/2ML IJ SOLN
INTRAMUSCULAR | Status: AC
  Filled 2023-07-12: qty 2

## 2023-07-12 MED ORDER — SODIUM CHLORIDE 0.9 % IV SOLN
1.5000 mg/kg | Freq: Once | INTRAVENOUS | Status: AC
  Administered 2023-07-12: 120 mg via INTRAVENOUS
  Filled 2023-07-12: qty 6

## 2023-07-12 MED ORDER — LIDOCAINE 5 % EX PTCH
1.0000 | MEDICATED_PATCH | CUTANEOUS | Status: DC
  Filled 2023-07-12: qty 1

## 2023-07-12 MED ORDER — ONDANSETRON 4 MG PO TBDP
4.0000 mg | ORAL_TABLET | Freq: Once | ORAL | Status: AC
  Administered 2023-07-12: 4 mg via ORAL
  Filled 2023-07-12: qty 1

## 2023-07-12 MED ORDER — ACETAMINOPHEN 500 MG PO TABS
1000.0000 mg | ORAL_TABLET | Freq: Once | ORAL | Status: AC
  Administered 2023-07-12: 1000 mg via ORAL
  Filled 2023-07-12: qty 2

## 2023-07-12 MED ORDER — ONDANSETRON HCL 4 MG/2ML IJ SOLN
4.0000 mg | Freq: Once | INTRAMUSCULAR | Status: AC
  Administered 2023-07-12: 4 mg via INTRAVENOUS

## 2023-07-12 MED ORDER — KETOROLAC TROMETHAMINE 15 MG/ML IJ SOLN
15.0000 mg | Freq: Once | INTRAMUSCULAR | Status: AC
  Administered 2023-07-12: 15 mg via INTRAVENOUS
  Filled 2023-07-12: qty 1

## 2023-07-12 MED ORDER — MORPHINE SULFATE (PF) 4 MG/ML IV SOLN
4.0000 mg | Freq: Once | INTRAVENOUS | Status: AC
  Administered 2023-07-12: 4 mg via INTRAVENOUS
  Filled 2023-07-12: qty 1

## 2023-07-12 NOTE — ED Provider Notes (Signed)
[facility] Provider Note    Event Date/Time   First MD Initiated Contact with Patient 07/12/23 1831     (approximate)   History   Flank Pain   HPI  Rachyl Bek is a 62 y.o. female   Past medical history of arthritis, asthma, migraines, prior stroke presents to the emergency department with right-sided flank pain.  Has been ongoing for 2 days.  Met with a urologist yesterday and was found to have bilateral nephrolithiasis without obstruction on the CT scan yesterday.  She denies any dysuria and had a normal urinalysis without signs of infection yesterday.  She was prescribed some pain medications as well as Flomax for which she has been taking but the pain continues.  Described as severe intermittent pain to the right flank without radiation.   She denies any trauma, motor or sensory changes, incontinence.  Independent Historian contributed to assessment above: Her husband is at bedside to corroborate information given above  External Medical Documents Reviewed: PMD note from yesterday describing her right-sided flank pain consistent with renal colic and a CT scan obtained as an outpatient showing bilateral kidney stones, no obstruction, but an 8 cm stone in the right kidney.      Physical Exam   Triage Vital Signs: ED Triage Vitals  Encounter Vitals Group     BP 07/12/23 1643 (!) 149/90     Systolic BP Percentile --      Diastolic BP Percentile --      Pulse Rate 07/12/23 1643 (!) 110     Resp 07/12/23 1643 18     Temp 07/12/23 1643 98.4 F (36.9 C)     Temp Source 07/12/23 1643 Oral     SpO2 07/12/23 1643 97 %     Weight --      Height --      Head Circumference --      Peak Flow --      Pain Score 07/12/23 1644 7     Pain Loc --      Pain Education --      Exclude from Growth Chart --     Most recent vital signs: Vitals:   07/12/23 1643 07/12/23 2000  BP: (!) 149/90 (!) 163/74  Pulse: (!) 110 77  Resp: 18 18  Temp: 98.4 F  (36.9 C)   SpO2: 97% 93%    General: Awake, no distress.  CV:  Good peripheral perfusion.  Resp:  Normal effort.  Abd:  No distention.  Other:  Right-sided CVA tenderness, soft nontender abdomen to deep palpation all quadrants otherwise.  Hypertensive tachycardic.  Afebrile.   ED Results / Procedures / Treatments   Labs (all labs ordered are listed, but only abnormal results are displayed) Labs Reviewed  COMPREHENSIVE METABOLIC PANEL - Abnormal; Notable for the following components:      Result Value   Potassium 3.3 (*)    Chloride 97 (*)    Glucose, Bld 123 (*)    All other components within normal limits  CBC WITH DIFFERENTIAL/PLATELET     I ordered and reviewed the above labs they are notable for her creatinine is at baseline at 0.86, no leukocytosis, H&H within normal limits.     PROCEDURES:  Critical Care performed: No  Procedures   MEDICATIONS ORDERED IN ED: Medications  lidocaine (LIDODERM) 5 % 1 patch (1 patch Transdermal Not Given 07/12/23 1902)  ondansetron (ZOFRAN-ODT) disintegrating tablet 4 mg (4 mg Oral Given 07/12/23 1647)  ketorolac (  TORADOL) 15 MG/ML injection 15 mg (15 mg Intravenous Given 07/12/23 1858)  morphine (PF) 4 MG/ML injection 4 mg (4 mg Intravenous Given 07/12/23 1858)  acetaminophen (TYLENOL) tablet 1,000 mg (1,000 mg Oral Given 07/12/23 1903)  sodium chloride 0.9 % bolus 1,000 mL (0 mLs Intravenous Stopped 07/12/23 2025)  ondansetron (ZOFRAN) injection 4 mg ( Intravenous Not Given 07/12/23 1912)  lidocaine (XYLOCAINE) 120 mg in sodium chloride 0.9 % 100 mL IVPB (120 mg Intravenous New Bag/Given 07/12/23 2119)     IMPRESSION / MDM / ASSESSMENT AND PLAN / ED COURSE  I reviewed the triage vital signs and the nursing notes.                                Patient's presentation is most consistent with acute presentation with potential threat to life or bodily function.  Differential diagnosis includes, but is not limited to, renal colic,  nephrolithiasis, obstructive uropathy, pyelonephritis, lumbar strain   The patient is on the cardiac monitor to evaluate for evidence of arrhythmia and/or significant heart rate changes.  MDM:    Patient with known bilateral kidney stones with a large kidney stone in the right renal pole, without obstruction on imaging yesterday with unchanged severe intermittent pain to the right flank which has not gotten better with her oral outpatient medications.  She has CVA tenderness.  I suspect this is from her kidney stone.  Her creatinine is normal.  There is no leukocytosis.  Her urinalysis from yesterday showed no infection.  We will focus on pain control with IV morphine IV ketorolac, IV fluids and reassess.  Refractory to initial pain control, though moderate improvement, then with very good improvement with IV lidocaine.  Given pain controlled, urinalysis yesterday showed no signs of infection she has no leukocytosis or fever today, doubt concomitant infection.  Pain controlled and I gave her follow-up information with urologist Dr. Lonna Cobb to call first thing in the morning given the size of her stone may need procedure.  She understands to return with any worsening        FINAL CLINICAL IMPRESSION(S) / ED DIAGNOSES   Final diagnoses:  Kidney stone  Right flank pain     Rx / DC Orders   ED Discharge Orders     None        Note:  This document was prepared using Dragon voice recognition software and may include unintentional dictation errors.    Pilar Jarvis, MD 07/12/23 [phone]

## 2023-07-12 NOTE — Telephone Encounter (Signed)
Chief Complaint: severe pain Symptoms: severe flank pain  Frequency: constant  Pertinent Negatives: Patient denies SOB, chest pain Disposition: [] ED /[] Urgent Care (no appt availability in office) / [] Appointment(In office/virtual)/ []  [place] [facility]/ [] Home Care/ [x] Refused Recommended Disposition /[] [place] [facility]/ []  Follow-up with PCP Additional Notes: Spoke to patient and husband on the line. Patient is experiencing severe right side flank pain. Patient has confirmed kidney stones and the pain medication that was prescribed yesterday is not relieving the 10/10 pain that the patient is reporting. Care advice given and recommended the patient go to the ED. Patient and husband declined. I attempted to call the urology office to help patient schedule an appointment but no answer from the office. Attempted to call the patient's husband back to inform him that I was not able to contact the urology office but his voicemail box was full. Will forward message to provider for additional recommendations.   Reason for Disposition  [1] SEVERE pain (e.g., excruciating, scale 8-10) AND [2] not improved after pain medicine  Answer Assessment - Initial Assessment Questions 1. LOCATION: "Where does it hurt?" (e.g., left, right)     Right side  2. ONSET: "When did the pain start?"     Sunday  3. SEVERITY: "How bad is the pain?" (e.g., Scale 1-10; mild, moderate, or severe)   - MILD (1-3): doesn\'t interfere with normal activities    - MODERATE (4-7): interferes with normal activities or awakens from sleep    - SEVERE (8-10): excruciating pain and patient unable to do normal activities (stays in bed)       10 /10 4. PATTERN: "Does the pain come and go, or is it constant?"      Constant  5. CAUSE: "What do you think is causing the pain?"     Kidney stones  6. OTHER SYMPTOMS:  "Do you have any other symptoms?" (e.g., fever, abdomen pain, vomiting, leg weakness, burning with urination, blood in  urine)     I feel weak and vomiting  Protocols used: Flank Pain-A-AH

## 2023-07-12 NOTE — ED Notes (Signed)
Pt was given urine cup to provide urine sample, pt stated she is unable to pee at this time.

## 2023-07-12 NOTE — Telephone Encounter (Signed)
Pt called - She is heading to ED for care. She had arrived by end of the conversation.

## 2023-07-12 NOTE — ED Triage Notes (Signed)
Pt presents to ED with c/o of kidneys stones, pt states R side is worse and also states L side hurts as well. Pt states she was seen by urology and states "the Norco" is not working. Pt states CT scan done outpatient yesterday. Pt denies urinary s/s.

## 2023-07-12 NOTE — Discharge Instructions (Addendum)
You have a large kidney stone on the right side that is accounting for your right side pain.  The size of the kidney stone makes it more unlikely to pass on its own and so I think it is important that you call Dr. Lonna Cobb of urology first thing in the morning to schedule a follow-up appointment and inquire about a procedure that can break up the kidney stone.  Take acetaminophen 650 mg and ibuprofen 400 mg every 6 hours for pain.  Take with food. Continue to take the narcotic pain medication prescribed to you by your primary doctor in cases of severe pain.  Continue to take Flomax as prescribed by your primary doctor as well.  Thank you for choosing [place] for your health care today!  Please see your primary doctor this week for a follow up appointment.   If you have any new, worsening, or unexpected symptoms call your doctor right away or come back to the emergency department for reevaluation.  It was my pleasure to care for you today.   Daneil Dan Modesto Charon, MD

## 2023-07-12 NOTE — Telephone Encounter (Signed)
Pt called to give update. At 0100 pain under control- no stones have passed. Pt stated : I would like to see urologist ASAP. Vomiting yest during the night due to pain. Pain Scale 1-2/10  Ate breakfast. Written documentation of pt needing to be OOW since yesterday since on Norco.Please send through MyChart. Note: Pt is taking Norco every 4 hours instead of every 6 hours to control pain - may need earlier refill.

## 2023-07-13 ENCOUNTER — Encounter (HOSPITAL_COMMUNITY): Payer: Self-pay

## 2023-07-13 ENCOUNTER — Telehealth: Payer: Self-pay

## 2023-07-13 ENCOUNTER — Emergency Department (HOSPITAL_COMMUNITY): Payer: PRIVATE HEALTH INSURANCE

## 2023-07-13 ENCOUNTER — Emergency Department (HOSPITAL_COMMUNITY)
Admission: EM | Admit: 2023-07-13 | Discharge: 2023-07-13 | Disposition: A | Discharge status: Home / Self Care | Payer: PRIVATE HEALTH INSURANCE | Attending: Emergency Medicine | Admitting: Emergency Medicine

## 2023-07-13 DIAGNOSIS — E876 Hypokalemia: Secondary | ICD-10-CM | POA: Insufficient documentation

## 2023-07-13 DIAGNOSIS — Z7982 Long term (current) use of aspirin: Secondary | ICD-10-CM | POA: Diagnosis not present

## 2023-07-13 DIAGNOSIS — R109 Unspecified abdominal pain: Secondary | ICD-10-CM | POA: Diagnosis present

## 2023-07-13 DIAGNOSIS — M545 Low back pain, unspecified: Secondary | ICD-10-CM | POA: Diagnosis not present

## 2023-07-13 LAB — URINALYSIS, ROUTINE W REFLEX MICROSCOPIC
Bilirubin Urine: NEGATIVE
Glucose, UA: NEGATIVE mg/dL
Hgb urine dipstick: NEGATIVE
Ketones, ur: 5 mg/dL — AB
Leukocytes,Ua: NEGATIVE
Nitrite: NEGATIVE
Protein, ur: NEGATIVE mg/dL
Specific Gravity, Urine: 1.024 (ref 1.005–1.030)
pH: 5 (ref 5.0–8.0)

## 2023-07-13 LAB — COMPREHENSIVE METABOLIC PANEL
ALT: 20 U/L (ref 0–44)
AST: 24 U/L (ref 15–41)
Albumin: 3.9 g/dL (ref 3.5–5.0)
Alkaline Phosphatase: 61 U/L (ref 38–126)
Anion gap: 10 (ref 5–15)
BUN: 13 mg/dL (ref 8–23)
CO2: 28 mmol/L (ref 22–32)
Calcium: 8.5 mg/dL — ABNORMAL LOW (ref 8.9–10.3)
Chloride: 98 mmol/L (ref 98–111)
Creatinine, Ser: 0.89 mg/dL (ref 0.44–1.00)
GFR, Estimated: >60 mL/min (ref >60)
Glucose, Bld: 97 mg/dL (ref 70–99)
Potassium: 3.1 mmol/L — ABNORMAL LOW (ref 3.5–5.1)
Sodium: 136 mmol/L (ref 135–145)
Total Bilirubin: 0.6 mg/dL (ref 0.3–1.2)
Total Protein: 6.7 g/dL (ref 6.5–8.1)

## 2023-07-13 LAB — CBC WITH DIFFERENTIAL/PLATELET
Abs Immature Granulocytes: 0.02 10*3/uL (ref 0.00–0.07)
Basophils Absolute: 0 10*3/uL (ref 0.0–0.1)
Basophils Relative: 0 %
Eosinophils Absolute: 0.1 10*3/uL (ref 0.0–0.5)
Eosinophils Relative: 1 %
HCT: 40.7 % (ref 36.0–46.0)
Hemoglobin: 13.1 g/dL (ref 12.0–15.0)
Immature Granulocytes: 0 %
Lymphocytes Relative: 35 %
Lymphs Abs: 2.9 10*3/uL (ref 0.7–4.0)
MCH: 28.6 pg (ref 26.0–34.0)
MCHC: 32.2 g/dL (ref 30.0–36.0)
MCV: 88.9 fL (ref 80.0–100.0)
Monocytes Absolute: 0.4 10*3/uL (ref 0.1–1.0)
Monocytes Relative: 5 %
Neutro Abs: 4.8 10*3/uL (ref 1.7–7.7)
Neutrophils Relative %: 59 %
Platelets: 311 10*3/uL (ref 150–400)
RBC: 4.58 MIL/uL (ref 3.87–5.11)
RDW: 13 % (ref 11.5–15.5)
WBC: 8.3 10*3/uL (ref 4.0–10.5)
nRBC: 0 % (ref 0.0–0.2)

## 2023-07-13 MED ORDER — CYCLOBENZAPRINE HCL 10 MG PO TABS: 5.0000 mg | ORAL_TABLET | Freq: Every day | ORAL | 0 refills | Status: DC

## 2023-07-13 MED ORDER — ONDANSETRON HCL 4 MG PO TABS: 4.0000 mg | ORAL_TABLET | Freq: Four times a day (QID) | ORAL | 0 refills | Status: DC

## 2023-07-13 MED ORDER — KETOROLAC TROMETHAMINE 15 MG/ML IJ SOLN: 15.0000 mg | Freq: Once | INTRAMUSCULAR | Status: DC

## 2023-07-13 MED ORDER — ONDANSETRON 4 MG PO TBDP: 4.0000 mg | ORAL_TABLET | Freq: Three times a day (TID) | ORAL | 0 refills | Status: DC | PRN

## 2023-07-13 MED ORDER — POTASSIUM CHLORIDE CRYS ER 20 MEQ PO TBCR: 20.0000 meq | EXTENDED_RELEASE_TABLET | Freq: Every day | ORAL | 0 refills | Status: DC

## 2023-07-13 MED ORDER — CYCLOBENZAPRINE HCL 10 MG PO TABS: 5.0000 mg | ORAL_TABLET | Freq: Every day | ORAL | 0 refills | Status: AC

## 2023-07-13 MED ORDER — POTASSIUM CHLORIDE 20 MEQ PO PACK
20.0000 meq | PACK | Freq: Once | ORAL | Status: AC
  Administered 2023-07-13: 20 meq via ORAL
  Filled 2023-07-13: qty 1

## 2023-07-13 MED ORDER — ONDANSETRON HCL 4 MG/2ML IJ SOLN
4.0000 mg | Freq: Once | INTRAMUSCULAR | Status: AC
  Administered 2023-07-13: 4 mg via INTRAVENOUS
  Filled 2023-07-13: qty 2

## 2023-07-13 MED ORDER — KETOROLAC TROMETHAMINE 30 MG/ML IJ SOLN
30.0000 mg | Freq: Once | INTRAMUSCULAR | Status: AC
  Administered 2023-07-13: 30 mg via INTRAVENOUS
  Filled 2023-07-13: qty 1

## 2023-07-13 MED ORDER — OXYCODONE HCL 5 MG PO TABS
5.0000 mg | ORAL_TABLET | ORAL | Status: DC
  Filled 2023-07-13: qty 1

## 2023-07-13 NOTE — ED Triage Notes (Signed)
Pt seen last night at [facility]; hx kidney stones; c/o R flank pain; ct scan showed numerous kidney stones, including 8.7 mm stone of R lower pole; was directed to follow up with urologist but can't get be seen until 8/23; endorses continued N/V due to pain

## 2023-07-13 NOTE — ED Notes (Signed)
Pt refused pain meds at triage, states Oxycodone does not help. She is requesting IV pain medication. She was informed that a provider will see her when she gets taken to the back and they will be the ones making any decisions about IV pain medications. Pt verbalized understanding.

## 2023-07-13 NOTE — ED Provider Notes (Signed)
[place] EMERGENCY DEPARTMENT AT [facility] Provider Note   CSN: [id number] Arrival date & time: 07/13/23  1435     History  Chief Complaint  Patient presents with   Flank Pain    [name] is a 62 y.o. female presents with concern for right flank pain for the past 3 days.  States this pain is fairly constant with intermittent periods of worsening.  When he gets worse, this causes her to vomit.  Denies any dysuria or hematuria.  Denies any history of kidney stones.  Denies any fevers or chills, shortness of breath, or chest pain.  Denies any trauma to the area.  Was evaluated at [facility] yesterday and found to have bilateral nonobstructing kidney stones.  No signs of infection yesterday.  Was discharged with instructions to follow-up with urology  HPI     Home Medications Prior to Admission medications   Medication Sig Start Date End Date Taking? Authorizing Provider  cyclobenzaprine (FLEXERIL) 10 MG tablet Take 0.5 tablets (5 mg total) by mouth at bedtime for 7 days. 07/13/23 07/20/23 Yes Arabella Merles, PA-C  ondansetron (ZOFRAN-ODT) 4 MG disintegrating tablet Take 1 tablet (4 mg total) by mouth every 8 (eight) hours as needed for nausea or vomiting. 07/13/23  Yes Arabella Merles, PA-C  potassium chloride SA (KLOR-CON M) 20 MEQ tablet Take 1 tablet (20 mEq total) by mouth daily for 2 doses. 07/13/23 07/15/23 Yes Arabella Merles, PA-C  acetaminophen (TYLENOL) 650 MG CR tablet Take 650 mg by mouth 2 (two) times daily as needed for pain.    [provider]  albuterol (PROVENTIL) (2.5 MG/3ML) 0.083% nebulizer solution Take 3 mLs (2.5 mg total) by nebulization every 6 (six) hours as needed for wheezing or shortness of breath. 01/05/19   Reubin Milan, MD  aspirin EC 81 MG EC tablet Take 1 tablet (81 mg total) by mouth daily. Swallow whole. 12/20/20   Lewie Chamber, MD  atorvastatin (LIPITOR) 20 MG tablet Take 2 tablets (40 mg total)  by mouth daily. 07/06/23   Reubin Milan, MD  baclofen (LIORESAL) 10 MG tablet Take 1 tablet (10 mg total) by mouth 2 (two) times daily. 07/11/23   Freddy Finner, NP  busPIRone (BUSPAR) 5 MG tablet Take 1 tablet (5 mg total) by mouth 2 (two) times daily. Patient taking differently: Take 5 mg by mouth daily. 05/10/23   Reubin Milan, MD  cetirizine (ZYRTEC) 10 MG tablet Take by mouth.    [provider]  citalopram (CELEXA) 20 MG tablet Take 1 tablet (20 mg total) by mouth daily. 07/12/23   Reubin Milan, MD  cyanocobalamin 500 MCG tablet Take 500 mcg by mouth daily.    [provider]  HYDROcodone-acetaminophen (NORCO/VICODIN) 5-325 MG tablet Take 1 tablet by mouth every 6 (six) hours as needed for up to 5 days for moderate pain. 07/11/23 07/16/23  Remo Lipps, PA  loratadine (CLARITIN) 10 MG tablet Take by mouth.    [provider]  melatonin 5 MG TABS Take 5-10 mg by mouth at bedtime as needed (sleep).    [provider]  montelukast (SINGULAIR) 10 MG tablet TAKE 1 TABLET BY MOUTH EVERYDAY AT BEDTIME 02/22/23   Reubin Milan, MD  Multiple Vitamins-Minerals (MULTIVITAMIN ADULTS 50+ PO) Take 1 capsule by mouth daily.    [provider]  nortriptyline (PAMELOR) 10 MG capsule SMARTSIG:1 Capsule(s) By Mouth Every Evening 05/10/23   Reubin Milan, MD  Pyridoxine HCl (VITAMIN B6 PO) Take by mouth.    [provider]  rizatriptan (MAXALT) 10 MG tablet as needed. 01/04/22   [provider]  tamsulosin (FLOMAX) 0.4 MG CAPS capsule Take 1 capsule (0.4 mg total) by mouth daily. 07/11/23   Remo Lipps, PA  Triamcinolone Acetonide (NASACORT ALLERGY 24HR NA) Place 1-2 sprays into both nostrils daily as needed (allergies).    [provider]  Vitamin D, Cholecalciferol, 1000 UNITS CAPS Take 1,000 Units by mouth daily.    [provider]      Allergies    Clavulanic acid, Oxycodone, and Tape    Review of  Systems   Review of Systems  Constitutional:  Negative for chills and fatigue.  Genitourinary:  Positive for flank pain. Negative for dysuria and hematuria.    Physical Exam Updated Vital Signs BP (!) 157/77 (BP Location: Right Arm)   Pulse 73   Temp 99.1 F (37.3 C) (Oral)   Resp 18   SpO2 97%  Physical Exam Vitals and nursing note reviewed.  Constitutional:      General: She is not in acute distress.    Appearance: She is well-developed.     Comments: Laying comfortably in the bed  HENT:     Head: Normocephalic and atraumatic.  Eyes:     Conjunctiva/sclera: Conjunctivae normal.  Cardiovascular:     Rate and Rhythm: Normal rate and regular rhythm.     Heart sounds: No murmur heard. Pulmonary:     Effort: Pulmonary effort is normal. No respiratory distress.     Breath sounds: Normal breath sounds.  Abdominal:     Palpations: Abdomen is soft.     Tenderness: There is no abdominal tenderness.     Comments: No left or right CVA tenderness, mild tenderness to palpation of the right lumbar paraspinal muscles  Musculoskeletal:        General: No swelling.     Cervical back: Neck supple.  Skin:    General: Skin is warm and dry.     Capillary Refill: Capillary refill takes less than 2 seconds.     Comments: No rashes of the back or abdomen  Neurological:     Mental Status: She is alert.  Psychiatric:        Mood and Affect: Mood normal.     ED Results / Procedures / Treatments   Labs (all labs ordered are listed, but only abnormal results are displayed) Labs Reviewed  COMPREHENSIVE METABOLIC PANEL - Abnormal; Notable for the following components:      Result Value   Potassium 3.1 (*)    Calcium 8.5 (*)    All other components within normal limits  URINALYSIS, ROUTINE W REFLEX MICROSCOPIC - Abnormal; Notable for the following components:   Ketones, ur 5 (*)    All other components within normal limits  CBC WITH DIFFERENTIAL/PLATELET    EKG None  Radiology CT  Renal Stone Study  Result Date: 07/13/2023 CLINICAL DATA:  Flank pain.  Concern for kidney stone. EXAM: CT ABDOMEN AND PELVIS WITHOUT CONTRAST TECHNIQUE: Multidetector CT imaging of the abdomen and pelvis was performed following the standard protocol without IV contrast. RADIATION DOSE REDUCTION: This exam was performed according to the departmental dose-optimization program which includes automated exposure control, adjustment of the mA and/or kV according to patient size and/or use of iterative reconstruction technique. COMPARISON:  CT abdomen pelvis dated 07/11/2023. FINDINGS: Evaluation of this exam is limited in the absence of intravenous contrast.  Lower chest: The visualized lung bases are clear. No intra-abdominal free air or free fluid. Hepatobiliary: The liver is unremarkable. No biliary dilatation. The gallbladder is unremarkable. Pancreas: Unremarkable. No pancreatic ductal dilatation or surrounding inflammatory changes. Spleen: Normal in size without focal abnormality. Adrenals/Urinary Tract: The adrenal glands are unremarkable. Extensive bilateral renal nonobstructing calculi as seen on the prior CT. A cluster of adjacent stones in the inferior pole of the right kidney measure up to 8 mm. There is no hydronephrosis or obstructing stone on either side. The visualized ureters and urinary bladder appear unremarkable. Stomach/Bowel: There is moderate amount of stool throughout the colon. There is no bowel obstruction or active inflammation. The appendix is not visualized with certainty. No inflammatory changes identified in the right lower quadrant. Vascular/Lymphatic: Mild aortoiliac atherosclerotic disease. The IVC is unremarkable. No portal venous gas. There is no adenopathy. Reproductive: Hysterectomy.  No adnexal masses. Other: Anterior pelvic wall C-section scar. Small fat containing umbilical hernia. Musculoskeletal: Disc desiccation at L4-5. No acute osseous pathology. IMPRESSION: 1. Extensive  bilateral renal nonobstructing calculi. No hydronephrosis or obstructing stone. 2. No bowel obstruction. 3.  Aortic Atherosclerosis (ICD10-I70.0). Electronically Signed   By: Elgie Collard M.D.   On: 07/13/2023 18:50    Procedures Procedures    Medications Ordered in ED Medications  ketorolac (TORADOL) 30 MG/ML injection 30 mg (30 mg Intravenous Given 07/13/23 1828)  ondansetron (ZOFRAN) injection 4 mg (4 mg Intravenous Given 07/13/23 1828)  potassium chloride (KLOR-CON) packet 20 mEq (20 mEq Oral Given 07/13/23 1949)    ED Course/ Medical Decision Making/ A&P                                 Medical Decision Making Amount and/or Complexity of Data Reviewed Labs: ordered. Radiology: ordered.  Risk Prescription drug management.   62 y.o. female found to have bilateral nephrolithiasis yesterday, nonobstructing presents to the ED for concern of continued right flank pain and vomiting  Differential diagnosis includes but is not limited to nephrolithiasis, shingles, pyelonephritis, UTI, musculoskeletal lower back pain  ED Course:  Patient presents to the ER for continued right flank pain and vomiting.  She states this pain is a constant 5/10, with intermittent worsening that causes her to vomit.  Was evaluated at [facility] yesterday and found to have bilateral kidney stones with no signs of obstruction or infection.  Was discharged with urology follow-up.  She presents today due to concern that she cannot wait for her appointment with urology on 8/23 due to continued pain and vomiting. Upon re-evaluation, patient overall well-appearing and reports pain has been improved with Toradol.  Urinalysis with no signs of hematuria or infection.  CBC with no leukocytosis.  CT scan with nonobstructing stones bilaterally, no signs of hydronephrosis.  Suspect her pain is musculoskeletal as she is tender to palpation over this region, but does not have any frank CVA tenderness, no signs of  passing a stone on CT.  No concerning back pain findings- no fever, loss of bowel or bladder function, sciatica.  Discussed this with patient.  Plan is to discharge with Flexeril and instructed her on ibuprofen and warm compresses use to help manage pain.  Was instructed only take Flexeril before bed as this medication is sedating. Potassium was slightly low at 3.1, 20 mEq potassium given here  Impression: Musculoskeletal right lower back pain  Disposition:  The patient was discharged home with instructions to  take Flexeril and ibuprofen.  Also discharged with Zofran for nausea and potassium.  Follow-up with urology as scheduled. Return precautions given.  Lab Tests: I Ordered, and personally interpreted labs.  The pertinent results include:   CBC with no leukocytosis CMP with slight low potassium at 3.1 Urinalysis with no leukocytes, nitrites, protein, RBCs  Imaging Studies ordered: I ordered imaging studies including CT renal  I independently visualized the imaging with scope of interpretation limited to determining acute life threatening conditions related to emergency care. Imaging showed bilateral renal nonobstructing calculi, no hydronephrosis I agree with the radiologist interpretation   External records from outside source obtained and reviewed including ER visit from yesterday          Final Clinical Impression(s) / ED Diagnoses Final diagnoses:  Acute right-sided low back pain without sciatica  Hypokalemia    Rx / DC Orders ED Discharge Orders          Ordered    ondansetron (ZOFRAN-ODT) 4 MG disintegrating tablet  Every 8 hours PRN        07/13/23 2010    potassium chloride SA (KLOR-CON M) 20 MEQ tablet  Daily        07/13/23 2010    cyclobenzaprine (FLEXERIL) 10 MG tablet  Daily at bedtime        07/13/23 2010              Arabella Merles, Cordelia Poche 07/13/23 2013    Linwood Dibbles, MD 07/14/23 1400

## 2023-07-13 NOTE — Discharge Instructions (Addendum)
You have been seen here today for your right lower back pain.  This is likely due to muscle pain and not due to your stones.  You have been prescribed a muscle relaxer called Flexeril (cyclobenzaprine). You may take 1 tablet (5mg ) before bed as needed for muscle pain. This medication can be sedating. Do not drive or operate heavy machinery after taking this medicine. Do not drink alcohol or take other sedating medications when taking this medicine for safety reasons.  Keep this out of reach of small children.    You may use up to 800mg  ibuprofen every 6 hours as needed for pain.   Please use a heating pack on your back as needed for pain  You have also been prescribed a potassium supplement to use the next 2 days. Your potassium was slightly low here today (3.1). Please take this as prescribed.  Return to the ER if you have any loss of bowel or bladder control, unexplained fevers, numbness or tingling down your legs, any other new or concerning symptoms.

## 2023-07-13 NOTE — Transitions of Care (Post Inpatient/ED Visit) (Signed)
07/13/2023  Name: [name] MRN: [id number] DOB: May 01, 1961  Today's TOC FU Call Status: Today's TOC FU Call Status:: Successful TOC FU Call Completed TOC FU Call Complete Date: 07/13/23  Transition Care Management Follow-up Telephone Call Date of Discharge: 07/12/23 Discharge Facility: [facility]) Type of Discharge: Emergency Department Reason for ED Visit: Other: (Kidney stone) How have you been since you were released from the hospital?: Worse Any questions or concerns?: Yes Patient Questions/Concerns:: (S) pain is worse- WBC is more elevated than normal for her - Patient Questions/Concerns Addressed: (S) Notified Provider of Patient Questions/Concerns  Items Reviewed: Did you receive and understand the discharge instructions provided?: Yes Medications obtained,verified, and reconciled?: Yes (Medications Reviewed) Any new allergies since your discharge?: No Dietary orders reviewed?: Yes Do you have support at home?: Yes  Medications Reviewed Today: Medications Reviewed Today     Reviewed by Merleen Nicely, LPN (Licensed Practical Nurse) on 07/13/23 at 1146  Med List Status: <None>   Medication Order Taking? Sig Documenting Provider Last Dose Status Informant  acetaminophen (TYLENOL) 650 MG CR tablet [id number] Yes Take 650 mg by mouth 2 (two) times daily as needed for pain. [provider] Taking Active Self  albuterol (PROVENTIL) (2.5 MG/3ML) 0.083% nebulizer solution [id number] Yes Take 3 mLs (2.5 mg total) by nebulization every 6 (six) hours as needed for wheezing or shortness of breath. Reubin Milan, MD Taking Active Self  aspirin EC 81 MG EC tablet [id number] Yes Take 1 tablet (81 mg total) by mouth daily. Swallow whole. Lewie Chamber, MD Taking Active Self  atorvastatin (LIPITOR) 20 MG tablet [id number] Yes Take 2 tablets (40 mg total) by mouth daily. Reubin Milan, MD Taking Active   baclofen (LIORESAL) 10 MG tablet  [id number] Yes Take 1 tablet (10 mg total) by mouth 2 (two) times daily. Freddy Finner, NP Taking Active   busPIRone (BUSPAR) 5 MG tablet [id number] Yes Take 1 tablet (5 mg total) by mouth 2 (two) times daily.  Patient taking differently: Take 5 mg by mouth daily.   Reubin Milan, MD Taking Active   cetirizine (ZYRTEC) 10 MG tablet [id number] Yes Take by mouth. [provider] Taking Active   citalopram (CELEXA) 20 MG tablet [id number] Yes Take 1 tablet (20 mg total) by mouth daily. Reubin Milan, MD Taking Active   cyanocobalamin 500 MCG tablet [id number] Yes Take 500 mcg by mouth daily. [provider] Taking Active Self  HYDROcodone-acetaminophen (NORCO/VICODIN) 5-325 MG tablet [id number] Yes Take 1 tablet by mouth every 6 (six) hours as needed for up to 5 days for moderate pain. Remo Lipps, PA Taking Active   loratadine (CLARITIN) 10 MG tablet [id number] Yes Take by mouth. [provider] Taking Active   melatonin 5 MG TABS [id number] Yes Take 5-10 mg by mouth at bedtime as needed (sleep). [provider] Taking Active Self  montelukast (SINGULAIR) 10 MG tablet [id number] Yes TAKE 1 TABLET BY MOUTH EVERYDAY AT BEDTIME Reubin Milan, MD Taking Active   Multiple Vitamins-Minerals (MULTIVITAMIN ADULTS 50+ PO) [id number] Yes Take 1 capsule by mouth daily. [provider] Taking Active Self  nortriptyline (PAMELOR) 10 MG capsule [id number] Yes SMARTSIG:1 Capsule(s) By Mouth Every Evening Reubin Milan, MD Taking Active   Pyridoxine HCl (VITAMIN B6 PO) [id number] Yes Take by mouth. [provider] Taking Active   rizatriptan (MAXALT) 10 MG tablet [id number] Yes as needed. [provider] Taking Active   tamsulosin (FLOMAX) 0.4  MG CAPS capsule [id number] Yes Take 1 capsule (0.4 mg total) by mouth daily. Remo Lipps, PA Taking Active   Triamcinolone Acetonide (NASACORT ALLERGY 24HR NA) [id number] Yes Place 1-2 sprays into both  nostrils daily as needed (allergies). [provider] Taking Active Self  Vitamin D, Cholecalciferol, 1000 UNITS CAPS [id number] Yes Take 1,000 Units by mouth daily. [provider] Taking Active Self            Home Care and Equipment/Supplies: Were Home Health Services Ordered?: No Any new equipment or medical supplies ordered?: No  Functional Questionnaire: Do you need assistance with bathing/showering or dressing?: No Do you need assistance with meal preparation?: No Do you need assistance with eating?: No Do you have difficulty maintaining continence: No Do you need assistance with getting out of bed/getting out of a chair/moving?: No Do you have difficulty managing or taking your medications?: No  Follow up appointments reviewed: PCP Follow-up appointment confirmed?: No MD Provider Line Number:[phone] Given: Yes Specialist Hospital Follow-up appointment confirmed?: Yes Date of Specialist follow-up appointment?: 07/22/23 Follow-Up Specialty Provider:: Dr Apolinar Junes Do you need transportation to your follow-up appointment?: No Do you understand care options if your condition(s) worsen?: Yes-patient verbalized understanding    SIGNATURE  Woodfin Ganja LPN [facility] Nurse Health Advisor Direct Dial [phone]

## 2023-07-14 ENCOUNTER — Encounter: Payer: Self-pay | Admitting: Internal Medicine

## 2023-07-14 ENCOUNTER — Telehealth: Payer: Self-pay

## 2023-07-14 NOTE — Transitions of Care (Post Inpatient/ED Visit) (Signed)
07/14/2023  Name: [name] MRN: [id number] DOB: 06-11-1961  Today's TOC FU Call Status: Today's TOC FU Call Status:: Successful TOC FU Call Completed TOC FU Call Complete Date: 07/14/23  Transition Care Management Follow-up Telephone Call Date of Discharge: 07/13/23 Discharge Facility: [facility]) Type of Discharge: Emergency Department Reason for ED Visit: Other: (Flank Pain/ Kidney Stone) How have you been since you were released from the hospital?: Better Any questions or concerns?: No  Items Reviewed: Did you receive and understand the discharge instructions provided?: No Medications obtained,verified, and reconciled?: Yes (Medications Reviewed) Any new allergies since your discharge?: No Dietary orders reviewed?: No Do you have support at home?: Yes People in Home: spouse  Medications Reviewed Today: Medications Reviewed Today     Reviewed by [name], CMA (Certified Medical Assistant) on 07/14/23 at 1115  Med List Status: <None>   Medication Order Taking? Sig Documenting Provider Last Dose Status Informant  acetaminophen (TYLENOL) 650 MG CR tablet [id number] Yes Take 650 mg by mouth 2 (two) times daily as needed for pain. [provider] Taking Active Self  albuterol (PROVENTIL) (2.5 MG/3ML) 0.083% nebulizer solution [id number] Yes Take 3 mLs (2.5 mg total) by nebulization every 6 (six) hours as needed for wheezing or shortness of breath. Reubin Milan, MD Taking Active Self  aspirin EC 81 MG EC tablet [id number] Yes Take 1 tablet (81 mg total) by mouth daily. Swallow whole. Lewie Chamber, MD Taking Active Self  atorvastatin (LIPITOR) 20 MG tablet [id number] Yes Take 2 tablets (40 mg total) by mouth daily. Reubin Milan, MD Taking Active   baclofen (LIORESAL) 10 MG tablet [id number] Yes Take 1 tablet (10 mg total) by mouth 2 (two) times daily. Freddy Finner, NP Taking Active   busPIRone (BUSPAR) 5 MG tablet [id number] Yes Take 1 tablet (5 mg  total) by mouth 2 (two) times daily.  Patient taking differently: Take 5 mg by mouth daily.   Reubin Milan, MD Taking Active   cetirizine (ZYRTEC) 10 MG tablet [id number] Yes Take by mouth. [provider] Taking Active   citalopram (CELEXA) 20 MG tablet [id number] Yes Take 1 tablet (20 mg total) by mouth daily. Reubin Milan, MD Taking Active   cyanocobalamin 500 MCG tablet [id number] Yes Take 500 mcg by mouth daily. [provider] Taking Active Self  cyclobenzaprine (FLEXERIL) 10 MG tablet [id number] Yes Take 0.5 tablets (5 mg total) by mouth at bedtime for 7 days. Arabella Merles, PA-C Taking Active   HYDROcodone-acetaminophen (NORCO/VICODIN) 5-325 MG tablet [id number] Yes Take 1 tablet by mouth every 6 (six) hours as needed for up to 5 days for moderate pain. Remo Lipps, PA Taking Active   loratadine (CLARITIN) 10 MG tablet [id number] Yes Take by mouth. [provider] Taking Active   melatonin 5 MG TABS [id number] Yes Take 5-10 mg by mouth at bedtime as needed (sleep). [provider] Taking Active Self  montelukast (SINGULAIR) 10 MG tablet [id number] Yes TAKE 1 TABLET BY MOUTH EVERYDAY AT BEDTIME Reubin Milan, MD Taking Active   Multiple Vitamins-Minerals (MULTIVITAMIN ADULTS 50+ PO) [id number] Yes Take 1 capsule by mouth daily. [provider] Taking Active Self  nortriptyline (PAMELOR) 10 MG capsule [id number] Yes SMARTSIG:1 Capsule(s) By Mouth Every Evening Reubin Milan, MD Taking Active   ondansetron Isurgery [name]) 4 MG tablet [id number] Yes Take 1 tablet (4 mg total) by mouth every 6 (six) hours. Arabella Merles, PA-C Taking Active   ondansetron (ZOFRAN-ODT) 4  MG disintegrating tablet [id number] Yes Take 1 tablet (4 mg total) by mouth every 8 (eight) hours as needed for nausea or vomiting. Arabella Merles, PA-C Taking Active   potassium chloride SA (KLOR-CON M) 20 MEQ tablet [id number] Yes Take 1 tablet (20 mEq total) by mouth daily  for 2 doses. Arabella Merles, PA-C Taking Active   Pyridoxine HCl (VITAMIN B6 PO) [id number] Yes Take by mouth. [provider] Taking Active   rizatriptan (MAXALT) 10 MG tablet [id number] Yes as needed. [provider] Taking Active   tamsulosin (FLOMAX) 0.4 MG CAPS capsule [id number] Yes Take 1 capsule (0.4 mg total) by mouth daily. Remo Lipps, PA Taking Active   Triamcinolone Acetonide (NASACORT ALLERGY 24HR NA) [id number] Yes Place 1-2 sprays into both nostrils daily as needed (allergies). [provider] Taking Active Self  Vitamin D, Cholecalciferol, 1000 UNITS CAPS [id number] Yes Take 1,000 Units by mouth daily. [provider] Taking Active Self            Home Care and Equipment/Supplies: Were Home Health Services Ordered?: No Any new equipment or medical supplies ordered?: No  Functional Questionnaire: Do you need assistance with bathing/showering or dressing?: No Do you need assistance with meal preparation?: No Do you need assistance with eating?: No Do you have difficulty maintaining continence: No Do you need assistance with getting out of bed/getting out of a chair/moving?: No Do you have difficulty managing or taking your medications?: No  Follow up appointments reviewed: PCP Follow-up appointment confirmed?: No MD Provider Line Number:[phone] Given: Yes Specialist Hospital Follow-up appointment confirmed?: Yes Date of Specialist follow-up appointment?: 07/22/23 Follow-Up Specialty Provider:: Dr [name] Junes Do you need transportation to your follow-up appointment?: No Do you understand care options if your condition(s) worsen?: Yes-patient verbalized understanding    [name]  Primary Care & Sports Medicine at [facility] [name] CMA, AAMA [address]  [place] [address] Office [phone]  Fax: [phone]

## 2023-07-15 ENCOUNTER — Ambulatory Visit: Payer: PRIVATE HEALTH INSURANCE | Admitting: Physician Assistant

## 2023-07-18 ENCOUNTER — Encounter: Payer: Self-pay | Admitting: Internal Medicine

## 2023-07-18 ENCOUNTER — Ambulatory Visit: Payer: Self-pay

## 2023-07-18 ENCOUNTER — Other Ambulatory Visit: Payer: Self-pay | Admitting: Physician Assistant

## 2023-07-18 DIAGNOSIS — R109 Unspecified abdominal pain: Secondary | ICD-10-CM

## 2023-07-18 NOTE — Telephone Encounter (Signed)
     Chief Complaint: Continued low back pain. Symptoms: Above, pain 10/10 Frequency: "Several weeks" Pertinent Negatives: Patient denies  Disposition: [] ED /[x] Urgent Care (no appt availability in office) / [] Appointment(In office/virtual)/ []  [facility] [facility]/ [] Home Care/ [] Refused Recommended Disposition /[] [facility] [place] [facility]/ []  Follow-up with PCP Additional Notes: Pt. Agrees to go to [facility] as instructed by PCP.  Reason for Disposition  [1] SEVERE back pain (e.g., excruciating, unable to do any normal activities) AND [2] not improved 2 hours after pain medicine  Answer Assessment - Initial Assessment Questions 1. ONSET: "When did the pain begin?"      8 days ago 2. LOCATION: "Where does it hurt?" (upper, mid or lower back)     Lower right side 3. SEVERITY: "How bad is the pain?"  (e.g., Scale 1-10; mild, moderate, or severe)   - MILD (1-3): Doesn't interfere with normal activities.    - MODERATE (4-7): Interferes with normal activities or awakens from sleep.    - SEVERE (8-10): Excruciating pain, unable to do any normal activities.      Now - more than 10 4. PATTERN: "Is the pain constant?" (e.g., yes, no; constant, intermittent)      Constant 5. RADIATION: "Does the pain shoot into your legs or somewhere else?"      6. CAUSE:  "What do you think is causing the back pain?"      Unsure 7. BACK OVERUSE:  "Any recent lifting of heavy objects, strenuous work or exercise?"     No 8. MEDICINES: "What have you taken so far for the pain?" (e.g., nothing, acetaminophen, NSAIDS)     Yes 9. NEUROLOGIC SYMPTOMS: "Do you have any weakness, numbness, or problems with bowel/bladder control?"     No 10. OTHER SYMPTOMS: "Do you have any other symptoms?" (e.g., fever, abdomen pain, burning with urination, blood in urine)       No 11. PREGNANCY: "Is there any chance you are pregnant?" "When was your last menstrual period?"       No  Protocols used: Back Pain-A-AH

## 2023-07-18 NOTE — Telephone Encounter (Signed)
Noted  KP 

## 2023-07-20 NOTE — Telephone Encounter (Signed)
Requested medication (s) are due for refill today: Yes  Requested medication (s) are on the active medication list: Yes  Last refill:  07/11/23 #15, 0RF  Future visit scheduled: No  Notes to clinic:  Unable to refill per protocol due to diagnosis code needed. Pharmacy request 90-day supply     Requested Prescriptions  Pending Prescriptions Disp Refills   tamsulosin (FLOMAX) 0.4 MG CAPS capsule [Pharmacy Med Name: TAMSULOSIN HCL 0.4 MG CAPSULE] 90 capsule 1    Sig: TAKE 1 CAPSULE BY MOUTH EVERY DAY     Urology: Alpha-Adrenergic Blocker Failed - 07/18/2023 11:30 AM      Failed - PSA in normal range and within 360 days    No results found for: "LABPSA", "PSA", "PSA1", "ULTRAPSA"       Failed - Last BP in normal range    BP Readings from Last 1 Encounters:  07/13/23 (!) 157/77         Passed - Valid encounter within last 12 months    Recent Outpatient Visits           1 week ago Acute right flank pain   [facility], Melton Alar, PA   3 weeks ago Annual physical exam   [facility] at [facility], Nyoka Cowden, MD   2 months ago Tinea corporis   [facility] Primary [facility] & Sports Medicine at [facility], Nyoka Cowden, MD   1 year ago Annual physical exam   [facility] at [facility], Nyoka Cowden, MD   2 years ago Left pontine stroke [facility])   [facility] Primary Care & Sports Medicine at [facility], Nyoka Cowden, MD       Future Appointments             In 2 days Vanna Scotland, MD [facility] [place]

## 2023-07-21 ENCOUNTER — Other Ambulatory Visit: Payer: Self-pay

## 2023-07-21 DIAGNOSIS — N200 Calculus of kidney: Secondary | ICD-10-CM

## 2023-07-22 ENCOUNTER — Ambulatory Visit (INDEPENDENT_AMBULATORY_CARE_PROVIDER_SITE_OTHER): Payer: PRIVATE HEALTH INSURANCE | Admitting: Urology

## 2023-07-22 ENCOUNTER — Encounter: Payer: Self-pay | Admitting: Internal Medicine

## 2023-07-22 ENCOUNTER — Other Ambulatory Visit
Admission: RE | Admit: 2023-07-22 | Discharge: 2023-07-22 | Disposition: A | Discharge status: Home / Self Care | Payer: PRIVATE HEALTH INSURANCE | Attending: Urology | Admitting: Urology

## 2023-07-22 VITALS — BP 128/76 | HR 86 | Ht 64.0 in | Wt 177.4 lb

## 2023-07-22 DIAGNOSIS — N200 Calculus of kidney: Secondary | ICD-10-CM | POA: Diagnosis present

## 2023-07-22 DIAGNOSIS — R109 Unspecified abdominal pain: Secondary | ICD-10-CM | POA: Diagnosis not present

## 2023-07-22 LAB — URINALYSIS, COMPLETE (UACMP) WITH MICROSCOPIC
Bilirubin Urine: NEGATIVE
Glucose, UA: NEGATIVE mg/dL
Hgb urine dipstick: NEGATIVE
Ketones, ur: NEGATIVE mg/dL
Nitrite: NEGATIVE
Protein, ur: NEGATIVE mg/dL
RBC / HPF: NONE SEEN RBC/hpf (ref 0–5)
Specific Gravity, Urine: 1.015 (ref 1.005–1.030)
pH: 6 (ref 5.0–8.0)

## 2023-07-22 NOTE — Progress Notes (Signed)
Marcelle Overlie Plume,acting as a scribe for Vanna Scotland, MD.,have documented all relevant documentation on the behalf of Vanna Scotland, MD,as directed by  Vanna Scotland, MD while in the presence of Vanna Scotland, MD.  07/22/2023 12:37 PM   [name] 08-08-1961 [id number]  Referring provider: Remo Lipps, PA [address] [place],  [place] [address]  Chief Complaint  Patient presents with   Establish Care   Nephrolithiasis    HPI: 62 year-old female who presents today for further evaluation of bilateral flank pain.   It appears that she was seen twice in the emergency room on 2 consecutive days complaining of right flank pain. She actually had 2 CT scans both days that show bilateral stones without evidence of ureteral calculi measuring up to 8 mm. The first emergency room note indicates that she had  a urologist the day prior to and was just diagnosed with her non-obstructing stones. Both times she had no evidence of infection. She ended up being treated with Toradol.   She reports that the pain has been persistent for two weeks, localized to her back and underneath her rib, and is alleviated by Flexeril. She has not been able to work for two weeks due to the pain. She has no history of passing stones or having crystals in her urine.  Pain is constantly exacerbated by movement.  She had a stroke two and a half years ago, but no prior CT scans of her kidneys.  Results for orders placed or performed during the hospital encounter of 07/22/23  Urinalysis, Complete w Microscopic -  Result Value Ref Range   Color, Urine YELLOW YELLOW   APPearance CLEAR CLEAR   Specific Gravity, Urine 1.015 1.005 - 1.030   pH 6.0 5.0 - 8.0   Glucose, UA NEGATIVE NEGATIVE mg/dL   Hgb urine dipstick NEGATIVE NEGATIVE   Bilirubin Urine NEGATIVE NEGATIVE   Ketones, ur NEGATIVE NEGATIVE mg/dL   Protein, ur NEGATIVE NEGATIVE mg/dL   Nitrite NEGATIVE NEGATIVE   Leukocytes,Ua TRACE (A)  NEGATIVE   Squamous Epithelial / HPF 0-5 0 - 5 /HPF   WBC, UA 0-5 0 - 5 WBC/hpf   RBC / HPF NONE SEEN 0 - 5 RBC/hpf   Bacteria, UA FEW (A) NONE SEEN   Hyaline Casts, UA PRESENT    Granular Casts, UA PRESENT      PMH: Past Medical History:  Diagnosis Date   Acute CVA (cerebrovascular accident) (HCC) 12/19/2020   Arthritis    hands, feet, knees   Asthma    Migraine    Mood change    due to menopause/ hot flashes   PONV (postoperative nausea and vomiting)     Surgical History: Past Surgical History:  Procedure Laterality Date   APPENDECTOMY     CESAREAN SECTION     x 1   COLONOSCOPY  2012   due in 2017 for suspicious lesion   COLONOSCOPY WITH PROPOFOL N/A 08/20/2016   Procedure: COLONOSCOPY WITH PROPOFOL;  Surgeon: Midge Minium, MD;  [facility];  Service: Endoscopy;  Laterality: N/A;   REPAIR ANKLE LIGAMENT     from fall   ROTATOR CUFF REPAIR Left    shoulder   TONSILLECTOMY     TOTAL ABDOMINAL HYSTERECTOMY      Home Medications:  Allergies as of 07/22/2023       Reactions   Clavulanic Acid Diarrhea   Oxycodone Itching   Tape Rash  Medication List        Accurate as of July 22, 2023 12:37 PM. If you have any questions, ask your nurse or doctor.          STOP taking these medications    baclofen 10 MG tablet Commonly known as: LIORESAL Stopped by: Vanna Scotland   ondansetron 4 MG disintegrating tablet Commonly known as: ZOFRAN-ODT Stopped by: Vanna Scotland   potassium chloride SA 20 MEQ tablet Commonly known as: KLOR-CON M Stopped by: Vanna Scotland   VITAMIN B6 PO Stopped by: Vanna Scotland       TAKE these medications    acetaminophen 650 MG CR tablet Commonly known as: TYLENOL Take 650 mg by mouth 2 (two) times daily as needed for pain.   albuterol (2.5 MG/3ML) 0.083% nebulizer solution Commonly known as: PROVENTIL Take 3 mLs (2.5 mg total) by nebulization every 6 (six) hours as needed for wheezing or  shortness of breath.   aspirin EC 81 MG tablet Take 1 tablet (81 mg total) by mouth daily. Swallow whole.   atorvastatin 20 MG tablet Commonly known as: LIPITOR Take 2 tablets (40 mg total) by mouth daily.   busPIRone 5 MG tablet Commonly known as: BUSPAR Take 1 tablet (5 mg total) by mouth 2 (two) times daily. What changed: when to take this   cetirizine 10 MG tablet Commonly known as: ZYRTEC Take by mouth.   citalopram 20 MG tablet Commonly known as: CELEXA Take 1 tablet (20 mg total) by mouth daily.   cyanocobalamin 500 MCG tablet Commonly known as: VITAMIN B12 Take 500 mcg by mouth daily.   loratadine 10 MG tablet Commonly known as: CLARITIN Take by mouth.   melatonin 5 MG Tabs Take 5-10 mg by mouth at bedtime as needed (sleep).   montelukast 10 MG tablet Commonly known as: SINGULAIR TAKE 1 TABLET BY MOUTH EVERYDAY AT BEDTIME   MULTIVITAMIN ADULTS 50+ PO Take 1 capsule by mouth daily.   NASACORT ALLERGY 24HR NA Place 1-2 sprays into both nostrils daily as needed (allergies).   nortriptyline 10 MG capsule Commonly known as: PAMELOR SMARTSIG:1 Capsule(s) By Mouth Every Evening   ondansetron 4 MG tablet Commonly known as: ZOFRAN Take 1 tablet (4 mg total) by mouth every 6 (six) hours.   rizatriptan 10 MG tablet Commonly known as: MAXALT as needed.   tamsulosin 0.4 MG Caps capsule Commonly known as: FLOMAX Take 1 capsule (0.4 mg total) by mouth daily.   Vitamin D (Cholecalciferol) 25 MCG (1000 UT) Caps Take 1,000 Units by mouth daily.        Allergies:  Allergies  Allergen Reactions   Clavulanic Acid Diarrhea   Oxycodone Itching   Tape Rash    Family History: Family History  Problem Relation Age of Onset   Diabetes Paternal Uncle    Breast cancer Paternal Grandmother 62   CAD Father     Social History:  reports that she has never smoked. She has never used smokeless tobacco. She reports current alcohol use. She reports that she does  not use drugs.   Physical Exam: BP 128/76   Pulse 86   Ht 5\' 4"  (1.626 m)   Wt 177 lb 6 oz (80.5 kg)   BMI 30.45 kg/m   Constitutional:  Alert and oriented, No acute distress. HEENT: [place] AT, moist mucus membranes.  Trachea midline, no masses. Neurologic: Grossly intact, no focal deficits, moving all 4 extremities. Psychiatric: Normal mood and affect.   Pertinent Imaging: EXAM: CT  ABDOMEN AND PELVIS WITHOUT CONTRAST  TECHNIQUE: Multidetector CT imaging of the abdomen and pelvis was performed following the standard protocol without IV contrast.  RADIATION DOSE REDUCTION: This exam was performed according to the departmental dose-optimization program which includes automated exposure control, adjustment of the mA and/or kV according to patient size and/or use of iterative reconstruction technique.  COMPARISON:  CT abdomen pelvis dated 07/11/2023.  FINDINGS: Evaluation of this exam is limited in the absence of intravenous contrast.  Lower chest: The visualized lung bases are clear.  No intra-abdominal free air or free fluid.  Hepatobiliary: The liver is unremarkable. No biliary dilatation. The gallbladder is unremarkable.  Pancreas: Unremarkable. No pancreatic ductal dilatation or surrounding inflammatory changes.  Spleen: Normal in size without focal abnormality.  Adrenals/Urinary Tract: The adrenal glands are unremarkable. Extensive bilateral renal nonobstructing calculi as seen on the prior CT. A cluster of adjacent stones in the inferior pole of the right kidney measure up to 8 mm. There is no hydronephrosis or obstructing stone on either side. The visualized ureters and urinary bladder appear unremarkable.  Stomach/Bowel: There is moderate amount of stool throughout the colon. There is no bowel obstruction or active inflammation. The appendix is not visualized with certainty. No inflammatory changes identified in the right lower  quadrant.  Vascular/Lymphatic: Mild aortoiliac atherosclerotic disease. The IVC is unremarkable. No portal venous gas. There is no adenopathy.  Reproductive: Hysterectomy.  No adnexal masses.  Other: Anterior pelvic wall C-section scar. Small fat containing umbilical hernia.  Musculoskeletal: Disc desiccation at L4-5. No acute osseous pathology.  IMPRESSION: 1. Extensive bilateral renal nonobstructing calculi. No hydronephrosis or obstructing stone. 2. No bowel obstruction. 3.  Aortic Atherosclerosis (ICD10-I70.0).   Electronically Signed By: Elgie Collard M.D. On: 07/13/2023 18:50  This was personally reviewed and I agree with the radiologic interpretation.   EXAM: CT ABDOMEN AND PELVIS WITHOUT CONTRAST   TECHNIQUE: Multidetector CT imaging of the abdomen and pelvis was performed following the standard protocol without IV contrast.   RADIATION DOSE REDUCTION: This exam was performed according to the departmental dose-optimization program which includes automated exposure control, adjustment of the mA and/or kV according to patient size and/or use of iterative reconstruction technique.   COMPARISON:  None Available.   FINDINGS: Lower chest: Lung bases are clear.   Hepatobiliary: No focal liver abnormality is seen. No gallstones, gallbladder wall thickening, or biliary dilatation.   Pancreas: Unremarkable. No pancreatic ductal dilatation or surrounding inflammatory changes.   Spleen: Normal in size without focal abnormality.   Adrenals/Urinary Tract: No adrenal gland nodules. Numerous bilateral renal stones. Largest is in the right lower pole measuring 8.7 mm in diameter. There is no hydronephrosis or hydroureter. No ureteral stones. No bladder stones. Bladder is normal.   Stomach/Bowel: Stomach is within normal limits. Appendix is not identified. No evidence of bowel wall thickening, distention, or inflammatory changes.   Vascular/Lymphatic: Aortic  atherosclerosis. No enlarged abdominal or pelvic lymph nodes.   Reproductive: Status post hysterectomy. No adnexal masses.   Other: No abdominal wall hernia or abnormality. No abdominopelvic ascites.   Musculoskeletal: No acute or significant osseous findings.   IMPRESSION: Numerous bilateral intrarenal stones without evidence of obstruction.     Electronically Signed   By: Burman Nieves M.D.   On: 07/11/2023 18:04  This was personally reviewed and I agree with the radiologic interpretation.   Assessment & Plan:    1. Bilateral nephrolithiasis/ nephrocalcinosis - She has no previous scans for comparison - Likely a  chronic issue, possibly lifelong -No personal history of stone passage no issues with this kidney historically - All of her urinalyses and workups have been negative - Unlikely that her pain was related to these stones - Would not recommend surgical intervention for these - Can plan for surveillance of these to ensure that she is not metabolically active but I suspect the latter.  - Can also consider metabolic evaluation down the road if she does appear to be metabolically active - Advise the patient to avoid extra calcium supplements and Tums to prevent further calcification.  2. Right flank pain - Likely musculoskeletal in origin. Pain is constant, tender to touch, and alleviated by Flexeril. - Recommend follow-up with primary care physician, Dr. Judithann Graves, for further management and possible referral to a specialist for musculoskeletal pain.  Return in about 9 months (around 04/20/2024) for KUB .  I have reviewed the above documentation for accuracy and completeness, and I agree with the above.   Vanna Scotland, MD    [facility] [address] [place], [place] [address] [phone]

## 2023-07-24 ENCOUNTER — Encounter: Payer: Self-pay | Admitting: Internal Medicine

## 2023-08-15 ENCOUNTER — Other Ambulatory Visit: Payer: Self-pay

## 2023-08-15 DIAGNOSIS — J3089 Other allergic rhinitis: Secondary | ICD-10-CM

## 2023-08-15 MED ORDER — MONTELUKAST SODIUM 10 MG PO TABS
ORAL_TABLET | ORAL | 0 refills | Status: DC
Start: 2023-08-15 — End: 2023-11-04

## 2023-10-03 ENCOUNTER — Ambulatory Visit
Admission: RE | Admit: 2023-10-03 | Discharge: 2023-10-03 | Disposition: A | Discharge status: Home / Self Care | Payer: 59 | Source: Ambulatory Visit | Attending: Internal Medicine | Admitting: Internal Medicine

## 2023-10-03 DIAGNOSIS — Z1231 Encounter for screening mammogram for malignant neoplasm of breast: Secondary | ICD-10-CM | POA: Insufficient documentation

## 2023-11-02 ENCOUNTER — Other Ambulatory Visit: Payer: Self-pay | Admitting: Internal Medicine

## 2023-11-02 DIAGNOSIS — J3089 Other allergic rhinitis: Secondary | ICD-10-CM

## 2023-11-04 NOTE — Telephone Encounter (Signed)
Requested Prescriptions  Pending Prescriptions Disp Refills   montelukast (SINGULAIR) 10 MG tablet [Pharmacy Med Name: MONTELUKAST SOD 10 MG TABLET] 90 tablet 0    Sig: Take 1 tablet by mouth every night at bedtime     Pulmonology:  Leukotriene Inhibitors Passed - 11/02/2023  7:03 PM      Passed - Valid encounter within last 12 months    Recent Outpatient Visits           3 months ago Acute right flank pain   [place] [facility], Melton Alar, PA   4 months ago Annual physical exam   [facility] at [facility], Nyoka Cowden, MD   5 months ago Tinea corporis   [facility] & [facility] Medicine at [facility], Nyoka Cowden, MD   1 year ago Annual physical exam   [facility] Medicine at [facility], Nyoka Cowden, MD   2 years ago Left pontine stroke [facility])   [facility] Primary Care & Sports Medicine at [facility], Nyoka Cowden, MD       Future Appointments             In 5 months Vanna Scotland, MD [facility] [place]

## 2023-11-11 ENCOUNTER — Telehealth (INDEPENDENT_AMBULATORY_CARE_PROVIDER_SITE_OTHER): Payer: Self-pay | Admitting: Internal Medicine

## 2023-11-11 ENCOUNTER — Encounter: Payer: Self-pay | Admitting: Internal Medicine

## 2023-11-11 ENCOUNTER — Ambulatory Visit: Payer: Self-pay

## 2023-11-11 ENCOUNTER — Other Ambulatory Visit: Payer: Self-pay | Admitting: Internal Medicine

## 2023-11-11 VITALS — Ht 64.0 in

## 2023-11-11 DIAGNOSIS — J0100 Acute maxillary sinusitis, unspecified: Secondary | ICD-10-CM

## 2023-11-11 MED ORDER — AZITHROMYCIN 250 MG PO TABS
ORAL_TABLET | ORAL | 0 refills | Status: AC
Start: 2023-11-11 — End: 2023-11-16

## 2023-11-11 NOTE — Progress Notes (Signed)
Date:  11/11/2023   Name:  [name]   DOB:  [birth year]   MRN:  [id number]  This encounter was conducted via video encounter. This platform was deemed appropriate for the issues to be addressed.  The patient was correctly identified.  I advised that I am conducting the visit from a secure room in my office at [facility].  The patient is located at home. The limitations of this form of encounter were discussed with the patient and he/she agreed to proceed.  Some vital signs will be absent.  Chief Complaint: No chief complaint on file.  Sinusitis This is a new problem. Episode onset: X1.5 weeks. The problem is unchanged. There has been no fever. Her pain is at a severity of 4/10. The pain is mild. Associated symptoms include chills, congestion, coughing, headaches, shortness of breath, sinus pressure and sneezing. Past treatments include nasal decongestants and oral decongestants (delsym, sudafed). The treatment provided mild relief.    Review of Systems  Constitutional:  Positive for chills. Negative for fatigue and fever.  HENT:  Positive for congestion, sinus pressure and sneezing.   Respiratory:  Positive for cough and shortness of breath.   Cardiovascular:  Negative for chest pain and palpitations.  Neurological:  Positive for headaches. Negative for dizziness.     Lab Results  Component Value Date   NA 136 07/13/2023   K 3.1 (L) 07/13/2023   CO2 28 07/13/2023   GLUCOSE 97 07/13/2023   BUN 13 07/13/2023   CREATININE 0.89 07/13/2023   CALCIUM 8.5 (L) 07/13/2023   EGFR 69 06/24/2023   GFRNONAA >60 07/13/2023   Lab Results  Component Value Date   CHOL 154 06/24/2023   HDL 75 06/24/2023   LDLCALC 67 06/24/2023   TRIG 58 06/24/2023   CHOLHDL 2.1 06/24/2023   Lab Results  Component Value Date   TSH 1.420 06/24/2023   Lab Results  Component Value Date   HGBA1C 6.1 (H) 06/24/2023   Lab Results  Component Value Date   WBC 8.3 07/13/2023   HGB 13.1  07/13/2023   HCT 40.7 07/13/2023   MCV 88.9 07/13/2023   PLT 311 07/13/2023   Lab Results  Component Value Date   ALT 20 07/13/2023   AST 24 07/13/2023   ALKPHOS 61 07/13/2023   BILITOT 0.6 07/13/2023   Lab Results  Component Value Date   VD25OH 47.2 01/22/2022     Patient Active Problem List   Diagnosis Date Noted   Bilateral nephrolithiasis 07/12/2023   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   Strain of right rotator cuff capsule    Mild cognitive impairment 12/26/2020   Status post total hysterectomy and bilateral salpingo-oophorectomy 09/23/2020   Generalized osteoarthritis of hand 01/27/2017   Mood disorder (HCC) 07/13/2016   Prediabetes 11/07/2015   Mixed hyperlipidemia 11/07/2015   Low serum thyroid stimulating hormone (TSH) 09/30/2015   Osteoarthritis 09/29/2015   Obesity, Class I, BMI 30-34.9 09/26/2015   Vitamin D deficiency 09/26/2015   Migraine headache without aura 09/26/2015   Allergic asthma, mild intermittent, uncomplicated 09/26/2015   Colon polyp 09/26/2015   Synovial cyst of left knee 09/26/2015   Environmental and seasonal allergies 09/26/2015    Allergies  Allergen Reactions   Clavulanic Acid Diarrhea   Oxycodone Itching   Tape Rash    Past Surgical History:  Procedure Laterality Date   APPENDECTOMY     CESAREAN SECTION     x 1   COLONOSCOPY  2012  due in 2017 for suspicious lesion   COLONOSCOPY WITH PROPOFOL N/A 08/20/2016   Procedure: COLONOSCOPY WITH PROPOFOL;  Surgeon: Midge Minium, MD;  Location: [facility];  Service: Endoscopy;  Laterality: N/A;   REPAIR ANKLE LIGAMENT     from fall   ROTATOR CUFF REPAIR Left    shoulder   TONSILLECTOMY     TOTAL ABDOMINAL HYSTERECTOMY      Social History   Tobacco Use   Smoking status: Never   Smokeless tobacco: Never  Vaping Use   Vaping status: Never Used  Substance Use Topics   Alcohol use: Yes    Alcohol/week: 0.0 standard drinks of alcohol    Comment: 2 drinks - 2x/mo    Drug use: No     Medication list has been reviewed and updated.  Current Meds  Medication Sig   acetaminophen (TYLENOL) 650 MG CR tablet Take 650 mg by mouth 2 (two) times daily as needed for pain.   albuterol (PROVENTIL) (2.5 MG/3ML) 0.083% nebulizer solution Take 3 mLs (2.5 mg total) by nebulization every 6 (six) hours as needed for wheezing or shortness of breath.   aspirin EC 81 MG EC tablet Take 1 tablet (81 mg total) by mouth daily. Swallow whole.   atorvastatin (LIPITOR) 20 MG tablet Take 2 tablets (40 mg total) by mouth daily.   azithromycin (ZITHROMAX Z-PAK) 250 MG tablet UAD   busPIRone (BUSPAR) 5 MG tablet Take 1 tablet (5 mg total) by mouth 2 (two) times daily. (Patient taking differently: Take 5 mg by mouth daily.)   citalopram (CELEXA) 20 MG tablet Take 1 tablet (20 mg total) by mouth daily.   loratadine (CLARITIN) 10 MG tablet Take by mouth.   melatonin 5 MG TABS Take 5-10 mg by mouth at bedtime as needed (sleep).   montelukast (SINGULAIR) 10 MG tablet Take 1 tablet by mouth every night at bedtime   Multiple Vitamins-Minerals (MULTIVITAMIN ADULTS 50+ PO) Take 1 capsule by mouth daily.   nortriptyline (PAMELOR) 10 MG capsule SMARTSIG:1 Capsule(s) By Mouth Every Evening   rizatriptan (MAXALT) 10 MG tablet as needed.   Triamcinolone Acetonide (NASACORT ALLERGY 24HR NA) Place 1-2 sprays into both nostrils daily as needed (allergies).   Vitamin D, Cholecalciferol, 1000 UNITS CAPS Take 1,000 Units by mouth daily.       11/11/2023   10:31 AM 07/11/2023    3:34 PM 06/24/2023    8:23 AM 05/10/2023    4:11 PM  GAD 7 : Generalized Anxiety Score  Nervous, Anxious, on Edge 1 1 1 1   Control/stop worrying 1 1 1 1   Worry too much - different things 1 1 1 2   Trouble relaxing 0 1 1 2   Restless 0 1 1 2   Easily annoyed or irritable 0 1 1 2   Afraid - awful might happen 0 0 0 0  Total GAD 7 Score 3 6 6 10   Anxiety Difficulty Not difficult at all Somewhat difficult Somewhat difficult  Somewhat difficult       11/11/2023   10:30 AM 07/11/2023    3:33 PM 06/24/2023    8:23 AM  Depression screen PHQ 2/9  Decreased Interest 0 0 0  Down, Depressed, Hopeless 0 1 1  PHQ - 2 Score 0 1 1  Altered sleeping 0 1 1  Tired, decreased energy 1 1 1   Change in appetite 0 1 1  Feeling bad or failure about yourself  0 1 1  Trouble concentrating 0 1 1  Moving slowly  or fidgety/restless 0 1 1  Suicidal thoughts 0 0 0  PHQ-9 Score 1 7 7   Difficult doing work/chores Not difficult at all Somewhat difficult Somewhat difficult    BP Readings from Last 3 Encounters:  07/22/23 128/76  07/13/23 (!) 157/77  07/12/23 (!) 152/81    Physical Exam Constitutional:      Appearance: Normal appearance.  HENT:     Nose:     Right Sinus: Maxillary sinus tenderness present.     Left Sinus: Maxillary sinus tenderness present.  Pulmonary:     Comments: No cough or dyspnea noted during the call Neurological:     Mental Status: She is alert.  Psychiatric:        Attention and Perception: Attention normal.        Mood and Affect: Mood normal.        Speech: Speech normal.        Cognition and Memory: Cognition normal.     Wt Readings from Last 3 Encounters:  07/22/23 177 lb 6 oz (80.5 kg)  07/11/23 176 lb (79.8 kg)  06/24/23 179 lb 12.8 oz (81.6 kg)    Ht 5\' 4"  (1.626 m)   BMI 30.45 kg/m   Assessment and Plan:  Problem List Items Addressed This Visit   None Visit Diagnoses       Acute non-recurrent maxillary sinusitis    -  Primary   continue Nasocort, Sudafed bid, fluids add Zpack follow up if needed   Relevant Medications   azithromycin (ZITHROMAX Z-PAK) 250 MG tablet      I spent 6 minutes on this encounter, 100% by video.  No follow-ups on file.    Reubin Milan, MD [facility] [place]

## 2023-11-11 NOTE — Telephone Encounter (Signed)
  Chief Complaint: Sinus congestion pain under eyes and across nose Symptoms: above Frequency: Last Tuesday 11/01/2023 Pertinent Negatives: Patient denies fever Disposition: [] ED /[] Urgent Care (no appt availability in office) / [x] Appointment(In office/virtual)/ []  [place] [facility]/ [] Home Care/ [] Refused Recommended Disposition /[] [place] [facility]/ []  Follow-up with PCP Additional Notes: Pt had a chest cold with productive cough. Pt still has cough, but now also sinus pain and congestion. Pt believes she ahs a sinus infection and would like ABX for his. MyChart visit scheduled.     Summary: requesting antibiotics/sinus   Patient called and states she had a viral cold last week and now it's gone into her head and now in sinuses & requesting antibiotics. Patient has been taking 12 hour sudafed and Delsym. Patient has not checked temperature.   Please advise.  Patient's callback # [phone]       Reason for Disposition  [1] Sinus congestion (pressure, fullness) AND [2] present > 10 days  Answer Assessment - Initial Assessment Questions 1. LOCATION: "Where does it hurt?"      Yes - under eyes and across nose 2. ONSET: "When did the sinus pain start?"  (e.g., hours, days)      Last week - tuesday 3. SEVERITY: "How bad is the pain?"   (Scale 1-10; mild, moderate or severe)   - MILD (1-3): doesn't interfere with normal activities    - MODERATE (4-7): interferes with normal activities (e.g., work or school) or awakens from sleep   - SEVERE (8-10): excruciating pain and patient unable to do any normal activities        mild 4. RECURRENT SYMPTOM: "Have you ever had sinus problems before?" If Yes, ask: "When was the last time?" and "What happened that time?"      yes 5. NASAL CONGESTION: "Is the nose blocked?" If Yes, ask: "Can you open it or must you breathe through your mouth?"     yes 6. NASAL DISCHARGE: "Do you have discharge from your nose?" If so ask, "What color?"      yellow 7. FEVER: "Do you have a fever?" If Yes, ask: "What is it, how was it measured, and when did it start?"       8. OTHER SYMPTOMS: "Do you have any other symptoms?" (e.g., sore throat, cough, earache, difficulty breathing)     cough  Protocols used: Sinus Pain or Congestion-A-AH

## 2023-12-13 ENCOUNTER — Other Ambulatory Visit: Payer: Self-pay | Admitting: Internal Medicine

## 2023-12-13 DIAGNOSIS — E782 Mixed hyperlipidemia: Secondary | ICD-10-CM

## 2023-12-14 NOTE — Telephone Encounter (Signed)
 Requested Prescriptions  Pending Prescriptions Disp Refills   atorvastatin  (LIPITOR ) 20 MG tablet [Pharmacy Med Name: ATORVASTATIN  20 MG TABLET] 180 tablet 0    Sig: Take 2 tablets by mouth once daily     Cardiovascular:  Antilipid - Statins Failed - 12/14/2023  2:02 PM      Failed - Lipid Panel in normal range within the last 12 months    Cholesterol, Total  Date Value Ref Range Status  06/24/2023 154 100 - 199 mg/dL Final   LDL Chol Calc (NIH)  Date Value Ref Range Status  06/24/2023 67 0 - 99 mg/dL Final   HDL  Date Value Ref Range Status  06/24/2023 75 >39 mg/dL Final   Triglycerides  Date Value Ref Range Status  06/24/2023 58 0 - 149 mg/dL Final         Passed - Patient is not pregnant      Passed - Valid encounter within last 12 months    Recent Outpatient Visits           1 month ago Acute non-recurrent maxillary sinusitis   [place] [facility] at [facility], Chales [place], MD   5 months ago Acute right flank pain   [place] [facility] at [facility] [place] [facility], Arleen Lacer, PA   5 months ago Annual physical exam   [facility] at [facility], Chales [place], MD   7 months ago Tinea corporis   [facility] at [facility], Chales [place], MD   1 year ago Annual physical exam   [facility] at [facility], Chales [place], MD

## 2024-01-07 ENCOUNTER — Other Ambulatory Visit: Payer: Self-pay | Admitting: Internal Medicine

## 2024-01-09 NOTE — Telephone Encounter (Signed)
 Duplicate request- Rx 20mg  bid filled 12/14/23 #180 Requested Prescriptions  Pending Prescriptions Disp Refills   atorvastatin  (LIPITOR ) 40 MG tablet [Pharmacy Med Name: ATORVASTATIN  40 MG TABLET] 90 tablet     Sig: TAKE 1 TABLET (40 MG TOTAL) BY MOUTH DAILY.     Cardiovascular:  Antilipid - Statins Failed - 01/09/2024 12:05 PM      Failed - Lipid Panel in normal range within the last 12 months    Cholesterol, Total  Date Value Ref Range Status  06/24/2023 154 100 - 199 mg/dL Final   LDL Chol Calc (NIH)  Date Value Ref Range Status  06/24/2023 67 0 - 99 mg/dL Final   HDL  Date Value Ref Range Status  06/24/2023 75 >39 mg/dL Final   Triglycerides  Date Value Ref Range Status  06/24/2023 58 0 - 149 mg/dL Final         Passed - Patient is not pregnant      Passed - Valid encounter within last 12 months    Recent Outpatient Visits           1 month ago Acute non-recurrent maxillary sinusitis   [place] [facility] at [facility], Chales [place], MD   6 months ago Acute right flank pain   [place] [facility] at [facility] [place] [facility], Arleen Lacer, PA   6 months ago Annual physical exam   [facility] at [facility], Chales [place], MD   8 months ago Tinea corporis   [facility] at [facility], Chales [place], MD   2 years ago Annual physical exam   [facility] at [facility], Chales [place], MD

## 2024-02-07 ENCOUNTER — Other Ambulatory Visit: Payer: Self-pay | Admitting: Physician Assistant

## 2024-02-07 ENCOUNTER — Telehealth: Admitting: Physician Assistant

## 2024-02-07 DIAGNOSIS — J4520 Mild intermittent asthma, uncomplicated: Secondary | ICD-10-CM

## 2024-02-07 DIAGNOSIS — R109 Unspecified abdominal pain: Secondary | ICD-10-CM

## 2024-02-08 MED ORDER — ALBUTEROL SULFATE HFA 108 (90 BASE) MCG/ACT IN AERS
1.0000 | INHALATION_SPRAY | Freq: Four times a day (QID) | RESPIRATORY_TRACT | 0 refills | Status: AC | PRN
Start: 2024-02-08 — End: ?

## 2024-02-08 NOTE — Telephone Encounter (Signed)
 Dc'd 11/11/23 Dr Judithann Graves  Requested Prescriptions  Refused Prescriptions Disp Refills   tamsulosin (FLOMAX) 0.4 MG CAPS capsule [Pharmacy Med Name: TAMSULOSIN HCL 0.4 MG CAPSULE] 15 capsule 0    Sig: TAKE 1 CAPSULE BY MOUTH EVERY DAY     Urology: Alpha-Adrenergic Blocker Failed - 02/08/2024  1:43 PM      Failed - PSA in normal range and within 360 days    No results found for: "LABPSA", "PSA", "PSA1", "ULTRAPSA"       Passed - Last BP in normal range    BP Readings from Last 1 Encounters:  07/22/23 128/76         Passed - Valid encounter within last 12 months    Recent Outpatient Visits           2 months ago Acute non-recurrent maxillary sinusitis   [place] [facility] at [facility], Nyoka Cowden, MD   7 months ago Acute right flank pain   [place] [facility] at [facility] [place] [facility], Melton Alar, PA   7 months ago Annual physical exam   [facility] at [facility], Nyoka Cowden, MD   9 months ago Tinea corporis   [facility] at [facility], Nyoka Cowden, MD   2 years ago Annual physical exam   [facility] at [facility], Nyoka Cowden, MD       Future Appointments             In 6 days Judithann Graves Nyoka Cowden, MD [facility] at [facility], [facility]

## 2024-02-08 NOTE — Progress Notes (Signed)

## 2024-02-14 ENCOUNTER — Ambulatory Visit (INDEPENDENT_AMBULATORY_CARE_PROVIDER_SITE_OTHER): Payer: 59 | Admitting: Internal Medicine

## 2024-02-14 ENCOUNTER — Encounter: Payer: Self-pay | Admitting: Internal Medicine

## 2024-02-14 VITALS — BP 126/84 | HR 94 | Ht 64.0 in | Wt 183.4 lb

## 2024-02-14 DIAGNOSIS — Z23 Encounter for immunization: Secondary | ICD-10-CM

## 2024-02-14 DIAGNOSIS — G43019 Migraine without aura, intractable, without status migrainosus: Secondary | ICD-10-CM

## 2024-02-14 DIAGNOSIS — F39 Unspecified mood [affective] disorder: Secondary | ICD-10-CM

## 2024-02-14 DIAGNOSIS — M5416 Radiculopathy, lumbar region: Secondary | ICD-10-CM | POA: Insufficient documentation

## 2024-02-14 DIAGNOSIS — R7303 Prediabetes: Secondary | ICD-10-CM

## 2024-02-14 DIAGNOSIS — E782 Mixed hyperlipidemia: Secondary | ICD-10-CM

## 2024-02-14 DIAGNOSIS — J4520 Mild intermittent asthma, uncomplicated: Secondary | ICD-10-CM

## 2024-02-14 DIAGNOSIS — N3281 Overactive bladder: Secondary | ICD-10-CM | POA: Insufficient documentation

## 2024-02-14 MED ORDER — ALBUTEROL SULFATE (2.5 MG/3ML) 0.083% IN NEBU
2.5000 mg | INHALATION_SOLUTION | Freq: Four times a day (QID) | RESPIRATORY_TRACT | 1 refills | Status: AC | PRN
Start: 2024-02-14 — End: ?

## 2024-02-14 MED ORDER — CITALOPRAM HYDROBROMIDE 20 MG PO TABS
20.0000 mg | ORAL_TABLET | Freq: Every day | ORAL | 1 refills | Status: DC
Start: 2024-02-14 — End: 2024-08-13

## 2024-02-14 MED ORDER — OXYBUTYNIN CHLORIDE ER 5 MG PO TB24
5.0000 mg | ORAL_TABLET | Freq: Every day | ORAL | 0 refills | Status: DC
Start: 2024-02-14 — End: 2024-03-05

## 2024-02-14 NOTE — Assessment & Plan Note (Signed)
 LDL is  Lab Results  Component Value Date   LDLCALC 67 06/24/2023   Current regimen is atorvastatin.  Tolerating medications well without issues.

## 2024-02-14 NOTE — Assessment & Plan Note (Signed)
 Treated by Neurology On nortriptyline nightly and Maxalt prn No change in character or frequency of migraines

## 2024-02-14 NOTE — Assessment & Plan Note (Signed)
 Continue Singulair and albuterol nebs as needed.

## 2024-02-14 NOTE — Assessment & Plan Note (Signed)
 Controlled with diet Lab Results  Component Value Date   HGBA1C 6.1 (H) 06/24/2023

## 2024-02-14 NOTE — Assessment & Plan Note (Signed)
 Trial of Ditropan given

## 2024-02-14 NOTE — Assessment & Plan Note (Signed)
 Clinically stable on Celexa and Buspar with good response, No SI or HI reported. Some recent stress with husband's concussion. No change in management at this time.

## 2024-02-14 NOTE — Progress Notes (Signed)
 Date:  02/14/2024   Name:  Christabell Loseke   DOB:  [birth year]   MRN:  [id number]   Chief Complaint: Diabetes  Diabetes She presents for her follow-up diabetic visit. Diabetes type: prediabetes. Her disease course has been stable. Hypoglycemia symptoms include headaches. Pertinent negatives for hypoglycemia include no dizziness or nervousness/anxiousness. Pertinent negatives for diabetes include no chest pain and no fatigue. Current diabetic treatment includes diet.  Depression        This is a chronic problem.The problem is unchanged.  Associated symptoms include headaches.  Associated symptoms include no fatigue.  Past treatments include SSRIs - Selective serotonin reuptake inhibitors and other medications.  Compliance with treatment is good.  Previous treatment provided significant relief. Migraine  This is a recurrent problem. Pertinent negatives include no dizziness or fever. Treatments tried: on Nortriptyline for prevention and Maxalt for rescus.  Hyperlipidemia This is a chronic problem. The problem is controlled. Pertinent negatives include no chest pain or shortness of breath. Current antihyperlipidemic treatment includes statins.  Urinary Frequency  This is a new problem. The problem has been gradually worsening. The pain is at a severity of 0/10. The patient is experiencing no pain. There has been no fever. Associated symptoms include frequency and urgency. Pertinent negatives include no chills or hematuria.    Review of Systems  Constitutional:  Negative for chills, fatigue and fever.  HENT:  Negative for trouble swallowing.   Eyes:  Negative for visual disturbance.  Respiratory:  Negative for chest tightness and shortness of breath.   Cardiovascular:  Negative for chest pain and palpitations.  Genitourinary:  Positive for frequency and urgency. Negative for dysuria and hematuria.  Neurological:  Positive for headaches. Negative for dizziness.  Psychiatric/Behavioral:   Positive for depression. Negative for dysphoric mood and sleep disturbance. The patient is not nervous/anxious.      Lab Results  Component Value Date   NA 136 07/13/2023   K 3.1 (L) 07/13/2023   CO2 28 07/13/2023   GLUCOSE 97 07/13/2023   BUN 13 07/13/2023   CREATININE 0.89 07/13/2023   CALCIUM 8.5 (L) 07/13/2023   EGFR 69 06/24/2023   GFRNONAA >60 07/13/2023   Lab Results  Component Value Date   CHOL 154 06/24/2023   HDL 75 06/24/2023   LDLCALC 67 06/24/2023   TRIG 58 06/24/2023   CHOLHDL 2.1 06/24/2023   Lab Results  Component Value Date   TSH 1.420 06/24/2023   Lab Results  Component Value Date   HGBA1C 6.1 (H) 06/24/2023   Lab Results  Component Value Date   WBC 8.3 07/13/2023   HGB 13.1 07/13/2023   HCT 40.7 07/13/2023   MCV 88.9 07/13/2023   PLT 311 07/13/2023   Lab Results  Component Value Date   ALT 20 07/13/2023   AST 24 07/13/2023   ALKPHOS 61 07/13/2023   BILITOT 0.6 07/13/2023   Lab Results  Component Value Date   VD25OH 47.2 01/22/2022     Patient Active Problem List   Diagnosis Date Noted   Lumbar radiculopathy, chronic 02/14/2024   OAB (overactive bladder) 02/14/2024   Bilateral nephrolithiasis 07/12/2023   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   Strain of right rotator cuff capsule    Mild cognitive impairment 12/26/2020   Status post total hysterectomy and bilateral salpingo-oophorectomy 09/23/2020   Generalized osteoarthritis of hand 01/27/2017   Mood disorder (HCC) 07/13/2016   Prediabetes 11/07/2015   Mixed hyperlipidemia 11/07/2015   Low serum thyroid stimulating hormone (TSH) 09/30/2015  Osteoarthritis 09/29/2015   Obesity, Class I, BMI 30-34.9 09/26/2015   Vitamin D deficiency 09/26/2015   Migraine headache without aura 09/26/2015   Mild intermittent asthma without complication 09/26/2015   Colon polyp 09/26/2015   Synovial cyst of left knee 09/26/2015   Environmental and seasonal allergies 09/26/2015    Allergies   Allergen Reactions   Clavulanic Acid Diarrhea   Oxycodone Itching   Tape Rash    Past Surgical History:  Procedure Laterality Date   APPENDECTOMY     CESAREAN SECTION     x 1   COLONOSCOPY  2012   due in 2017 for suspicious lesion   COLONOSCOPY WITH PROPOFOL N/A 08/20/2016   Procedure: COLONOSCOPY WITH PROPOFOL;  Surgeon: Midge Minium, MD;  Location: [facility];  Service: Endoscopy;  Laterality: N/A;   REPAIR ANKLE LIGAMENT     from fall   ROTATOR CUFF REPAIR Left    shoulder   TONSILLECTOMY     TOTAL ABDOMINAL HYSTERECTOMY      Social History   Tobacco Use   Smoking status: Never   Smokeless tobacco: Never  Vaping Use   Vaping status: Never Used  Substance Use Topics   Alcohol use: Yes    Alcohol/week: 0.0 standard drinks of alcohol    Comment: 2 drinks - 2x/mo   Drug use: No     Medication list has been reviewed and updated.  Current Meds  Medication Sig   acetaminophen (TYLENOL) 650 MG CR tablet Take 650 mg by mouth 2 (two) times daily as needed for pain.   albuterol (VENTOLIN HFA) 108 (90 Base) MCG/ACT inhaler Inhale 1-2 puffs into the lungs every 6 (six) hours as needed.   aspirin EC 81 MG EC tablet Take 1 tablet (81 mg total) by mouth daily. Swallow whole.   atorvastatin (LIPITOR) 40 MG tablet Take 40 mg by mouth daily.   busPIRone (BUSPAR) 10 MG tablet Take 10 mg by mouth 2 (two) times daily.   cetirizine (ZYRTEC) 10 MG tablet Take by mouth.   melatonin 5 MG TABS Take 5-10 mg by mouth at bedtime as needed (sleep).   montelukast (SINGULAIR) 10 MG tablet Take 1 tablet by mouth every night at bedtime   Multiple Vitamins-Minerals (MULTIVITAMIN ADULTS 50+ PO) Take 1 capsule by mouth daily.   nortriptyline (PAMELOR) 10 MG capsule SMARTSIG:1 Capsule(s) By Mouth Every Evening   oxybutynin (DITROPAN XL) 5 MG 24 hr tablet Take 1 tablet (5 mg total) by mouth at bedtime.   rizatriptan (MAXALT) 10 MG tablet as needed.   Triamcinolone Acetonide (NASACORT  ALLERGY 24HR NA) Place 1-2 sprays into both nostrils daily as needed (allergies).   Vitamin D, Cholecalciferol, 1000 UNITS CAPS Take 1,000 Units by mouth daily.   [DISCONTINUED] albuterol (PROVENTIL) (2.5 MG/3ML) 0.083% nebulizer solution Take 3 mLs (2.5 mg total) by nebulization every 6 (six) hours as needed for wheezing or shortness of breath.   [DISCONTINUED] citalopram (CELEXA) 20 MG tablet Take 1 tablet (20 mg total) by mouth daily.       11/11/2023   10:31 AM 07/11/2023    3:34 PM 06/24/2023    8:23 AM 05/10/2023    4:11 PM  GAD 7 : Generalized Anxiety Score  Nervous, Anxious, on Edge 1 1 1 1   Control/stop worrying 1 1 1 1   Worry too much - different things 1 1 1 2   Trouble relaxing 0 1 1 2   Restless 0 1 1 2   Easily annoyed or irritable 0 1  1 2  Afraid - awful might happen 0 0 0 0  Total GAD 7 Score 3 6 6 10   Anxiety Difficulty Not difficult at all Somewhat difficult Somewhat difficult Somewhat difficult       11/11/2023   10:30 AM 07/11/2023    3:33 PM 06/24/2023    8:23 AM  Depression screen PHQ 2/9  Decreased Interest 0 0 0  Down, Depressed, Hopeless 0 1 1  PHQ - 2 Score 0 1 1  Altered sleeping 0 1 1  Tired, decreased energy 1 1 1   Change in appetite 0 1 1  Feeling bad or failure about yourself  0 1 1  Trouble concentrating 0 1 1  Moving slowly or fidgety/restless 0 1 1  Suicidal thoughts 0 0 0  PHQ-9 Score 1 7 7   Difficult doing work/chores Not difficult at all Somewhat difficult Somewhat difficult    BP Readings from Last 3 Encounters:  02/14/24 126/84  07/22/23 128/76  07/13/23 (!) 157/77    Physical Exam Vitals and nursing note reviewed.  Constitutional:      General: She is not in acute distress.    Appearance: Normal appearance. She is well-developed.  HENT:     Head: Normocephalic and atraumatic.  Neck:     Vascular: No carotid bruit.  Cardiovascular:     Rate and Rhythm: Normal rate and regular rhythm.  Pulmonary:     Effort: Pulmonary effort  is normal. No respiratory distress.     Breath sounds: No wheezing or rhonchi.  Musculoskeletal:     Cervical back: Normal range of motion.     Right lower leg: No edema.     Left lower leg: No edema.  Lymphadenopathy:     Cervical: No cervical adenopathy.  Skin:    General: Skin is warm and dry.     Findings: No rash.  Neurological:     Mental Status: She is alert and oriented to person, place, and time. Mental status is at baseline.  Psychiatric:        Mood and Affect: Mood normal.        Behavior: Behavior normal.     Wt Readings from Last 3 Encounters:  02/14/24 183 lb 6 oz (83.2 kg)  07/22/23 177 lb 6 oz (80.5 kg)  07/11/23 176 lb (79.8 kg)    BP 126/84   Pulse 94   Ht 5\' 4"  (1.626 m)   Wt 183 lb 6 oz (83.2 kg)   SpO2 94%   BMI 31.48 kg/m   Assessment and Plan:  Problem List Items Addressed This Visit       Unprioritized   Migraine headache without aura   Treated by Neurology On nortriptyline nightly and Maxalt prn No change in character or frequency of migraines      Relevant Medications   citalopram (CELEXA) 20 MG tablet   Mild intermittent asthma without complication   Continue Singulair and albuterol nebs as needed.      Relevant Medications   albuterol (PROVENTIL) (2.5 MG/3ML) 0.083% nebulizer solution   Other Relevant Orders   CBC with Differential/Platelet   Prediabetes - Primary   Controlled with diet Lab Results  Component Value Date   HGBA1C 6.1 (H) 06/24/2023         Relevant Orders   Comprehensive metabolic panel   Hemoglobin A1c   Mixed hyperlipidemia   LDL is  Lab Results  Component Value Date   LDLCALC 67 06/24/2023   Current regimen is  atorvastatin.  Tolerating medications well without issues.       Mood disorder (HCC)   Clinically stable on Celexa and Buspar with good response, No SI or HI reported. Some recent stress with husband's concussion. No change in management at this time.       Relevant Medications    citalopram (CELEXA) 20 MG tablet   Other Relevant Orders   TSH   OAB (overactive bladder)   Trial of Ditropan given      Relevant Medications   oxybutynin (DITROPAN XL) 5 MG 24 hr tablet   Other Visit Diagnoses       Immunization due       Relevant Orders   Varicella-zoster vaccine IM (Completed)       No follow-ups on file.    Reubin Milan, MD [facility] [place]

## 2024-02-15 ENCOUNTER — Encounter: Payer: Self-pay | Admitting: Internal Medicine

## 2024-02-15 LAB — CBC WITH DIFFERENTIAL/PLATELET
Basophils Absolute: 0 10*3/uL (ref 0.0–0.2)
Basos: 0 %
EOS (ABSOLUTE): 0.2 10*3/uL (ref 0.0–0.4)
Eos: 3 %
Hematocrit: 40.5 % (ref 34.0–46.6)
Hemoglobin: 13.3 g/dL (ref 11.1–15.9)
Immature Grans (Abs): 0 10*3/uL (ref 0.0–0.1)
Immature Granulocytes: 0 %
Lymphocytes Absolute: 2 10*3/uL (ref 0.7–3.1)
Lymphs: 29 %
MCH: 29.8 pg (ref 26.6–33.0)
MCHC: 32.8 g/dL (ref 31.5–35.7)
MCV: 91 fL (ref 79–97)
Monocytes Absolute: 0.5 10*3/uL (ref 0.1–0.9)
Monocytes: 7 %
Neutrophils Absolute: 4.2 10*3/uL (ref 1.4–7.0)
Neutrophils: 61 %
Platelets: 323 10*3/uL (ref 150–450)
RBC: 4.47 x10E6/uL (ref 3.77–5.28)
RDW: 13.1 % (ref 11.7–15.4)
WBC: 6.9 10*3/uL (ref 3.4–10.8)

## 2024-02-15 LAB — COMPREHENSIVE METABOLIC PANEL
ALT: 21 IU/L (ref 0–32)
AST: 19 IU/L (ref 0–40)
Albumin: 4.2 g/dL (ref 3.9–4.9)
Alkaline Phosphatase: 108 IU/L (ref 44–121)
BUN/Creatinine Ratio: 16 (ref 12–28)
BUN: 16 mg/dL (ref 8–27)
Bilirubin Total: 0.3 mg/dL (ref 0.0–1.2)
CO2: 29 mmol/L (ref 20–29)
Calcium: 9.1 mg/dL (ref 8.7–10.3)
Chloride: 101 mmol/L (ref 96–106)
Creatinine, Ser: 1.02 mg/dL — ABNORMAL HIGH (ref 0.57–1.00)
Globulin, Total: 2.1 g/dL (ref 1.5–4.5)
Glucose: 91 mg/dL (ref 70–99)
Potassium: 3.6 mmol/L (ref 3.5–5.2)
Sodium: 142 mmol/L (ref 134–144)
Total Protein: 6.3 g/dL (ref 6.0–8.5)
eGFR: 61 mL/min/{1.73_m2} (ref >59)

## 2024-02-15 LAB — HEMOGLOBIN A1C
Est. average glucose Bld gHb Est-mCnc: 134 mg/dL
Hgb A1c MFr Bld: 6.3 % — ABNORMAL HIGH (ref 4.8–5.6)

## 2024-02-15 LAB — TSH: TSH: 1.45 u[IU]/mL (ref 0.450–4.500)

## 2024-02-22 ENCOUNTER — Other Ambulatory Visit: Payer: Self-pay | Admitting: Internal Medicine

## 2024-02-23 NOTE — Telephone Encounter (Signed)
 Med refill

## 2024-02-23 NOTE — Telephone Encounter (Signed)
 Requested medications are due for refill today.  unsure  Requested medications are on the active medications list.  yes  Last refill. 02/14/2024  Future visit scheduled.   no  Notes to clinic.  Medication is historical.    Requested Prescriptions  Pending Prescriptions Disp Refills   atorvastatin (LIPITOR) 40 MG tablet [Pharmacy Med Name: ATORVASTATIN 40 MG TABLET] 90 tablet     Sig: TAKE 1 TABLET (40 MG TOTAL) BY MOUTH DAILY.     Cardiovascular:  Antilipid - Statins Failed - 02/23/2024  2:01 PM      Failed - Lipid Panel in normal range within the last 12 months    Cholesterol, Total  Date Value Ref Range Status  06/24/2023 154 100 - 199 mg/dL Final   LDL Chol Calc (NIH)  Date Value Ref Range Status  06/24/2023 67 0 - 99 mg/dL Final   HDL  Date Value Ref Range Status  06/24/2023 75 >39 mg/dL Final   Triglycerides  Date Value Ref Range Status  06/24/2023 58 0 - 149 mg/dL Final         Passed - Patient is not pregnant      Passed - Valid encounter within last 12 months    Recent Outpatient Visits           1 week ago Prediabetes   [facility] at [facility], Nyoka Cowden, MD

## 2024-03-05 ENCOUNTER — Other Ambulatory Visit: Payer: Self-pay | Admitting: Internal Medicine

## 2024-03-05 DIAGNOSIS — N3281 Overactive bladder: Secondary | ICD-10-CM

## 2024-03-05 MED ORDER — OXYBUTYNIN CHLORIDE ER 5 MG PO TB24: 5.0000 mg | ORAL_TABLET | Freq: Every day | ORAL | 5 refills | Status: DC

## 2024-03-05 NOTE — Telephone Encounter (Signed)
 Please review.  KP

## 2024-04-16 ENCOUNTER — Encounter: Payer: Self-pay | Admitting: Internal Medicine

## 2024-04-16 NOTE — Telephone Encounter (Signed)
 Please review and advise.

## 2024-04-20 ENCOUNTER — Ambulatory Visit: Payer: PRIVATE HEALTH INSURANCE | Admitting: Urology

## 2024-04-24 ENCOUNTER — Other Ambulatory Visit: Payer: Self-pay | Admitting: Internal Medicine

## 2024-04-27 ENCOUNTER — Ambulatory Visit: Payer: PRIVATE HEALTH INSURANCE | Admitting: Urology

## 2024-04-27 NOTE — Telephone Encounter (Signed)
 Requested Prescriptions  Refused Prescriptions Disp Refills   atorvastatin  (LIPITOR ) 20 MG tablet [Pharmacy Med Name: ATORVASTATIN  20 MG TABLET] 60 tablet 0    Sig: Take 2 tablets by mouth once daily     Cardiovascular:  Antilipid - Statins Failed - 04/27/2024  8:37 AM      Failed - Lipid Panel in normal range within the last 12 months    Cholesterol, Total  Date Value Ref Range Status  06/24/2023 154 100 - 199 mg/dL Final   LDL Chol Calc (NIH)  Date Value Ref Range Status  06/24/2023 67 0 - 99 mg/dL Final   HDL  Date Value Ref Range Status  06/24/2023 75 >39 mg/dL Final   Triglycerides  Date Value Ref Range Status  06/24/2023 58 0 - 149 mg/dL Final         Passed - Patient is not pregnant      Passed - Valid encounter within last 12 months    Recent Outpatient Visits           2 months ago Prediabetes   [facility] at [facility], Chales Colorado, MD

## 2024-07-19 ENCOUNTER — Other Ambulatory Visit: Payer: Self-pay | Admitting: Internal Medicine

## 2024-07-19 DIAGNOSIS — J3089 Other allergic rhinitis: Secondary | ICD-10-CM

## 2024-07-20 NOTE — Telephone Encounter (Signed)
 Requested Prescriptions  Pending Prescriptions Disp Refills   montelukast  (SINGULAIR ) 10 MG tablet [Pharmacy Med Name: MONTELUKAST  SOD 10 MG TABLET] 90 tablet 0    Sig: Take 1 tablet by mouth every night at bedtime     Pulmonology:  Leukotriene Inhibitors Passed - 07/20/2024  1:38 PM      Passed - Valid encounter within last 12 months    Recent Outpatient Visits           5 months ago Prediabetes   [facility] at [facility], Leita DEL, MD

## 2024-08-12 ENCOUNTER — Other Ambulatory Visit: Payer: Self-pay | Admitting: Internal Medicine

## 2024-08-12 DIAGNOSIS — F39 Unspecified mood [affective] disorder: Secondary | ICD-10-CM

## 2024-08-13 ENCOUNTER — Other Ambulatory Visit: Payer: Self-pay

## 2024-09-19 ENCOUNTER — Ambulatory Visit: Admitting: Internal Medicine

## 2024-09-19 ENCOUNTER — Ambulatory Visit: Payer: Self-pay

## 2024-09-19 NOTE — Telephone Encounter (Signed)
 Noted  Pt has appt.  KP

## 2024-09-19 NOTE — Telephone Encounter (Signed)
 FYI Only or Action Required?: FYI only for provider.  Patient was last seen in primary care on 02/14/2024 by Justus Leita DEL, MD.  Called Nurse Triage reporting Knee Pain.  Symptoms began several years ago.  Interventions attempted: Nothing.  Symptoms are: gradually worsening.  Triage Disposition: See PCP When Office is Open (Within 3 Days)  Patient/caregiver understands and will follow disposition?: Yes       Copied from CRM [id number]. Topic: Clinical - Red Word Triage >> Sep 19, 2024 10:40 AM Hadassah PARAS wrote: Red Word that prompted transfer to Nurse Triage:  Problem with left knee, Cyst on knee, fluid, hurts everyday . Would like to see if PCP would recommened a referall     ----------------------------------------------------------------------- From previous Reason for Contact - Scheduling: Patient/patient representative is calling to schedule an appointment. Refer to attachments for appointment information. Reason for Disposition  [1] MODERATE pain (e.g., interferes with normal activities, limping) AND [2] present > 3 days  Answer Assessment - Initial Assessment Questions Nickle sized fluid filled cyst that has been there for several years and was told to just leave it alone until it became a problem., several years, now hurting intermittently daily   1. LOCATION and RADIATION: Where is the pain located?      Left knee left side of knee  2. QUALITY: What does the pain feel like?  (e.g., sharp, dull, aching, burning)     Aches, some times sharp 3. SEVERITY: How bad is the pain? What does it keep you from doing?   (Scale 1-10; or mild, moderate, severe)     5 4. ONSET: When did the pain start? Does it come and go, or is it there all the time?     Years,  every day for the past month.  5. RECURRENT: Have you had this pain before? If Yes, ask: When, and what happened then?     yes 6. SETTING: Has there been any recent work, exercise or other activity  that involved that part of the body?      No change 7. AGGRAVATING FACTORS: What makes the knee pain worse? (e.g., walking, climbing stairs, running)     no 8. ASSOCIATED SYMPTOMS: Is there any swelling or redness of the knee?     no 9. OTHER SYMPTOMS: Do you have any other symptoms? (e.g., calf pain, chest pain, difficulty breathing, fever)     no  Protocols used: Knee Pain-A-AH

## 2024-09-20 ENCOUNTER — Ambulatory Visit (INDEPENDENT_AMBULATORY_CARE_PROVIDER_SITE_OTHER): Admitting: Internal Medicine

## 2024-09-20 ENCOUNTER — Encounter: Payer: Self-pay | Admitting: Internal Medicine

## 2024-09-20 VITALS — BP 122/70 | HR 86 | Ht 64.0 in | Wt 181.0 lb

## 2024-09-20 DIAGNOSIS — M7122 Synovial cyst of popliteal space [Baker], left knee: Secondary | ICD-10-CM

## 2024-09-20 NOTE — Progress Notes (Signed)
 Date:  09/20/2024   Name:  [name]   DOB:  09/13/1961   MRN:  [id number]   Chief Complaint: Cyst (X 4-5 years,left Knee, was getting bigger, has went down some, was the size of a nickel, painful, waking her up at night  /)  Knee Pain  There was no injury mechanism. The pain is present in the left knee. The quality of the pain is described as aching. The pain is moderate (worse when pressing on that area).  The cyst has been present for a number of years but now causing discomfort when sleeping on the left side.  She was seen years ago by Ortho and recommend watchful waiting.  She is now ready to have this addressed definitively.  Review of Systems  Constitutional:  Negative for chills and fever.  Respiratory:  Negative for chest tightness and shortness of breath.   Cardiovascular:  Negative for chest pain and leg swelling.  Musculoskeletal:  Positive for arthralgias (lateral left knee cyst).     Lab Results  Component Value Date   NA 142 02/14/2024   K 3.6 02/14/2024   CO2 29 02/14/2024   GLUCOSE 91 02/14/2024   BUN 16 02/14/2024   CREATININE 1.02 (H) 02/14/2024   CALCIUM  9.1 02/14/2024   EGFR 61 02/14/2024   GFRNONAA >60 07/13/2023   Lab Results  Component Value Date   CHOL 154 06/24/2023   HDL 75 06/24/2023   LDLCALC 67 06/24/2023   TRIG 58 06/24/2023   CHOLHDL 2.1 06/24/2023   Lab Results  Component Value Date   TSH 1.450 02/14/2024   Lab Results  Component Value Date   HGBA1C 6.3 (H) 02/14/2024   Lab Results  Component Value Date   WBC 6.9 02/14/2024   HGB 13.3 02/14/2024   HCT 40.5 02/14/2024   MCV 91 02/14/2024   PLT 323 02/14/2024   Lab Results  Component Value Date   ALT 21 02/14/2024   AST 19 02/14/2024   ALKPHOS 108 02/14/2024   BILITOT 0.3 02/14/2024   Lab Results  Component Value Date   VD25OH 47.2 01/22/2022     Patient Active Problem List   Diagnosis Date Noted   Lumbar radiculopathy, chronic 02/14/2024   OAB (overactive  bladder) 02/14/2024   Bilateral nephrolithiasis 07/12/2023   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   Strain of right rotator cuff capsule    Mild cognitive impairment 12/26/2020   Status post total hysterectomy and bilateral salpingo-oophorectomy 09/23/2020   Generalized osteoarthritis of hand 01/27/2017   Mood disorder 07/13/2016   Prediabetes 11/07/2015   Mixed hyperlipidemia 11/07/2015   Low serum thyroid stimulating hormone (TSH) 09/30/2015   Osteoarthritis 09/29/2015   Obesity, Class I, BMI 30-34.9 09/26/2015   Vitamin D  deficiency 09/26/2015   Migraine headache without aura 09/26/2015   Mild intermittent asthma without complication 09/26/2015   Colon polyp 09/26/2015   Synovial cyst of left knee 09/26/2015   Environmental and seasonal allergies 09/26/2015    Allergies  Allergen Reactions   Clavulanic Acid Diarrhea   Oxycodone  Itching   Tape Rash    Past Surgical History:  Procedure Laterality Date   APPENDECTOMY  2004   With hysterectomy   CESAREAN SECTION     x 1   COLONOSCOPY  2012   due in 2017 for suspicious lesion   COLONOSCOPY WITH PROPOFOL  N/A 08/20/2016   Procedure: COLONOSCOPY WITH PROPOFOL ;  Surgeon: Rogelia Copping, MD;  Location: [facility];  Service: Endoscopy;  Laterality: N/A;  REPAIR ANKLE LIGAMENT     from fall   ROTATOR CUFF REPAIR Left    shoulder   TONSILLECTOMY     TOTAL ABDOMINAL HYSTERECTOMY      Social History   Tobacco Use   Smoking status: Never   Smokeless tobacco: Never  Vaping Use   Vaping status: Never Used  Substance Use Topics   Alcohol use: Yes    Alcohol/week: 0.0 standard drinks of alcohol    Comment: 2 drinks - 2x/mo   Drug use: No     Medication list has been reviewed and updated.  Current Meds  Medication Sig   acetaminophen  (TYLENOL ) 650 MG CR tablet Take 650 mg by mouth 2 (two) times daily as needed for pain.   albuterol  (PROVENTIL ) (2.5 MG/3ML) 0.083% nebulizer solution Take 3 mLs (2.5 mg total) by  nebulization every 6 (six) hours as needed for wheezing or shortness of breath.   albuterol  (VENTOLIN  HFA) 108 (90 Base) MCG/ACT inhaler Inhale 1-2 puffs into the lungs every 6 (six) hours as needed.   aspirin  EC 81 MG EC tablet Take 1 tablet (81 mg total) by mouth daily. Swallow whole.   atorvastatin  (LIPITOR ) 40 MG tablet TAKE 1 TABLET (40 MG TOTAL) BY MOUTH DAILY.   busPIRone  (BUSPAR ) 10 MG tablet Take 10 mg by mouth 2 (two) times daily.   cetirizine (ZYRTEC) 10 MG tablet Take by mouth.   citalopram  (CELEXA ) 20 MG tablet Take 1 tablet by mouth once daily   melatonin 5 MG TABS Take 5-10 mg by mouth at bedtime as needed (sleep).   montelukast  (SINGULAIR ) 10 MG tablet Take 1 tablet by mouth every night at bedtime   Multiple Vitamins-Minerals (MULTIVITAMIN ADULTS 50+ PO) Take 1 capsule by mouth daily.   nortriptyline  (PAMELOR ) 10 MG capsule SMARTSIG:1 Capsule(s) By Mouth Every Evening   oxybutynin  (DITROPAN  XL) 5 MG 24 hr tablet Take 1 tablet (5 mg total) by mouth at bedtime.   rizatriptan  (MAXALT ) 10 MG tablet as needed.   Triamcinolone  Acetonide (NASACORT  ALLERGY 24HR NA) Place 1-2 sprays into both nostrils daily as needed (allergies).   Vitamin D , Cholecalciferol , 1000 UNITS CAPS Take 1,000 Units by mouth daily.       09/20/2024   11:32 AM 11/11/2023   10:31 AM 07/11/2023    3:34 PM 06/24/2023    8:23 AM  GAD 7 : Generalized Anxiety Score  Nervous, Anxious, on Edge 1 1 1 1   Control/stop worrying 1 1 1 1   Worry too much - different things 1 1 1 1   Trouble relaxing 0 0 1 1  Restless 0 0 1 1  Easily annoyed or irritable 0 0 1 1  Afraid - awful might happen 0 0 0 0  Total GAD 7 Score 3 3 6 6   Anxiety Difficulty Not difficult at all Not difficult at all Somewhat difficult Somewhat difficult       09/20/2024   11:32 AM 11/11/2023   10:30 AM 07/11/2023    3:33 PM  Depression screen PHQ 2/9  Decreased Interest 0 0 0  Down, Depressed, Hopeless 0 0 1  PHQ - 2 Score 0 0 1  Altered  sleeping  0 1  Tired, decreased energy  1 1  Change in appetite  0 1  Feeling bad or failure about yourself   0 1  Trouble concentrating  0 1  Moving slowly or fidgety/restless  0 1  Suicidal thoughts  0 0  PHQ-9 Score  1 7  Difficult doing work/chores  Not difficult at all Somewhat difficult    BP Readings from Last 3 Encounters:  09/20/24 122/70  02/14/24 126/84  07/22/23 128/76    Physical Exam Vitals and nursing note reviewed.  Constitutional:      General: She is not in acute distress.    Appearance: Normal appearance. She is well-developed.  HENT:     Head: Normocephalic and atraumatic.  Cardiovascular:     Rate and Rhythm: Normal rate and regular rhythm.  Pulmonary:     Effort: Pulmonary effort is normal. No respiratory distress.  Musculoskeletal:     Left knee: Swelling present. No erythema or ecchymosis. Normal range of motion. Tenderness present.       Legs:     Comments: 1 cm soft slightly mobile minimally tender cyst  Skin:    General: Skin is warm and dry.     Findings: No rash.  Neurological:     Mental Status: She is alert and oriented to person, place, and time.  Psychiatric:        Mood and Affect: Mood normal.        Behavior: Behavior normal.     Wt Readings from Last 3 Encounters:  09/20/24 181 lb (82.1 kg)  02/14/24 183 lb 6 oz (83.2 kg)  07/22/23 177 lb 6 oz (80.5 kg)    BP 122/70   Pulse 86   Ht 5' 4 (1.626 m)   Wt 181 lb (82.1 kg)   SpO2 99%   BMI 31.07 kg/m   Assessment and Plan:  Problem List Items Addressed This Visit       Unprioritized   Synovial cyst of left knee - Primary   Now causing more discomfort and interrupted sleep. Will refer to Ortho for evaluation and treatment.      Relevant Orders   Ambulatory referral to Orthopedic Surgery    No follow-ups on file.    Leita HILARIO Adie, MD [facility] [place]

## 2024-09-20 NOTE — Assessment & Plan Note (Signed)
 Now causing more discomfort and interrupted sleep. Will refer to Ortho for evaluation and treatment.

## 2024-09-20 NOTE — Progress Notes (Deleted)
 Date:  09/20/2024   Name:  [name]   DOB:  11-06-1961   MRN:  [id number]   Chief Complaint: No chief complaint on file.  HPI    Medication list has been reviewed and updated.  No outpatient medications have been marked as taking for the 09/20/24 encounter (Appointment) with Justus Leita DEL, MD.     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   Strain of right rotator cuff capsule    Mild cognitive impairment 12/26/2020   Status post total hysterectomy and bilateral salpingo-oophorectomy 09/23/2020   Generalized osteoarthritis of hand 01/27/2017   Mood disorder 07/13/2016   Prediabetes 11/07/2015   Mixed hyperlipidemia 11/07/2015   Low serum thyroid stimulating hormone (TSH) 09/30/2015   Osteoarthritis 09/29/2015   Obesity, Class I, BMI 30-34.9 09/26/2015   Vitamin D  deficiency 09/26/2015   Migraine headache without aura 09/26/2015   Mild intermittent asthma without complication 09/26/2015   Colon polyp 09/26/2015   Synovial cyst of left knee 09/26/2015   Environmental and seasonal allergies 09/26/2015    Allergies  Allergen Reactions   Clavulanic Acid Diarrhea   Oxycodone  Itching   Tape Rash    Immunization History  Administered Date(s) Administered   Influenza, Quadrivalent, Recombinant, Inj, Pf 07/16/2020   Influenza,inj,Quad PF,6+ Mos 07/22/2017, 08/08/2019   Influenza-Unspecified 09/03/2015, 09/05/2016, 07/23/2020, 10/06/2021   Moderna Sars-Covid-2 Vaccination 11/02/2020   PFIZER(Purple Top)SARS-COV-2 Vaccination 02/01/2020, 02/27/2020   PPD Test 07/16/2020   Tdap 08/08/2019   Zoster Recombinant(Shingrix ) 06/24/2023, 02/14/2024   Zoster, Live 03/14/2011    Past Surgical History:  Procedure Laterality Date   APPENDECTOMY     CESAREAN SECTION     x 1    COLONOSCOPY  2012   due in 2017 for suspicious lesion   COLONOSCOPY WITH PROPOFOL  N/A 08/20/2016   Procedure: COLONOSCOPY WITH PROPOFOL ;  Surgeon: Rogelia Copping, MD;  Location: [facility];  Service: Endoscopy;  Laterality: N/A;   REPAIR ANKLE LIGAMENT     from fall   ROTATOR CUFF REPAIR Left    shoulder   TONSILLECTOMY     TOTAL ABDOMINAL HYSTERECTOMY      Social History   Tobacco Use   Smoking status: Never   Smokeless tobacco: Never  Vaping Use   Vaping status: Never Used  Substance Use Topics   Alcohol use: Yes    Alcohol/week: 0.0 standard drinks of alcohol    Comment: 2 drinks - 2x/mo   Drug use: No    Family History  Problem Relation Age of Onset   Diabetes Paternal Uncle    Breast cancer Paternal Grandmother 71   CAD Father         11/11/2023   10:31 AM 07/11/2023    3:34 PM 06/24/2023    8:23 AM 05/10/2023    4:11 PM  GAD 7 : Generalized Anxiety Score  Nervous, Anxious, on Edge 1 1 1 1   Control/stop worrying 1 1 1 1   Worry too much - different things 1 1 1 2   Trouble relaxing 0 1 1 2   Restless 0 1 1 2   Easily annoyed or irritable 0 1 1 2   Afraid - awful might happen 0 0 0 0  Total GAD 7 Score 3 6 6 10   Anxiety Difficulty Not difficult at all Somewhat difficult Somewhat difficult Somewhat difficult  11/11/2023   10:30 AM 07/11/2023    3:33 PM 06/24/2023    8:23 AM  Depression screen PHQ 2/9  Decreased Interest 0 0 0  Down, Depressed, Hopeless 0 1 1  PHQ - 2 Score 0 1 1  Altered sleeping 0 1 1  Tired, decreased energy 1 1 1   Change in appetite 0 1 1  Feeling bad or failure about yourself  0 1 1  Trouble concentrating 0 1 1  Moving slowly or fidgety/restless 0 1 1  Suicidal thoughts 0 0 0  PHQ-9 Score 1 7 7   Difficult doing work/chores Not difficult at all Somewhat difficult Somewhat difficult    BP Readings from Last 3 Encounters:  02/14/24 126/84  07/22/23 128/76  07/13/23 (!) 157/77    Wt Readings from Last 3 Encounters:   02/14/24 183 lb 6 oz (83.2 kg)  07/22/23 177 lb 6 oz (80.5 kg)  07/11/23 176 lb (79.8 kg)    There were no vitals taken for this visit.  Physical Exam  Recent Labs     Component Value Date/Time   NA 142 02/14/2024 1552   K 3.6 02/14/2024 1552   CL 101 02/14/2024 1552   CO2 29 02/14/2024 1552   GLUCOSE 91 02/14/2024 1552   GLUCOSE 97 07/13/2023 1807   BUN 16 02/14/2024 1552   CREATININE 1.02 (H) 02/14/2024 1552   CALCIUM  9.1 02/14/2024 1552   PROT 6.3 02/14/2024 1552   ALBUMIN 4.2 02/14/2024 1552   AST 19 02/14/2024 1552   ALT 21 02/14/2024 1552   ALKPHOS 108 02/14/2024 1552   BILITOT 0.3 02/14/2024 1552   GFRNONAA >60 07/13/2023 1807   GFRAA 73 09/17/2020 0915    Lab Results  Component Value Date   WBC 6.9 02/14/2024   HGB 13.3 02/14/2024   HCT 40.5 02/14/2024   MCV 91 02/14/2024   PLT 323 02/14/2024   Lab Results  Component Value Date   HGBA1C 6.3 (H) 02/14/2024   HGBA1C 6.1 (H) 06/24/2023   HGBA1C 6.0 (H) 01/22/2022   Lab Results  Component Value Date   CHOL 154 06/24/2023   HDL 75 06/24/2023   LDLCALC 67 06/24/2023   TRIG 58 06/24/2023   CHOLHDL 2.1 06/24/2023   Lab Results  Component Value Date   TSH 1.450 02/14/2024      Assessment and Plan:  There are no diagnoses linked to this encounter.   No follow-ups on file.    Rolan Hoyle, PA-C, DMSc, Nutritionist [facility] [phone]

## 2024-09-24 ENCOUNTER — Ambulatory Visit

## 2024-09-24 ENCOUNTER — Ambulatory Visit (INDEPENDENT_AMBULATORY_CARE_PROVIDER_SITE_OTHER)

## 2024-09-24 DIAGNOSIS — M1712 Unilateral primary osteoarthritis, left knee: Secondary | ICD-10-CM

## 2024-09-24 DIAGNOSIS — M25562 Pain in left knee: Secondary | ICD-10-CM | POA: Diagnosis not present

## 2024-09-24 MED ORDER — TRIAMCINOLONE ACETONIDE 40 MG/ML IJ SUSP
40.0000 mg | INTRAMUSCULAR | Status: AC | PRN
  Administered 2024-09-24: 40 mg via INTRA_ARTICULAR

## 2024-09-24 NOTE — Progress Notes (Signed)
 Orthopaedic Surgery New Patient Visit   History of Present Illness: The patient is a 63 y.o. female seen in clinic for left knee pain.  Patient reports symptoms started around 8 years ago.  At that time, she noticed swelling over the lateral aspect of the knee.  Symptoms were very mild at onset.  However within the last few months, her symptoms have worsened.  She has developed lateral based knee pain as well as radiating pain into the proximal tibia.  Symptoms are worse when she is laying on the left side at night as well as when standing and walking.  In addition to pain, patient admits to occasional swelling in the left knee as well as instability.  She has trialed conservative treatment measures including Tylenol , ibuprofen and ice.  No other complaints on examination today.  Patient is referred by her primary care provider.  Patient occupation: IT support from [employer]  She presents with her husband today.  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  However she states she has recovered near full motor strength on that side.   Past Medical, Social and Family History: Past Medical History:  Diagnosis Date   Acute CVA (cerebrovascular accident) (HCC) 12/19/2020   Allergy 1983   Environmental allergies only   Arthritis 2000   hands, feet, knees   Asthma 2000   Intermittent persistent   Blood transfusion without reported diagnosis 1995   Childbirth   Cataract 2021   Mild   Depression 2006   Mild   Migraine    Mood change    due to menopause/ hot flashes   PONV (postoperative nausea and vomiting)    Past Surgical History:  Procedure Laterality Date   APPENDECTOMY  2004   With hysterectomy   CESAREAN SECTION     x 1   COLONOSCOPY  2012   due in 2017 for suspicious lesion   COLONOSCOPY WITH PROPOFOL  N/A 08/20/2016   Procedure: COLONOSCOPY WITH PROPOFOL ;  Surgeon: Rogelia Copping, MD;   Location: [facility];  Service: Endoscopy;  Laterality: N/A;   REPAIR ANKLE LIGAMENT     from fall   ROTATOR CUFF REPAIR Left    shoulder   TONSILLECTOMY     TOTAL ABDOMINAL HYSTERECTOMY     Allergies  Allergen Reactions   Clavulanic Acid Diarrhea   Oxycodone  Itching   Tape Rash   Current Outpatient Medications on File Prior to Visit  Medication Sig Dispense Refill   acetaminophen  (TYLENOL ) 650 MG CR tablet Take 650 mg by mouth 2 (two) times daily as needed for pain.     albuterol  (PROVENTIL ) (2.5 MG/3ML) 0.083% nebulizer solution Take 3 mLs (2.5 mg total) by nebulization every 6 (six) hours as needed for wheezing or shortness of breath. 150 mL 1   albuterol  (VENTOLIN  HFA) 108 (90 Base) MCG/ACT inhaler Inhale 1-2 puffs into the lungs every 6 (six) hours as needed. 8 g 0   aspirin  EC 81 MG EC tablet Take 1 tablet (81 mg total) by mouth daily. Swallow whole. 30 tablet 11   atorvastatin  (LIPITOR ) 40 MG tablet TAKE 1 TABLET (40 MG TOTAL) BY MOUTH DAILY. 90 tablet 3   busPIRone  (BUSPAR ) 10 MG tablet Take 10 mg by mouth 2 (two) times daily.     cetirizine (ZYRTEC) 10 MG tablet Take by mouth.     citalopram  (CELEXA ) 20 MG tablet Take  1 tablet by mouth once daily 90 tablet 0   melatonin 5 MG TABS Take 5-10 mg by mouth at bedtime as needed (sleep).     montelukast  (SINGULAIR ) 10 MG tablet Take 1 tablet by mouth every night at bedtime 90 tablet 0   Multiple Vitamins-Minerals (MULTIVITAMIN ADULTS 50+ PO) Take 1 capsule by mouth daily.     nortriptyline  (PAMELOR ) 10 MG capsule SMARTSIG:1 Capsule(s) By Mouth Every Evening 90 capsule 1   oxybutynin  (DITROPAN  XL) 5 MG 24 hr tablet Take 1 tablet (5 mg total) by mouth at bedtime. 30 tablet 5   rizatriptan  (MAXALT ) 10 MG tablet as needed.     Triamcinolone  Acetonide (NASACORT  ALLERGY 24HR NA) Place 1-2 sprays into both nostrils daily as needed (allergies).     Vitamin D , Cholecalciferol , 1000 UNITS CAPS Take 1,000 Units by mouth daily.     No  current facility-administered medications on file prior to visit.   Social History   Tobacco Use   Smoking status: Never   Smokeless tobacco: Never  Vaping Use   Vaping status: Never Used  Substance Use Topics   Alcohol use: Yes    Alcohol/week: 0.0 standard drinks of alcohol    Comment: 2 drinks - 2x/mo   Drug use: No      I have reviewed past medical, surgical, social and family history, medications and allergies as documented in the EMR.  Review of Systems - A ROS was performed including pertinent positives and negatives as documented in the HPI.     Physical Exam:  General/Constitutional: NAD Vascular: No edema, swelling or tenderness, except as noted in detailed exam Integumentary: No impressive skin lesions present, except as noted in detailed exam Neuro/Psych: Normal mood and affect, oriented to person, place and time Musculoskeletal: Normal, except as noted in detailed exam and in HPI    Left knee Examination (focused):  Skin is intact about the left knee.  No obvious deformity or ecchymosis.  There is mild swelling noted adjacent to the patella tendon along the lateral joint line.     LEFT  AROM (degrees) PROM (degrees)  0-125 0-130  Palpation (pain): Effusion none   Medial joint line tenderness negative   Lateral joint line tenderness minimal  Instability: Varus @ 0 degrees            @ 30 degrees none none   Valgus @ 0 degrees             @ 30 degrees none none  Special Tests: Lachman's negative   Posterior drawer none   Anterior drawer none   McMurray negative  Patella: Palpation (pain) positive   Grind Test positive   Mobility < 2 quadrants   Apprehension negative  Other: Knee flexion strength  5/5   Knee extension strength  5/5    Vascular/Lymphatic: Left lower extremity warm and well-perfused Neurologic: Sensation intact to light touch distally about the left lower extremity     XR left knee imaging: X-rays of the left knee  including AP, PA notch, sunrise, lateral were obtained in office today and reviewed with patient.  Per my interpretation, there are no fractures or dislocations.  Degenerative changes most notable about the patellofemoral joint with osteophyte formation and loss of joint space.  Mild medial joint space narrowing.   Assessment:  Left knee osteoarthritis  Plan:  Patient was seen and examined in office today. We reviewed patient's history, examination, and imaging in detail. Based on information available for this encounter,  patient is likely symptomatic from left knee osteoarthritis.  She has developed increasing pain over the lateral joint line within the last few months.  Radiographs suggest patellofemoral arthritis as well as on examination findings of pain exacerbation with palpation around the patellofemoral joint.  We discussed conservative treatment measures today including oral anti-inflammatories, steroid injection and formal physical therapy.  She has failed oral nonsteroidal anti-inflammatories.  She is not interested in formal physical therapy at this time.  However she would like to proceed with left knee injection today.  Risks and benefits were discussed.  Patient acknowledges and would like to proceed.  We would like to see patient back in 6 weeks for repeat evaluation.  She will follow-up sooner if needed.    Patient education material was provided.  All questions, concerns and comments were addressed to the best of my ability.  Follow-up: 6 weeks   Arlyss GEANNIE Schneider, DO Orthopedic Surgery & Sports Medicine [place]   This document was dictated using Dragon voice recognition software. A reasonable attempt at proof reading has been made to minimize errors.   Large Joint Inj: L knee on 09/24/2024 4:57 PM Indications: pain Details: (21 G) 1.5 in needle, anteromedial approach Medications: 40 mg triamcinolone  acetonide 40 MG/ML (Ropivacaine 0.5% (4cc)) Outcome: tolerated  well, no immediate complications Procedure, treatment alternatives, risks and benefits explained, specific risks discussed. Patient was prepped and draped in the usual sterile fashion.      Left knee injection procedure: The risks and benefits of a cortisone injection were discussed and the patient wishes to proceed.  The anteromedial compartment of the left knee was cleansed with alcohol swabs and ChloraPrep.  The skin was flash cooled with Ethyl Chloride, and using sterile technique, a 21 gauge needle was immediately introduced into the joint.  After aspiration revealed a flash of clear yellow tinged joint fluid the injectate which consisted of 1cc of 40 mg/mL of Kenalog  and 4cc of 0.5% ropivacaine easily flowed into the joint.  The needle was withdrawn and pressure was applied for hemostasis.  A bandage was applied.  The patient tolerated the procedure well. Patient instructed to ice the area tonight if sore.  Cortisone Injections You have received a cortisone injection today. This injection consists of a numbing medication and cortisone.  There may be side effects after receiving the injection.   If you are diabetic: Your blood sugar may increase for up to 48 hours.  Check it more often than normal.   General reactions: A cortisone flare reaction. This generally occurs 6-8 hours after receiving the injection.  Ice the area and take Tylenol  for pain.  If the pain lasts longer than 48 hours call the office.   Some patients may experience flushing, increased heart rate, red face or increase in body temperature.  This is rare but can happen.   Over the counter Benadryl , if appropriate, often reduces these symptoms.  For a severe reaction contact your family doctor or go to the emergency room.  If you have any other questions, feel free to ask.

## 2024-09-24 NOTE — Patient Instructions (Addendum)
 [facility] [address], [place] [address] [phone]   Thank you for visiting the office today. We appreciate your trust and allowing us  to help you with your orthopedic needs.  Please do not hesitate to call if you have further questions or concerns following your visit with us . If you experience life-threatening symptoms or it cannot wait until normal office hours, please go to the nearest Emergency Department for immediate evaluation.      Cortisone Injection Patient Information  -A cortisone injection has been recommended for you today during your office visit. The injection consists of two medications administered at the same time. The first medication is a numbing medication. This medication will help you to feel better today for a couple hours.  However, this medication will wear off today and your discomfort may return. The second medication is the steroid medication. This medication will usually take a few days for full effect.   -Some individuals, about 1 in 20, can have an increase in their pain after the injection for approximately 24-36 hours. This is called a steroid flare and may be associated with some slight flushing of the face. The best thing to do if this occurs is to ice the area as much as possible and take ibuprofen if you are able.   -Any patients who are currently receiving treatment for problems with their blood sugar should be aware that this medication may cause you to have elevated blood sugar. This will typically occur for 24-36 hours after the injection. We recommend that you check your blood sugar regularly during this time and contact your primary care provider immediately or go to the nearest emergency room if the levels get to high.

## 2024-10-01 ENCOUNTER — Other Ambulatory Visit: Payer: Self-pay | Admitting: Internal Medicine

## 2024-10-01 ENCOUNTER — Encounter: Payer: Self-pay | Admitting: Radiology

## 2024-10-01 DIAGNOSIS — Z1231 Encounter for screening mammogram for malignant neoplasm of breast: Secondary | ICD-10-CM

## 2024-10-25 ENCOUNTER — Other Ambulatory Visit: Payer: Self-pay | Admitting: Internal Medicine

## 2024-10-25 DIAGNOSIS — N3281 Overactive bladder: Secondary | ICD-10-CM

## 2024-10-30 NOTE — Telephone Encounter (Signed)
 Requested by interface surescripts. No future visit .  Requested Prescriptions  Pending Prescriptions Disp Refills   oxybutynin  (DITROPAN -XL) 5 MG 24 hr tablet [Pharmacy Med Name: Oxybutynin  Chloride 5mg  Extended-Release Tablet] 30 tablet 5    Sig: Take 1 tablet by mouth once daily at bedtime.     Urology:  Bladder Agents Passed - 10/30/2024  1:32 PM      Passed - Valid encounter within last 12 months    Recent Outpatient Visits           1 month ago Synovial cyst of left knee   [facility] at [facility], Leita DEL, MD   8 months ago Prediabetes   [facility] at [facility], Leita DEL, MD       Future Appointments             In 1 week Gust Molly, DO [facility]

## 2024-11-01 ENCOUNTER — Ambulatory Visit
Admission: RE | Admit: 2024-11-01 | Discharge: 2024-11-01 | Disposition: A | Source: Ambulatory Visit | Attending: Internal Medicine | Admitting: Internal Medicine

## 2024-11-01 DIAGNOSIS — Z1231 Encounter for screening mammogram for malignant neoplasm of breast: Secondary | ICD-10-CM | POA: Diagnosis present

## 2024-11-04 ENCOUNTER — Other Ambulatory Visit: Payer: Self-pay | Admitting: Internal Medicine

## 2024-11-04 DIAGNOSIS — F39 Unspecified mood [affective] disorder: Secondary | ICD-10-CM

## 2024-11-05 ENCOUNTER — Ambulatory Visit

## 2024-11-06 NOTE — Telephone Encounter (Signed)
/  Requested medications are due for refill today.  Yes/  Requested medications are on the active medications list.  yes  Last refill. 08/13/2024 #90 0 rf  Future visit scheduled.   no  Notes to clinic.  Please review for refill.    Requested Prescriptions  Pending Prescriptions Disp Refills   citalopram  (CELEXA ) 20 MG tablet [Pharmacy Med Name: Citalopram  Hydrobromide 20 MG Oral Tablet] 90 tablet 0    Sig: Take 1 tablet by mouth once daily     Psychiatry:  Antidepressants - SSRI Failed - 11/06/2024  2:32 PM      Failed - Valid encounter within last 6 months    Recent Outpatient Visits           1 month ago Synovial cyst of left knee   [facility] at [facility], Leita DEL, MD   8 months ago Prediabetes   [facility] at [facility], Leita DEL, MD       Future Appointments             Tomorrow Gust Molly, DO [facility]

## 2024-11-07 ENCOUNTER — Ambulatory Visit (INDEPENDENT_AMBULATORY_CARE_PROVIDER_SITE_OTHER)

## 2024-11-07 DIAGNOSIS — M25562 Pain in left knee: Secondary | ICD-10-CM | POA: Diagnosis not present

## 2024-11-07 DIAGNOSIS — M1712 Unilateral primary osteoarthritis, left knee: Secondary | ICD-10-CM | POA: Diagnosis not present

## 2024-11-07 NOTE — Patient Instructions (Signed)

## 2024-11-07 NOTE — Progress Notes (Signed)
 Orthopedic Follow-Up Note  Left knee patellofemoral osteoarthritis    SUBJECTIVE:   [name] is a 63 y.o. year old who presents for follow up of left knee pain.  Patient was last seen in office on 09/24/2024 where she was found to have left knee patellofemoral arthritis.  At that time, treatment options offered were formal physical therapy and intra-articular steroid injection.  She deferred formal physical therapy but was interested in steroid injection.  She reports since last encounter, she experienced 3 weeks of 100% relief of her left knee pain and swelling.  However, after 3 weeks her symptoms of pain within the knee slowly returned.  She also developed shooting pain from her knee down into her foot.  She was seen by neurology who prescribed gabapentin for suspected neuropathy.  She reports symptoms have improved with gabapentin.  She was also recommended to obtain an EMG per neurology which she will be completing in the near future.  Patient has been taking ibuprofen when symptoms flareup and it has been manageable.  Denies new symptoms today.     Past Medical History:  Diagnosis Date   Acute CVA (cerebrovascular accident) (HCC) 12/19/2020   Allergy 1983   Environmental allergies only   Arthritis 2000   hands, feet, knees   Asthma 2000   Intermittent persistent   Blood transfusion without reported diagnosis 1995   Childbirth   Cataract 2021   Mild   Depression 2006   Mild   Migraine    Mood change    due to menopause/ hot flashes   PONV (postoperative nausea and vomiting)    Past Surgical History:  Procedure Laterality Date   APPENDECTOMY  2004   With hysterectomy   CESAREAN SECTION     x 1   COLONOSCOPY  2012   due in 2017 for suspicious lesion   COLONOSCOPY WITH PROPOFOL  N/A 08/20/2016   Procedure: COLONOSCOPY WITH PROPOFOL ;  Surgeon: Rogelia Copping, MD;  Location: [facility];  Service: Endoscopy;  Laterality: N/A;   REPAIR ANKLE LIGAMENT     from fall    ROTATOR CUFF REPAIR Left    shoulder   TONSILLECTOMY     TOTAL ABDOMINAL HYSTERECTOMY      Current Outpatient Medications:    acetaminophen  (TYLENOL ) 650 MG CR tablet, Take 650 mg by mouth 2 (two) times daily as needed for pain., Disp: , Rfl:    albuterol  (PROVENTIL ) (2.5 MG/3ML) 0.083% nebulizer solution, Take 3 mLs (2.5 mg total) by nebulization every 6 (six) hours as needed for wheezing or shortness of breath., Disp: 150 mL, Rfl: 1   albuterol  (VENTOLIN  HFA) 108 (90 Base) MCG/ACT inhaler, Inhale 1-2 puffs into the lungs every 6 (six) hours as needed., Disp: 8 g, Rfl: 0   aspirin  EC 81 MG EC tablet, Take 1 tablet (81 mg total) by mouth daily. Swallow whole., Disp: 30 tablet, Rfl: 11   atorvastatin  (LIPITOR ) 40 MG tablet, TAKE 1 TABLET (40 MG TOTAL) BY MOUTH DAILY., Disp: 90 tablet, Rfl: 3   busPIRone  (BUSPAR ) 10 MG tablet, Take 10 mg by mouth 2 (two) times daily., Disp: , Rfl:    cetirizine (ZYRTEC) 10 MG tablet, Take by mouth., Disp: , Rfl:    citalopram  (CELEXA ) 20 MG tablet, Take 1 tablet by mouth once daily, Disp: 90 tablet, Rfl: 0   melatonin 5 MG TABS, Take 5-10 mg by mouth at bedtime as needed (sleep)., Disp: , Rfl:    montelukast  (SINGULAIR ) 10 MG tablet, Take 1  tablet by mouth every night at bedtime, Disp: 90 tablet, Rfl: 0   Multiple Vitamins-Minerals (MULTIVITAMIN ADULTS 50+ PO), Take 1 capsule by mouth daily., Disp: , Rfl:    nortriptyline  (PAMELOR ) 10 MG capsule, SMARTSIG:1 Capsule(s) By Mouth Every Evening, Disp: 90 capsule, Rfl: 1   oxybutynin  (DITROPAN -XL) 5 MG 24 hr tablet, Take 1 tablet by mouth once daily at bedtime., Disp: 30 tablet, Rfl: 5   rizatriptan  (MAXALT ) 10 MG tablet, as needed., Disp: , Rfl:    Triamcinolone  Acetonide (NASACORT  ALLERGY 24HR NA), Place 1-2 sprays into both nostrils daily as needed (allergies)., Disp: , Rfl:    Vitamin D , Cholecalciferol , 1000 UNITS CAPS, Take 1,000 Units by mouth daily., Disp: , Rfl:  Allergies  Allergen Reactions    Clavulanic Acid Diarrhea   Oxycodone  Itching   Tape Rash   Social History   Socioeconomic History   Marital status: Married    Spouse name: Not on file   Number of children: Not on file   Years of education: Not on file   Highest education level: Bachelor's degree (e.g., BA, AB, BS)  Occupational History   Not on file  Tobacco Use   Smoking status: Never   Smokeless tobacco: Never  Vaping Use   Vaping status: Never Used  Substance and Sexual Activity   Alcohol use: Yes    Alcohol/week: 0.0 standard drinks of alcohol    Comment: 2 drinks - 2x/mo   Drug use: No   Sexual activity: Not Currently  Other Topics Concern   Not on file  Social History Narrative   Not on file   Social Drivers of Health   Financial Resource Strain: Low Risk  (07/15/2023)   Overall Financial Resource Strain (CARDIA)    Difficulty of Paying Living Expenses: Not very hard  Food Insecurity: No Food Insecurity (07/15/2023)   Hunger Vital Sign    Worried About Running Out of Food in the Last Year: Never true    Ran Out of Food in the Last Year: Never true  Transportation Needs: No Transportation Needs (07/15/2023)   PRAPARE - Administrator, Civil Service (Medical): No    Lack of Transportation (Non-Medical): No  Physical Activity: Insufficiently Active (07/15/2023)   Exercise Vital Sign    Days of Exercise per Week: 5 days    Minutes of Exercise per Session: 10 min  Stress: No Stress Concern Present (07/15/2023)   [employer] of Occupational Health - Occupational Stress Questionnaire    Feeling of Stress : Only a little  Social Connections: Socially Integrated (07/15/2023)   Social Connection and Isolation Panel    Frequency of Communication with Friends and Family: More than three times a week    Frequency of Social Gatherings with Friends and Family: Once a week    Attends Religious Services: More than 4 times per year    Active Member of [employer] or Organizations: Yes    Attends  Engineer, Structural: More than 4 times per year    Marital Status: Married  Catering Manager Violence: Not At Risk (09/20/2024)   Humiliation, Afraid, Rape, and Kick questionnaire    Fear of Current or Ex-Partner: No    Emotionally Abused: No    Physically Abused: No    Sexually Abused: No   Family History  Problem Relation Age of Onset   Early death Mother    Varicose Veins Mother    Diabetes Paternal Uncle    Obesity Paternal Uncle  Breast cancer Paternal Grandmother 38   Arthritis Paternal Grandmother    Hearing loss Paternal Grandmother    Hyperlipidemia Paternal Grandmother    Hypertension Paternal Grandmother    CAD Father    Alcohol abuse Father    Anxiety disorder Father    Depression Father    Hearing loss Father    Heart disease Father    Hypertension Father    Obesity Father    Heart disease Maternal Grandfather    Obesity Maternal Grandfather    Anxiety disorder Maternal Grandmother    Arthritis Maternal Grandmother    Hearing loss Maternal Grandmother    Heart disease Maternal Grandmother    Hyperlipidemia Maternal Grandmother    Miscarriages / Stillbirths Maternal Grandmother    Obesity Maternal Grandmother    Stroke Maternal Grandmother    Varicose Veins Maternal Grandmother    Cancer Paternal Grandfather    Depression Paternal Grandfather    Heart disease Paternal Grandfather    Hypertension Paternal Grandfather    Cancer Maternal Uncle      ROS: A review of systems was performed and is negative unless stated above in HPI    OBJECTIVE:    Constitutional:   The patient is alert and oriented x 3, appears to be stated age and in no distress.   Left knee Examination (focused):   Skin is intact about the left knee.  No obvious deformity or ecchymosis.  There is mild swelling noted adjacent to the patella tendon along the lateral joint line -improved from prior examination.        LEFT  AROM (degrees) PROM (degrees)   0-130 0-130   Palpation (pain): Effusion none    Medial joint line tenderness negative    Lateral joint line tenderness negative  Instability: Varus @ 0 degrees            @ 30 degrees none none    Valgus @ 0 degrees             @ 30 degrees none none  Special Tests: Lachman's negative    Posterior drawer none    Anterior drawer none    McMurray negative  Patella: Palpation (pain) positive    Grind Test positive    Mobility < 2 quadrants    Apprehension negative  Other: Knee flexion strength  5/5    Knee extension strength  5/5      Vascular/Lymphatic: Left lower extremity warm and well-perfused Neurologic: Sensation intact to light touch distally about the left lower extremity   IMAGING:   No new imaging available for review today.     ASSESSMENT:  Left knee patellofemoral arthritis - stable     PLAN:   Patient was seen in office today for follow up of left knee patellofemoral arthritis. Since last encounter, patient reports 100% improvement in her symptoms for 3-week period after receiving intra-articular knee injection. Interventions attempted include steroid injection.  Symptoms have since returned.  We discussed other treatment modalities with patient today.  She is interested in formal physical therapy at this time.  Referral has been placed.  Notified patient that steroid injections cannot be repeated any sooner than 40-month intervals.  She will plan on taking nonsteroidal anti-inflammatories as needed for symptom control.  Patient will follow-up in office in 8 weeks after the completion of formal physical therapy.  She will notify the office sooner if any new or worsening symptoms.  Her neuropathic symptoms in the left lower extremity are being managed  by neurology at this time.   All questions and concerns were answered to the best of my ability.  Patient can call any time with further concerns.  I discussed with the patient today that I will be transitioning out of my role  within the near future. In order to provide appropriate continuity of care, we offered the patient options for follow up regarding their orthopedic concerns. Patient has chosen to follow up with [facility].  Patient may reach out to our office if there are any difficulties in scheduling follow up care. The patient understands who to contact for future orthopedic concerns and has contact information for the receiving practice.   Arlyss Schneider, DO Orthopedic Surgery & Sports Medicine [facility]

## 2024-11-13 ENCOUNTER — Other Ambulatory Visit: Payer: Self-pay | Admitting: Internal Medicine

## 2024-11-13 DIAGNOSIS — F39 Unspecified mood [affective] disorder: Secondary | ICD-10-CM

## 2024-11-15 NOTE — Telephone Encounter (Signed)
 Requested by interface surescripts. Receipt confirmed by pharmacy 11/06/24 at 2:40 pm. Attempted to contact pharmacy and unable to connect with staff to confirm pick up of medication. Duplicate request.  Requested Prescriptions  Refused Prescriptions Disp Refills   citalopram  (CELEXA ) 20 MG tablet [Pharmacy Med Name: Citalopram  Hydrobromide 20 MG Oral Tablet] 90 tablet 0    Sig: Take 1 tablet by mouth once daily     Psychiatry:  Antidepressants - SSRI Failed - 11/15/2024  3:35 PM      Failed - Valid encounter within last 6 months    Recent Outpatient Visits           1 month ago Synovial cyst of left knee   [facility] at [facility], Leita DEL, MD   9 months ago Prediabetes   [facility] at [facility], Leita DEL, MD

## 2024-11-15 NOTE — Telephone Encounter (Signed)
 Attempted to contact pharmacy staff at [facility] pharmacy to verify patient picked up Rx receipt confirmed 11/06/24 at 2:40 pm. After multiple rings no answer, disconnected call.

## 2024-12-03 ENCOUNTER — Other Ambulatory Visit: Payer: Self-pay | Admitting: Internal Medicine

## 2024-12-04 NOTE — Telephone Encounter (Signed)
 Requested medications are due for refill today.  no  Requested medications are on the active medications list.  yes  Last refill. 02/24/2024 #90 3 rf  Future visit scheduled.   no  Notes to clinic.  Dr. Justus listed a pcp.     Requested Prescriptions  Pending Prescriptions Disp Refills   atorvastatin  (LIPITOR ) 40 MG tablet [Pharmacy Med Name: ATORVASTATIN  40 MG TABLET] 90 tablet 3    Sig: TAKE 1 TABLET BY MOUTH EVERY DAY     Cardiovascular:  Antilipid - Statins Failed - 12/04/2024  2:26 PM      Failed - Lipid Panel in normal range within the last 12 months    Cholesterol, Total  Date Value Ref Range Status  06/24/2023 154 100 - 199 mg/dL Final   LDL Chol Calc (NIH)  Date Value Ref Range Status  06/24/2023 67 0 - 99 mg/dL Final   HDL  Date Value Ref Range Status  06/24/2023 75 >39 mg/dL Final   Triglycerides  Date Value Ref Range Status  06/24/2023 58 0 - 149 mg/dL Final         Passed - Patient is not pregnant      Passed - Valid encounter within last 12 months    Recent Outpatient Visits           2 months ago Synovial cyst of left knee   [place] [facility] at [facility], Leita DEL, MD   9 months ago Prediabetes   [facility] at [facility], Leita DEL, MD
